# Patient Record
Sex: Female | Born: 1990
Health system: Southern US, Community
[De-identification: ages and names within clinical notes are randomized; demographics above are authoritative.]

## PROBLEM LIST (undated history)

## (undated) ENCOUNTER — Inpatient Hospital Stay (HOSPITAL_COMMUNITY): Payer: Self-pay

## (undated) ENCOUNTER — Ambulatory Visit (HOSPITAL_COMMUNITY): Admission: EM | Discharge status: Absent without leave | Payer: PRIVATE HEALTH INSURANCE

## (undated) DIAGNOSIS — F419 Anxiety disorder, unspecified: Secondary | ICD-10-CM

## (undated) DIAGNOSIS — R519 Headache, unspecified: Secondary | ICD-10-CM

## (undated) DIAGNOSIS — O1490 Unspecified pre-eclampsia, unspecified trimester: Secondary | ICD-10-CM

## (undated) DIAGNOSIS — K589 Irritable bowel syndrome without diarrhea: Secondary | ICD-10-CM

## (undated) DIAGNOSIS — F319 Bipolar disorder, unspecified: Secondary | ICD-10-CM

## (undated) DIAGNOSIS — F410 Panic disorder [episodic paroxysmal anxiety] without agoraphobia: Secondary | ICD-10-CM

## (undated) DIAGNOSIS — F32A Depression, unspecified: Secondary | ICD-10-CM

## (undated) DIAGNOSIS — R51 Headache: Secondary | ICD-10-CM

## (undated) DIAGNOSIS — F329 Major depressive disorder, single episode, unspecified: Secondary | ICD-10-CM

## (undated) DIAGNOSIS — F4310 Post-traumatic stress disorder, unspecified: Secondary | ICD-10-CM

## (undated) HISTORY — DX: Headache: R51

## (undated) HISTORY — DX: Bipolar disorder, unspecified: F31.9

## (undated) HISTORY — DX: Post-traumatic stress disorder, unspecified: F43.10

## (undated) HISTORY — DX: Headache, unspecified: R51.9

## (undated) HISTORY — PX: EYE SURGERY: SHX253

## (undated) HISTORY — PX: TENDON REPAIR: SHX5111

---
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
## 2011-02-27 ENCOUNTER — Other Ambulatory Visit (HOSPITAL_COMMUNITY): Payer: Self-pay | Admitting: Family Medicine

## 2011-02-27 ENCOUNTER — Ambulatory Visit (HOSPITAL_COMMUNITY)
Admission: RE | Admit: 2011-02-27 | Discharge: 2011-02-27 | Disposition: A | Discharge status: Home / Self Care | Payer: BC Managed Care – PPO | Source: Ambulatory Visit | Attending: Family Medicine | Admitting: Family Medicine

## 2011-02-27 DIAGNOSIS — X58XXXA Exposure to other specified factors, initial encounter: Secondary | ICD-10-CM | POA: Insufficient documentation

## 2011-02-27 DIAGNOSIS — S8000XA Contusion of unspecified knee, initial encounter: Secondary | ICD-10-CM

## 2011-02-27 DIAGNOSIS — M25569 Pain in unspecified knee: Secondary | ICD-10-CM | POA: Insufficient documentation

## 2011-02-27 DIAGNOSIS — IMO0002 Reserved for concepts with insufficient information to code with codable children: Secondary | ICD-10-CM

## 2011-06-07 LAB — URINALYSIS, ROUTINE W REFLEX MICROSCOPIC
Bilirubin Urine: NEGATIVE
Glucose, UA: NEGATIVE
Hgb urine dipstick: NEGATIVE
Ketones, ur: NEGATIVE
Nitrite: NEGATIVE
Protein, ur: NEGATIVE
Specific Gravity, Urine: 1.01
Urobilinogen, UA: 0.2
pH: 7

## 2011-06-07 LAB — BASIC METABOLIC PANEL
BUN: 8
CO2: 26
Calcium: 9.2
Chloride: 106
Creatinine, Ser: 0.73
Glucose, Bld: 93
Potassium: 3.7
Sodium: 135

## 2011-06-07 LAB — DIFFERENTIAL
Basophils Absolute: 0
Basophils Relative: 1
Eosinophils Absolute: 0.1
Eosinophils Relative: 1
Lymphocytes Relative: 40
Lymphs Abs: 2.7
Monocytes Absolute: 0.5
Monocytes Relative: 8
Neutro Abs: 3.3
Neutrophils Relative %: 50

## 2011-06-07 LAB — URINE MICROSCOPIC-ADD ON

## 2011-06-07 LAB — CBC
HCT: 39.7
Hemoglobin: 14.1
MCHC: 35.6
MCV: 86.6
Platelets: 193
RBC: 4.58
RDW: 12.5
WBC: 6.7

## 2011-06-07 LAB — PREGNANCY, URINE: Preg Test, Ur: NEGATIVE

## 2011-08-08 ENCOUNTER — Encounter (HOSPITAL_COMMUNITY): Payer: Self-pay | Admitting: *Deleted

## 2011-08-08 DIAGNOSIS — H33002 Unspecified retinal detachment with retinal break, left eye: Secondary | ICD-10-CM | POA: Insufficient documentation

## 2011-08-08 NOTE — H&P (Addendum)
PORTIA WISDOM is an 20 y.o. female.   Chief Complaint: VISUAL FIELD LOSS INFERONASAL OS, OVR THE LAST WEEK  HPI: HX OF HIGH MYOPIA, VISION LOSS OS, WITH LOCAL RETINAL DETACHMENT SUPEROTEMPORALLY OS  Past Medical History  Diagnosis Date  . Asthma   . IBS (irritable bowel syndrome)   . Anxiety     Past Surgical History  Procedure Date  . Tendon repair     History reviewed. No pertinent family history. Social History:  reports that she has never smoked. She does not have any smokeless tobacco history on file. She reports that she does not drink alcohol or use illicit drugs.  Allergies:  Allergies  Allergen Reactions  . Sulfa Antibiotics Swelling    No current facility-administered medications on file as of .   No current outpatient prescriptions on file as of .    No results found for this or any previous visit (from the past 48 hour(s)). No results found.  Review of Systems  Constitutional: Negative.   HENT: Negative.   Eyes: Negative.   Skin: Negative.   All other systems reviewed and are negative.    Last menstrual period 08/08/2011. Physical Exam  Eyes: Conjunctivae, EOM and lids are normal. Pupils are equal, round, and reactive to light.         20/20 OU   WITH SUPEROTEMPORAL LOCAL RD OS  Assessment/Plan REPAIR  OF RETINAL DETACHMENT OS, VIA SCLERAL BUCKLE AND CRYOPEXY UNDER GENERAL ANESTHESIA  Lawanna Cecere A 08/08/2011, 10:11 PM   Pt examined, and no changes

## 2011-08-09 ENCOUNTER — Ambulatory Visit (HOSPITAL_COMMUNITY): Payer: BC Managed Care – PPO | Admitting: Certified Registered"

## 2011-08-09 ENCOUNTER — Encounter (HOSPITAL_COMMUNITY): Payer: Self-pay | Admitting: Certified Registered"

## 2011-08-09 ENCOUNTER — Ambulatory Visit (HOSPITAL_COMMUNITY)
Admission: RE | Admit: 2011-08-09 | Discharge: 2011-08-09 | Disposition: A | Discharge status: Home / Self Care | Payer: BC Managed Care – PPO | Source: Ambulatory Visit | Attending: Ophthalmology | Admitting: Ophthalmology

## 2011-08-09 ENCOUNTER — Encounter (HOSPITAL_COMMUNITY): Payer: Self-pay | Admitting: *Deleted

## 2011-08-09 ENCOUNTER — Ambulatory Visit (HOSPITAL_COMMUNITY): Payer: BC Managed Care – PPO

## 2011-08-09 ENCOUNTER — Encounter (HOSPITAL_COMMUNITY)
Admission: RE | Disposition: A | Discharge status: Home / Self Care | Payer: Self-pay | Source: Ambulatory Visit | Attending: Ophthalmology

## 2011-08-09 DIAGNOSIS — H33009 Unspecified retinal detachment with retinal break, unspecified eye: Secondary | ICD-10-CM | POA: Insufficient documentation

## 2011-08-09 HISTORY — DX: Irritable bowel syndrome, unspecified: K58.9

## 2011-08-09 HISTORY — PX: SCLERAL BUCKLE: SHX5340

## 2011-08-09 HISTORY — DX: Anxiety disorder, unspecified: F41.9

## 2011-08-09 LAB — CBC
HCT: 39.4 % (ref 36.0–46.0)
Hemoglobin: 13.8 g/dL (ref 12.0–15.0)
MCH: 30.3 pg (ref 26.0–34.0)
MCHC: 35 g/dL (ref 30.0–36.0)
MCV: 86.6 fL (ref 78.0–100.0)
Platelets: 200 10*3/uL (ref 150–400)
RBC: 4.55 MIL/uL (ref 3.87–5.11)
RDW: 12.8 % (ref 11.5–15.5)
WBC: 6.6 10*3/uL (ref 4.0–10.5)

## 2011-08-09 LAB — BASIC METABOLIC PANEL
BUN: 7 mg/dL (ref 6–23)
CO2: 25 meq/L (ref 19–32)
Calcium: 9.7 mg/dL (ref 8.4–10.5)
Chloride: 103 meq/L (ref 96–112)
Creatinine, Ser: 0.65 mg/dL (ref 0.50–1.10)
GFR calc Af Amer: >90 mL/min (ref >90)
GFR calc non Af Amer: >90 mL/min (ref >90)
Glucose, Bld: 89 mg/dL (ref 70–99)
Potassium: 4.4 meq/L (ref 3.5–5.1)
Sodium: 139 mEq/L (ref 135–145)

## 2011-08-09 LAB — SURGICAL PCR SCREEN
MRSA, PCR: NEGATIVE
Staphylococcus aureus: NEGATIVE

## 2011-08-09 LAB — HCG, SERUM, QUALITATIVE: Preg, Serum: NEGATIVE

## 2011-08-09 SURGERY — SCLERAL BUCKLE
Anesthesia: General | Site: Eye | Laterality: Left | Wound class: Clean

## 2011-08-09 MED ORDER — GATIFLOXACIN 0.5 % OP SOLN: 1.0000 [drp] | Freq: Four times a day (QID) | OPHTHALMIC | Status: DC

## 2011-08-09 MED ORDER — GENTAMICIN SULFATE 40 MG/ML IJ SOLN
INTRAMUSCULAR | Status: DC | PRN
  Administered 2011-08-09: 80 mg

## 2011-08-09 MED ORDER — SODIUM CHLORIDE BACTERIOSTATIC 0.9 % IJ SOLN
INTRAMUSCULAR | Status: DC | PRN
  Administered 2011-08-09: 10 mL

## 2011-08-09 MED ORDER — ROCURONIUM BROMIDE 100 MG/10ML IV SOLN
INTRAVENOUS | Status: DC | PRN
  Administered 2011-08-09: 40 mg via INTRAVENOUS

## 2011-08-09 MED ORDER — GLYCOPYRROLATE 0.2 MG/ML IJ SOLN
INTRAMUSCULAR | Status: DC | PRN
  Administered 2011-08-09: .6 mg via INTRAVENOUS

## 2011-08-09 MED ORDER — FENTANYL CITRATE 0.05 MG/ML IJ SOLN
50.0000 ug | Freq: Once | INTRAMUSCULAR | Status: AC
  Administered 2011-08-09: 50 ug via INTRAVENOUS

## 2011-08-09 MED ORDER — CEFAZOLIN SODIUM 1-5 GM-% IV SOLN
INTRAVENOUS | Status: DC | PRN
  Administered 2011-08-09: 1 g via INTRAVENOUS

## 2011-08-09 MED ORDER — NEOSTIGMINE METHYLSULFATE 1 MG/ML IJ SOLN
INTRAMUSCULAR | Status: DC | PRN
  Administered 2011-08-09: 4 mg via INTRAVENOUS

## 2011-08-09 MED ORDER — CYCLOPENTOLATE HCL 1 % OP SOLN
OPHTHALMIC | Status: AC
  Filled 2011-08-09: qty 2

## 2011-08-09 MED ORDER — GATIFLOXACIN 0.5 % OP SOLN
OPHTHALMIC | Status: AC
  Administered 2011-08-09: 1 [drp] via OPHTHALMIC
  Filled 2011-08-09: qty 2.5

## 2011-08-09 MED ORDER — PHENYLEPHRINE HCL 2.5 % OP SOLN
1.0000 [drp] | OPHTHALMIC | Status: AC
  Administered 2011-08-09 (×3): 1 [drp] via OPHTHALMIC

## 2011-08-09 MED ORDER — ACETAMINOPHEN 325 MG PO TABS: 325.0000 mg | ORAL_TABLET | ORAL | Status: DC | PRN

## 2011-08-09 MED ORDER — POLYMYXIN B SULFATE 500000 UNITS IJ SOLR
INTRAMUSCULAR | Status: DC | PRN
  Administered 2011-08-09: 500000 [IU]

## 2011-08-09 MED ORDER — METOCLOPRAMIDE HCL 5 MG/ML IJ SOLN
10.0000 mg | Freq: Once | INTRAMUSCULAR | Status: AC
  Administered 2011-08-09: 10 mg via INTRAVENOUS
  Filled 2011-08-09: qty 2

## 2011-08-09 MED ORDER — HYDROMORPHONE HCL PF 1 MG/ML IJ SOLN
0.2500 mg | INTRAMUSCULAR | Status: DC | PRN
  Administered 2011-08-09 (×2): 0.5 mg via INTRAVENOUS

## 2011-08-09 MED ORDER — ONDANSETRON HCL 4 MG/2ML IJ SOLN
INTRAMUSCULAR | Status: AC
  Administered 2011-08-09: 4 mg via INTRAVENOUS
  Filled 2011-08-09: qty 2

## 2011-08-09 MED ORDER — DEXAMETHASONE SODIUM PHOSPHATE 10 MG/ML IJ SOLN
INTRAMUSCULAR | Status: DC | PRN
  Administered 2011-08-09: 10 mg

## 2011-08-09 MED ORDER — MUPIROCIN 2 % EX OINT
TOPICAL_OINTMENT | CUTANEOUS | Status: AC
  Administered 2011-08-09: 1 via NASAL
  Filled 2011-08-09: qty 22

## 2011-08-09 MED ORDER — SODIUM CHLORIDE 0.9 % IV SOLN
INTRAVENOUS | Status: DC | PRN
  Administered 2011-08-09 (×2): via INTRAVENOUS

## 2011-08-09 MED ORDER — MUPIROCIN 2 % EX OINT
TOPICAL_OINTMENT | CUTANEOUS | Status: AC
  Filled 2011-08-09: qty 22

## 2011-08-09 MED ORDER — PROPOFOL 10 MG/ML IV EMUL
INTRAVENOUS | Status: DC | PRN
  Administered 2011-08-09: 200 mg via INTRAVENOUS

## 2011-08-09 MED ORDER — GATIFLOXACIN 0.5 % OP SOLN
1.0000 [drp] | OPHTHALMIC | Status: AC
  Administered 2011-08-09 (×3): 1 [drp] via OPHTHALMIC

## 2011-08-09 MED ORDER — ONDANSETRON HCL 4 MG/2ML IJ SOLN
INTRAMUSCULAR | Status: DC | PRN
  Administered 2011-08-09: 4 mg via INTRAVENOUS

## 2011-08-09 MED ORDER — CYCLOPENTOLATE HCL 1 % OP SOLN
1.0000 [drp] | OPHTHALMIC | Status: AC
  Administered 2011-08-09 (×3): 1 [drp] via OPHTHALMIC

## 2011-08-09 MED ORDER — MIDAZOLAM HCL 5 MG/5ML IJ SOLN
INTRAMUSCULAR | Status: DC | PRN
  Administered 2011-08-09: 2 mg via INTRAVENOUS

## 2011-08-09 MED ORDER — SODIUM CHLORIDE 0.9 % IV SOLN
INTRAVENOUS | Status: DC
  Administered 2011-08-09: 14:00:00 via INTRAVENOUS

## 2011-08-09 MED ORDER — FENTANYL CITRATE 0.05 MG/ML IJ SOLN
INTRAMUSCULAR | Status: DC | PRN
  Administered 2011-08-09: 150 ug via INTRAVENOUS
  Administered 2011-08-09: 25 ug via INTRAVENOUS

## 2011-08-09 MED ORDER — PREDNISOLONE ACETATE 1 % OP SUSP: 1.0000 [drp] | Freq: Four times a day (QID) | OPHTHALMIC | Status: DC

## 2011-08-09 MED ORDER — HYDROMORPHONE HCL PF 1 MG/ML IJ SOLN
INTRAMUSCULAR | Status: AC
  Filled 2011-08-09: qty 1

## 2011-08-09 MED ORDER — BSS IO SOLN
INTRAOCULAR | Status: DC | PRN
  Administered 2011-08-09: 15 mL via INTRAOCULAR

## 2011-08-09 MED ORDER — HYPROMELLOSE (GONIOSCOPIC) 2.5 % OP SOLN
OPHTHALMIC | Status: DC | PRN
  Administered 2011-08-09: 1 [drp] via OPHTHALMIC

## 2011-08-09 MED ORDER — ONDANSETRON HCL 4 MG/2ML IJ SOLN
4.0000 mg | Freq: Once | INTRAMUSCULAR | Status: AC | PRN
  Administered 2011-08-09: 4 mg via INTRAVENOUS

## 2011-08-09 MED ORDER — CEFAZOLIN SODIUM 1-5 GM-% IV SOLN
INTRAVENOUS | Status: AC
  Filled 2011-08-09: qty 50

## 2011-08-09 MED ORDER — PHENYLEPHRINE HCL 2.5 % OP SOLN
OPHTHALMIC | Status: AC
  Administered 2011-08-09: 1 [drp] via OPHTHALMIC
  Filled 2011-08-09: qty 3

## 2011-08-09 MED ORDER — LACTATED RINGERS IV SOLN
INTRAVENOUS | Status: DC | PRN
  Administered 2011-08-09: 14:00:00 via INTRAVENOUS

## 2011-08-09 MED ORDER — MUPIROCIN 2 % EX OINT
TOPICAL_OINTMENT | Freq: Two times a day (BID) | CUTANEOUS | Status: DC
  Administered 2011-08-09: 1 via NASAL

## 2011-08-09 MED ORDER — KETOROLAC TROMETHAMINE 15 MG/ML IJ SOLN
15.0000 mg | Freq: Once | INTRAMUSCULAR | Status: AC
  Administered 2011-08-09: 15 mg via INTRAVENOUS

## 2011-08-09 MED ORDER — KETOROLAC TROMETHAMINE 30 MG/ML IJ SOLN
INTRAMUSCULAR | Status: AC
  Filled 2011-08-09: qty 1

## 2011-08-09 SURGICAL SUPPLY — 52 items
APPLICATOR COTTON TIP 6IN STRL (MISCELLANEOUS) ×2 IMPLANT
APPLICATOR DR MATTHEWS STRL (MISCELLANEOUS) ×2 IMPLANT
BAND SCLERAL BUCKLING TYPE 240 (Ophthalmic Related) ×2 IMPLANT
CANNULA ANT CHAM MAIN (OPHTHALMIC RELATED) IMPLANT
CORDS BIPOLAR (ELECTRODE) IMPLANT
COVER SURGICAL LIGHT HANDLE (MISCELLANEOUS) ×2 IMPLANT
DRAPE OPHTHALMIC 77X100 STRL (CUSTOM PROCEDURE TRAY) ×2 IMPLANT
FILTER BLUE MILLIPORE (MISCELLANEOUS) IMPLANT
FILTER STRAW FLUID ASPIR (MISCELLANEOUS) IMPLANT
GAS OPHTHALMIC (MISCELLANEOUS) IMPLANT
GLOVE SS BIOGEL STRL SZ 8 (GLOVE) ×1 IMPLANT
GLOVE SUPERSENSE BIOGEL SZ 8 (GLOVE) ×1
GOWN EXTRA PROTECTION XL (GOWNS) ×2 IMPLANT
GOWN STRL NON-REIN LRG LVL3 (GOWN DISPOSABLE) ×2 IMPLANT
KIT PERFLUORON PROCEDURE 5ML (MISCELLANEOUS) IMPLANT
KIT ROOM TURNOVER OR (KITS) ×2 IMPLANT
KNIFE CRESCENT 2.5 55 ANG (BLADE) IMPLANT
KNIFE GRIESHABER SHARP 2.5MM (MISCELLANEOUS) IMPLANT
MARKER SKIN DUAL TIP RULER LAB (MISCELLANEOUS) ×2 IMPLANT
MASK EYE SHIELD (GAUZE/BANDAGES/DRESSINGS) ×2 IMPLANT
NEEDLE 18GX1X1/2 (RX/OR ONLY) (NEEDLE) ×2 IMPLANT
NEEDLE 25GX 5/8IN NON SAFETY (NEEDLE) IMPLANT
NEEDLE HYPO 25GX1X1/2 BEV (NEEDLE) IMPLANT
NEEDLE HYPO 30X.5 LL (NEEDLE) IMPLANT
NS IRRIG 1000ML POUR BTL (IV SOLUTION) ×2 IMPLANT
PACK VITRECTOMY CUSTOM (CUSTOM PROCEDURE TRAY) IMPLANT
PACK VITRECTOMY PIC MCHSVP (PACKS) IMPLANT
PAD ARMBOARD 7.5X6 YLW CONV (MISCELLANEOUS) ×2 IMPLANT
PAD EYE OVAL STERILE LF (GAUZE/BANDAGES/DRESSINGS) ×2 IMPLANT
PROBE COHERENT CURVED (MISCELLANEOUS) IMPLANT
REPL STRA BRUSH NEEDLE (NEEDLE) IMPLANT
RESERVOIR BACK FLUSH (MISCELLANEOUS) IMPLANT
RETRACTOR IRIS FLEX 25G GRIESH (INSTRUMENTS) IMPLANT
ROLLS DENTAL (MISCELLANEOUS) IMPLANT
SET FLUID INJECTOR (SET/KITS/TRAYS/PACK) IMPLANT
SET VGFI TUBING 8065808002 (SET/KITS/TRAYS/PACK) IMPLANT
SLEEVE SCLERAL BUCK TYPE 70 (Ophthalmic Related) ×2 IMPLANT
SLEEVE SCLERAL TYPE 270 (Ophthalmic Related) IMPLANT
STOCKINETTE IMPERVIOUS 9X36 MD (GAUZE/BANDAGES/DRESSINGS) ×4 IMPLANT
STOPCOCK 4 WAY LG BORE MALE ST (IV SETS) IMPLANT
SUT MERSILENE 5 0 RD 1 DA (SUTURE) ×4 IMPLANT
SUT SILK 2 0 (SUTURE) ×2
SUT SILK 2-0 18XBRD TIE 12 (SUTURE) ×1 IMPLANT
SUT VICRYL 7 0 TG140 8 (SUTURE) IMPLANT
SYR 20ML ECCENTRIC (SYRINGE) ×2 IMPLANT
SYR 30ML SLIP (SYRINGE) IMPLANT
SYR 5ML LL (SYRINGE) IMPLANT
SYR TB 1ML LUER SLIP (SYRINGE) IMPLANT
TIRE 9 BICON SCLERAL TYPE 287 (Ophthalmic Related) ×2 IMPLANT
TOWEL OR 17X24 6PK STRL BLUE (TOWEL DISPOSABLE) ×4 IMPLANT
WATER STERILE IRR 1000ML POUR (IV SOLUTION) ×2 IMPLANT
WIPE INSTRUMENT VISIWIPE 73X73 (MISCELLANEOUS) IMPLANT

## 2011-08-09 NOTE — Interval H&P Note (Signed)
History and Physical Interval Note:  08/09/2011 3:52 PM  [name]  has presented today for surgery, with the diagnosis of RETINAL DETACHMENT LEFT EYE  The various methods of treatment have been discussed with the patient and family. After consideration of risks, benefits and other options for treatment, the patient has consented to  Procedure(s): SCLERAL BUCKLE as a surgical intervention .  The patients' history has been reviewed, patient examined, no change in status, stable for surgery.  I have reviewed the patients' chart and labs.  Questions were answered to the patient's satisfaction.     Fawn Kirk A

## 2011-08-09 NOTE — Brief Op Note (Signed)
08/09/2011  5:23 PM  PATIENT:  Kathy Zimmerman  20 y.o. female  PRE-OPERATIVE DIAGNOSIS:  RETINAL DETACHMENT LEFT EYE  POST-OPERATIVE DIAGNOSIS:  *SAME, MACULA ON, SUPERTEMPORAL, RHEGMATOGENOUS  PROCEDURE:  Procedure(s): SCLERAL BUCKLE WITH RETINAL CRYOPEXY ,  287, 240, 70 ELEMENTS SURGEON:  Surgeon(s): Angelisse Riso A Jatoria Kneeland  PHYSICIAN ASSISTANT:   ASSISTANTS: none   ANESTHESIA:   general  EBL:  0 BLOOD ADMINISTERED:none  DRAINS: none   LOCAL MEDICATIONS USED:  NONE  SPECIMEN:  No Specimen  DISPOSITION OF SPECIMEN:  N/A  COUNTS:  YES  TOURNIQUET:  * No tourniquets in log *  DICTATION: .Other Dictation: Dictation Number [id number]  PLAN OF CARE: Discharge to home after PACU  PATIENT DISPOSITION:  PACU - hemodynamically stable.   Delay start of Pharmacological VTE agent (>24hrs) due to surgical blood loss or risk of bleeding:  NOT APPLICABLE:[id number]

## 2011-08-09 NOTE — Progress Notes (Signed)
Spoke with Dr. Luciana Axe requarding  Orders for eye drops.Stated to use drops as written in [employer]

## 2011-08-09 NOTE — Anesthesia Postprocedure Evaluation (Signed)
  Anesthesia Post-op Note  Patient: Kathy Zimmerman  Procedure(s) Performed:  SCLERAL BUCKLE - with cryo  Patient Location: PACU  Anesthesia Type: General  Level of Consciousness: awake  Airway and Oxygen Therapy: Patient Spontanous Breathing  Post-op Pain: mild  Post-op Assessment: Post-op Vital signs reviewed, Patient's Cardiovascular Status Stable, Respiratory Function Stable, Patent Airway, NAUSEA AND VOMITING PRESENT, Adequate PO intake and Pain level controlled  Post-op Vital Signs: Reviewed and stable  Complications: No apparent anesthesia complications

## 2011-08-09 NOTE — Preoperative (Signed)
Beta Blockers   Reason not to administer Beta Blockers:Not Applicable 

## 2011-08-09 NOTE — Anesthesia Procedure Notes (Addendum)
Date/Time: 08/09/2011 4:10 PM Performed by: Julianne Rice K   Procedure Name: Intubation Date/Time: 08/09/2011 4:03 PM Performed by: Julianne Rice K Pre-anesthesia Checklist: Patient identified, Timeout performed, Emergency Drugs available, Suction available and Patient being monitored Patient Re-evaluated:Patient Re-evaluated prior to inductionOxygen Delivery Method: Circle System Utilized Intubation Type: IV induction Ventilation: Mask ventilation without difficulty Laryngoscope Size: Miller and 2 Grade View: Grade I Tube type: Oral Tube size: 7.5 mm Number of attempts: 1 Airway Equipment and Method: stylet Placement Confirmation: ETT inserted through vocal cords under direct vision,  breath sounds checked- equal and bilateral and positive ETCO2 Secured at: 21 cm Tube secured with: Tape Dental Injury: Teeth and Oropharynx as per pre-operative assessment

## 2011-08-09 NOTE — Discharge Instructions (Signed)
 Keep eye patch on eye until office visit

## 2011-08-09 NOTE — Transfer of Care (Signed)
Immediate Anesthesia Transfer of Care Note  Patient: Kathy Zimmerman  Procedure(s) Performed:  SCLERAL BUCKLE - with cryo  Patient Location: PACU  Anesthesia Type: General  Level of Consciousness: awake  Airway & Oxygen Therapy: Patient Spontanous Breathing and Patient connected to nasal cannula oxygen  Post-op Assessment: Report given to PACU RN and Post -op Vital signs reviewed and stable  Post vital signs: Reviewed and stable  Complications: No apparent anesthesia complications

## 2011-08-09 NOTE — Progress Notes (Signed)
Discharged home from PACU with mother. Nausea resolved. Marland Kitchen Discharge instructions given with full  Understanding.

## 2011-08-09 NOTE — Anesthesia Preprocedure Evaluation (Signed)
Anesthesia Evaluation  Patient identified by MRN, date of birth, ID band Patient awake    Reviewed: Allergy & Precautions, H&P , NPO status , Patient's Chart, lab work & pertinent test results  Airway Mallampati: I TM Distance: >3 FB Neck ROM: full    Dental  (+) Caps   Pulmonary asthma ,    Pulmonary exam normal       Cardiovascular Exercise Tolerance: Good regular Normal    Neuro/Psych Negative Neurological ROS  Negative Psych ROS   GI/Hepatic negative GI ROS, Neg liver ROS,   Endo/Other  Negative Endocrine ROS  Renal/GU negative Renal ROS  Genitourinary negative   Musculoskeletal   Abdominal   Peds  Hematology negative hematology ROS (+)   Anesthesia Other Findings   Reproductive/Obstetrics negative OB ROS                           Anesthesia Physical Anesthesia Plan  ASA: II  Anesthesia Plan: General   Post-op Pain Management:    Induction: Intravenous  Airway Management Planned: Oral ETT  Additional Equipment:   Intra-op Plan:   Post-operative Plan: Extubation in OR  Informed Consent: I have reviewed the patients History and Physical, chart, labs and discussed the procedure including the risks, benefits and alternatives for the proposed anesthesia with the patient or authorized representative who has indicated his/her understanding and acceptance.     Plan Discussed with: Anesthesiologist, CRNA and Surgeon  Anesthesia Plan Comments:         Anesthesia Quick Evaluation

## 2011-08-10 MED FILL — Mupirocin Oint 2%: CUTANEOUS | Qty: 22 | Status: AC

## 2011-08-10 NOTE — Op Note (Signed)
Kathy Zimmerman, Kathy Zimmerman                ACCOUNT NO.:  [id number]  MEDICAL RECORD NO.:  [id number]  LOCATION:  [facility]                         FACILITY:  [facility]  PHYSICIAN:  Jillyn Hidden A. Ridhaan Dreibelbis, M.D.   DATE OF BIRTH:  1990-11-06  DATE OF PROCEDURE:  08/09/2011 DATE OF DISCHARGE:  08/09/2011                              OPERATIVE REPORT   PREOPERATIVE DIAGNOSIS:  Rhegmatogenous retinal detachment, left eye - macula on.  POSTOPERATIVE DIAGNOSIS:  Rhegmatogenous retinal detachment, left eye - macula on.  PROCEDURES:  Repair of rhegmatogenous retinal detachment via scleral buckle and retinal cryopexy, left eye.  SURGEON:  Alford Highland. Carling Liberman, MD.  ANESTHESIA:  General endotracheal anesthesia.  INDICATIONS FOR PROCEDURE:  The patient is a 20 year old woman who has a history of atrophic retinal holes superotemporal, treated 7 years ago, and subsequently had some moderate blunt trauma type injuries to the head and neck of a non major extent, but with a jolting type and has developed in the last week peripheral vision loss inferonasal in the left eye.  She was found to have rhegmatogenous detachment __________ with multiple retinal breaks on the superotemporal quadrants 2 and 3 o'clock.  The patient and family understand that this is an attempt to reattach the retina.  She understands the risks of anesthesia including rare occurrence of death, loss of eye including, but not limited to hemorrhage, infection, scarring, need for another surgery, no change in vision, loss of vision, progressive disease.  After appropriate signed consent was obtained, the patient was taken to the operating room.  In the operating room, appropriate monitors followed by mild sedation.  Appropriate proper site and selection was confirmed with the operating room.  Thereafter, left periocular region was sterilely prepped and draped in the usual ophthalmic fashion.  Lid speculum was applied.  Conjunctival peritomy was then  fashioned 360 degrees.  Rectus muscles isolated on 2-0 silk ties.  Retinopexy was then carried out using indirect ophthalmoscopy with visualization and multiple atrophic holes were found between the 2 and 2:30 meridians on the left eye far periphery  and these were treated.  A low lying retinal detachment was noted.  Detachment was anterior to the equator and between 2 and 3 o'clock.  At this time, a 287 solid silicone explant was selected and placed underneath the lateral rectus muscles and secured with a 240 encircling band and 70 sleeve.  70 sleeve was secured in the inferonasal quadrant. 5-0 Mersilene mattress sutures were placed in superonasal quadrant, one each in the remaining quadrants.  The buckle was then tied initially temporarily and then again with depression of the sclera over the buckle and excellent __________ was obtained.  No complications occurred. Buckles were irrigated with bug juice.  The ends of the band were trimmed.  Conjunctiva was then brought forward.  Tenon was closed to the lateral, superior, and inferior rectus with 7-0 Vicryl sutures, and thereafter, the conjunctiva was then brought forward and closed with limbus with 7-0 Vicryl suture. Wounds were secured.  Irrigation carried out.  Subconjunctival Decadron applied.  Sterile patch and Fox shield were applied.  Intraocular pressure was assessed and found to be adequate.  The patient was taken  to the PACU in good, stable condition.     Alford Highland Shahidah Nesbitt, M.D.     GAR/MEDQ  D:  08/09/2011  T:  08/10/2011  Job:  [id number]

## 2011-08-11 ENCOUNTER — Encounter (HOSPITAL_COMMUNITY): Payer: Self-pay | Admitting: Ophthalmology

## 2012-04-01 ENCOUNTER — Other Ambulatory Visit (HOSPITAL_COMMUNITY): Payer: Self-pay | Admitting: Family Medicine

## 2012-04-01 ENCOUNTER — Ambulatory Visit (HOSPITAL_COMMUNITY)
Admission: RE | Admit: 2012-04-01 | Discharge: 2012-04-01 | Disposition: A | Discharge status: Home / Self Care | Payer: BC Managed Care – PPO | Source: Ambulatory Visit | Attending: Family Medicine | Admitting: Family Medicine

## 2012-04-01 DIAGNOSIS — S93409A Sprain of unspecified ligament of unspecified ankle, initial encounter: Secondary | ICD-10-CM

## 2012-04-01 DIAGNOSIS — X500XXA Overexertion from strenuous movement or load, initial encounter: Secondary | ICD-10-CM | POA: Insufficient documentation

## 2012-06-26 ENCOUNTER — Other Ambulatory Visit (HOSPITAL_COMMUNITY): Payer: Self-pay | Admitting: Family Medicine

## 2012-06-26 ENCOUNTER — Ambulatory Visit (HOSPITAL_COMMUNITY)
Admission: RE | Admit: 2012-06-26 | Discharge: 2012-06-26 | Disposition: A | Discharge status: Home / Self Care | Payer: BC Managed Care – PPO | Source: Ambulatory Visit | Attending: Family Medicine | Admitting: Family Medicine

## 2012-06-26 DIAGNOSIS — R51 Headache: Secondary | ICD-10-CM

## 2013-05-09 ENCOUNTER — Encounter: Payer: Self-pay | Admitting: Family Medicine

## 2013-05-09 ENCOUNTER — Ambulatory Visit (INDEPENDENT_AMBULATORY_CARE_PROVIDER_SITE_OTHER): Payer: BC Managed Care – PPO | Admitting: Family Medicine

## 2013-05-09 VITALS — BP 130/90 | Temp 98.5°F | Ht 66.0 in | Wt 151.6 lb

## 2013-05-09 DIAGNOSIS — N912 Amenorrhea, unspecified: Secondary | ICD-10-CM

## 2013-05-09 DIAGNOSIS — R197 Diarrhea, unspecified: Secondary | ICD-10-CM

## 2013-05-09 MED ORDER — METRONIDAZOLE 500 MG PO TABS: 500.0000 mg | ORAL_TABLET | Freq: Two times a day (BID) | ORAL | Status: DC

## 2013-05-09 MED ORDER — CIPROFLOXACIN HCL 500 MG PO TABS: 500.0000 mg | ORAL_TABLET | Freq: Two times a day (BID) | ORAL | Status: AC

## 2013-05-09 NOTE — Progress Notes (Signed)
  Subjective:    Patient ID: Kathy Zimmerman, female    DOB: Sep 26, 1990, 22 y.o.   MRN: [id number]  Abdominal Pain This is a new problem. The current episode started in the past 7 days. The onset quality is sudden. The problem occurs constantly. The problem has been gradually worsening. The pain is located in the epigastric region. The pain is mild. The quality of the pain is cramping. The abdominal pain does not radiate. Associated symptoms include diarrhea and nausea. The pain is aggravated by eating. The pain is relieved by bowel movements. She has tried nothing for the symptoms.   has never had this problem before. EGD- [place] Moderate nausea, no vomiting Tried- bentyl did not help No recent antibiotics,  Did travel to [place] at the time of all this began Family history noncontributory Social recently married  Review of Systems  Gastrointestinal: Positive for nausea, abdominal pain and diarrhea.       Objective:   Physical Exam Lungs clear no crackles heart is regular no murmurs pulses normal abdomen is soft no guarding rebound or tenderness. Extremities no edema       Assessment & Plan:  #1 diarrhea with recent travel to [place] for symptoms began after that I believe that this is a sign that this patient does in fact have an infection she picked up there she denies any questionable sources water but she could have been exposed through food or through ice. We will do stool specimens and culture plus also Flagyl 3 times a day for weeks Cipro twice a day for a week if persistent troubles next step is gastroenterology referral for sigmoidoscopy with biopsies

## 2013-05-09 NOTE — Patient Instructions (Signed)
Probiotic one daily

## 2013-05-14 ENCOUNTER — Other Ambulatory Visit: Payer: Self-pay | Admitting: Family Medicine

## 2013-05-15 LAB — OVA AND PARASITE EXAMINATION: OP: NONE SEEN

## 2013-05-15 LAB — CLOSTRIDIUM DIFFICILE EIA: CDIFTX: NEGATIVE

## 2013-05-18 LAB — STOOL CULTURE

## 2013-09-16 ENCOUNTER — Encounter: Payer: Self-pay | Admitting: Family Medicine

## 2013-09-16 ENCOUNTER — Ambulatory Visit (INDEPENDENT_AMBULATORY_CARE_PROVIDER_SITE_OTHER): Payer: 59 | Admitting: Family Medicine

## 2013-09-16 VITALS — BP 124/88 | Temp 98.7°F | Ht 66.0 in | Wt 154.0 lb

## 2013-09-16 DIAGNOSIS — J111 Influenza due to unidentified influenza virus with other respiratory manifestations: Secondary | ICD-10-CM

## 2013-09-16 MED ORDER — BENZONATATE 100 MG PO CAPS: 100.0000 mg | ORAL_CAPSULE | Freq: Four times a day (QID) | ORAL | Status: DC | PRN

## 2013-09-16 MED ORDER — OSELTAMIVIR PHOSPHATE 75 MG PO CAPS: 75.0000 mg | ORAL_CAPSULE | Freq: Two times a day (BID) | ORAL | Status: DC

## 2013-09-16 NOTE — Patient Instructions (Signed)

## 2013-09-16 NOTE — Progress Notes (Signed)
   Subjective:    Patient ID: Kathy Zimmerman, female    DOB: 22-May-1991, 22 y.o.   MRN: [id number]  Cough This is a new problem. The current episode started 1 to 4 weeks ago. Associated symptoms include a fever, headaches, myalgias and rhinorrhea. Pertinent negatives include no chest pain, ear pain, shortness of breath or wheezing. Associated symptoms comments: Runny nose, abdominal pain, vomiting. Treatments tried: tylenol. The treatment provided no relief.   patient with cold-like symptoms that occurred last month got a little bit better while she is in [place] then when she came back she noticed a little bit head congestion but today she's noticed body aches headaches fever chills myalgias and weakness flulike illness today. PMH benign.    Review of Systems  Constitutional: Positive for fever. Negative for activity change.  HENT: Positive for congestion and rhinorrhea. Negative for ear pain.   Eyes: Negative for discharge.  Respiratory: Positive for cough. Negative for shortness of breath and wheezing.   Cardiovascular: Negative for chest pain.  Musculoskeletal: Positive for myalgias.  Neurological: Positive for headaches.       Objective:   Physical Exam  Nursing note and vitals reviewed. Constitutional: She appears well-developed.  HENT:  Head: Normocephalic.  Nose: Nose normal.  Mouth/Throat: Oropharynx is clear and moist. No oropharyngeal exudate.  Neck: Neck supple.  Cardiovascular: Normal rate and normal heart sounds.   No murmur heard. Pulmonary/Chest: Effort normal and breath sounds normal. She has no wheezes.  Lymphadenopathy:    She has no cervical adenopathy.  Skin: Skin is warm and dry.          Assessment & Plan:  Even though she's had a recent viral syndrome she seemingly got better and then it got worse just recently and I believe that this is consistent with the flu Tamiflu and Tessalon prescribed warning signs discussed followup if ongoing trouble

## 2013-12-25 ENCOUNTER — Encounter: Payer: Self-pay | Admitting: Family Medicine

## 2013-12-25 ENCOUNTER — Ambulatory Visit (INDEPENDENT_AMBULATORY_CARE_PROVIDER_SITE_OTHER): Payer: BC Managed Care – PPO | Admitting: Nurse Practitioner

## 2013-12-25 ENCOUNTER — Encounter: Payer: Self-pay | Admitting: Nurse Practitioner

## 2013-12-25 VITALS — BP 118/80 | Temp 98.7°F | Ht 66.0 in | Wt 154.0 lb

## 2013-12-25 DIAGNOSIS — J029 Acute pharyngitis, unspecified: Secondary | ICD-10-CM

## 2013-12-25 MED ORDER — CEFPROZIL 500 MG PO TABS: 500.0000 mg | ORAL_TABLET | Freq: Two times a day (BID) | ORAL | Status: DC

## 2013-12-26 ENCOUNTER — Encounter: Payer: Self-pay | Admitting: Nurse Practitioner

## 2013-12-26 LAB — STREP A DNA PROBE: GASP: POSITIVE

## 2013-12-26 NOTE — Progress Notes (Signed)
Subjective:  Presents for complaints of fever, swollen lymph nodes, sore throat that began 2 days ago. Temp last night 102.4. Generalized headache. Myalgias. No vomiting, diarrhea or abdominal pain. Slightly better today. No rash. Drinking minimal fluids. Voiding nl. Swallowing saliva without difficulty.  Objective:   BP 118/80  Temp(Src) 98.7 F (37.1 C)  Ht 5\' 6"  (1.676 m)  Wt 154 lb (69.854 kg)  BMI 24.87 kg/m2 NAD. Alert, oriented. TMs minimal clear effusion. Pharynx mild erythema on posterior soft palate. No lesions or exudate. Neck supple with mild soft anterior adenopathy. Lungs clear. Heart RRR. abd soft, nontender without obvious organomegaly.   Assessment: Acute pharyngitis - Plan: Strep A DNA probe  Plan:  Meds ordered this encounter  Medications  . cefPROZIL (CEFZIL) 500 MG tablet    Sig: Take 1 tablet (500 mg total) by mouth 2 (two) times daily.    Dispense:  20 tablet    Refill:  0    Order Specific Question:  Supervising Provider    Answer:  Mikey Kirschner [[id number]]  Warning signs reviewed. Defers Rocephin injection. Increase clear fluid intake. Call back in 3-4 days if no improvement, sooner if worse.

## 2014-12-02 ENCOUNTER — Other Ambulatory Visit (HOSPITAL_COMMUNITY)
Admission: RE | Admit: 2014-12-02 | Discharge: 2014-12-02 | Disposition: A | Discharge status: Home / Self Care | Payer: BLUE CROSS/BLUE SHIELD | Source: Ambulatory Visit | Attending: Obstetrics and Gynecology | Admitting: Obstetrics and Gynecology

## 2014-12-02 ENCOUNTER — Other Ambulatory Visit: Payer: Self-pay | Admitting: Obstetrics and Gynecology

## 2014-12-02 DIAGNOSIS — Z01419 Encounter for gynecological examination (general) (routine) without abnormal findings: Secondary | ICD-10-CM | POA: Diagnosis present

## 2014-12-03 LAB — CYTOLOGY - PAP

## 2014-12-22 ENCOUNTER — Ambulatory Visit (HOSPITAL_COMMUNITY): Payer: BLUE CROSS/BLUE SHIELD

## 2015-04-07 ENCOUNTER — Inpatient Hospital Stay (HOSPITAL_COMMUNITY): Payer: BLUE CROSS/BLUE SHIELD

## 2015-04-07 ENCOUNTER — Encounter (HOSPITAL_COMMUNITY): Payer: Self-pay | Admitting: *Deleted

## 2015-04-07 ENCOUNTER — Telehealth: Payer: Self-pay | Admitting: Obstetrics and Gynecology

## 2015-04-07 ENCOUNTER — Other Ambulatory Visit: Payer: Self-pay | Admitting: Obstetrics and Gynecology

## 2015-04-07 ENCOUNTER — Inpatient Hospital Stay (HOSPITAL_COMMUNITY)
Admission: AD | Admit: 2015-04-07 | Discharge: 2015-04-13 | DRG: 766 | Disposition: A | Discharge status: Home / Self Care | Payer: BLUE CROSS/BLUE SHIELD | Source: Ambulatory Visit | Attending: Obstetrics & Gynecology | Admitting: Obstetrics & Gynecology

## 2015-04-07 DIAGNOSIS — R030 Elevated blood-pressure reading, without diagnosis of hypertension: Secondary | ICD-10-CM | POA: Diagnosis not present

## 2015-04-07 DIAGNOSIS — Z3A29 29 weeks gestation of pregnancy: Secondary | ICD-10-CM | POA: Diagnosis present

## 2015-04-07 DIAGNOSIS — O99344 Other mental disorders complicating childbirth: Secondary | ICD-10-CM | POA: Diagnosis present

## 2015-04-07 DIAGNOSIS — Z98891 History of uterine scar from previous surgery: Secondary | ICD-10-CM

## 2015-04-07 DIAGNOSIS — F419 Anxiety disorder, unspecified: Secondary | ICD-10-CM | POA: Diagnosis present

## 2015-04-07 DIAGNOSIS — K589 Irritable bowel syndrome without diarrhea: Secondary | ICD-10-CM | POA: Diagnosis present

## 2015-04-07 DIAGNOSIS — O321XX0 Maternal care for breech presentation, not applicable or unspecified: Secondary | ICD-10-CM | POA: Diagnosis present

## 2015-04-07 DIAGNOSIS — IMO0002 Reserved for concepts with insufficient information to code with codable children: Secondary | ICD-10-CM

## 2015-04-07 DIAGNOSIS — Z6791 Unspecified blood type, Rh negative: Secondary | ICD-10-CM

## 2015-04-07 DIAGNOSIS — O26893 Other specified pregnancy related conditions, third trimester: Secondary | ICD-10-CM | POA: Diagnosis present

## 2015-04-07 DIAGNOSIS — O1490 Unspecified pre-eclampsia, unspecified trimester: Secondary | ICD-10-CM | POA: Diagnosis present

## 2015-04-07 DIAGNOSIS — O9952 Diseases of the respiratory system complicating childbirth: Secondary | ICD-10-CM | POA: Diagnosis present

## 2015-04-07 DIAGNOSIS — O9962 Diseases of the digestive system complicating childbirth: Secondary | ICD-10-CM | POA: Diagnosis present

## 2015-04-07 DIAGNOSIS — O1413 Severe pre-eclampsia, third trimester: Principal | ICD-10-CM | POA: Diagnosis present

## 2015-04-07 DIAGNOSIS — O1400 Mild to moderate pre-eclampsia, unspecified trimester: Secondary | ICD-10-CM | POA: Diagnosis present

## 2015-04-07 DIAGNOSIS — J45909 Unspecified asthma, uncomplicated: Secondary | ICD-10-CM | POA: Diagnosis present

## 2015-04-07 DIAGNOSIS — O365930 Maternal care for other known or suspected poor fetal growth, third trimester, not applicable or unspecified: Secondary | ICD-10-CM

## 2015-04-07 LAB — CBC
HCT: 37.2 % (ref 36.0–46.0)
Hemoglobin: 13.1 g/dL (ref 12.0–15.0)
MCH: 31.6 pg (ref 26.0–34.0)
MCHC: 35.2 g/dL (ref 30.0–36.0)
MCV: 89.9 fL (ref 78.0–100.0)
Platelets: 164 10*3/uL (ref 150–400)
RBC: 4.14 MIL/uL (ref 3.87–5.11)
RDW: 12.8 % (ref 11.5–15.5)
WBC: 11.3 10*3/uL — ABNORMAL HIGH (ref 4.0–10.5)

## 2015-04-07 LAB — URINALYSIS, ROUTINE W REFLEX MICROSCOPIC
Bilirubin Urine: NEGATIVE
Glucose, UA: NEGATIVE mg/dL
Hgb urine dipstick: NEGATIVE
Ketones, ur: NEGATIVE mg/dL
Leukocytes, UA: NEGATIVE
Nitrite: NEGATIVE
Protein, ur: NEGATIVE mg/dL
Specific Gravity, Urine: <1.005 — ABNORMAL LOW (ref 1.005–1.030)
Urobilinogen, UA: 0.2 mg/dL (ref 0.0–1.0)
pH: 5.5 (ref 5.0–8.0)

## 2015-04-07 LAB — COMPREHENSIVE METABOLIC PANEL
ALT: 20 U/L (ref 14–54)
AST: 23 U/L (ref 15–41)
Albumin: 3.5 g/dL (ref 3.5–5.0)
Alkaline Phosphatase: 142 U/L — ABNORMAL HIGH (ref 38–126)
Anion gap: 6 (ref 5–15)
BUN: 10 mg/dL (ref 6–20)
CO2: 21 mmol/L — ABNORMAL LOW (ref 22–32)
Calcium: 9 mg/dL (ref 8.9–10.3)
Chloride: 106 mmol/L (ref 101–111)
Creatinine, Ser: 0.65 mg/dL (ref 0.44–1.00)
GFR calc Af Amer: >60 mL/min (ref >60)
GFR calc non Af Amer: >60 mL/min (ref >60)
Glucose, Bld: 78 mg/dL (ref 65–99)
Potassium: 3.9 mmol/L (ref 3.5–5.1)
Sodium: 133 mmol/L — ABNORMAL LOW (ref 135–145)
Total Bilirubin: 0.4 mg/dL (ref 0.3–1.2)
Total Protein: 7.5 g/dL (ref 6.5–8.1)

## 2015-04-07 LAB — PROTEIN / CREATININE RATIO, URINE
Creatinine, Urine: 20 mg/dL
Protein Creatinine Ratio: 0.95 mg/mg{Cre} — ABNORMAL HIGH (ref 0.00–0.15)
Total Protein, Urine: 19 mg/dL

## 2015-04-07 LAB — URIC ACID: Uric Acid, Serum: 7.6 mg/dL — ABNORMAL HIGH (ref 2.3–6.6)

## 2015-04-07 LAB — LACTATE DEHYDROGENASE: LDH: 157 U/L (ref 98–192)

## 2015-04-07 MED ORDER — SODIUM CHLORIDE 0.9 % IJ SOLN
3.0000 mL | Freq: Two times a day (BID) | INTRAMUSCULAR | Status: DC
  Administered 2015-04-08 – 2015-04-09 (×3): 3 mL via INTRAVENOUS

## 2015-04-07 MED ORDER — HYDRALAZINE HCL 20 MG/ML IJ SOLN: 10.0000 mg | Freq: Once | INTRAMUSCULAR | Status: AC | PRN

## 2015-04-07 MED ORDER — DOCUSATE SODIUM 100 MG PO CAPS
100.0000 mg | ORAL_CAPSULE | Freq: Every day | ORAL | Status: DC
  Administered 2015-04-08 – 2015-04-10 (×3): 100 mg via ORAL
  Filled 2015-04-07 (×3): qty 1

## 2015-04-07 MED ORDER — SODIUM CHLORIDE 0.9 % IV SOLN: 250.0000 mL | INTRAVENOUS | Status: DC | PRN

## 2015-04-07 MED ORDER — ZOLPIDEM TARTRATE 5 MG PO TABS
5.0000 mg | ORAL_TABLET | Freq: Every evening | ORAL | Status: DC | PRN
  Administered 2015-04-09: 5 mg via ORAL
  Filled 2015-04-07: qty 1

## 2015-04-07 MED ORDER — ACETAMINOPHEN 325 MG PO TABS
650.0000 mg | ORAL_TABLET | ORAL | Status: DC | PRN
  Administered 2015-04-08 – 2015-04-09 (×3): 650 mg via ORAL
  Filled 2015-04-07 (×3): qty 2

## 2015-04-07 MED ORDER — SODIUM CHLORIDE 0.9 % IJ SOLN: 3.0000 mL | INTRAMUSCULAR | Status: DC | PRN

## 2015-04-07 MED ORDER — LABETALOL HCL 5 MG/ML IV SOLN: 20.0000 mg | INTRAVENOUS | Status: DC | PRN

## 2015-04-07 MED ORDER — PRENATAL MULTIVITAMIN CH
1.0000 | ORAL_TABLET | Freq: Every day | ORAL | Status: DC
  Administered 2015-04-08 – 2015-04-09 (×2): 1 via ORAL
  Filled 2015-04-07 (×2): qty 1

## 2015-04-07 MED ORDER — CALCIUM CARBONATE ANTACID 500 MG PO CHEW: 2.0000 | CHEWABLE_TABLET | ORAL | Status: DC | PRN

## 2015-04-07 MED ORDER — BETAMETHASONE SOD PHOS & ACET 6 (3-3) MG/ML IJ SUSP
12.0000 mg | INTRAMUSCULAR | Status: AC
  Administered 2015-04-07 – 2015-04-08 (×2): 12 mg via INTRAMUSCULAR
  Filled 2015-04-07 (×2): qty 2

## 2015-04-07 NOTE — MAU Provider Note (Signed)
  History     CSN: [id number]  Arrival date and time: 04/07/15 1746   First Provider Initiated Contact with Patient 04/07/15 1920      Chief Complaint  Patient presents with  . Hypertension   HPI Kathy Zimmerman 24 y.o. G1P0 @[redacted]w[redacted]d  presents to MAU with elevated blood pressure presents after being seen in clinic.  She is here for labs and eval.  She had a HA earlier today but none now.  No vision changes noticed.  Denies epigastric pain.  She feels good fetal movement and has occasional cramping.  Denies LOF, VB, dysuria.   OB History    Gravida Para Term Preterm AB TAB SAB Ectopic Multiple Living   1               Past Medical History  Diagnosis Date  . Asthma   . IBS (irritable bowel syndrome)   . Anxiety     Past Surgical History  Procedure Laterality Date  . Tendon repair    . Scleral buckle  08/09/2011    Procedure: SCLERAL BUCKLE;  Surgeon: Clent Demark Rankin;  Location: [facility] OR;  Service: Ophthalmology;  Laterality: Left;  with cryo    History reviewed. No pertinent family history.  History  Substance Use Topics  . Smoking status: Never Smoker   . Smokeless tobacco: Not on file  . Alcohol Use: No    Allergies:  Allergies  Allergen Reactions  . Sulfa Antibiotics Swelling    Prescriptions prior to admission  Medication Sig Dispense Refill Last Dose  . cefPROZIL (CEFZIL) 500 MG tablet Take 1 tablet (500 mg total) by mouth 2 (two) times daily. 20 tablet 0     ROS Pertinent ROS in HPI.  All other systems are negative.   Physical Exam   Blood pressure 150/109, pulse 85, temperature 98.9 F (37.2 C), resp. rate 18, height 5\' 6"  (1.676 m), weight 164 lb 12.8 oz (74.753 kg).  Physical Exam  Constitutional: She is oriented to person, place, and time. She appears well-developed and well-nourished.  HENT:  Head: Atraumatic.  Eyes: EOM are normal.  Neck: Normal range of motion.  Cardiovascular: Normal rate.   Respiratory: Effort normal. No respiratory distress.   GI: Soft. She exhibits no distension.  Musculoskeletal: Normal range of motion.  Neurological: She is alert and oriented to person, place, and time.  Skin: Skin is warm and dry.  Psychiatric: She has a normal mood and affect.    MAU Course  Procedures  MDM Labs ordered per MD MD to bedside.   Report given and pt care turned over to Marcille Buffy, CNM.    Assessment and Plan   MD has seen the patient, and she is admitted to antenatal  Mathis Bud  Jaclyn Prime, Collene Leyden 04/07/2015, 7:23 PM

## 2015-04-07 NOTE — Telephone Encounter (Signed)
TC from patient of [facility]--28 weeks, occasional palpitations.  Has had in past.  Seen at office today with elevated BP.  Labs done, plan for f/u on Friday.  Denies chest pain or significant SOB.  No cough. Notes sx make her anxious, which increases the sx.  Discussed issues--recommended she f/u with [facility] in am for recheck of status and evaluation of sx. To call with any worsening of sx or any other questions.  Donnel Saxon, CNM 04/06/15 10p

## 2015-04-07 NOTE — H&P (Signed)
Kathy Zimmerman is a 24 y.o. female  At 29 wks and 0 days by LMP confirmed by 8 wk u/s with EDD 06/23/2015. Patient recieves prenatal care with Dr. Thurnell Lose at [facility]. She was seen in the office by Dr. Simona Huh yestterday and was noted to have mildly elevated bp's. [place] labs were ordered along with a PCR. [facility] labs were negative. Her PCR in the office was 536. She called today complaining of headache and was asked by Dr. Simona Huh to present to MAU for evaluation. On arrival to [place] her bp was 160's/100's. She denied headache upon arrival. No new visual disturbances. She reports that she sees floaters even when she is not pregnant due to h/o detached retina. She denies RUQ pain. + FM no contractions no vaginal bleeding.    History OB History    Gravida Para Term Preterm AB TAB SAB Ectopic Multiple Living   1              Past Medical History  Diagnosis Date  . Asthma   . IBS (irritable bowel syndrome)   . Anxiety    Past Surgical History  Procedure Laterality Date  . Tendon repair    . Scleral buckle  08/09/2011    Procedure: SCLERAL BUCKLE;  Surgeon: Clent Demark Rankin;  Location: [facility];  Service: Ophthalmology;  Laterality: Left;  with cryo   Family History: family history is not on file. Social History:  reports that she has never smoked. She does not have any smokeless tobacco history on file. She reports that she does not drink alcohol or use illicit drugs.    Review of Systems  Constitutional: Negative.   HENT: Negative.   Eyes: Negative.   Respiratory: Negative.   Cardiovascular: Negative.   Gastrointestinal: Negative.   Genitourinary: Negative.   Musculoskeletal: Negative.   Skin: Negative.   Neurological: Negative.  Negative for headaches.  Endo/Heme/Allergies: Negative.   Psychiatric/Behavioral: Negative.       Blood pressure 129/75, pulse 72, temperature 98.8 F (37.1 C), temperature source Oral, resp. rate 16, height 5\' 6"  (1.676 m), weight 74.753 kg (164 lb  12.8 oz). Maternal Exam:  Abdomen: Patient reports no abdominal tenderness.   Physical Exam  Vitals reviewed. Constitutional: She is oriented to person, place, and time. She appears well-developed and well-nourished.  HENT:  Head: Normocephalic and atraumatic.  Eyes: No scleral icterus.  Neck: Normal range of motion. Neck supple.  Cardiovascular: Normal rate and regular rhythm.   Respiratory: Effort normal and breath sounds normal.  GI: There is no tenderness. There is no rebound and no guarding.  Genitourinary:  deferred  Musculoskeletal: Normal range of motion. She exhibits no edema.  Neurological: She is alert and oriented to person, place, and time. She has normal reflexes.  Skin: Skin is warm and dry.  Psychiatric: She has a normal mood and affect.    Prenatal labs: ABO, Rh: --/--/A NEG (07/27 2130) Antibody: POS (07/27 2130) Rubella:   RPR:   Nonreactive  HBsAg:   Negative  HIV:   Nonreactive  GBS:    Results for orders placed or performed during the hospital encounter of 04/07/15 (from the past 24 hour(s))  Urinalysis, Routine w reflex microscopic (not at [facility])     Status: Abnormal   Collection Time: 04/07/15  6:30 PM  Result Value Ref Range   Color, Urine STRAW (A) YELLOW   APPearance CLEAR CLEAR   Specific Gravity, Urine <1.005 (L) 1.005 - 1.030  pH 5.5 5.0 - 8.0   Glucose, UA NEGATIVE NEGATIVE mg/dL   Hgb urine dipstick NEGATIVE NEGATIVE   Bilirubin Urine NEGATIVE NEGATIVE   Ketones, ur NEGATIVE NEGATIVE mg/dL   Protein, ur NEGATIVE NEGATIVE mg/dL   Urobilinogen, UA 0.2 0.0 - 1.0 mg/dL   Nitrite NEGATIVE NEGATIVE   Leukocytes, UA NEGATIVE NEGATIVE  Protein / creatinine ratio, urine     Status: Abnormal   Collection Time: 04/07/15  6:30 PM  Result Value Ref Range   Creatinine, Urine 20.00 mg/dL   Total Protein, Urine 19 mg/dL   Protein Creatinine Ratio 0.95 (H) 0.00 - 0.15 mg/mg[Cre]  CBC     Status: Abnormal   Collection Time: 04/07/15  7:40 PM  Result  Value Ref Range   WBC 11.3 (H) 4.0 - 10.5 K/uL   RBC 4.14 3.87 - 5.11 MIL/uL   Hemoglobin 13.1 12.0 - 15.0 g/dL   HCT 37.2 36.0 - 46.0 %   MCV 89.9 78.0 - 100.0 fL   MCH 31.6 26.0 - 34.0 pg   MCHC 35.2 30.0 - 36.0 g/dL   RDW 12.8 11.5 - 15.5 %   Platelets 164 150 - 400 K/uL  Comprehensive metabolic panel     Status: Abnormal   Collection Time: 04/07/15  7:40 PM  Result Value Ref Range   Sodium 133 (L) 135 - 145 mmol/L   Potassium 3.9 3.5 - 5.1 mmol/L   Chloride 106 101 - 111 mmol/L   CO2 21 (L) 22 - 32 mmol/L   Glucose, Bld 78 65 - 99 mg/dL   BUN 10 6 - 20 mg/dL   Creatinine, Ser 0.65 0.44 - 1.00 mg/dL   Calcium 9.0 8.9 - 10.3 mg/dL   Total Protein 7.5 6.5 - 8.1 g/dL   Albumin 3.5 3.5 - 5.0 g/dL   AST 23 15 - 41 U/L   ALT 20 14 - 54 U/L   Alkaline Phosphatase 142 (H) 38 - 126 U/L   Total Bilirubin 0.4 0.3 - 1.2 mg/dL   GFR calc non Af Amer >60 >60 mL/min   GFR calc Af Amer >60 >60 mL/min   Anion gap 6 5 - 15  Lactate dehydrogenase     Status: None   Collection Time: 04/07/15  7:40 PM  Result Value Ref Range   LDH 157 98 - 192 U/L  Uric acid     Status: Abnormal   Collection Time: 04/07/15  7:40 PM  Result Value Ref Range   Uric Acid, Serum 7.6 (H) 2.3 - 6.6 mg/dL  Type and screen     Status: None (Preliminary result)   Collection Time: 04/07/15  9:30 PM  Result Value Ref Range   ABO/RH(D) A NEG    Antibody Screen POS    Sample Expiration 04/10/2015    Antibody Identification PENDING    DAT, IgG PENDING    Assessment/Plan: 29 wks and 0 days with preeclampsia admitted to rule out severe preeclampsia. Bedrest with BRP's// Serial BP's/ Ultrasound for EFW and AFI.  Bethamethasone for [facility] MFM consult in AM  24 hour urine for protein PIH labs in AM  Dr . Nelda Marseille covering for Dr. Simona Huh starting at  [place] 04/08/2015   Yaileen Hofferber J. 04/07/2015, 10:52 PM

## 2015-04-07 NOTE — MAU Note (Addendum)
Pt reports she ws sent by doctor. Elevated b/p yesterday. protein in urine sent for PIH eval. Reports headache today and some dizziness.

## 2015-04-08 ENCOUNTER — Ambulatory Visit (HOSPITAL_COMMUNITY): Payer: BLUE CROSS/BLUE SHIELD

## 2015-04-08 DIAGNOSIS — Z3A29 29 weeks gestation of pregnancy: Secondary | ICD-10-CM

## 2015-04-08 DIAGNOSIS — O365930 Maternal care for other known or suspected poor fetal growth, third trimester, not applicable or unspecified: Secondary | ICD-10-CM

## 2015-04-08 DIAGNOSIS — O1400 Mild to moderate pre-eclampsia, unspecified trimester: Secondary | ICD-10-CM | POA: Diagnosis present

## 2015-04-08 LAB — PROTEIN, URINE, 24 HOUR
Collection Interval-UPROT: 24 hours
Protein, 24H Urine: 1066 mg/d — ABNORMAL HIGH (ref 50–100)
Protein, Urine: 52 mg/dL
Urine Total Volume-UPROT: 2050 mL

## 2015-04-08 LAB — COMPREHENSIVE METABOLIC PANEL
ALT: 21 U/L (ref 14–54)
AST: 23 U/L (ref 15–41)
Albumin: 3.3 g/dL — ABNORMAL LOW (ref 3.5–5.0)
Alkaline Phosphatase: 148 U/L — ABNORMAL HIGH (ref 38–126)
Anion gap: 8 (ref 5–15)
BUN: 9 mg/dL (ref 6–20)
CO2: 22 mmol/L (ref 22–32)
Calcium: 9 mg/dL (ref 8.9–10.3)
Chloride: 105 mmol/L (ref 101–111)
Creatinine, Ser: 0.68 mg/dL (ref 0.44–1.00)
GFR calc Af Amer: >60 mL/min (ref >60)
GFR calc non Af Amer: >60 mL/min (ref >60)
Glucose, Bld: 113 mg/dL — ABNORMAL HIGH (ref 65–99)
Potassium: 4.3 mmol/L (ref 3.5–5.1)
Sodium: 135 mmol/L (ref 135–145)
Total Bilirubin: 0.6 mg/dL (ref 0.3–1.2)
Total Protein: 7.9 g/dL (ref 6.5–8.1)

## 2015-04-08 MED ORDER — ONDANSETRON 8 MG PO TBDP
8.0000 mg | ORAL_TABLET | Freq: Three times a day (TID) | ORAL | Status: DC | PRN
  Administered 2015-04-08 – 2015-04-09 (×2): 8 mg via ORAL
  Filled 2015-04-08 (×2): qty 2
  Filled 2015-04-08: qty 1

## 2015-04-08 MED ORDER — DOXYLAMINE SUCCINATE (SLEEP) 25 MG PO TABS: 25.0000 mg | ORAL_TABLET | Freq: Every evening | ORAL | Status: DC | PRN

## 2015-04-08 MED ORDER — DOXYLAMINE-PYRIDOXINE 10-10 MG PO TBEC
1.0000 | DELAYED_RELEASE_TABLET | Freq: Three times a day (TID) | ORAL | Status: DC
  Administered 2015-04-08 (×2): 10 mg via ORAL
  Administered 2015-04-08 – 2015-04-09 (×4): 1 via ORAL
  Administered 2015-04-10: 10 mg via ORAL

## 2015-04-08 MED ORDER — ONDANSETRON HCL 4 MG PO TABS: 8.0000 mg | ORAL_TABLET | Freq: Three times a day (TID) | ORAL | Status: DC | PRN

## 2015-04-08 MED ORDER — FAMOTIDINE 20 MG PO TABS
20.0000 mg | ORAL_TABLET | Freq: Two times a day (BID) | ORAL | Status: DC
  Administered 2015-04-08 – 2015-04-10 (×5): 20 mg via ORAL
  Filled 2015-04-08 (×6): qty 1

## 2015-04-08 NOTE — Consult Note (Signed)
Maternal Fetal Medicine Consultation  Requesting Provider(s): Christophe Louis, MD  Reason for consultation: Preeclampsia  HPI: Kathy Zimmerman is a 24 yo G1P0, EDD 06/23/2015 who is currently at 29w 1d who is currently admitted due to preeclampsia.  The patient reports that she was noted to have some elevated blood pressures in the clinic earlier this week and was sent to MAU for further evaluation.  Initial blood pressures on arrival were 163/98, 157/104 and 149/102.  Since that time, her blood pressures have improved and have not required antihypertensive medications.  Her admission preeclampsia labs were remarkable for a urine P/C ratio of 0.95 and a uric acid of 7.6 mg/dl.  She is currently undergoing a 24-hr urine protein collection and has received the first of her betamethasone injections.  Ultrasound on admission showed an estimated fetal weight at the 18th %tile with an AC at the 3rd %tile. UA Doppler studies showed an elevated S/D ratio but no AEDF or REDF.  Over the last several weeks, she reports frequent headaches but is currently asymptomatic.  She denies RUQ pain or visual changes.  She denies a history of chronic hypertension.  OB History: OB History    Gravida Para Term Preterm AB TAB SAB Ectopic Multiple Living   1               PMH:  Past Medical History  Diagnosis Date  . Asthma   . IBS (irritable bowel syndrome)   . Anxiety     PSH:  Past Surgical History  Procedure Laterality Date  . Tendon repair    . Scleral buckle  08/09/2011    Procedure: SCLERAL BUCKLE;  Surgeon: Clent Demark Rankin;  Location: [place] [facility];  Service: Ophthalmology;  Laterality: Left;  with cryo   Meds:  No current facility-administered medications on file prior to encounter.   No current outpatient prescriptions on file prior to encounter.    Allergies:  Allergies  Allergen Reactions  . Sulfa Antibiotics Swelling   FH: History reviewed. No pertinent family history.   Soc:  History   Social  History  . Marital Status: Single    Spouse Name: N/A  . Number of Children: N/A  . Years of Education: N/A   Occupational History  . Not on file.   Social History Main Topics  . Smoking status: Never Smoker   . Smokeless tobacco: Not on file  . Alcohol Use: No  . Drug Use: No  . Sexual Activity: Yes    Birth Control/ Protection: None   Other Topics Concern  . Not on file   Social History Narrative    Review of Systems: no vaginal bleeding or cramping/contractions, no LOF, no nausea/vomiting. All other systems reviewed and are negative.  PE:   Filed Vitals:   04/08/15 1214  BP: 139/80  Pulse: 77  Temp: 98.2 F (36.8 C)  Resp: 16    GEN: well-appearing female ABD: gravid, NT  Please see separate document for fetal ultrasound report.  Labs: CBC    Component Value Date/Time   WBC 11.3* 04/07/2015 1940   RBC 4.14 04/07/2015 1940   HGB 13.1 04/07/2015 1940   HCT 37.2 04/07/2015 1940   PLT 164 04/07/2015 1940   MCV 89.9 04/07/2015 1940   MCH 31.6 04/07/2015 1940   MCHC 35.2 04/07/2015 1940   RDW 12.8 04/07/2015 1940   LYMPHSABS 2.7 01/25/2008 1555   MONOABS 0.5 01/25/2008 1555   EOSABS 0.1 01/25/2008 1555   BASOSABS 0.0 01/25/2008  1555   CMP     Component Value Date/Time   NA 135 04/08/2015 0510   K 4.3 04/08/2015 0510   CL 105 04/08/2015 0510   CO2 22 04/08/2015 0510   GLUCOSE 113* 04/08/2015 0510   BUN 9 04/08/2015 0510   CREATININE 0.68 04/08/2015 0510   CALCIUM 9.0 04/08/2015 0510   PROT 7.9 04/08/2015 0510   ALBUMIN 3.3* 04/08/2015 0510   AST 23 04/08/2015 0510   ALT 21 04/08/2015 0510   ALKPHOS 148* 04/08/2015 0510   BILITOT 0.6 04/08/2015 0510   GFRNONAA >60 04/08/2015 0510   GFRAA >60 04/08/2015 0510    A/P: 1) Single IUP at 29w 1d  2) Preeclampsia - concur with admission and course of betamethasone.  On admission, the patient had some isolated, severe range blood pressures that have since improved.  She had an abnormal urine protein/  creatinine ratio and meets criteria for preeclampsia.  While technically, she has not met preeclampsia with severe features (only one isolated severe range blood pressure at this time), I would be hesitant to discharge her home without some further observation.  3) Lagging fetal growth - overall EFW was at the 18th %tile, but the [facility] measures at the 3rd %tile. UA Doppler studies elevated but no AEDF or REDF appreciated.   Recommendations: 1) Recommend continued in-house observation at least over the next 5-7 days.  If the blood pressures remain normal and no other severe features develop, may consider discharge home with very close outpatient follow up.  Given the early gestational age that preeclampsia developed; however, I feel that this is unlikely to occur.  If blood pressures worsen or other features develop, would recommend continued inpatient management.  I let the patient know that she may very well be inpatient until she delivers. 2) Recommend preeclampsia labs 2-3x weekly 3) While blood pressures are currently stable without medications, would have low threshold to start medications if she develops severe range blood pressures.  My preference is either Labetalol 200 mg BID to TID or Procardia XL 30 mg daily.  Both may be titrated upward as needed to maintain blood pressures in the 140/90 ranges. 4) Recommend at least BID fetal tracings or more frequent as the clinical scenario dictates 5) Recommend weekly UA Doppler studies and BPPs.  Repeat ultrasound for growth in 3 weeks 6) Would move toward delivery for severe range blood pressures (SBP > 160 or DBP > 110) despite antihypertensive medications, thrombocytopenia (plts < 100K), LFTS > 2x normal, Creat > 1.2 mg/dl or severe headaches unresponsive to medications.  Thank you for the opportunity to be a part of the care of Kathy Zimmerman. Please contact our office if we can be of further assistance.   I spent approximately 30 minutes with this  patient with over 50% of time spent in face-to-face counseling.  Benjaman Lobe, MD Maternal Fetal Medicine

## 2015-04-08 NOTE — Progress Notes (Addendum)
ANTEPARTUM NOTE, HD #2  S:  Patient resting comfortable in bed.  Slight headache and blurry vision overnight, now resolved.  No headache currently.  No RUQ pain.  +FM.  No contractions, vaginal bleeding or LOF.  No acute complaints.  O: Temp:  [97.8 F (36.6 C)-98.9 F (37.2 C)] 97.8 F (36.6 C) (07/28 0407) Pulse Rate:  [55-85] 78 (07/28 0707) Resp:  [16-18] 16 (07/28 0407) BP: (129-163)/(71-113) 129/73 mmHg (07/28 0707) Weight:  [74.753 kg (164 lb 12.8 oz)] 74.753 kg (164 lb 12.8 oz) (07/27 1825)   Gen: A&Ox3, NAD CV: RRR, no MRG Resp: CTAB Abdomen: gravid, soft, non-tender Ext: No edema, no calf tenderness bilaterally  FHT: 135, moderate variability, + accels, no decels Toco: no contractions SVE: deferred  Labs:  Results for orders placed or performed during the hospital encounter of 04/07/15 (from the past 24 hour(s))  Urinalysis, Routine w reflex microscopic (not at [facility])     Status: Abnormal   Collection Time: 04/07/15  6:30 PM  Result Value Ref Range   Color, Urine STRAW (A) YELLOW   APPearance CLEAR CLEAR   Specific Gravity, Urine <1.005 (L) 1.005 - 1.030   pH 5.5 5.0 - 8.0   Glucose, UA NEGATIVE NEGATIVE mg/dL   Hgb urine dipstick NEGATIVE NEGATIVE   Bilirubin Urine NEGATIVE NEGATIVE   Ketones, ur NEGATIVE NEGATIVE mg/dL   Protein, ur NEGATIVE NEGATIVE mg/dL   Urobilinogen, UA 0.2 0.0 - 1.0 mg/dL   Nitrite NEGATIVE NEGATIVE   Leukocytes, UA NEGATIVE NEGATIVE  Protein / creatinine ratio, urine     Status: Abnormal   Collection Time: 04/07/15  6:30 PM  Result Value Ref Range   Creatinine, Urine 20.00 mg/dL   Total Protein, Urine 19 mg/dL   Protein Creatinine Ratio 0.95 (H) 0.00 - 0.15 mg/mg[Cre]  CBC     Status: Abnormal   Collection Time: 04/07/15  7:40 PM  Result Value Ref Range   WBC 11.3 (H) 4.0 - 10.5 K/uL   RBC 4.14 3.87 - 5.11 MIL/uL   Hemoglobin 13.1 12.0 - 15.0 g/dL   HCT 37.2 36.0 - 46.0 %   MCV 89.9 78.0 - 100.0 fL   MCH 31.6 26.0 - 34.0 pg    MCHC 35.2 30.0 - 36.0 g/dL   RDW 12.8 11.5 - 15.5 %   Platelets 164 150 - 400 K/uL  Comprehensive metabolic panel     Status: Abnormal   Collection Time: 04/07/15  7:40 PM  Result Value Ref Range   Sodium 133 (L) 135 - 145 mmol/L   Potassium 3.9 3.5 - 5.1 mmol/L   Chloride 106 101 - 111 mmol/L   CO2 21 (L) 22 - 32 mmol/L   Glucose, Bld 78 65 - 99 mg/dL   BUN 10 6 - 20 mg/dL   Creatinine, Ser 0.65 0.44 - 1.00 mg/dL   Calcium 9.0 8.9 - 10.3 mg/dL   Total Protein 7.5 6.5 - 8.1 g/dL   Albumin 3.5 3.5 - 5.0 g/dL   AST 23 15 - 41 U/L   ALT 20 14 - 54 U/L   Alkaline Phosphatase 142 (H) 38 - 126 U/L   Total Bilirubin 0.4 0.3 - 1.2 mg/dL   GFR calc non Af Amer >60 >60 mL/min   GFR calc Af Amer >60 >60 mL/min   Anion gap 6 5 - 15  Lactate dehydrogenase     Status: None   Collection Time: 04/07/15  7:40 PM  Result Value Ref Range   LDH  157 98 - 192 U/L  Uric acid     Status: Abnormal   Collection Time: 04/07/15  7:40 PM  Result Value Ref Range   Uric Acid, Serum 7.6 (H) 2.3 - 6.6 mg/dL  Type and screen     Status: None (Preliminary result)   Collection Time: 04/07/15  9:30 PM  Result Value Ref Range   ABO/RH(D) A NEG    Antibody Screen POS    Sample Expiration 04/10/2015    Antibody Identification PASSIVELY ACQUIRED ANTI-D    DAT, IgG NEG    Unit Number [id number]    Blood Component Type RED CELLS,LR    Unit division 00    Status of Unit ALLOCATED    Transfusion Status OK TO TRANSFUSE    Crossmatch Result COMPATIBLE    Unit Number [id number]    Blood Component Type RED CELLS,LR    Unit division 00    Status of Unit ALLOCATED    Transfusion Status OK TO TRANSFUSE    Crossmatch Result COMPATIBLE   Comprehensive metabolic panel     Status: Abnormal   Collection Time: 04/08/15  5:10 AM  Result Value Ref Range   Sodium 135 135 - 145 mmol/L   Potassium 4.3 3.5 - 5.1 mmol/L   Chloride 105 101 - 111 mmol/L   CO2 22 22 - 32 mmol/L   Glucose, Bld 113 (H) 65 - 99 mg/dL    BUN 9 6 - 20 mg/dL   Creatinine, Ser 0.68 0.44 - 1.00 mg/dL   Calcium 9.0 8.9 - 10.3 mg/dL   Total Protein 7.9 6.5 - 8.1 g/dL   Albumin 3.3 (L) 3.5 - 5.0 g/dL   AST 23 15 - 41 U/L   ALT 21 14 - 54 U/L   Alkaline Phosphatase 148 (H) 38 - 126 U/L   Total Bilirubin 0.6 0.3 - 1.2 mg/dL   GFR calc non Af Amer >60 >60 mL/min   GFR calc Af Amer >60 >60 mL/min   Anion gap 8 5 - 15    A/P: Pt is a 24 y.o. G1P0 @ [redacted]w[redacted]d- admitted for preeclampsia  1) Preeclampsia- no severe features -BP as above, no medications given or needed at this time -labs stable, 24 hr urine pending -MFM consult today  2) Antepartum care -Pt receiving BMZ course, 2nd dose tonight at 2130 -Ultrasound performed yesterday- [facility], full report pending -continue with PNV daily -Gen diet, bedrest with bathroom privileges -Rh negative, s/p RhoGAM @ 28wks  3) Nausea/vomiting in pregnancy -continue home regimen of zofran and diclegis   DISPO: If patient remains stable without severe features, pending MFM consult, suspect patient may be discharged tomorrow with close outpatient care.  For now, will continue in-hospital monitoring.    Janyth Pupa, DO [phone] (pager) [phone] (office)

## 2015-04-09 ENCOUNTER — Inpatient Hospital Stay (HOSPITAL_COMMUNITY): Payer: BLUE CROSS/BLUE SHIELD

## 2015-04-09 MED ORDER — BUTALBITAL-APAP-CAFFEINE 50-325-40 MG PO TABS: 1.0000 | ORAL_TABLET | ORAL | Status: DC | PRN

## 2015-04-09 MED ORDER — MECLIZINE HCL 12.5 MG PO TABS
12.5000 mg | ORAL_TABLET | Freq: Two times a day (BID) | ORAL | Status: DC | PRN
  Administered 2015-04-09: 12.5 mg via ORAL
  Filled 2015-04-09 (×3): qty 1

## 2015-04-09 MED ORDER — LACTATED RINGERS IV SOLN
INTRAVENOUS | Status: DC
  Administered 2015-04-09: 19:00:00 via INTRAVENOUS

## 2015-04-09 NOTE — Progress Notes (Signed)
RN reassessed pt's complaints of feeling hot and dizzy. Pt reports she is beginning to feel better - pt reports feeling much less anxious. Pt's repeat BP not severe range.

## 2015-04-09 NOTE — Progress Notes (Signed)
HD #3 IUP At 29 2/7 weeks  S:  Pt with c/o dizziness earlier.  Pt states she has a h/o vertigo and this is very similar to vertigo.  She has a mild headache with some nausea but unsure if it is due to nausea of pregnancy.  Denies visual changes, RUQ pain.  Active fetus noted.  No contractions or vaginal bleeding. Pt states she has a h/o anxiety (undiagnosed) and she felt anxious with last BP.  Repeat BP was better b/c she was able to calm herself down. Pt and mother have a lot of questions about hospital course, prognosis and fetal outcomes.  O: Temp:  [98.1 F (36.7 C)-98.3 F (36.8 C)] 98.3 F (36.8 C) (07/29 1137) Pulse Rate:  [64-86] 73 (07/29 1137) Resp:  [16-20] 18 (07/29 1137) BP: (142-160)/(88-99) 147/99 mmHg (07/29 1137) Weight:  [73.619 kg (162 lb 4.8 oz)] 73.619 kg (162 lb 4.8 oz) (07/29 0932)  160/93 once repeated 15 minute 147/99 UOP 200-400s  Gen: NAD, A&O x 3, Pt holding her head.  Speech normal. CV: RRR,  Resp: CTA bilaterally Abdomen: gravid, soft, non-tender Ext: No edema, no calf tenderness bilaterally Neuro:  2-3+DTR, no clonus   FHT: Great variability, accelerations present, spontaneous decel suspicious for late noted but reactive afterwards Toco: no contractions SVE: deferred  Labs:  No results found for this or any previous visit (from the past 24 hour(s)).  A/P: Pt is a 24 y.o. G1P0 @ 29 2/7 Preeclampsia, Mild- some borderline BP, close to severe range.  UOP good.  Fetal status reassuring Headaches- H/o headaches prior to pregnancy and early pregnancy;  Migraines vs. Preeclampsia H/o Vertigo- Likely cause of dizziness. Lagging Fetal growth (18%)- Cat 1 tracing currently.  NST TID.  Weekly Dopplers. Breech presentation. H/o Hyperemesis-Managed with Diclegis and Zofran. Anxiety  Plan: Added Meclizine for dizziness now.  Fioricet prn headache.  Pt instructed to notify RN if headaches do not resolve with medications.  Start Labetalol 100 mg BID if  severe range BPs persistantly. Continue current fetal monitoring. Dopplers once weekly.  EFW in 3 weeks. Preeclampsia labs tomorrow.  If normal, repeat Monday and Thursday. Daily weights, monitor UOP. NICU consult ordered.  Continue antinausea medications.  Discussed diagnosis, plan of care, prognosis with pt and family at bedside for 30+minutes.  All questions answered.  Appreciate Dr. Lisbeth Renshaw, MFM consult.  Dr. Nelda Marseille to cover from 5 pm-7 pm.  CCOB to cover from 7 pm and throughout the weekend.

## 2015-04-09 NOTE — Progress Notes (Signed)
RN notified MD of pt's FHR tracing, including a prolonged and variable deceleration. RN also notified MD of pt's increasing anxiety about her diagnosis. Per MD, MD to call pt and speak to her personally in attempt to calm her anxiety.

## 2015-04-09 NOTE — Progress Notes (Signed)
RN called into room - pt reports feeling "hot and dizzy". RN relayed to pt to stay in bed while feeling dizzy. RN took pt BP - borderline severe range, will reassess in 15 min.

## 2015-04-09 NOTE — Progress Notes (Signed)
RN notified MD of pt's FHR tracing - prolonged deceleration at beginning of strip. Per MD, BPP ordered.

## 2015-04-09 NOTE — Progress Notes (Signed)
RN notified MD pt was taken to U/S for BPP - MD unable to get in contact w/pt before she went. MD to try and contact her again once she returns.

## 2015-04-09 NOTE — Progress Notes (Signed)
RN notified MD of U/S tech's findings w/BPP. RN relayed that pt failed BPP w/score of 6/8, and that absent and reverse flow dopplers were observed. It was recommended that the pt not eat this time, and be put back on the FHR monitor for continuous monitoring. Per MD, RN to start IV fluids and keep pt on continuous monitoring. MD reported she will contact MFM and determine what the next step in pt's POC will be.

## 2015-04-10 ENCOUNTER — Encounter (HOSPITAL_COMMUNITY): Payer: Self-pay | Admitting: *Deleted

## 2015-04-10 ENCOUNTER — Inpatient Hospital Stay (HOSPITAL_COMMUNITY): Payer: BLUE CROSS/BLUE SHIELD | Admitting: Anesthesiology

## 2015-04-10 ENCOUNTER — Encounter (HOSPITAL_COMMUNITY)
Admission: AD | Disposition: A | Discharge status: Home / Self Care | Payer: Self-pay | Source: Ambulatory Visit | Attending: Obstetrics & Gynecology

## 2015-04-10 ENCOUNTER — Inpatient Hospital Stay (HOSPITAL_COMMUNITY): Payer: BLUE CROSS/BLUE SHIELD

## 2015-04-10 DIAGNOSIS — IMO0002 Reserved for concepts with insufficient information to code with codable children: Secondary | ICD-10-CM

## 2015-04-10 LAB — CBC
HCT: 36.8 % (ref 36.0–46.0)
Hemoglobin: 12.7 g/dL (ref 12.0–15.0)
MCH: 31.5 pg (ref 26.0–34.0)
MCHC: 34.5 g/dL (ref 30.0–36.0)
MCV: 91.3 fL (ref 78.0–100.0)
Platelets: 183 10*3/uL (ref 150–400)
RBC: 4.03 MIL/uL (ref 3.87–5.11)
RDW: 12.8 % (ref 11.5–15.5)
WBC: 15.1 10*3/uL — ABNORMAL HIGH (ref 4.0–10.5)

## 2015-04-10 LAB — MRSA PCR SCREENING: MRSA by PCR: NEGATIVE

## 2015-04-10 LAB — COMPREHENSIVE METABOLIC PANEL
ALT: 17 U/L (ref 14–54)
AST: 20 U/L (ref 15–41)
Albumin: 2.8 g/dL — ABNORMAL LOW (ref 3.5–5.0)
Alkaline Phosphatase: 123 U/L (ref 38–126)
Anion gap: 5 (ref 5–15)
BUN: 11 mg/dL (ref 6–20)
CO2: 24 mmol/L (ref 22–32)
Calcium: 8.4 mg/dL — ABNORMAL LOW (ref 8.9–10.3)
Chloride: 105 mmol/L (ref 101–111)
Creatinine, Ser: 0.6 mg/dL (ref 0.44–1.00)
GFR calc Af Amer: >60 mL/min (ref >60)
GFR calc non Af Amer: >60 mL/min (ref >60)
Glucose, Bld: 85 mg/dL (ref 65–99)
Potassium: 4 mmol/L (ref 3.5–5.1)
Sodium: 134 mmol/L — ABNORMAL LOW (ref 135–145)
Total Bilirubin: 0.3 mg/dL (ref 0.3–1.2)
Total Protein: 6.4 g/dL — ABNORMAL LOW (ref 6.5–8.1)

## 2015-04-10 LAB — LACTATE DEHYDROGENASE: LDH: 160 U/L (ref 98–192)

## 2015-04-10 LAB — GROUP B STREP BY PCR: Group B strep by PCR: NEGATIVE

## 2015-04-10 LAB — URIC ACID: Uric Acid, Serum: 6.9 mg/dL — ABNORMAL HIGH (ref 2.3–6.6)

## 2015-04-10 SURGERY — Surgical Case
Anesthesia: Spinal | Site: Abdomen

## 2015-04-10 MED ORDER — FENTANYL CITRATE (PF) 100 MCG/2ML IJ SOLN
INTRAMUSCULAR | Status: DC | PRN
  Administered 2015-04-10: 12.5 ug via INTRATHECAL

## 2015-04-10 MED ORDER — ONDANSETRON HCL 4 MG/2ML IJ SOLN
INTRAMUSCULAR | Status: AC
  Filled 2015-04-10: qty 2

## 2015-04-10 MED ORDER — CEFAZOLIN SODIUM-DEXTROSE 2-3 GM-% IV SOLR
INTRAVENOUS | Status: DC | PRN
  Administered 2015-04-10: 2 g via INTRAVENOUS

## 2015-04-10 MED ORDER — LACTATED RINGERS IV SOLN: INTRAVENOUS | Status: DC

## 2015-04-10 MED ORDER — SENNOSIDES-DOCUSATE SODIUM 8.6-50 MG PO TABS
2.0000 | ORAL_TABLET | ORAL | Status: DC
  Administered 2015-04-10 – 2015-04-12 (×3): 2 via ORAL
  Filled 2015-04-10 (×3): qty 2

## 2015-04-10 MED ORDER — FENTANYL CITRATE (PF) 100 MCG/2ML IJ SOLN: 25.0000 ug | INTRAMUSCULAR | Status: DC | PRN

## 2015-04-10 MED ORDER — IBUPROFEN 600 MG PO TABS
600.0000 mg | ORAL_TABLET | Freq: Four times a day (QID) | ORAL | Status: DC
  Administered 2015-04-10 – 2015-04-13 (×11): 600 mg via ORAL
  Filled 2015-04-10 (×11): qty 1

## 2015-04-10 MED ORDER — CITRIC ACID-SODIUM CITRATE 334-500 MG/5ML PO SOLN
ORAL | Status: AC
  Administered 2015-04-10: 30 mL
  Filled 2015-04-10: qty 15

## 2015-04-10 MED ORDER — MEPERIDINE HCL 25 MG/ML IJ SOLN
6.2500 mg | INTRAMUSCULAR | Status: AC | PRN
Start: 2015-04-10 — End: 2015-04-10
  Administered 2015-04-10 (×2): 6.25 mg via INTRAVENOUS

## 2015-04-10 MED ORDER — PHENYLEPHRINE 8 MG IN D5W 100 ML (0.08MG/ML) PREMIX OPTIME
INJECTION | INTRAVENOUS | Status: AC
  Filled 2015-04-10: qty 100

## 2015-04-10 MED ORDER — ONDANSETRON HCL 4 MG/2ML IJ SOLN
INTRAMUSCULAR | Status: DC | PRN
  Administered 2015-04-10: 4 mg via INTRAVENOUS

## 2015-04-10 MED ORDER — KETOROLAC TROMETHAMINE 30 MG/ML IJ SOLN: 30.0000 mg | Freq: Four times a day (QID) | INTRAMUSCULAR | Status: DC | PRN

## 2015-04-10 MED ORDER — PRENATAL MULTIVITAMIN CH
1.0000 | ORAL_TABLET | Freq: Every day | ORAL | Status: DC
  Administered 2015-04-11 – 2015-04-13 (×3): 1 via ORAL
  Filled 2015-04-10 (×3): qty 1

## 2015-04-10 MED ORDER — OXYTOCIN 10 UNIT/ML IJ SOLN
40.0000 [IU] | INTRAVENOUS | Status: DC | PRN
  Administered 2015-04-10: 40 [IU] via INTRAVENOUS

## 2015-04-10 MED ORDER — OXYCODONE-ACETAMINOPHEN 5-325 MG PO TABS
2.0000 | ORAL_TABLET | ORAL | Status: DC | PRN
  Administered 2015-04-11 – 2015-04-12 (×4): 2 via ORAL
  Filled 2015-04-10 (×4): qty 2

## 2015-04-10 MED ORDER — LACTATED RINGERS IV SOLN
INTRAVENOUS | Status: DC | PRN
  Administered 2015-04-10: 14:00:00 via INTRAVENOUS

## 2015-04-10 MED ORDER — BUPIVACAINE IN DEXTROSE 0.75-8.25 % IT SOLN
INTRATHECAL | Status: DC | PRN
  Administered 2015-04-10: 1.4 mL via INTRATHECAL

## 2015-04-10 MED ORDER — LACTATED RINGERS IV SOLN
2.0000 g/h | INTRAVENOUS | Status: AC
  Administered 2015-04-10 – 2015-04-11 (×2): 2 g/h via INTRAVENOUS
  Filled 2015-04-10 (×2): qty 80

## 2015-04-10 MED ORDER — MEPERIDINE HCL 25 MG/ML IJ SOLN
INTRAMUSCULAR | Status: AC
  Administered 2015-04-10: 6.25 mg via INTRAVENOUS
  Filled 2015-04-10: qty 1

## 2015-04-10 MED ORDER — OXYTOCIN 10 UNIT/ML IJ SOLN
INTRAMUSCULAR | Status: AC
  Filled 2015-04-10: qty 4

## 2015-04-10 MED ORDER — WITCH HAZEL-GLYCERIN EX PADS: 1.0000 "application " | MEDICATED_PAD | CUTANEOUS | Status: DC | PRN

## 2015-04-10 MED ORDER — SIMETHICONE 80 MG PO CHEW
80.0000 mg | CHEWABLE_TABLET | ORAL | Status: DC | PRN
  Filled 2015-04-10: qty 1

## 2015-04-10 MED ORDER — MENTHOL 3 MG MT LOZG
1.0000 | LOZENGE | OROMUCOSAL | Status: DC | PRN
  Filled 2015-04-10: qty 9

## 2015-04-10 MED ORDER — LACTATED RINGERS IV SOLN
INTRAVENOUS | Status: DC | PRN
  Administered 2015-04-10: 13:00:00 via INTRAVENOUS

## 2015-04-10 MED ORDER — NALBUPHINE HCL 10 MG/ML IJ SOLN: 5.0000 mg | INTRAMUSCULAR | Status: DC | PRN

## 2015-04-10 MED ORDER — TETANUS-DIPHTH-ACELL PERTUSSIS 5-2.5-18.5 LF-MCG/0.5 IM SUSP
0.5000 mL | Freq: Once | INTRAMUSCULAR | Status: AC
  Administered 2015-04-11: 0.5 mL via INTRAMUSCULAR
  Filled 2015-04-10: qty 0.5

## 2015-04-10 MED ORDER — MORPHINE SULFATE (PF) 0.5 MG/ML IJ SOLN
INTRAMUSCULAR | Status: DC | PRN
  Administered 2015-04-10: .1 mg via INTRATHECAL

## 2015-04-10 MED ORDER — MORPHINE SULFATE 0.5 MG/ML IJ SOLN
INTRAMUSCULAR | Status: AC
  Filled 2015-04-10: qty 10

## 2015-04-10 MED ORDER — BISACODYL 10 MG RE SUPP: 10.0000 mg | Freq: Every day | RECTAL | Status: DC | PRN

## 2015-04-10 MED ORDER — OXYTOCIN 40 UNITS IN LACTATED RINGERS INFUSION - SIMPLE MED: 62.5000 mL/h | INTRAVENOUS | Status: AC

## 2015-04-10 MED ORDER — NALBUPHINE HCL 10 MG/ML IJ SOLN: 5.0000 mg | Freq: Once | INTRAMUSCULAR | Status: AC | PRN

## 2015-04-10 MED ORDER — DIBUCAINE 1 % RE OINT: 1.0000 "application " | TOPICAL_OINTMENT | RECTAL | Status: DC | PRN

## 2015-04-10 MED ORDER — LACTATED RINGERS IV SOLN
INTRAVENOUS | Status: DC
  Administered 2015-04-10: 03:00:00 via INTRAVENOUS

## 2015-04-10 MED ORDER — FERROUS SULFATE 325 (65 FE) MG PO TABS
325.0000 mg | ORAL_TABLET | Freq: Two times a day (BID) | ORAL | Status: DC
  Administered 2015-04-10 – 2015-04-13 (×6): 325 mg via ORAL
  Filled 2015-04-10 (×6): qty 1

## 2015-04-10 MED ORDER — SODIUM CHLORIDE 0.9 % IJ SOLN: 3.0000 mL | INTRAMUSCULAR | Status: DC | PRN

## 2015-04-10 MED ORDER — CEFAZOLIN SODIUM-DEXTROSE 2-3 GM-% IV SOLR
2.0000 g | INTRAVENOUS | Status: DC
  Filled 2015-04-10: qty 50

## 2015-04-10 MED ORDER — DIPHENHYDRAMINE HCL 25 MG PO CAPS: 25.0000 mg | ORAL_CAPSULE | Freq: Four times a day (QID) | ORAL | Status: DC | PRN

## 2015-04-10 MED ORDER — KETOROLAC TROMETHAMINE 30 MG/ML IJ SOLN
30.0000 mg | Freq: Four times a day (QID) | INTRAMUSCULAR | Status: DC | PRN
  Administered 2015-04-10: 30 mg via INTRAMUSCULAR

## 2015-04-10 MED ORDER — NALOXONE HCL 0.4 MG/ML IJ SOLN: 0.4000 mg | INTRAMUSCULAR | Status: DC | PRN

## 2015-04-10 MED ORDER — DIPHENHYDRAMINE HCL 25 MG PO CAPS: 25.0000 mg | ORAL_CAPSULE | ORAL | Status: DC | PRN

## 2015-04-10 MED ORDER — ZOLPIDEM TARTRATE 5 MG PO TABS
5.0000 mg | ORAL_TABLET | Freq: Every evening | ORAL | Status: DC | PRN
  Administered 2015-04-11: 5 mg via ORAL
  Filled 2015-04-10: qty 1

## 2015-04-10 MED ORDER — SIMETHICONE 80 MG PO CHEW
80.0000 mg | CHEWABLE_TABLET | Freq: Three times a day (TID) | ORAL | Status: DC
  Administered 2015-04-10 – 2015-04-13 (×7): 80 mg via ORAL
  Filled 2015-04-10 (×6): qty 1

## 2015-04-10 MED ORDER — LANOLIN HYDROUS EX OINT: 1.0000 "application " | TOPICAL_OINTMENT | CUTANEOUS | Status: DC | PRN

## 2015-04-10 MED ORDER — SIMETHICONE 80 MG PO CHEW
80.0000 mg | CHEWABLE_TABLET | ORAL | Status: DC
  Administered 2015-04-10 – 2015-04-12 (×3): 80 mg via ORAL
  Filled 2015-04-10 (×3): qty 1

## 2015-04-10 MED ORDER — LACTATED RINGERS IV BOLUS (SEPSIS)
500.0000 mL | Freq: Once | INTRAVENOUS | Status: AC
Start: 2015-04-10 — End: 2015-04-10
  Administered 2015-04-10: 500 mL via INTRAVENOUS

## 2015-04-10 MED ORDER — LACTATED RINGERS IV SOLN
INTRAVENOUS | Status: DC
  Administered 2015-04-11 (×3): via INTRAVENOUS

## 2015-04-10 MED ORDER — MAGNESIUM SULFATE BOLUS VIA INFUSION
4.0000 g | Freq: Once | INTRAVENOUS | Status: AC
  Administered 2015-04-10: 4 g via INTRAVENOUS
  Filled 2015-04-10: qty 500

## 2015-04-10 MED ORDER — OXYCODONE-ACETAMINOPHEN 5-325 MG PO TABS
1.0000 | ORAL_TABLET | ORAL | Status: DC | PRN
Start: 2015-04-10 — End: 2015-04-13
  Administered 2015-04-11: 1 via ORAL
  Filled 2015-04-10 (×2): qty 1

## 2015-04-10 MED ORDER — FLEET ENEMA 7-19 GM/118ML RE ENEM: 1.0000 | ENEMA | Freq: Every day | RECTAL | Status: DC | PRN

## 2015-04-10 MED ORDER — ACETAMINOPHEN 325 MG PO TABS: 650.0000 mg | ORAL_TABLET | ORAL | Status: DC | PRN

## 2015-04-10 MED ORDER — PHENYLEPHRINE 8 MG IN D5W 100 ML (0.08MG/ML) PREMIX OPTIME
INJECTION | INTRAVENOUS | Status: DC | PRN
  Administered 2015-04-10: 26.6 ug/min via INTRAVENOUS

## 2015-04-10 MED ORDER — FENTANYL CITRATE (PF) 100 MCG/2ML IJ SOLN
INTRAMUSCULAR | Status: AC
  Filled 2015-04-10: qty 2

## 2015-04-10 MED ORDER — KETOROLAC TROMETHAMINE 30 MG/ML IJ SOLN
INTRAMUSCULAR | Status: AC
  Filled 2015-04-10: qty 1

## 2015-04-10 MED ORDER — SCOPOLAMINE 1 MG/3DAYS TD PT72
MEDICATED_PATCH | TRANSDERMAL | Status: AC
  Administered 2015-04-10: 1.5 mg via TRANSDERMAL
  Filled 2015-04-10: qty 1

## 2015-04-10 MED ORDER — SCOPOLAMINE 1 MG/3DAYS TD PT72
1.0000 | MEDICATED_PATCH | Freq: Once | TRANSDERMAL | Status: DC
  Administered 2015-04-10: 1.5 mg via TRANSDERMAL

## 2015-04-10 MED ORDER — SIMETHICONE 80 MG PO CHEW
80.0000 mg | CHEWABLE_TABLET | Freq: Four times a day (QID) | ORAL | Status: DC | PRN
  Administered 2015-04-10: 80 mg via ORAL
  Filled 2015-04-10: qty 1

## 2015-04-10 MED ORDER — ONDANSETRON HCL 4 MG/2ML IJ SOLN
4.0000 mg | Freq: Three times a day (TID) | INTRAMUSCULAR | Status: DC | PRN
  Administered 2015-04-11: 4 mg via INTRAVENOUS
  Filled 2015-04-10: qty 2

## 2015-04-10 MED ORDER — NALOXONE HCL 1 MG/ML IJ SOLN
1.0000 ug/kg/h | INTRAVENOUS | Status: DC | PRN
  Filled 2015-04-10: qty 2

## 2015-04-10 MED ORDER — MEASLES, MUMPS & RUBELLA VAC ~~LOC~~ INJ
0.5000 mL | INJECTION | Freq: Once | SUBCUTANEOUS | Status: DC
  Filled 2015-04-10: qty 0.5

## 2015-04-10 MED ORDER — DIPHENHYDRAMINE HCL 50 MG/ML IJ SOLN: 12.5000 mg | INTRAMUSCULAR | Status: DC | PRN

## 2015-04-10 SURGICAL SUPPLY — 33 items
BENZOIN TINCTURE PRP APPL 2/3 (GAUZE/BANDAGES/DRESSINGS) ×2 IMPLANT
CLAMP CORD UMBIL (MISCELLANEOUS) IMPLANT
CLOTH BEACON ORANGE TIMEOUT ST (SAFETY) ×2 IMPLANT
CONTAINER PREFILL 10% NBF 15ML (MISCELLANEOUS) IMPLANT
DRAIN JACKSON PRT FLT 10 (DRAIN) IMPLANT
DRAPE SHEET LG 3/4 BI-LAMINATE (DRAPES) IMPLANT
DRSG OPSITE POSTOP 4X10 (GAUZE/BANDAGES/DRESSINGS) ×2 IMPLANT
DURAPREP 26ML APPLICATOR (WOUND CARE) ×2 IMPLANT
ELECT REM PT RETURN 9FT ADLT (ELECTROSURGICAL) ×2 IMPLANT
ELECTRODE REM PT RTRN 9FT ADLT (ELECTROSURGICAL) ×1 IMPLANT
EVACUATOR SILICONE 100CC (DRAIN) IMPLANT
EXTRACTOR VACUUM M CUP 4 TUBE (SUCTIONS) IMPLANT
GLOVE BIO SURGEON STRL SZ 6.5 (GLOVE) ×2 IMPLANT
GLOVE BIOGEL PI IND STRL 7.0 (GLOVE) ×1 IMPLANT
GLOVE BIOGEL PI INDICATOR 7.0 (GLOVE) ×1
GOWN STRL REUS W/TWL LRG LVL3 (GOWN DISPOSABLE) ×4 IMPLANT
KIT ABG SYR 3ML LUER SLIP (SYRINGE) IMPLANT
NEEDLE HYPO 25X5/8 SAFETYGLIDE (NEEDLE) IMPLANT
NS IRRIG 1000ML POUR BTL (IV SOLUTION) ×2 IMPLANT
PACK C SECTION WH (CUSTOM PROCEDURE TRAY) ×2 IMPLANT
PAD OB MATERNITY 4.3X12.25 (PERSONAL CARE ITEMS) ×2 IMPLANT
RTRCTR C-SECT PINK 25CM LRG (MISCELLANEOUS) IMPLANT
STRIP CLOSURE SKIN 1/2X4 (GAUZE/BANDAGES/DRESSINGS) ×2 IMPLANT
SUT CHROMIC 0 CT 1 (SUTURE) ×2 IMPLANT
SUT MNCRL AB 3-0 PS2 27 (SUTURE) ×2 IMPLANT
SUT PLAIN 2 0 (SUTURE) ×2
SUT PLAIN 2 0 XLH (SUTURE) ×2 IMPLANT
SUT PLAIN ABS 2-0 CT1 27XMFL (SUTURE) ×2 IMPLANT
SUT SILK 2 0 SH (SUTURE) IMPLANT
SUT VIC AB 0 CTX 36 (SUTURE) ×4
SUT VIC AB 0 CTX36XBRD ANBCTRL (SUTURE) ×4 IMPLANT
TOWEL OR 17X24 6PK STRL BLUE (TOWEL DISPOSABLE) ×2 IMPLANT
TRAY FOLEY CATH SILVER 14FR (SET/KITS/TRAYS/PACK) ×2 IMPLANT

## 2015-04-10 NOTE — Transfer of Care (Signed)
Immediate Anesthesia Transfer of Care Note  Patient: Kathy Zimmerman  Procedure(s) Performed: Procedure(s): CESAREAN SECTION (N/A)  Patient Location: PACU  Anesthesia Type:Spinal  Level of Consciousness: awake, alert  and oriented  Airway & Oxygen Therapy: Patient Spontanous Breathing  Post-op Assessment: Report given to RN and Post -op Vital signs reviewed and stable  Post vital signs: Reviewed and stable  Last Vitals:  Filed Vitals:   04/10/15 1235  BP: 155/97  Pulse: 78  Temp: 36.7 C  Resp: 16    Complications: No apparent anesthesia complications

## 2015-04-10 NOTE — Anesthesia Postprocedure Evaluation (Signed)
  Anesthesia Post-op Note  Patient: Kathy Zimmerman  Procedure(s) Performed: Procedure(s) (LRB): CESAREAN SECTION (N/A)  Patient Location: PACU  Anesthesia Type: Spinal  Level of Consciousness: awake and alert   Airway and Oxygen Therapy: Patient Spontanous Breathing  Post-op Pain: mild  Post-op Assessment: Post-op Vital signs reviewed, Patient's Cardiovascular Status Stable, Respiratory Function Stable, Patent Airway and No signs of Nausea or vomiting  Last Vitals:  Filed Vitals:   04/10/15 1515  BP:   Pulse: 67  Temp:   Resp: 20    Post-op Vital Signs: stable   Complications: No apparent anesthesia complications

## 2015-04-10 NOTE — Progress Notes (Signed)
Chey Rachels Palinkas [phone]  Subjective: Nurse call reports prolonged deceleration x 2. States patient was turned during 1st deceleration.  Strip and Chart Reviewed.  Objective:  Filed Vitals:   04/09/15 2100 04/09/15 2200 04/09/15 2300 04/09/15 2355  BP:    134/92  Pulse:    67  Temp:    98.2 F (36.8 C)  TempSrc:    Oral  Resp: 18 18 18 18   Height:      Weight:        FHR: 155 bpm, Mod Var, -Decels, -Accels UC: None Graphed  Assessment: IUP at [redacted]w[redacted]d Cat I FT PreEclampsia  Plan: -Give LR bolus f/b continuous infusion -Continue to monitor -Continue other mgmt as ordered  Milinda Cave, CNM 04/10/2015 2:35 AM

## 2015-04-10 NOTE — Progress Notes (Addendum)
Hospital day # 3 pregnancy at [redacted]w[redacted]d--Pre-eclampsia with severe features, chronic HAs, chronic vertigo, fetal growth restriction, breech presentation, abnormal doppler flow, anxiety, intermittent prolonged decels, AFI 12%ile  S:  Resting quietly--partner at bedside.      Aware of frequent FM.      Perception of contractions: Very occasional, mild      Vaginal bleeding: None       Vaginal discharge:  None      Denies HA or visual sx.  Does have reflux and "gas" fullness in      epigastric are--has been ongoing during pregnancy.       Requested Rx for simethicone.  O: BP 156/78 mmHg  Pulse 47  Temp(Src) 98.3 F (36.8 C) (Oral)  Resp 16  Ht 5\' 6"  (1.676 m)  Wt 73.619 kg (162 lb 4.8 oz)  BMI 26.21 kg/m2   Filed Vitals:   04/09/15 2200 04/09/15 2300 04/09/15 2355 04/10/15 0738  BP:   134/92 156/78  Pulse:   67 47  Temp:   98.2 F (36.8 C) 98.3 F (36.8 C)  TempSrc:   Oral Oral  Resp: 18 18 18 16   Height:      Weight:       BP range during night:  134-162/78-92       Fetal tracings:  1-2 prolonged decels per hour, 1-2 min in duration, some associated with mild UCs (tracing UCs inverted), some spontaneous. Occasional quick variables.  Moderate variability at times.  Baseline 160-170 at present.      Contractions:   0-2 per hour, very mild      Uterus non-tender      Abdomen--soft, NT, no epigastric tenderness to palpation      Extremities: no significant edema and no signs of DVT.  DTR 2-3 +, no clonus.          Labs:   CBC Latest Ref Rng 04/10/2015 04/07/2015 08/09/2011  WBC 4.0 - 10.5 K/uL 15.1(H) 11.3(H) 6.6  Hemoglobin 12.0 - 15.0 g/dL 12.7 13.1 13.8  Hematocrit 36.0 - 46.0 % 36.8 37.2 39.4  Platelets 150 - 400 K/uL 183 164 200   CMP Latest Ref Rng 04/10/2015 04/08/2015 04/07/2015  Glucose 65 - 99 mg/dL 85 113(H) 78  BUN 6 - 20 mg/dL 11 9 10   Creatinine 0.44 - 1.00 mg/dL 0.60 0.68 0.65  Sodium 135 - 145 mmol/L 134(L) 135 133(L)  Potassium 3.5 - 5.1 mmol/L 4.0 4.3 3.9   Chloride 101 - 111 mmol/L 105 105 106  CO2 22 - 32 mmol/L 24 22 21(L)  Calcium 8.9 - 10.3 mg/dL 8.4(L) 9.0 9.0  Total Protein 6.5 - 8.1 g/dL 6.4(L) 7.9 7.5  Total Bilirubin 0.3 - 1.2 mg/dL 0.3 0.6 0.4  Alkaline Phos 38 - 126 U/L 123 148(H) 142(H)  AST 15 - 41 U/L 20 23 23   ALT 14 - 54 U/L 17 21 20           Meds:  . docusate sodium  100 mg Oral Daily  . Doxylamine-Pyridoxine  1 tablet Oral TID  . famotidine  20 mg Oral BID  . prenatal multivitamin  1 tablet Oral Q1200  . sodium chloride  3 mL Intravenous Q12H  Received Betamethasone course 7/27-7/28  Has not required anti-hypertensives.  [place] 04/09/15:  Breech, AFI 9.94, 12%ile, anterior placenta, BPP 6/8, intermittent reversal of flow on doppler studies.  A: IUP at 29 3/7 weeks     Pre-eclampsia with severe features--BP stable.     Fetal growth  restriction     Breech presentation     Abnormal doppler flow     Intermittent prolonged decels     Chronic HAs--stable     Chronic vertigo--stable on Meclizine     Anxiety          Stable, with need for ongoing evaluation of FHR  P: Continue current plan of care      MFM recommendations:  Start anti-hypertensives if persistent severe range BPs  Follow lab work at least 2x/week or prn (ORDERED FOR 8/1)  Recommend inpatient management until delivery--delivery timing  based on clinical status  Continuous EFM, delivery for non-reassuring FHR or maternal  Indications.  Repeat Doppler studies on 8/1 (ORDERED)  Delivery at 32 weeks or sooner as clinically indicated (persistent  reversal of flow, NRFHR, worsening maternal status)             Repeat fetal growth in 2 weeks.       MDs will follow      Close observation of FHR and maternal status.      Still needs NICU consult--nursing staff will contact NICU.      Support to patient and husband for pregnancy concerns.      Reviewed plan of care as stated above.      Advised them Dr. Charlesetta Garibaldi will see them today, and [facility] providers       will follow this weekend.      GBS done this am.  Donnel Saxon CNM, [place] 04/10/2015 8:28 AM  Addendum: Per consult with Dr. Sandy Salaam today.  Donnel Saxon, CNM 04/10/15 10:15a

## 2015-04-10 NOTE — Op Note (Signed)
Cesarean Section Procedure Note   Kathy Zimmerman  04/07/2015 - 04/10/2015  Indications: Breech Presentation and NRFHT WITH DECELS AND BPP4/8.   Severe preeclampsia  Pre-operative Diagnosis: nonreassuring fetal heart rate.   Post-operative Diagnosis: Same   Surgeon: Surgeon(s) and Role:    * Crawford Givens, MD - Primary   Assistants: Windy Kalata CMN   Anesthesia: spinal   Procedure Details:  The patient was seen in the Holding Room. The risks, benefits, complications, treatment options, and expected outcomes were discussed with the patient. The patient concurred with the proposed plan, giving informed consent. identified as Kathy Zimmerman and the procedure verified as C-Section Delivery. A Time Out was held and the above information confirmed.  After induction of anesthesia, the patient was draped and prepped in the usual sterile manner. A transverse incision was made and carried down through the subcutaneous tissue to the fascia. Fascial incision was made in the midline and extended transversely. The fascia was separated from the underlying rectus muscle superiorly and inferiorly. The peritoneum was identified and entered. Peritoneal incision was extended longitudinally with good visualization of bowel and bladder. The utero-vesical peritoneal reflection was incised transversely and the bladder flap was bluntly freed from the lower uterine segment.  An alexsis retractor was placed in the abdomen.   A low transverse uterine incision was made. Delivered from breech footling presentation was a  infant, with Apgar scores of not yet documented at one minute and not yet documented at five minutes. Cord ph was sent the umbilical cord was clamped and cut cord blood was obtained for evaluation. The placenta was removed Intact and appeared normal. The uterine outline, tubes and ovaries appeared normal}. The uterine incision was closed with running locked sutures of 0Vicryl. A second layer 0 vicrlyl was used to  imbricate the uterine incision    Hemostasis was observed. Lavage was carried out until clear. The alexsis was removed.  The peritoneum was closed with 0 chromic.  The muscles were examined and any bleeders were made hemostatic using bovie cautery device.   The fascia was then reapproximated with running sutures of 0 vicryl.  The subcutaneous tissue was reapproximated  With interrupted stitches using 2-0 plain gut. The subcuticular closure was performed using 3-27monocryl     Instrument, sponge, and needle counts were correct prior the abdominal closure and were correct at the conclusion of the case.    Findings: infant was delivered from footling breech presentation. The fluid was clear.   Nuchal cord times two.  The uterus tubes and ovaries appeared normal.     Estimated Blood Loss: 674ml   Total IV Fluids: 2277ml   Urine Output: 100CC OF clear urine  Specimens: placenta to pathology  Complications: no complications  Disposition: PACU - hemodynamically stable.   Maternal Condition: stable   Baby condition / location:  NICU  Attending Attestation: I performed the procedure.   Signed: Surgeon(s): Crawford Givens, MD

## 2015-04-10 NOTE — Progress Notes (Signed)
Patient c/o gas pain in upper abdominal lower chest area.  She was given her morning medication early.

## 2015-04-10 NOTE — Anesthesia Procedure Notes (Signed)
Spinal Patient location during procedure: OR Staffing Anesthesiologist: Montez Hageman Performed by: anesthesiologist  Preanesthetic Checklist Completed: patient identified, site marked, surgical consent, pre-op evaluation, timeout performed, IV checked, risks and benefits discussed and monitors and equipment checked Spinal Block Patient position: sitting Prep: Betadine Patient monitoring: heart rate, continuous pulse ox and blood pressure Approach: midline Location: L3-4 Injection technique: single-shot Needle Needle type: Spinocan and Sprotte  Needle gauge: 24 G Needle length: 9 cm Additional Notes Expiration date of kit checked and confirmed. Patient tolerated procedure well, without complications.

## 2015-04-10 NOTE — Progress Notes (Signed)
BPP completed = 4/8 (-2 breathing and tone), AFI 10.98, 21%ile, breech.  Per conversation with Dr. Leonides Sake, MFM--delivery recommended due to recurrent prolonged variables and decreased status of BPP.  Dr. Charlesetta Garibaldi notified. Will proceed with C/S.  R&B of C/S reviewed with patient and husband, including bleeding, infection, and damage to other organs.  Patient and husband seem to understand these risks and are in agreement with proceeding. Dr. Charlesetta Garibaldi in to speak with patient and review the same   Patient anticipates being on magnesium for 24 hours post-delivery. Ancef 2 gm IV on call to OR.  Donnel Saxon, CNM 04/10/15 1p

## 2015-04-10 NOTE — Anesthesia Preprocedure Evaluation (Signed)
Anesthesia Evaluation  Patient identified by MRN, date of birth, ID band Patient awake    Reviewed: Allergy & Precautions, NPO status , Patient's Chart, lab work & pertinent test results  History of Anesthesia Complications (+) PONV  Airway Mallampati: II  TM Distance: >3 FB Neck ROM: Full    Dental no notable dental hx. (+) Caps   Pulmonary neg pulmonary ROS, asthma ,  breath sounds clear to auscultation  Pulmonary exam normal       Cardiovascular hypertension, negative cardio ROS Normal cardiovascular examRhythm:Regular Rate:Normal     Neuro/Psych negative neurological ROS  negative psych ROS   GI/Hepatic negative GI ROS, Neg liver ROS,   Endo/Other  negative endocrine ROS  Renal/GU negative Renal ROS  negative genitourinary   Musculoskeletal negative musculoskeletal ROS (+)   Abdominal   Peds negative pediatric ROS (+)  Hematology negative hematology ROS (+)   Anesthesia Other Findings   Reproductive/Obstetrics (+) Pregnancy                             Anesthesia Physical Anesthesia Plan  ASA: II and emergent  Anesthesia Plan: Spinal   Post-op Pain Management:    Induction:   Airway Management Planned: Natural Airway  Additional Equipment:   Intra-op Plan:   Post-operative Plan:   Informed Consent: I have reviewed the patients History and Physical, chart, labs and discussed the procedure including the risks, benefits and alternatives for the proposed anesthesia with the patient or authorized representative who has indicated his/her understanding and acceptance.   Dental advisory given  Plan Discussed with: CRNA  Anesthesia Plan Comments:         Anesthesia Quick Evaluation

## 2015-04-11 ENCOUNTER — Encounter (HOSPITAL_COMMUNITY): Payer: Self-pay | Admitting: *Deleted

## 2015-04-11 DIAGNOSIS — F419 Anxiety disorder, unspecified: Secondary | ICD-10-CM

## 2015-04-11 LAB — COMPREHENSIVE METABOLIC PANEL
ALT: 20 U/L (ref 14–54)
AST: 22 U/L (ref 15–41)
Albumin: 2.7 g/dL — ABNORMAL LOW (ref 3.5–5.0)
Alkaline Phosphatase: 106 U/L (ref 38–126)
Anion gap: 3 — ABNORMAL LOW (ref 5–15)
BUN: 9 mg/dL (ref 6–20)
CO2: 28 mmol/L (ref 22–32)
Calcium: 6.9 mg/dL — ABNORMAL LOW (ref 8.9–10.3)
Chloride: 102 mmol/L (ref 101–111)
Creatinine, Ser: 0.66 mg/dL (ref 0.44–1.00)
GFR calc Af Amer: >60 mL/min (ref >60)
GFR calc non Af Amer: >60 mL/min (ref >60)
Glucose, Bld: 100 mg/dL — ABNORMAL HIGH (ref 65–99)
Potassium: 3.9 mmol/L (ref 3.5–5.1)
Sodium: 133 mmol/L — ABNORMAL LOW (ref 135–145)
Total Bilirubin: 0.3 mg/dL (ref 0.3–1.2)
Total Protein: 6.3 g/dL — ABNORMAL LOW (ref 6.5–8.1)

## 2015-04-11 LAB — TYPE AND SCREEN
ABO/RH(D): A NEG
Antibody Screen: POSITIVE
DAT, IgG: NEGATIVE
Unit division: 0
Unit division: 0

## 2015-04-11 LAB — RH IG WORKUP (INCLUDES ABO/RH)
ABO/RH(D): A NEG
Antibody Screen: POSITIVE
DAT, IgG: NEGATIVE
Fetal Screen: NEGATIVE
Gestational Age(Wks): 29
Unit division: 0

## 2015-04-11 LAB — CBC
HCT: 35.4 % — ABNORMAL LOW (ref 36.0–46.0)
Hemoglobin: 12.3 g/dL (ref 12.0–15.0)
MCH: 31.6 pg (ref 26.0–34.0)
MCHC: 34.7 g/dL (ref 30.0–36.0)
MCV: 91 fL (ref 78.0–100.0)
Platelets: 153 10*3/uL (ref 150–400)
RBC: 3.89 MIL/uL (ref 3.87–5.11)
RDW: 12.9 % (ref 11.5–15.5)
WBC: 17.6 10*3/uL — ABNORMAL HIGH (ref 4.0–10.5)

## 2015-04-11 LAB — URIC ACID: Uric Acid, Serum: 6.6 mg/dL (ref 2.3–6.6)

## 2015-04-11 LAB — LACTATE DEHYDROGENASE: LDH: 204 U/L — ABNORMAL HIGH (ref 98–192)

## 2015-04-11 LAB — MAGNESIUM: Magnesium: 5.5 mg/dL — ABNORMAL HIGH (ref 1.7–2.4)

## 2015-04-11 MED ORDER — HYDROXYZINE HCL 50 MG PO TABS
50.0000 mg | ORAL_TABLET | Freq: Three times a day (TID) | ORAL | Status: DC | PRN
  Administered 2015-04-11 – 2015-04-12 (×2): 50 mg via ORAL
  Filled 2015-04-11 (×4): qty 1

## 2015-04-11 MED ORDER — LABETALOL HCL 5 MG/ML IV SOLN
10.0000 mg | INTRAVENOUS | Status: DC | PRN
  Administered 2015-04-11: 10 mg via INTRAVENOUS
  Filled 2015-04-11: qty 4

## 2015-04-11 MED ORDER — LABETALOL HCL 200 MG PO TABS
200.0000 mg | ORAL_TABLET | Freq: Two times a day (BID) | ORAL | Status: DC
  Administered 2015-04-11 – 2015-04-13 (×4): 200 mg via ORAL
  Filled 2015-04-11 (×4): qty 1

## 2015-04-11 NOTE — Progress Notes (Addendum)
Kathy Zimmerman [id number] Postpartum Day 1 S/P Primary Cesarean Section due to NRFHT with Decelerations, Failed Antenatal Testing, Severe PreEclampsia, and Breech presentation  Subjective: Patient reports experiencing dizziness and nausea when assisted to side of bed yesterday, but none currently. Patient reports headache, but denies epigastric pain, numbness/tingling related to edema, and SOB. Reports consuming regular diet without issues and denies N/V. Patient reports no bowel movement, but is passing flatus.  Foley catheter remains in place and patient reports good output. States bleeding is "good."  Patient is pumping and denies issues.  Desires for postpartum contraception not addressed.  Pain is being appropriately managed with use of percocet.   Objective: Temp:  [97.7 F (36.5 C)-98.8 F (37.1 C)] 97.7 F (36.5 C) (07/31 0600) Pulse Rate:  [62-111] 83 (07/31 1000) Resp:  [16-23] 18 (07/31 0600) BP: (122-155)/(79-111) 140/87 mmHg (07/31 1000) SpO2:  [97 %-100 %] 100 % (07/31 1000) Weight:  [71.033 kg (156 lb 9.6 oz)-75.342 kg (166 lb 1.6 oz)] 75.342 kg (166 lb 1.6 oz) (07/31 0600)   Recent Labs  04/10/15 0513 04/11/15 0532  HGB 12.7 12.3  HCT 36.8 35.4*  WBC 15.1* 17.6*    Physical Exam:  General: alert, cooperative and no distress Mood/Affect: Appropriate/Bright Lungs: clear to auscultation, no wheezes, rales or rhonchi, symmetric air entry.  Heart: normal rate and regular rhythm. Breast: breasts appear normal, no suspicious masses, no skin or nipple changes or axillary nodes, not examined. Abdomen:  + bowel sounds, Soft, Appropriately Tender, Mild Distention Incision: Honeycomb dressing appears dry and intact.  Drainage noted and outlined appropriately Uterine Fundus: firm at umbilicus Lochia: appropriate Skin: Warm, Dry. DVT Evaluation: No evidence of DVT seen on physical exam. No significant calf/ankle edema. SCD on and functioning +3 Patellar Reflexes JP drain:    None  Assessment Post Operative Day 1 S/P Primary C/S Normal Involution MgSO4 Infusion BreastFeeding Headache  Plan: -Discussed ambulation and removal of foley catheter this afternoon -Patient expresses excitement about ambulation and seeing infant son in NICU -Report if headache symptoms worsen or onset of other symptoms -D/C MgSO4 at 1515 -[facility] will continue to manage until tomorrow morning at 7am--pt informed -Continue other mgmt as ordered -Dr. Sophronia Simas to be updated on patient status  Candela Krul, [place] MSN, CNM 04/11/2015, 10:50 AM   S: Nurse call reports patient seeing spots and with ongoing headache feels she may be on the verge of having a seizure.  Nurse states exam WNL and feels anxiety is causing concerns  O:  Filed Vitals:   04/11/15 1100  BP: 144/106  Pulse: 94  Temp:   Resp:    A: MgSO4 Infusion Headache Visual disturbance Anxiety  P: Vistaril 50mg  given as it is safe in breastfeeding mothers Nurse instructed to update provider as appropriate Will continue to monitor  KALENA, MANDER MSN, CNM 04/11/2015 11:23 AM

## 2015-04-11 NOTE — Progress Notes (Addendum)
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 Denies epigastric pain or difficulty breathing.  O:  Today's Vitals   04/11/15 1900 04/11/15 1930 04/11/15 2000 04/11/15 2023  BP: 139/90  151/87   Pulse: 72  104   Temp:   98.2 F (36.8 C)   TempSrc:   Oral   Resp:   18   Height:      Weight:      SpO2: 99%  99%   PainSc:  4   4    BP range 135-160/90-100.  Results for orders placed or performed during the hospital encounter of 04/07/15 (from the past 24 hour(s))  CBC     Status: Abnormal   Collection Time: 04/11/15  5:32 AM  Result Value Ref Range   WBC 17.6 (H) 4.0 - 10.5 K/uL   RBC 3.89 3.87 - 5.11 MIL/uL   Hemoglobin 12.3 12.0 - 15.0 g/dL   HCT 35.4 (L) 36.0 - 46.0 %   MCV 91.0 78.0 - 100.0 fL   MCH 31.6 26.0 - 34.0 pg   MCHC 34.7 30.0 - 36.0 g/dL   RDW 12.9 11.5 - 15.5 %   Platelets 153 150 - 400 K/uL  Lactate dehydrogenase     Status: Abnormal   Collection Time: 04/11/15  5:32 AM  Result Value Ref Range   LDH 204 (H) 98 - 192 U/L  Comprehensive metabolic panel     Status: Abnormal   Collection Time: 04/11/15  5:32 AM  Result Value Ref Range   Sodium 133 (L) 135 - 145 mmol/L   Potassium 3.9 3.5 - 5.1 mmol/L   Chloride 102 101 - 111 mmol/L   CO2 28 22 - 32 mmol/L   Glucose, Bld 100 (H) 65 - 99 mg/dL   BUN 9 6 - 20 mg/dL   Creatinine, Ser 0.66 0.44 - 1.00 mg/dL   Calcium 6.9 (L) 8.9 - 10.3 mg/dL   Total Protein 6.3 (L) 6.5 - 8.1 g/dL   Albumin 2.7 (L) 3.5 - 5.0 g/dL   AST 22 15 - 41 U/L   ALT 20 14 - 54 U/L   Alkaline Phosphatase 106 38 - 126 U/L   Total Bilirubin 0.3 0.3 - 1.2 mg/dL   GFR calc non Af Amer >60 >60 mL/min   GFR calc Af Amer >60 >60 mL/min   Anion gap 3 (L) 5 - 15  Uric acid     Status: None   Collection Time: 04/11/15  5:32 AM  Result Value Ref Range   Uric Acid, Serum 6.6 2.3 - 6.6 mg/dL  Magnesium     Status: Abnormal   Collection Time: 04/11/15  5:32 AM  Result Value Ref Range   Magnesium  5.5 (H) 1.7 - 2.4 mg/dL  Rh IG workup (includes ABO/Rh)     Status: None   Collection Time: 04/11/15 10:10 AM  Result Value Ref Range   Gestational Age(Wks) 29    ABO/RH(D) A NEG    Fetal Screen NEG    DAT, IgG NEG    Antibody Identification PASSIVELY ACQUIRED ANTI-D    Antibody Screen POS    Unit Number [id number]    Blood Component Type RHIG    Unit division 00    Status of Unit REL FROM [facility]    Transfusion Status OK TO TRANSFUSE     A: 24 yo G1 now P1 s/p primary c-section (day 1) for 1) Breech Presentation, 2) NRFHT WITH  DECELS, 3) BPP 4/8 and 4) Preeclampsia with severe features. Elevated BPs s/p Magnesium Sulfate (d/c'd today at 3:15 PM). Suspect "floaters" due to h/o retinal detachment of left eye w/ retinal break. Increased anxiety, started on Vistaril today.  P: Consulted Dr. Charlesetta Garibaldi. Will start Labetalol 200 mg bid w/ parameters to hold if SBP </= to 120 or DBP </= to 80.   Farrel Gordon, CNM 04/11/15, 8:56 PM

## 2015-04-11 NOTE — Anesthesia Postprocedure Evaluation (Signed)
  Anesthesia Post-op Note  Patient: Kathy Zimmerman  Procedure(s) Performed: Procedure(s): CESAREAN SECTION (N/A)  Patient Location: ICU  Anesthesia Type:Spinal  Level of Consciousness: awake, alert  and oriented  Airway and Oxygen Therapy: Patient Spontanous Breathing  Post-op Pain: none  Post-op Assessment: Post-op Vital signs reviewed, Patient's Cardiovascular Status Stable, Respiratory Function Stable, No signs of Nausea or vomiting, Adequate PO intake, Pain level controlled, No headache, No backache, Spinal receding and Patient able to bend at knees      Post-op Vital Signs: Reviewed and stable  Last Vitals:  Filed Vitals:   04/11/15 0910  BP: 141/89  Pulse: 70  Temp:   Resp:     Complications: No apparent anesthesia complications

## 2015-04-11 NOTE — Addendum Note (Signed)
Addendum  created 04/11/15 0931 by Jonna Munro, CRNA   Modules edited: Notes Section   Notes Section:  File: [id number]

## 2015-04-11 NOTE — Plan of Care (Signed)
Problem: Phase I Progression Outcomes Goal: Other Phase I Outcomes/Goals Outcome: Completed/Met Date Met:  04/11/15 Mag off

## 2015-04-11 NOTE — Progress Notes (Signed)
[place] husband, mom, and father were bedside. She was awake and said she was doing great; a little tired. They were about to go to [place] visit was brief. Please page if any support is needed. Chaplain Marlise Eves Holder   04/11/15 1700  Clinical Encounter Type  Visited With Patient and family together

## 2015-04-11 NOTE — Lactation Note (Signed)
This note was copied from the chart of [place]. Lactation Consultation Note  Patient Name: Kathy Zimmerman Date: 04/11/2015 Reason for consult: Initial assessment;NICU baby  Initial visit with Mom and FOB.  Baby at 75 hrs old, born at [redacted]w[redacted]d weighing 1 lb 15.8 oz.  Baby delivered by C-Sect due to worsening pre-eclampsia.  Mom in AICU on MgSO4.  Mom has history of anxiety, and she is very anxious at present.  Spent time trying to reassure her about baby, and about her care she is receiving.  Demonstrated breast massage, and manual breast expression.  Small glistening of colostrum noted.  Mom concerned about not getting any milk for her baby when she pumped.  Reassurance given about giving her body time.  Assisted her with pumping on premie setting.  Information on routine, and cleaning of equipment given.  During the end of the pumping, Mom became more anxious, and asked for her nurse.  She is very fearful of having a seizure (her mother had one in her presence when she was 24 yrs old).  Laid her down, dimmed the lights.  FOB very supportive.   To follow up prn, and in am.   Consult Status Consult Status: Follow-up Date: 04/12/15 Follow-up type: In-patient    Kathy Zimmerman 04/11/2015, 11:26 AM

## 2015-04-11 NOTE — Progress Notes (Signed)
C/O headache 4/10, nausea and floaters. Refused pain medication, Zofran 4 mg IV administered. We will continue to monitor.

## 2015-04-12 ENCOUNTER — Encounter (HOSPITAL_COMMUNITY): Payer: Self-pay | Admitting: Obstetrics and Gynecology

## 2015-04-12 LAB — LACTATE DEHYDROGENASE: LDH: 189 U/L (ref 98–192)

## 2015-04-12 LAB — COMPREHENSIVE METABOLIC PANEL WITH GFR
ALT: 14 U/L (ref 14–54)
AST: 21 U/L (ref 15–41)
Albumin: 2.8 g/dL — ABNORMAL LOW (ref 3.5–5.0)
Alkaline Phosphatase: 94 U/L (ref 38–126)
Anion gap: 6 (ref 5–15)
BUN: 9 mg/dL (ref 6–20)
CO2: 25 mmol/L (ref 22–32)
Calcium: 7.3 mg/dL — ABNORMAL LOW (ref 8.9–10.3)
Chloride: 104 mmol/L (ref 101–111)
Creatinine, Ser: 0.81 mg/dL (ref 0.44–1.00)
GFR calc Af Amer: >60 mL/min
GFR calc non Af Amer: >60 mL/min
Glucose, Bld: 99 mg/dL (ref 65–99)
Potassium: 3.6 mmol/L (ref 3.5–5.1)
Sodium: 135 mmol/L (ref 135–145)
Total Bilirubin: 0.6 mg/dL (ref 0.3–1.2)
Total Protein: 6.5 g/dL (ref 6.5–8.1)

## 2015-04-12 LAB — URIC ACID: Uric Acid, Serum: 7.1 mg/dL — ABNORMAL HIGH (ref 2.3–6.6)

## 2015-04-12 LAB — RUBELLA SCREEN: Rubella: 2.07 {index}

## 2015-04-12 LAB — SYPHILIS: RPR W/REFLEX TO RPR TITER AND TREPONEMAL ANTIBODIES, TRADITIONAL SCREENING AND DIAGNOSIS ALGORITHM: RPR Ser Ql: NONREACTIVE

## 2015-04-12 NOTE — Progress Notes (Signed)
H/A relieved, now 0/10. Denies further nausea.  BPs post initial Labetalol dose = 110-135/48-88.   Farrel Gordon, CNM 04/12/15, 6:42 AM

## 2015-04-12 NOTE — Progress Notes (Signed)
Subjective: Postop Day 2: Cesarean Delivery No complaints.  Pain controlled.  Lochia normal.  Breast feeding: Pumping yes.  Pt states headache has resolved.  Felt anxious b/c she could not get her BP down but she felt like she was doing something wrong.  After discussing Preeclampsia, pt feels better about taking BP medications.  States she has only urinated twice since foley came out 16 hours ago.  Pt states she was diagnosed with anxiety but was never offered medications just therapy.  She has had panic attacks before but none recently.  Agreeable to social work consult.  Objective: Temp:  [97.9 F (36.6 C)-98.9 F (37.2 C)] 98.5 F (36.9 C) (08/01 0506) Pulse Rate:  [70-105] 91 (08/01 0506) Resp:  [15-22] 15 (08/01 0138) BP: (110-160)/(48-109) 121/81 mmHg (08/01 0506) SpO2:  [99 %-100 %] 100 % (08/01 0506) Weight:  [69.967 kg (154 lb 4 oz)] 69.967 kg (154 lb 4 oz) (08/01 0506)  Physical Exam: Gen: NAD Lochia: Not visualized Uterine Fundus: firm, appropriately tender Incision: Dressing clean and dry. DVT Evaluation:  Edema present, no calf tenderness bilaterally Neuro:2-3+DTR    Recent Labs  04/10/15 0513 04/11/15 0532  HGB 12.7 12.3  HCT 36.8 35.4*   Cr 0.66 to 0.81   Assessment/Plan: Severe Preeclampsia at 29 weeks, S/p Magnesium x 24 hours. Status post C-section-doing well postoperatively. Severe range BP.    -Started on Labetalol 200 mg BID.  BP improved.  Continue that dosage for now. Consider TID prn.  With rising creatinine and lack of diuresis, suspect  preeclampsia has not resolved.  No signs of headache currently.  Await diuresis.  Precautions given.  Repeat Cr in am. Headaches-none currently.  If BP controlled hopefully will not have recurrent headaches. Anxiety-Continue Vistaril for now.  Consult SW.  Pt will likely be amenable to therapy to learn coping skills.  Hold off on  medication for now. Baby in NICU, intubated stable.  Thurnell Lose 04/12/2015,  8:28 AM

## 2015-04-12 NOTE — Progress Notes (Signed)
CSW attempted to meet with MOB to complete assessment due to NICU admission and hx of Anxiety, but she was not in her room at this time.  CSW will attempt again at a later time.

## 2015-04-13 LAB — COMPREHENSIVE METABOLIC PANEL
ALT: 17 U/L (ref 14–54)
AST: 22 U/L (ref 15–41)
Albumin: 2.9 g/dL — ABNORMAL LOW (ref 3.5–5.0)
Alkaline Phosphatase: 90 U/L (ref 38–126)
Anion gap: 3 — ABNORMAL LOW (ref 5–15)
BUN: 8 mg/dL (ref 6–20)
CO2: 22 mmol/L (ref 22–32)
Calcium: 8.2 mg/dL — ABNORMAL LOW (ref 8.9–10.3)
Chloride: 105 mmol/L (ref 101–111)
Creatinine, Ser: 0.6 mg/dL (ref 0.44–1.00)
GFR calc Af Amer: >60 mL/min (ref >60)
GFR calc non Af Amer: >60 mL/min (ref >60)
Glucose, Bld: 90 mg/dL (ref 65–99)
Potassium: 4.3 mmol/L (ref 3.5–5.1)
Sodium: 130 mmol/L — ABNORMAL LOW (ref 135–145)
Total Bilirubin: 0.4 mg/dL (ref 0.3–1.2)
Total Protein: 6.7 g/dL (ref 6.5–8.1)

## 2015-04-13 LAB — URIC ACID: Uric Acid, Serum: 6.7 mg/dL — ABNORMAL HIGH (ref 2.3–6.6)

## 2015-04-13 LAB — LACTATE DEHYDROGENASE: LDH: 174 U/L (ref 98–192)

## 2015-04-13 MED ORDER — OXYCODONE-ACETAMINOPHEN 5-325 MG PO TABS: 1.0000 | ORAL_TABLET | ORAL | Status: DC | PRN

## 2015-04-13 MED ORDER — IBUPROFEN 600 MG PO TABS: 600.0000 mg | ORAL_TABLET | Freq: Four times a day (QID) | ORAL | Status: DC | PRN

## 2015-04-13 MED ORDER — LABETALOL HCL 200 MG PO TABS: 200.0000 mg | ORAL_TABLET | Freq: Two times a day (BID) | ORAL | Status: DC

## 2015-04-13 NOTE — Clinical Social Work Maternal (Signed)
CLINICAL SOCIAL WORK MATERNAL/CHILD NOTE  Patient Details  Name: Kathy Zimmerman MRN: [id number] Date of Birth: 04/16/1991  Date:  04/13/2015  Clinical Social Worker Initiating Note:  Tyke Outman E. Brigitte Pulse, Ashley Date/ Time Initiated:  04/13/15/1030     Child's Name:  Kathy Zimmerman   Legal Guardian:   (Parents: Gerald Stabs and Janett Billow Adkins)   Need for Interpreter:  None   Date of Referral:  04/12/15     Reason for Referral:  Other (Comment) (Anxiety)   Referral Source:  Physician   Address:  [address] [place], [place], [place] [address]  Phone number:  [phone]   Household Members:  Spouse   Natural Supports (not living in the home):  [place], Extended Family, Friends, Immediate Family   Professional Supports:  (MOB agrees to seek care with a psychiatrist to follow her Anxiety/medication management.)   Employment:     Type of Work:     Education:      Pensions consultant:  Multimedia programmer   Other Resources:      Cultural/Religious Considerations Which May Impact Care: MOB reports that she and her husband have a strong faith in [place] and that their faith is very important to them.  Strengths:  Ability to meet basic needs , Compliance with medical plan , Understanding of illness   Risk Factors/Current Problems:  Mental Health Concerns    Cognitive State:  Alert , Linear Thinking , Goal Oriented , Insightful    Mood/Affect:  Calm , Relaxed , Interested    CSW Assessment: CSW met with MOB in her third floor room/319 to introduce myself, offer support and complete assessment due to NICU admission at 29.3 weeks as well as maternal Anxiety.  MOB was very pleasant and receptive to CSW intervention.  FOB soon entered the room and MOB gave permission to discuss anything in his presence.  FOB appears highly involved and supportive.   MOB shared her birth story and CSW helped her process the events of the last few days.  MOB feels she has not really had an opportunity to do so, as  baby's premature birth came as such a shock.  She feels she is coping well at this time and both parents are pleased with the care baby is receiving.  They state no questions or concerns about the NICU.  CSW encouraged parents to embrace this experience as baby's unique birth story and to try to take this one day at a time.  MOB was very open about her struggle with Anxiety.  She thinks her symptoms started around the age of 46 when she found her mother bloody and unconscious after having a seizure.  She reports that her sister's mental health concerns also make her nervous and anxious.  She reports that her sister has been diagnosed with "a million different things from multiple personalities to Bipolar Disorder," resulting in numerous mental health hospitalizations.  MOB also attributes her anxiety to the murder of her best friend's parents.  CSW provided supportive counseling as MOB discussed events in her past leading to increased symptoms of anxiety.  CSW had an in depth conversation about the benefits and risks of antidepressants and antianxiety medications in treating Anxiety.  MOB states she has never taken an antidepressant, but is interested at this time.  CSW feels MOB could benefit from starting an SSRI at this time given her history and facing a long NICU hospitalization for her baby.  CSW informed MOB that the medication takes 4-6 weeks  to reach a therapeutic, effective level in the body.  Therefore, CSW feels starting immediately is ideal.  CSW discussed signs and symptoms of PPD to watch for.  (MOB asked that CSW contact her OB to discuss.  CSW spoke with Michelle at Dr. Cole's office and Dr. Cole is in agreement with starting Zoloft.  A prescription will be called in to MOB's pharmacy.)  CSW informed MOB that while her OB can prescribe an antidepressant, CSW recommends that she seek care with a psychiatrist, the expert in the area of mental health, who can increase, change or add medication as  needed.  MOB and FOB agreed.  CSW suggests that they look for a provider on their insurance website or by calling their insurance company.  They agreed.  Parents were extremely appreciative of the care, support and information given by CSW. CSW also informed parents of baby's eligibility for SSI benefits due to his gestation and birth weight.  They are interested in applying and state understanding of the process explained by CSW.  CSW obtained MOB's signature on an authorization to disclose protected health information and provided parents with a copy of baby's admission summary to NICU.   CSW explained ongoing support services offered by NICU CSW and asked that MOB call any time. CSW provided contact information.   CSW Plan/Description:  Information/Referral to Community Resources , Patient/Family Education , Psychosocial Support and Ongoing Assessment of Needs    Tyberius Ryner Elizabeth, LCSW 04/13/2015, 12:46 PM 

## 2015-04-13 NOTE — Discharge Summary (Signed)
Obstetric Discharge Summary Reason for Admission: Preeclampsia at 29 wks  Prenatal Procedures: NST, Preeclampsia and ultrasound Intrapartum Procedures: cesarean: low cervical, transverse Postpartum Procedures: none Complications-Operative and Postpartum: none HEMOGLOBIN  Date Value Ref Range Status  04/11/2015 12.3 12.0 - 15.0 g/dL Final   HCT  Date Value Ref Range Status  04/11/2015 35.4* 36.0 - 46.0 % Final    Physical Exam:  General: alert and cooperative Lochia: appropriate Uterine Fundus: firm Incision: healing well DVT Evaluation: No evidence of DVT seen on physical exam.  Discharge Diagnoses: Preeclampsia with severe features/ s/p low transverse cesarean section   Discharge Information: Date: 04/13/2015 Activity: pelvic rest Diet: routine Medications: PNV, Ibuprofen, Percocet and labetalol Condition: stable and improved Instructions: refer to practice specific booklet Discharge to: home Follow-up Information    Follow up with Thurnell Lose, MD. Schedule an appointment as soon as possible for a visit in 1 week.   Specialty:  Obstetrics and Gynecology   Why:  blood pressure check    Contact information:   [address] [place] [address] [phone]       Newborn Data: Live born female  Birth Weight: 1 lb 14.7 oz (870 g) APGAR: 4, 6  Baby in [place] J. 04/13/2015, 8:19 AM

## 2015-04-13 NOTE — Progress Notes (Signed)
Pt is discharged in the care of Husband. Denies pain or discomfort. In fant to remain in [place]. Pt has honeycomb dressing which is clean and dry. Pt understands all discharge instructions well. Questions were asked and answered. Stable. Breast needed for home use.

## 2015-04-13 NOTE — Progress Notes (Signed)
Subjective: Postpartum Day 3: Cesarean Delivery Patient reports tolerating PO, + flatus and no problems voiding.   Has floaters occasionally however had these prior to pregnancy .Marland Kitchen No headache.   Objective: Vital signs in last 24 hours: Temp:  [98 F (36.7 C)-99.8 F (37.7 C)] 98.2 F (36.8 C) (08/02 0527) Pulse Rate:  [71-92] 76 (08/02 0527) Resp:  [16-18] 16 (08/02 0527) BP: (131-151)/(84-100) 136/85 mmHg (08/02 0527) SpO2:  [100 %] 100 % (08/02 0527) Weight:  [71.723 kg (158 lb 1.9 oz)] 71.723 kg (158 lb 1.9 oz) (08/02 0527)  Physical Exam:  General: alert and cooperative Lochia: appropriate Uterine Fundus: firm Incision: healing well DVT Evaluation: No evidence of DVT seen on physical exam.   Recent Labs  04/11/15 0532  HGB 12.3  HCT 35.4*    Assessment/Plan: Status post Cesarean section. Doing well postoperatively.   preeclampsia with severe features...  Pt is s/p 24 hours of magnesium post  Delivery.Marland Kitchen bp well controlled with labetalol 200 mg bid  Discharge home follow up  In the office in 1 week for bp check .  Harold Moncus J. 04/13/2015, 8:12 AM

## 2015-04-13 NOTE — Lactation Note (Signed)
This note was copied from the chart of [facility]. Lactation Consultation Note  Follow up visit made.  Mom states baby is going well and she obtained drops this AM.  Reviewed pumping and hand expression.  2 week rental completed.  Questions answered.  Encouraged to call for concerns prn.  Reassured we will be available to assist in anyway.  Patient Name: Kathy Zimmerman [id number] Date: 04/13/2015     Maternal Data    Feeding Feeding Type: Donor Breast Milk Length of feed: 10 min  LATCH Score/Interventions                      Lactation Tools Discussed/Used     Consult Status      Ave Filter 04/13/2015, 12:13 PM

## 2015-04-14 ENCOUNTER — Inpatient Hospital Stay (HOSPITAL_COMMUNITY)
Admission: AD | Admit: 2015-04-14 | Discharge: 2015-04-14 | Disposition: A | Discharge status: Home / Self Care | Payer: BLUE CROSS/BLUE SHIELD | Source: Ambulatory Visit | Attending: Obstetrics & Gynecology | Admitting: Obstetrics & Gynecology

## 2015-04-14 ENCOUNTER — Encounter (HOSPITAL_COMMUNITY): Payer: Self-pay | Admitting: *Deleted

## 2015-04-14 ENCOUNTER — Inpatient Hospital Stay (HOSPITAL_COMMUNITY): Payer: BLUE CROSS/BLUE SHIELD

## 2015-04-14 DIAGNOSIS — R0602 Shortness of breath: Secondary | ICD-10-CM | POA: Insufficient documentation

## 2015-04-14 DIAGNOSIS — O9089 Other complications of the puerperium, not elsewhere classified: Secondary | ICD-10-CM | POA: Insufficient documentation

## 2015-04-14 DIAGNOSIS — R079 Chest pain, unspecified: Secondary | ICD-10-CM | POA: Insufficient documentation

## 2015-04-14 DIAGNOSIS — O99345 Other mental disorders complicating the puerperium: Secondary | ICD-10-CM | POA: Diagnosis not present

## 2015-04-14 DIAGNOSIS — F419 Anxiety disorder, unspecified: Secondary | ICD-10-CM | POA: Diagnosis not present

## 2015-04-14 LAB — CBC
HCT: 37.4 % (ref 36.0–46.0)
Hemoglobin: 12.7 g/dL (ref 12.0–15.0)
MCH: 31.4 pg (ref 26.0–34.0)
MCHC: 34 g/dL (ref 30.0–36.0)
MCV: 92.6 fL (ref 78.0–100.0)
Platelets: 233 10*3/uL (ref 150–400)
RBC: 4.04 MIL/uL (ref 3.87–5.11)
RDW: 12.9 % (ref 11.5–15.5)
WBC: 11.7 10*3/uL — ABNORMAL HIGH (ref 4.0–10.5)

## 2015-04-14 LAB — COMPREHENSIVE METABOLIC PANEL
ALT: 31 U/L (ref 14–54)
AST: 28 U/L (ref 15–41)
Albumin: 3.1 g/dL — ABNORMAL LOW (ref 3.5–5.0)
Alkaline Phosphatase: 86 U/L (ref 38–126)
Anion gap: 11 (ref 5–15)
BUN: 11 mg/dL (ref 6–20)
CO2: 26 mmol/L (ref 22–32)
Calcium: 9.8 mg/dL (ref 8.9–10.3)
Chloride: 99 mmol/L — ABNORMAL LOW (ref 101–111)
Creatinine, Ser: 0.65 mg/dL (ref 0.44–1.00)
GFR calc Af Amer: >60 mL/min (ref >60)
GFR calc non Af Amer: >60 mL/min (ref >60)
Glucose, Bld: 83 mg/dL (ref 65–99)
Potassium: 4.5 mmol/L (ref 3.5–5.1)
Sodium: 136 mmol/L (ref 135–145)
Total Bilirubin: 0.3 mg/dL (ref 0.3–1.2)
Total Protein: 7.4 g/dL (ref 6.5–8.1)

## 2015-04-14 LAB — URIC ACID: Uric Acid, Serum: 7.5 mg/dL — ABNORMAL HIGH (ref 2.3–6.6)

## 2015-04-14 LAB — LACTATE DEHYDROGENASE: LDH: 159 U/L (ref 98–192)

## 2015-04-14 LAB — PROTEIN / CREATININE RATIO, URINE
Creatinine, Urine: 36 mg/dL
Protein Creatinine Ratio: 0.28 mg/mg{creat} — ABNORMAL HIGH (ref 0.00–0.15)
Total Protein, Urine: 10 mg/dL

## 2015-04-14 MED ORDER — LABETALOL HCL 5 MG/ML IV SOLN
20.0000 mg | INTRAVENOUS | Status: DC | PRN
  Administered 2015-04-14: 40 mg via INTRAVENOUS
  Administered 2015-04-14: 20 mg via INTRAVENOUS
  Filled 2015-04-14: qty 4
  Filled 2015-04-14: qty 8

## 2015-04-14 MED ORDER — HYDRALAZINE HCL 20 MG/ML IJ SOLN: 10.0000 mg | Freq: Once | INTRAMUSCULAR | Status: DC | PRN

## 2015-04-14 MED ORDER — LABETALOL HCL 200 MG PO TABS: 400.0000 mg | ORAL_TABLET | Freq: Two times a day (BID) | ORAL | Status: DC

## 2015-04-14 MED ORDER — ALPRAZOLAM 0.5 MG PO TABS: 0.5000 mg | ORAL_TABLET | Freq: Two times a day (BID) | ORAL | Status: DC | PRN

## 2015-04-14 MED ORDER — IOHEXOL 350 MG/ML SOLN
100.0000 mL | Freq: Once | INTRAVENOUS | Status: AC | PRN
  Administered 2015-04-14: 100 mL via INTRAVENOUS

## 2015-04-14 MED ORDER — ALPRAZOLAM 0.25 MG PO TABS
1.0000 mg | ORAL_TABLET | Freq: Once | ORAL | Status: AC
  Administered 2015-04-14: 1 mg via ORAL
  Filled 2015-04-14: qty 4

## 2015-04-14 MED ORDER — LACTATED RINGERS IV SOLN
INTRAVENOUS | Status: DC
  Administered 2015-04-14: 15:00:00 via INTRAVENOUS

## 2015-04-14 MED ORDER — GI COCKTAIL ~~LOC~~
30.0000 mL | Freq: Once | ORAL | Status: AC
  Administered 2015-04-14: 30 mL via ORAL
  Filled 2015-04-14: qty 30

## 2015-04-14 NOTE — MAU Note (Addendum)
Pt post partum c/s delivered 04/10/15 at 29 weeks for preeclampsia . [place] home yesterday. Stared taking her new B/p meds and after she took the setraline stated she stared getting a warm/burning feeling in her chest and down her arm.  Felt dizzy and light headed and SOB.

## 2015-04-14 NOTE — MAU Provider Note (Signed)
History     CSN: [id number]  Arrival date and time: 04/14/15 1446   First Provider Initiated Contact with Patient 04/14/15 1508      Chief Complaint  Patient presents with  . Hypertension  . Chest Pain   HPI   Ms. Brittnei Jagiello Napoli is a 24 y.o. female G1P0101 presenting to MAU with chest pain and shortness of breath. She is status post cesarean section on 7/30 by Dr. Charlesetta Garibaldi d/t severe preeclampsia; she did not have hypertension with pregnancy. She was 29 weeks at the time of delivery; her baby is in NICU.   When she got up this morning she could not take a deep breath " feels like there is leaking in my air". "my breathing feels weird". She started feeling dizzy after getting up so she laid back down. The shortness of breath worsened throughout the day and she was not active when the shortness of breath came on. She started having severe chest burning; worse than heart burn. She also started feeling a weird taste in her mouth. She has a history of anxiety and has taken xanax in the past. She started Zoloft this morning.     OB History    Gravida Para Term Preterm AB TAB SAB Ectopic Multiple Living   '1 1  1     '$ 0 1      Past Medical History  Diagnosis Date  . Asthma   . IBS (irritable bowel syndrome)   . Anxiety     Past Surgical History  Procedure Laterality Date  . Tendon repair    . Scleral buckle  08/09/2011    Procedure: SCLERAL BUCKLE;  Surgeon: Clent Demark Rankin;  Location: [facility];  Service: Ophthalmology;  Laterality: Left;  with cryo  . Cesarean section N/A 04/10/2015    Procedure: CESAREAN SECTION;  Surgeon: Crawford Givens, MD;  Location: [facility];  Service: Obstetrics;  Laterality: N/A;    History reviewed. No pertinent family history.  History  Substance Use Topics  . Smoking status: Never Smoker   . Smokeless tobacco: Not on file  . Alcohol Use: No    Allergies:  Allergies  Allergen Reactions  . Sulfa Antibiotics Swelling    Prescriptions prior to admission   Medication Sig Dispense Refill Last Dose  . ibuprofen (ADVIL,MOTRIN) 600 MG tablet Take 1 tablet (600 mg total) by mouth every 6 (six) hours as needed. 30 tablet 1 04/13/2015 at Unknown time  . labetalol (NORMODYNE) 200 MG tablet Take 1 tablet (200 mg total) by mouth 2 (two) times daily. 60 tablet 1 04/14/2015 at 6:00am  . oxyCODONE-acetaminophen (PERCOCET/ROXICET) 5-325 MG per tablet Take 1-2 tablets by mouth every 4 (four) hours as needed for severe pain (for pain scale greater than 7). 30 tablet 0 04/13/2015 at Unknown time  . Prenatal Vit-Fe Fumarate-FA (PRENATAL MULTIVITAMIN) TABS tablet Take 1 tablet by mouth daily at 12 noon.   04/14/2015 at Unknown time  . sertraline (ZOLOFT) 50 MG tablet Take 50 mg by mouth daily.   04/14/2015 at Unknown time   Results for orders placed or performed during the hospital encounter of 04/14/15 (from the past 48 hour(s))  Protein / creatinine ratio, urine     Status: Abnormal   Collection Time: 04/14/15  3:10 PM  Result Value Ref Range   Creatinine, Urine 36.00 mg/dL   Total Protein, Urine 10 mg/dL    Comment: NO NORMAL RANGE ESTABLISHED FOR THIS TEST   Protein Creatinine Ratio 0.28 (H) 0.00 -  0.15 mg/mg[Cre]  CBC     Status: Abnormal   Collection Time: 04/14/15  3:19 PM  Result Value Ref Range   WBC 11.7 (H) 4.0 - 10.5 K/uL   RBC 4.04 3.87 - 5.11 MIL/uL   Hemoglobin 12.7 12.0 - 15.0 g/dL   HCT 37.4 36.0 - 46.0 %   MCV 92.6 78.0 - 100.0 fL   MCH 31.4 26.0 - 34.0 pg   MCHC 34.0 30.0 - 36.0 g/dL   RDW 12.9 11.5 - 15.5 %   Platelets 233 150 - 400 K/uL  Comprehensive metabolic panel     Status: Abnormal   Collection Time: 04/14/15  3:19 PM  Result Value Ref Range   Sodium 136 135 - 145 mmol/L   Potassium 4.5 3.5 - 5.1 mmol/L   Chloride 99 (L) 101 - 111 mmol/L   CO2 26 22 - 32 mmol/L   Glucose, Bld 83 65 - 99 mg/dL   BUN 11 6 - 20 mg/dL   Creatinine, Ser 0.65 0.44 - 1.00 mg/dL   Calcium 9.8 8.9 - 10.3 mg/dL   Total Protein 7.4 6.5 - 8.1 g/dL   Albumin  3.1 (L) 3.5 - 5.0 g/dL   AST 28 15 - 41 U/L   ALT 31 14 - 54 U/L   Alkaline Phosphatase 86 38 - 126 U/L   Total Bilirubin 0.3 0.3 - 1.2 mg/dL   GFR calc non Af Amer >60 >60 mL/min   GFR calc Af Amer >60 >60 mL/min    Comment: (NOTE) The eGFR has been calculated using the CKD EPI equation. This calculation has not been validated in all clinical situations. eGFR's persistently <60 mL/min signify possible Chronic Kidney Disease.    Anion gap 11 5 - 15    Comment: Performed at [facility]  Uric acid     Status: Abnormal   Collection Time: 04/14/15  3:19 PM  Result Value Ref Range   Uric Acid, Serum 7.5 (H) 2.3 - 6.6 mg/dL    Comment: Performed at [facility]  Lactate dehydrogenase     Status: None   Collection Time: 04/14/15  3:19 PM  Result Value Ref Range   LDH 159 98 - 192 U/L    Comment: Performed at [facility]     Review of Systems  Constitutional: Negative for fever.  Eyes: Positive for blurred vision.  Respiratory: Positive for shortness of breath.   Cardiovascular: Positive for chest pain. Negative for palpitations, orthopnea and leg swelling.  Gastrointestinal: Negative for heartburn, nausea, vomiting and constipation.  Neurological: Positive for dizziness and headaches.  Psychiatric/Behavioral: The patient is nervous/anxious.    Physical Exam   Blood pressure 142/98, pulse 89, temperature 98.5 F (36.9 C), resp. rate 18, SpO2 96 %, unknown if currently breastfeeding.  Physical Exam  Constitutional: She is oriented to person, place, and time. She appears well-developed and well-nourished.  Non-toxic appearance. She does not have a sickly appearance. She does not appear ill. She appears distressed.  HENT:  Head: Normocephalic.  Eyes: Pupils are equal, round, and reactive to light.  Neck: Normal range of motion. Neck supple. No tracheal deviation present. No thyromegaly present.  Cardiovascular: Normal rate and normal heart sounds.    Respiratory: Effort normal and breath sounds normal. No respiratory distress. She has no wheezes. She has no rales. She exhibits no tenderness.  Musculoskeletal: Normal range of motion. She exhibits no edema or tenderness.  Neurological: She is alert and oriented to person, place,  and time. She has normal reflexes. She displays normal reflexes. She exhibits normal muscle tone. Coordination normal.  Skin: Skin is warm. She is not diaphoretic.  Psychiatric: Her mood appears anxious. She does not exhibit a depressed mood.   Results for orders placed or performed during the hospital encounter of 04/14/15 (from the past 24 hour(s))  Protein / creatinine ratio, urine     Status: Abnormal   Collection Time: 04/14/15  3:10 PM  Result Value Ref Range   Creatinine, Urine 36.00 mg/dL   Total Protein, Urine 10 mg/dL   Protein Creatinine Ratio 0.28 (H) 0.00 - 0.15 mg/mg[Cre]  CBC     Status: Abnormal   Collection Time: 04/14/15  3:19 PM  Result Value Ref Range   WBC 11.7 (H) 4.0 - 10.5 K/uL   RBC 4.04 3.87 - 5.11 MIL/uL   Hemoglobin 12.7 12.0 - 15.0 g/dL   HCT 37.4 36.0 - 46.0 %   MCV 92.6 78.0 - 100.0 fL   MCH 31.4 26.0 - 34.0 pg   MCHC 34.0 30.0 - 36.0 g/dL   RDW 12.9 11.5 - 15.5 %   Platelets 233 150 - 400 K/uL  Comprehensive metabolic panel     Status: Abnormal   Collection Time: 04/14/15  3:19 PM  Result Value Ref Range   Sodium 136 135 - 145 mmol/L   Potassium 4.5 3.5 - 5.1 mmol/L   Chloride 99 (L) 101 - 111 mmol/L   CO2 26 22 - 32 mmol/L   Glucose, Bld 83 65 - 99 mg/dL   BUN 11 6 - 20 mg/dL   Creatinine, Ser 0.65 0.44 - 1.00 mg/dL   Calcium 9.8 8.9 - 10.3 mg/dL   Total Protein 7.4 6.5 - 8.1 g/dL   Albumin 3.1 (L) 3.5 - 5.0 g/dL   AST 28 15 - 41 U/L   ALT 31 14 - 54 U/L   Alkaline Phosphatase 86 38 - 126 U/L   Total Bilirubin 0.3 0.3 - 1.2 mg/dL   GFR calc non Af Amer >60 >60 mL/min   GFR calc Af Amer >60 >60 mL/min   Anion gap 11 5 - 15  Uric acid     Status: Abnormal    Collection Time: 04/14/15  3:19 PM  Result Value Ref Range   Uric Acid, Serum 7.5 (H) 2.3 - 6.6 mg/dL  Lactate dehydrogenase     Status: None   Collection Time: 04/14/15  3:19 PM  Result Value Ref Range   LDH 159 98 - 192 U/L   MAU Course  Procedures  None  MDM  Discussed HPI with Dr. Nelda Marseille CT scan Labetalol protocol  IV 20 mg then 40 mg given  PIH labs Xanax 1 mg PO   Discussed labs and CT scan with Dr. Nelda Marseille awaiting EKG.  Discharge order given if EKG Normal  ECG-NSR  Report given to [facility] at 17:15  Patient feeling more relaxed after Xanax given.  Assessment and Plan   A: Chest pain post partum       Anxiety      Shortness of Breath  P:Follow up in the office on Friday; call to make an appointment    Increase Labetalol to 400 mg BID    RX: Xanax for home    Continue Zoloft     Return to MAU if symptoms worsen    Lezlie Lye, NP 04/14/2015 3:35 PM

## 2015-04-19 ENCOUNTER — Encounter (HOSPITAL_COMMUNITY): Payer: Self-pay | Admitting: *Deleted

## 2015-04-19 ENCOUNTER — Inpatient Hospital Stay (HOSPITAL_COMMUNITY)
Admission: AD | Admit: 2015-04-19 | Discharge: 2015-04-19 | Disposition: A | Discharge status: Home / Self Care | Payer: BLUE CROSS/BLUE SHIELD | Source: Ambulatory Visit | Attending: Obstetrics and Gynecology | Admitting: Obstetrics and Gynecology

## 2015-04-19 DIAGNOSIS — F419 Anxiety disorder, unspecified: Secondary | ICD-10-CM | POA: Diagnosis not present

## 2015-04-19 DIAGNOSIS — I159 Secondary hypertension, unspecified: Secondary | ICD-10-CM | POA: Diagnosis not present

## 2015-04-19 DIAGNOSIS — I158 Other secondary hypertension: Secondary | ICD-10-CM

## 2015-04-19 DIAGNOSIS — R0602 Shortness of breath: Secondary | ICD-10-CM | POA: Insufficient documentation

## 2015-04-19 HISTORY — DX: Panic disorder (episodic paroxysmal anxiety): F41.0

## 2015-04-19 MED ORDER — LABETALOL HCL 100 MG PO TABS
400.0000 mg | ORAL_TABLET | Freq: Once | ORAL | Status: AC
  Administered 2015-04-19: 400 mg via ORAL
  Filled 2015-04-19: qty 4

## 2015-04-19 MED ORDER — LORAZEPAM 1 MG PO TABS: 2.0000 mg | ORAL_TABLET | Freq: Once | ORAL | Status: DC

## 2015-04-19 MED ORDER — LORAZEPAM 1 MG PO TABS
2.0000 mg | ORAL_TABLET | Freq: Once | ORAL | Status: AC
  Administered 2015-04-19: 2 mg via ORAL
  Filled 2015-04-19: qty 2

## 2015-04-19 MED ORDER — LORAZEPAM 1 MG PO TABS: 2.0000 mg | ORAL_TABLET | Freq: Three times a day (TID) | ORAL | Status: DC | PRN

## 2015-04-19 MED ORDER — ACETAMINOPHEN 500 MG PO TABS: 1000.0000 mg | ORAL_TABLET | Freq: Once | ORAL | Status: DC

## 2015-04-19 NOTE — MAU Note (Signed)
Pt reports she is having some shortness of breath and state she has a history of terrible anxiety and was seen here one week ago for the same shortness of breath.

## 2015-04-19 NOTE — MAU Provider Note (Signed)
History     CSN: [id number]  Arrival date and time: 04/19/15 5188   First Provider Initiated Contact with Patient 04/19/15 2119      No chief complaint on file.  HPI Comments: Kathy Zimmerman is a 24 y.o. G1P0101 who presents today with shortness of breath. She states that she was seen and evaluated for a PE on 04/14/15. She states that she has continued to feel unable to get a deep breath. She reports a history of anxiety. She is on xanax for anxiety at this time.   Shortness of Breath This is a new problem. The current episode started in the past 7 days. The problem occurs intermittently. The problem has been gradually worsening. Associated symptoms include headaches. Pertinent negatives include no abdominal pain or chest pain. Nothing aggravates the symptoms. Risk factors: recent pregnancy and delivery. C-section  She has tried nothing for the symptoms.    Past Medical History  Diagnosis Date  . Asthma   . IBS (irritable bowel syndrome)   . Anxiety   . Panic attack     Past Surgical History  Procedure Laterality Date  . Tendon repair    . Scleral buckle  08/09/2011    Procedure: SCLERAL BUCKLE;  Surgeon: Clent Demark Rankin;  Location: [facility];  Service: Ophthalmology;  Laterality: Left;  with cryo  . Cesarean section N/A 04/10/2015    Procedure: CESAREAN SECTION;  Surgeon: Crawford Givens, MD;  Location: [facility];  Service: Obstetrics;  Laterality: N/A;  . Eye surgery      laser    History reviewed. No pertinent family history.  History  Substance Use Topics  . Smoking status: Never Smoker   . Smokeless tobacco: Not on file  . Alcohol Use: No    Allergies:  Allergies  Allergen Reactions  . Sulfa Antibiotics Swelling    Prescriptions prior to admission  Medication Sig Dispense Refill Last Dose  . acetaminophen (TYLENOL) 500 MG tablet Take 1,000 mg by mouth every 6 (six) hours as needed for moderate pain.   04/19/2015 at 1400  . albuterol (PROVENTIL HFA;VENTOLIN HFA) 108 (90  BASE) MCG/ACT inhaler Inhale 1-2 puffs into the lungs every 6 (six) hours as needed for wheezing or shortness of breath.   rescue  . ALPRAZolam (XANAX) 0.5 MG tablet Take 1 tablet (0.5 mg total) by mouth 2 (two) times daily as needed for anxiety. 20 tablet 0 04/19/2015 at 1950  . ibuprofen (ADVIL,MOTRIN) 600 MG tablet Take 1 tablet (600 mg total) by mouth every 6 (six) hours as needed. (Patient taking differently: Take 600 mg by mouth every 6 (six) hours as needed for moderate pain. ) 30 tablet 1 04/19/2015 at 1740  . labetalol (NORMODYNE) 200 MG tablet Take 2 tablets (400 mg total) by mouth 2 (two) times daily. 60 tablet 1 04/19/2015 at 1740  . oxyCODONE-acetaminophen (PERCOCET/ROXICET) 5-325 MG per tablet Take 1-2 tablets by mouth every 4 (four) hours as needed for severe pain (for pain scale greater than 7). (Patient taking differently: Take 1 tablet by mouth every 4 (four) hours as needed for severe pain (for pain scale greater than 7). ) 30 tablet 0 04/19/2015 at [place]  . Prenatal Vit-Fe Fumarate-FA (PRENATAL MULTIVITAMIN) TABS tablet Take 1 tablet by mouth daily at 12 noon.   04/19/2015 at 1000  . sertraline (ZOLOFT) 50 MG tablet Take 50 mg by mouth daily.   04/19/2015 at 0600    Review of Systems  Eyes: Negative for blurred vision.  Respiratory:  Positive for shortness of breath.   Cardiovascular: Negative for chest pain and palpitations.  Gastrointestinal: Negative for abdominal pain.  Neurological: Positive for headaches.   Physical Exam   Blood pressure 133/92, pulse 76, temperature 98 F (36.7 C), temperature source Oral, resp. rate 21, height 5\' 6"  (1.676 m), weight 70.308 kg (155 lb), SpO2 99 %, unknown if currently breastfeeding.  Physical Exam  Nursing note and vitals reviewed. Constitutional: She is oriented to person, place, and time. She appears well-developed and well-nourished. No distress.  HENT:  Head: Normocephalic.  Cardiovascular: Normal rate and regular rhythm.   Respiratory:  Effort normal and breath sounds normal. No respiratory distress. She has no wheezes.  GI: Soft. There is no tenderness. There is no rebound.  Musculoskeletal: She exhibits no edema.  Neurological: She is alert and oriented to person, place, and time. She has normal reflexes.  Skin: Skin is warm and dry.  Psychiatric: She has a normal mood and affect.    MAU Course  Procedures  MDM 2154: DW Dr. Mancel Bale, will give 2 mg ativan now, and 400 mg labetalol  2255: Patient reports feeling better and is able to take full deep breaths now 2258: D/W Dr. Mancel Bale, ok for dc home. Will increase labetalol to TID and give ativan #20   Assessment and Plan   1. Anxiety   2. Other secondary hypertension    DC home Comfort measures reviewed  Pre-eclampsia warning signs  RX:  Labetalol TID Ativan 2mg  #10  Return to MAU as needed FU with OB as planned  Follow-up Information    Follow up with Thurnell Lose, MD.   Specialty:  Obstetrics and Gynecology   Why:  As scheduled   Contact information:   [place] [address] [place] [address] [phone]       Mathis Bud 04/19/2015, 9:20 PM

## 2015-04-19 NOTE — Discharge Instructions (Signed)

## 2015-04-30 ENCOUNTER — Emergency Department (HOSPITAL_COMMUNITY): Payer: BLUE CROSS/BLUE SHIELD

## 2015-04-30 ENCOUNTER — Emergency Department (HOSPITAL_COMMUNITY)
Admission: EM | Admit: 2015-04-30 | Discharge: 2015-04-30 | Disposition: A | Discharge status: Home / Self Care | Payer: BLUE CROSS/BLUE SHIELD | Attending: Emergency Medicine | Admitting: Emergency Medicine

## 2015-04-30 ENCOUNTER — Encounter (HOSPITAL_COMMUNITY): Payer: Self-pay

## 2015-04-30 DIAGNOSIS — O152 Eclampsia in the puerperium: Secondary | ICD-10-CM | POA: Diagnosis not present

## 2015-04-30 DIAGNOSIS — O99345 Other mental disorders complicating the puerperium: Secondary | ICD-10-CM | POA: Insufficient documentation

## 2015-04-30 DIAGNOSIS — O9953 Diseases of the respiratory system complicating the puerperium: Secondary | ICD-10-CM | POA: Diagnosis not present

## 2015-04-30 DIAGNOSIS — H539 Unspecified visual disturbance: Secondary | ICD-10-CM | POA: Insufficient documentation

## 2015-04-30 DIAGNOSIS — Z79899 Other long term (current) drug therapy: Secondary | ICD-10-CM | POA: Diagnosis not present

## 2015-04-30 DIAGNOSIS — Z8719 Personal history of other diseases of the digestive system: Secondary | ICD-10-CM | POA: Insufficient documentation

## 2015-04-30 DIAGNOSIS — O9989 Other specified diseases and conditions complicating pregnancy, childbirth and the puerperium: Secondary | ICD-10-CM | POA: Insufficient documentation

## 2015-04-30 DIAGNOSIS — R0789 Other chest pain: Secondary | ICD-10-CM | POA: Diagnosis not present

## 2015-04-30 DIAGNOSIS — F410 Panic disorder [episodic paroxysmal anxiety] without agoraphobia: Secondary | ICD-10-CM | POA: Insufficient documentation

## 2015-04-30 DIAGNOSIS — O99355 Diseases of the nervous system complicating the puerperium: Secondary | ICD-10-CM | POA: Insufficient documentation

## 2015-04-30 DIAGNOSIS — O1490 Unspecified pre-eclampsia, unspecified trimester: Secondary | ICD-10-CM

## 2015-04-30 DIAGNOSIS — O1093 Unspecified pre-existing hypertension complicating the puerperium: Secondary | ICD-10-CM | POA: Diagnosis present

## 2015-04-30 DIAGNOSIS — J45901 Unspecified asthma with (acute) exacerbation: Secondary | ICD-10-CM | POA: Insufficient documentation

## 2015-04-30 HISTORY — DX: Unspecified pre-eclampsia, unspecified trimester: O14.90

## 2015-04-30 LAB — URIC ACID: Uric Acid, Serum: 6 mg/dL (ref 2.3–6.6)

## 2015-04-30 LAB — URINALYSIS, ROUTINE W REFLEX MICROSCOPIC
Bilirubin Urine: NEGATIVE
Glucose, UA: NEGATIVE mg/dL
Ketones, ur: NEGATIVE mg/dL
Leukocytes, UA: NEGATIVE
Nitrite: NEGATIVE
Protein, ur: NEGATIVE mg/dL
Specific Gravity, Urine: <1.005 — ABNORMAL LOW (ref 1.005–1.030)
Urobilinogen, UA: 0.2 mg/dL (ref 0.0–1.0)
pH: 7 (ref 5.0–8.0)

## 2015-04-30 LAB — CBC WITH DIFFERENTIAL/PLATELET
Basophils Absolute: 0.1 10*3/uL (ref 0.0–0.1)
Basophils Relative: 1 % (ref 0–1)
Eosinophils Absolute: 0.3 10*3/uL (ref 0.0–0.7)
Eosinophils Relative: 3 % (ref 0–5)
HCT: 38.3 % (ref 36.0–46.0)
Hemoglobin: 12.8 g/dL (ref 12.0–15.0)
Lymphocytes Relative: 34 % (ref 12–46)
Lymphs Abs: 2.8 10*3/uL (ref 0.7–4.0)
MCH: 30.5 pg (ref 26.0–34.0)
MCHC: 33.4 g/dL (ref 30.0–36.0)
MCV: 91.4 fL (ref 78.0–100.0)
Monocytes Absolute: 0.5 10*3/uL (ref 0.1–1.0)
Monocytes Relative: 6 % (ref 3–12)
Neutro Abs: 4.5 10*3/uL (ref 1.7–7.7)
Neutrophils Relative %: 56 % (ref 43–77)
Platelets: 275 10*3/uL (ref 150–400)
RBC: 4.19 MIL/uL (ref 3.87–5.11)
RDW: 12.3 % (ref 11.5–15.5)
WBC: 8.1 10*3/uL (ref 4.0–10.5)

## 2015-04-30 LAB — PROTEIN / CREATININE RATIO, URINE
Creatinine, Urine: 14.06 mg/dL
Total Protein, Urine: <6 mg/dL

## 2015-04-30 LAB — PROTIME-INR
INR: 0.96 (ref 0.00–1.49)
Prothrombin Time: 13 s (ref 11.6–15.2)

## 2015-04-30 LAB — COMPREHENSIVE METABOLIC PANEL
ALT: 14 U/L (ref 14–54)
AST: 16 U/L (ref 15–41)
Albumin: 4.1 g/dL (ref 3.5–5.0)
Alkaline Phosphatase: 69 U/L (ref 38–126)
Anion gap: 11 (ref 5–15)
BUN: 14 mg/dL (ref 6–20)
CO2: 24 mmol/L (ref 22–32)
Calcium: 9.5 mg/dL (ref 8.9–10.3)
Chloride: 104 mmol/L (ref 101–111)
Creatinine, Ser: 0.67 mg/dL (ref 0.44–1.00)
GFR calc Af Amer: >60 mL/min (ref >60)
GFR calc non Af Amer: >60 mL/min (ref >60)
Glucose, Bld: 92 mg/dL (ref 65–99)
Potassium: 3.6 mmol/L (ref 3.5–5.1)
Sodium: 139 mmol/L (ref 135–145)
Total Bilirubin: 0.5 mg/dL (ref 0.3–1.2)
Total Protein: 7.9 g/dL (ref 6.5–8.1)

## 2015-04-30 LAB — URINE MICROSCOPIC-ADD ON

## 2015-04-30 LAB — TROPONIN I: Troponin I: <0.03 ng/mL (ref <0.031)

## 2015-04-30 LAB — LACTATE DEHYDROGENASE: LDH: 128 U/L (ref 98–192)

## 2015-04-30 MED ORDER — HYDRALAZINE HCL 20 MG/ML IJ SOLN
5.0000 mg | Freq: Once | INTRAMUSCULAR | Status: AC
  Administered 2015-04-30: 5 mg via INTRAVENOUS
  Filled 2015-04-30: qty 1

## 2015-04-30 MED ORDER — MAGNESIUM SULFATE 2 GM/50ML IV SOLN
2.0000 g | Freq: Once | INTRAVENOUS | Status: AC
  Administered 2015-04-30: 2 g via INTRAVENOUS
  Filled 2015-04-30: qty 50

## 2015-04-30 MED ORDER — LORAZEPAM 2 MG/ML IJ SOLN
1.0000 mg | Freq: Once | INTRAMUSCULAR | Status: AC
  Administered 2015-04-30: 1 mg via INTRAVENOUS
  Filled 2015-04-30: qty 1

## 2015-04-30 NOTE — ED Notes (Signed)
Patient verbalizes understanding of discharge instructions, home care and follow up care. Patient escorted out of department via wheelchair to go home with mother.

## 2015-04-30 NOTE — ED Notes (Signed)
While sitting in triage, pt states she suddenly feels dizzy and has blurred vision.  Pt encouraged to slow and deep breathe.  Pt skin cool and clammy, states she has had some chest discomfort intermittently, but none at this time.

## 2015-04-30 NOTE — ED Provider Notes (Addendum)
 CSN: [id number]     Arrival date & time 04/30/15  0059 History   First MD Initiated Contact with Patient 04/30/15 0122     Chief Complaint  Patient presents with  . Hypertension     (Consider location/radiation/quality/duration/timing/severity/associated sxs/prior Treatment) HPI  Kathy Zimmerman is a 24 y.o. female G1P0101 presenting with dizziness and blurred vision. Patient also reports chest pain and shortness of breath. . She is status post cesarean section on 7/30 by Dr. Charlesetta Garibaldi d/t severe preeclampsia; she did not have hypertension with pregnancy. She was 29 weeks at the time of delivery; her baby is in NICU. She is on labetalol 400 mg twice daily for hypertension. She states that she has been taking her blood pressure medications as directed. Patient reports that her blood pressures this last week have been "good." In triage her blood pressure was 176/112.  Patient denies any increased lower extremity swelling. She is very anxious and afraid of seizing. She reports chest pressure that is nonradiating. States that she has been ruled out for blood clots.  Patient also reports dizziness and "floaters." She states that floaters are not necessarily and usual for her and she recently had eye surgery.  Past Medical History  Diagnosis Date  . Asthma   . IBS (irritable bowel syndrome)   . Anxiety   . Panic attack   . Preeclampsia    Past Surgical History  Procedure Laterality Date  . Tendon repair    . Scleral buckle  08/09/2011    Procedure: SCLERAL BUCKLE;  Surgeon: Clent Demark Rankin;  Location: [facility];  Service: Ophthalmology;  Laterality: Left;  with cryo  . Cesarean section N/A 04/10/2015    Procedure: CESAREAN SECTION;  Surgeon: Crawford Givens, MD;  Location: [facility];  Service: Obstetrics;  Laterality: N/A;  . Eye surgery      laser   No family history on file. Social History  Substance Use Topics  . Smoking status: Never Smoker   . Smokeless tobacco: None  . Alcohol Use: No    OB History    Gravida Para Term Preterm AB TAB SAB Ectopic Multiple Living   1 1  1      0 1     Review of Systems  Constitutional: Negative for fever.  Eyes: Positive for visual disturbance.  Respiratory: Positive for chest tightness and shortness of breath. Negative for cough.   Cardiovascular: Negative for chest pain.  Gastrointestinal: Negative for nausea, vomiting and abdominal pain.  Genitourinary: Negative for dysuria.  Musculoskeletal: Negative for back pain.  Skin: Negative for wound.  Neurological: Positive for dizziness. Negative for headaches.  Psychiatric/Behavioral: Negative for confusion.  All other systems reviewed and are negative.     Allergies  Sulfa antibiotics  Home Medications   Prior to Admission medications   Medication Sig Start Date End Date Taking? Authorizing Provider  acetaminophen (TYLENOL) 500 MG tablet Take 1,000 mg by mouth every 6 (six) hours as needed for moderate pain.    Historical Provider, MD  albuterol (PROVENTIL HFA;VENTOLIN HFA) 108 (90 BASE) MCG/ACT inhaler Inhale 1-2 puffs into the lungs every 6 (six) hours as needed for wheezing or shortness of breath.    Historical Provider, MD  ALPRAZolam Duanne Moron) 0.5 MG tablet Take 1 tablet (0.5 mg total) by mouth 2 (two) times daily as needed for anxiety. 04/14/15   Lezlie Lye, NP  ibuprofen (ADVIL,MOTRIN) 600 MG tablet Take 1 tablet (600 mg total) by mouth every 6 (six) hours as  needed. Patient taking differently: Take 600 mg by mouth every 6 (six) hours as needed for moderate pain.  04/13/15   Christophe Louis, MD  labetalol (NORMODYNE) 200 MG tablet Take 2 tablets (400 mg total) by mouth 2 (two) times daily. 04/14/15   Artist Pais Rasch, NP  LORazepam (ATIVAN) 1 MG tablet Take 2 tablets (2 mg total) by mouth every 8 (eight) hours as needed for anxiety. 04/19/15   Tresea Mall, CNM  oxyCODONE-acetaminophen (PERCOCET/ROXICET) 5-325 MG per tablet Take 1-2 tablets by mouth every 4 (four) hours as needed  for severe pain (for pain scale greater than 7). Patient taking differently: Take 1 tablet by mouth every 4 (four) hours as needed for severe pain (for pain scale greater than 7).  04/13/15   Christophe Louis, MD  Prenatal Vit-Fe Fumarate-FA (PRENATAL MULTIVITAMIN) TABS tablet Take 1 tablet by mouth daily at 12 noon.    Historical Provider, MD  sertraline (ZOLOFT) 50 MG tablet Take 50 mg by mouth daily.    Historical Provider, MD   BP 125/83 mmHg  Pulse 93  Temp(Src) 98.4 F (36.9 C) (Oral)  Resp 14  Ht 5\' 6"  (1.676 m)  Wt 147 lb (66.679 kg)  BMI 23.74 kg/m2  SpO2 99% Physical Exam  Constitutional: She is oriented to person, place, and time. She appears well-developed and well-nourished.  Anxious appearing  HENT:  Head: Normocephalic and atraumatic.  Eyes: Conjunctivae and EOM are normal. Pupils are equal, round, and reactive to light.  Cardiovascular: Normal rate, regular rhythm and normal heart sounds.   No murmur heard. Pulmonary/Chest: Effort normal and breath sounds normal. No respiratory distress. She has no wheezes. She exhibits no tenderness.  Abdominal: Soft. Bowel sounds are normal. There is no tenderness. There is no rebound.  Well-healing lower abdominal incision, clean dry and intact  Musculoskeletal:  Trace bilateral lower extremity edema  Neurological: She is alert and oriented to person, place, and time.  Skin: Skin is warm and dry.  Psychiatric: She has a normal mood and affect.  Nursing note and vitals reviewed.   ED Course  Procedures (including critical care time) Labs Review Labs Reviewed  URINALYSIS, ROUTINE W REFLEX MICROSCOPIC (NOT AT [facility]) - Abnormal; Notable for the following:    Color, Urine STRAW (*)    Specific Gravity, Urine <1.005 (*)    Hgb urine dipstick LARGE (*)    All other components within normal limits  URINE MICROSCOPIC-ADD ON - Abnormal; Notable for the following:    Squamous Epithelial / LPF FEW (*)    Bacteria, UA FEW (*)    All other  components within normal limits  CBC WITH DIFFERENTIAL/PLATELET  COMPREHENSIVE METABOLIC PANEL  PROTIME-INR  LACTATE DEHYDROGENASE  URIC ACID  PROTEIN / CREATININE RATIO, URINE  TROPONIN I    Imaging Review Dg Chest Portable 1 View  04/30/2015   CLINICAL DATA:  Severe preeclampsia prior to C-section on July 30th. Dizziness and mild headache, acute onset. Initial encounter.  EXAM: PORTABLE CHEST - 1 VIEW  COMPARISON:  Chest radiograph performed 08/09/2011, and CTA of the chest performed 04/14/2015  FINDINGS: The lungs are well-aerated and clear. There is no evidence of focal opacification, pleural effusion or pneumothorax.  The cardiomediastinal silhouette is within normal limits. No acute osseous abnormalities are seen.  IMPRESSION: No acute cardiopulmonary process seen.   Electronically Signed   By: Garald Balding M.D.   On: 04/30/2015 03:07   I have personally reviewed and evaluated these images and lab  results as part of my medical decision-making.   EKG Interpretation   Date/Time:  Friday April 30 2015 01:27:25 EDT Ventricular Rate:  66 PR Interval:  164 QRS Duration: 81 QT Interval:  393 QTC Calculation: 412 R Axis:   65 Text Interpretation:  Sinus rhythm Confirmed by   MD,   ([id number]) on 04/30/2015 3:02:46 AM      MDM   Final diagnoses:  Preeclampsia in postpartum period, unspecified trimester    Patient presents with increasing blood pressures at home, dizziness, and headache. Very anxious on exam. Initial blood pressure 176/112. Patient took her nightly dose of labetalol. Patient given IV hydralazine, Ativan and 4 g magnesium bolus. Lab work obtained. Discussed with certified nurse midwife, Rive[place]o, on-call for patient's primary obstetrician. Lab work including LDH, uric acid, and protein creatinine ratio is all within normal limits. Patient's repeat blood pressures at the 120s over 80s. Her symptoms have resolved. Standard updated. No change in  patient's outpatient labetalol. Patient follow up closely on Monday in clinic. She was given strict return precautions.  After history, exam, and medical workup I feel the patient has been appropriately medically screened and is safe for discharge home. Pertinent diagnoses were discussed with the patient. Patient was given return precautions.     Merryl Hacker, MD 04/30/15 [place], MD 04/30/15 [phone]

## 2015-04-30 NOTE — Discharge Instructions (Signed)
You were seen today for high blood pressure. You have a recent history of preeclampsia. Your lab work is all reassuring. In consultation with your OB/GYN, there will be no changes with your blood pressure medications. You should follow-up closely on Monday. If you develop headache, vision changes, increasing blood pressure she should be reevaluated.  Preeclampsia and Eclampsia Preeclampsia is a serious condition that develops only during pregnancy. It is also called toxemia of pregnancy. This condition causes high blood pressure along with other symptoms, such as swelling and headaches. These may develop as the condition gets worse. Preeclampsia may occur 20 weeks or later into your pregnancy.  Diagnosing and treating preeclampsia early is very important. If not treated early, it can cause serious problems for you and your baby. One problem it can lead to is eclampsia, which is a condition that causes muscle jerking or shaking (convulsions) in the mother. Delivering your baby is the best treatment for preeclampsia or eclampsia.  RISK FACTORS The cause of preeclampsia is not known. You may be more likely to develop preeclampsia if you have certain risk factors. These include:   Being pregnant for the first time.  Having preeclampsia in a past pregnancy.  Having a family history of preeclampsia.  Having high blood pressure.  Being pregnant with twins or triplets.  Being 67 or older.  Being African American.  Having kidney disease or diabetes.  Having medical conditions such as lupus or blood diseases.  Being very overweight (obese). SIGNS AND SYMPTOMS  The earliest signs of preeclampsia are:  High blood pressure.  Increased protein in your urine. Your health care provider will check for this at every prenatal visit. Other symptoms that can develop include:   Severe headaches.  Sudden weight gain.  Swelling of your hands, face, legs, and feet.  Feeling sick to your stomach  (nauseous) and throwing up (vomiting).  Vision problems (blurred or double vision).  Numbness in your face, arms, legs, and feet.  Dizziness.  Slurred speech.  Sensitivity to bright lights.  Abdominal pain. DIAGNOSIS  There are no screening tests for preeclampsia. Your health care provider will ask you about symptoms and check for signs of preeclampsia during your prenatal visits. You may also have tests, including:  Urine testing.  Blood testing.  Checking your baby's heart rate.  Checking the health of your baby and your placenta using images created with sound waves (ultrasound). TREATMENT  You can work out the best treatment approach together with your health care provider. It is very important to keep all prenatal appointments. If you have an increased risk of preeclampsia, you may need more frequent prenatal exams.  Your health care provider may prescribe bed rest.  You may have to eat as little salt as possible.  You may need to take medicine to lower your blood pressure if the condition does not respond to more conservative measures.  You may need to stay in the hospital if your condition is severe. There, treatment will focus on controlling your blood pressure and fluid retention. You may also need to take medicine to prevent seizures.  If the condition gets worse, your baby may need to be delivered early to protect you and the baby. You may have your labor started with medicine (be induced), or you may have a cesarean delivery.  Preeclampsia usually goes away after the baby is born. HOME CARE INSTRUCTIONS   Only take over-the-counter or prescription medicines as directed by your health care provider.  Lie on  your left side while resting. This keeps pressure off your baby.  Elevate your feet while resting.  Get regular exercise. Ask your health care provider what type of exercise is safe for you.  Avoid caffeine and alcohol.  Do not smoke.  Drink 6-8  glasses of water every day.  Eat a balanced diet that is low in salt. Do not add salt to your food.  Avoid stressful situations as much as possible.  Get plenty of rest and sleep.  Keep all prenatal appointments and tests as scheduled. SEEK MEDICAL CARE IF:  You are gaining more weight than expected.  You have any headaches, abdominal pain, or nausea.  You are bruising more than usual.  You feel dizzy or light-headed. SEEK IMMEDIATE MEDICAL CARE IF:   You develop sudden or severe swelling anywhere in your body. This usually happens in the legs.  You gain 5 lb (2.3 kg) or more in a week.  You have a severe headache, dizziness, problems with your vision, or confusion.  You have severe abdominal pain.  You have lasting nausea or vomiting.  You have a seizure.  You have trouble moving any part of your body.  You develop numbness in your body.  You have trouble speaking.  You have any abnormal bleeding.  You develop a stiff neck.  You pass out. MAKE SURE YOU:   Understand these instructions.  Will watch your condition.  Will get help right away if you are not doing well or get worse. Document Released: 08/25/2000 Document Revised: 09/02/2013 Document Reviewed: 06/20/2013 [facility] Patient Information 2015 [place], [place]. This information is not intended to replace advice given to you by your health care provider. Make sure you discuss any questions you have with your health care provider.

## 2015-04-30 NOTE — ED Notes (Signed)
Pt states she had severe preeclampsia prior to having a c-section on July 30th, states tonight she started feeling dizzy and having a mild headache at home.  Pt admits to anxiety as well and took her own xanax approx 30 mins ago.

## 2015-05-01 ENCOUNTER — Telehealth: Payer: Self-pay | Admitting: Obstetrics and Gynecology

## 2015-05-01 NOTE — Telephone Encounter (Signed)
TC to patient in response to her call to [facility] for question regarding BP med.  Now pp, s/p C/S 04/10/15 for breech, NRFHR, BPP 4/8, severe pre-eclampsia.  D/c'd home on Labetalol 400 mg po BID, increased to 600 mg po BID on 8/19 at [facility] ER for elevated BP, ? Anxiety.  Taking BP at home--BP this am 120/80, "80s/60s" last night. Advised patient to continue Labetalol 400 mg po BID at present.  Will continue to check BP today as instructed, and will call me in the am with update on BP values.  Denies HA, visual sx, epigastric pain, or any other issues.  Baby stable in NICU.  Donnel Saxon, CNM 05/01/15 8:10a

## 2015-05-02 ENCOUNTER — Telehealth: Payer: Self-pay | Admitting: Obstetrics and Gynecology

## 2015-05-02 NOTE — Telephone Encounter (Signed)
TC from patient to f/u on BP--on Labetalol 600 mg po BID per evaluation in ER on Friday.  Patient taking BP frequently, hasn't taken Labetalol this am.    BP 120/90 this am.  Advised patient to take Labetalol 600 mg BID as directed, and to defer further BP checks unless she has HA, blurry vision, etc.  Recommended she call [facility] in am to check in with them, and keep scheduled appt with them on Tuesday.  Donnel Saxon, CNM 05/02/15 7:30a

## 2015-05-19 ENCOUNTER — Ambulatory Visit: Payer: Self-pay

## 2015-05-19 NOTE — Lactation Note (Signed)
This note was copied from the chart of [place]. Lactation Consultation Note  Called to NICU to assess mom's right breast.  She states it was painful and firm when she woke up this AM.  She just applied heat and massage and pumped 90 mls and reports feeling better.  Breast is soft and pink in color.  No signs or symptoms of mastitis.  Instructed to apply heat and massage prior to and during pumping sessions.  Recommended pumping every 2 hours today and watch for signs/symptoms of infection and call doctor if this occurs.  Patient Name: Kathy Zimmerman [id number] Date: 05/19/2015     Maternal Data    Feeding Feeding Type: Breast Milk Length of feed: 45 min  LATCH Score/Interventions                      Lactation Tools Discussed/Used     Consult Status      Ave Filter 05/19/2015, 3:41 PM

## 2015-05-21 ENCOUNTER — Encounter (HOSPITAL_COMMUNITY): Payer: Self-pay | Admitting: *Deleted

## 2015-05-21 DIAGNOSIS — M79661 Pain in right lower leg: Secondary | ICD-10-CM | POA: Insufficient documentation

## 2015-05-21 DIAGNOSIS — R079 Chest pain, unspecified: Secondary | ICD-10-CM | POA: Insufficient documentation

## 2015-05-21 DIAGNOSIS — Z79899 Other long term (current) drug therapy: Secondary | ICD-10-CM | POA: Insufficient documentation

## 2015-05-21 DIAGNOSIS — F410 Panic disorder [episodic paroxysmal anxiety] without agoraphobia: Secondary | ICD-10-CM | POA: Diagnosis not present

## 2015-05-21 DIAGNOSIS — J45909 Unspecified asthma, uncomplicated: Secondary | ICD-10-CM | POA: Insufficient documentation

## 2015-05-21 DIAGNOSIS — Z8719 Personal history of other diseases of the digestive system: Secondary | ICD-10-CM | POA: Diagnosis not present

## 2015-05-21 NOTE — ED Notes (Signed)
Pt states she has been having chest pain intermittently for 2 weeks (described as heaviness) as well as right thigh, leg and foot pain. Pt states she has been diagnosed with "severe" post partem anxiety. Pt had a c-section about 6 weeks ago.

## 2015-05-22 ENCOUNTER — Emergency Department (HOSPITAL_COMMUNITY): Payer: BLUE CROSS/BLUE SHIELD

## 2015-05-22 ENCOUNTER — Ambulatory Visit (HOSPITAL_COMMUNITY)
Admit: 2015-05-22 | Discharge: 2015-05-22 | Disposition: A | Discharge status: Home / Self Care | Payer: BLUE CROSS/BLUE SHIELD | Attending: Emergency Medicine | Admitting: Emergency Medicine

## 2015-05-22 ENCOUNTER — Emergency Department (HOSPITAL_COMMUNITY)
Admission: EM | Admit: 2015-05-22 | Discharge: 2015-05-22 | Disposition: A | Discharge status: Home / Self Care | Payer: BLUE CROSS/BLUE SHIELD | Attending: Emergency Medicine | Admitting: Emergency Medicine

## 2015-05-22 DIAGNOSIS — R079 Chest pain, unspecified: Secondary | ICD-10-CM

## 2015-05-22 DIAGNOSIS — M79661 Pain in right lower leg: Secondary | ICD-10-CM

## 2015-05-22 LAB — CBC WITH DIFFERENTIAL/PLATELET
Basophils Absolute: 0 10*3/uL (ref 0.0–0.1)
Basophils Relative: 0 % (ref 0–1)
Eosinophils Absolute: 0.3 10*3/uL (ref 0.0–0.7)
Eosinophils Relative: 3 % (ref 0–5)
HCT: 37.4 % (ref 36.0–46.0)
Hemoglobin: 12.8 g/dL (ref 12.0–15.0)
Lymphocytes Relative: 33 % (ref 12–46)
Lymphs Abs: 2.8 10*3/uL (ref 0.7–4.0)
MCH: 30.9 pg (ref 26.0–34.0)
MCHC: 34.2 g/dL (ref 30.0–36.0)
MCV: 90.3 fL (ref 78.0–100.0)
Monocytes Absolute: 0.9 10*3/uL (ref 0.1–1.0)
Monocytes Relative: 11 % (ref 3–12)
Neutro Abs: 4.4 10*3/uL (ref 1.7–7.7)
Neutrophils Relative %: 53 % (ref 43–77)
Platelets: 216 10*3/uL (ref 150–400)
RBC: 4.14 MIL/uL (ref 3.87–5.11)
RDW: 12.1 % (ref 11.5–15.5)
WBC: 8.4 10*3/uL (ref 4.0–10.5)

## 2015-05-22 LAB — BASIC METABOLIC PANEL
Anion gap: 9 (ref 5–15)
BUN: 13 mg/dL (ref 6–20)
CO2: 27 mmol/L (ref 22–32)
Calcium: 9.8 mg/dL (ref 8.9–10.3)
Chloride: 103 mmol/L (ref 101–111)
Creatinine, Ser: 0.81 mg/dL (ref 0.44–1.00)
GFR calc Af Amer: >60 mL/min (ref >60)
GFR calc non Af Amer: >60 mL/min (ref >60)
Glucose, Bld: 80 mg/dL (ref 65–99)
Potassium: 3.6 mmol/L (ref 3.5–5.1)
Sodium: 139 mmol/L (ref 135–145)

## 2015-05-22 MED ORDER — IOHEXOL 350 MG/ML SOLN
100.0000 mL | Freq: Once | INTRAVENOUS | Status: AC | PRN
  Administered 2015-05-22: 100 mL via INTRAVENOUS

## 2015-05-22 MED ORDER — ENOXAPARIN SODIUM 80 MG/0.8ML ~~LOC~~ SOLN
1.0000 mg/kg | Freq: Once | SUBCUTANEOUS | Status: AC
  Administered 2015-05-22: 70 mg via SUBCUTANEOUS
  Filled 2015-05-22: qty 0.8

## 2015-05-22 NOTE — ED Provider Notes (Signed)
CSN: [id number]     Arrival date & time 05/21/15  2321 History   First MD Initiated Contact with Patient 05/22/15 0136     Chief Complaint  Patient presents with  . Chest Pain     (Consider location/radiation/quality/duration/timing/severity/associated sxs/prior Treatment) Patient is a 24 y.o. female presenting with chest pain. The history is provided by the patient.  Chest Pain She has been complaining of pain in her right calf for the last 3 weeks. There has been some intermittent chest pain associated with this. Pain is in the upper sternal area. When pain is present, it is worse with deep breath. Calf pain has been getting gradually worse and she's not noticed any swelling. She rates pain at 3/10 currently but 7/10 at its worst. Of note, she is 6 weeks postpartum as with C-section done at [redacted] weeks gestation complicated by preeclampsia. She was evaluated shortly postpartum for possible pulmonary embolism because of dyspnea, but tests are negative. She is not taken anything to treat her pain. Of note, she is breast-feeding (baby is still in neonatal intensive care, and patient is pumping her breasts to supply milk).  Past Medical History  Diagnosis Date  . Asthma   . IBS (irritable bowel syndrome)   . Anxiety   . Panic attack   . Preeclampsia    Past Surgical History  Procedure Laterality Date  . Tendon repair    . Scleral buckle  08/09/2011    Procedure: SCLERAL BUCKLE;  Surgeon: Clent Demark Rankin;  Location: [facility];  Service: Ophthalmology;  Laterality: Left;  with cryo  . Cesarean section N/A 04/10/2015    Procedure: CESAREAN SECTION;  Surgeon: Crawford Givens, MD;  Location: [facility];  Service: Obstetrics;  Laterality: N/A;  . Eye surgery      laser   No family history on file. Social History  Substance Use Topics  . Smoking status: Never Smoker   . Smokeless tobacco: None  . Alcohol Use: No   OB History    Gravida Para Term Preterm AB TAB SAB Ectopic Multiple Living   1 1  1       0 1     Review of Systems  Cardiovascular: Positive for chest pain.  All other systems reviewed and are negative.     Allergies  Sulfa antibiotics  Home Medications   Prior to Admission medications   Medication Sig Start Date End Date Taking? Authorizing Provider  acetaminophen (TYLENOL) 500 MG tablet Take 1,000 mg by mouth every 6 (six) hours as needed for moderate pain.    Historical Provider, MD  albuterol (PROVENTIL HFA;VENTOLIN HFA) 108 (90 BASE) MCG/ACT inhaler Inhale 1-2 puffs into the lungs every 6 (six) hours as needed for wheezing or shortness of breath.    Historical Provider, MD  ALPRAZolam Duanne Moron) 0.5 MG tablet Take 1 tablet (0.5 mg total) by mouth 2 (two) times daily as needed for anxiety. 04/14/15   Lezlie Lye, NP  ibuprofen (ADVIL,MOTRIN) 600 MG tablet Take 1 tablet (600 mg total) by mouth every 6 (six) hours as needed. Patient taking differently: Take 600 mg by mouth every 6 (six) hours as needed for moderate pain.  04/13/15   Christophe Louis, MD  labetalol (NORMODYNE) 200 MG tablet Take 2 tablets (400 mg total) by mouth 2 (two) times daily. 04/14/15   Artist Pais Rasch, NP  LORazepam (ATIVAN) 1 MG tablet Take 2 tablets (2 mg total) by mouth every 8 (eight) hours as needed for anxiety. 04/19/15  Tresea Mall, CNM  oxyCODONE-acetaminophen (PERCOCET/ROXICET) 5-325 MG per tablet Take 1-2 tablets by mouth every 4 (four) hours as needed for severe pain (for pain scale greater than 7). Patient taking differently: Take 1 tablet by mouth every 4 (four) hours as needed for severe pain (for pain scale greater than 7).  04/13/15   Christophe Louis, MD  Prenatal Vit-Fe Fumarate-FA (PRENATAL MULTIVITAMIN) TABS tablet Take 1 tablet by mouth daily at 12 noon.    Historical Provider, MD  sertraline (ZOLOFT) 50 MG tablet Take 50 mg by mouth daily.    Historical Provider, MD   BP 128/91 mmHg  Pulse 71  Temp(Src) 97.8 F (36.6 C) (Oral)  Resp 20  Ht 5\' 6"  (1.676 m)  Wt 150 lb (68.04 kg)  BMI  24.22 kg/m2  SpO2 100% Physical Exam  Nursing note and vitals reviewed.  24 year old female, resting comfortably and in no acute distress. Vital signs are  significant for borderline hypertension. Oxygen saturation is 100%, which is normal. Head is normocephalic and atraumatic. PERRLA, EOMI. Oropharynx is clear. Neck is nontender and supple without adenopathy or JVD. Back is nontender and there is no CVA tenderness. Lungs are clear without rales, wheezes, or rhonchi. Chest is nontender. Heart has regular rate and rhythm without murmur. Abdomen is soft, flat, nontender without masses or hepatosplenomegaly and peristalsis is normoactive. Extremities have no cyanosis or edema, full range of motion is present. there is mild tenderness of the right calf and there is a positive Homans sign on the right. There is no cord palpable and no erythema or warmth. Right calf circumferences approximately 1 cm greater than left calf circumference. Skin is warm and dry without rash. Neurologic: Mental status is normal, cranial nerves are intact, there are no motor or sensory deficits.  ED Course  Procedures (including critical care time) Labs Review Results for orders placed or performed during the hospital encounter of 76/28/31  Basic metabolic panel  Result Value Ref Range   Sodium 139 135 - 145 mmol/L   Potassium 3.6 3.5 - 5.1 mmol/L   Chloride 103 101 - 111 mmol/L   CO2 27 22 - 32 mmol/L   Glucose, Bld 80 65 - 99 mg/dL   BUN 13 6 - 20 mg/dL   Creatinine, Ser 0.81 0.44 - 1.00 mg/dL   Calcium 9.8 8.9 - 10.3 mg/dL   GFR calc non Af Amer >60 >60 mL/min   GFR calc Af Amer >60 >60 mL/min   Anion gap 9 5 - 15  CBC with Differential  Result Value Ref Range   WBC 8.4 4.0 - 10.5 K/uL   RBC 4.14 3.87 - 5.11 MIL/uL   Hemoglobin 12.8 12.0 - 15.0 g/dL   HCT 37.4 36.0 - 46.0 %   MCV 90.3 78.0 - 100.0 fL   MCH 30.9 26.0 - 34.0 pg   MCHC 34.2 30.0 - 36.0 g/dL   RDW 12.1 11.5 - 15.5 %   Platelets 216 150  - 400 K/uL   Neutrophils Relative % 53 43 - 77 %   Neutro Abs 4.4 1.7 - 7.7 K/uL   Lymphocytes Relative 33 12 - 46 %   Lymphs Abs 2.8 0.7 - 4.0 K/uL   Monocytes Relative 11 3 - 12 %   Monocytes Absolute 0.9 0.1 - 1.0 K/uL   Eosinophils Relative 3 0 - 5 %   Eosinophils Absolute 0.3 0.0 - 0.7 K/uL   Basophils Relative 0 0 - 1 %  Basophils Absolute 0.0 0.0 - 0.1 K/uL   Imaging Review Ct Angio Chest Pe W/cm &/or Wo Cm  05/22/2015   CLINICAL DATA:  24 year old female with chest pain  EXAM: CT ANGIOGRAPHY CHEST WITH CONTRAST  TECHNIQUE: Multidetector CT imaging of the chest was performed using the standard protocol during bolus administration of intravenous contrast. Multiplanar CT image reconstructions and MIPs were obtained to evaluate the vascular anatomy.  CONTRAST:  181mL OMNIPAQUE IOHEXOL 350 MG/ML SOLN  COMPARISON:  Chest radiograph 04/30/2015  FINDINGS: The lungs are clear. There is no pleural effusion or pneumothorax. The central airways are patent.  The thoracic aorta is unremarkable. No CT evidence of pulmonary embolism. There is no hilar or mediastinal adenopathy. No cardiomegaly or pericardial effusion. Residual thymic tissue noted in the anterior mediastinum. The thyroid gland is unremarkable. The esophagus is collapsed.  No axillary adenopathy. The soft tissue chest wall and osseous structures are unremarkable. The visualized upper abdomen is unremarkable.  Review of the MIP images confirms the above findings.  IMPRESSION: No CT evidence of pulmonary embolism.   Electronically Signed   By: Anner Crete M.D.   On: 05/22/2015 02:41   I have personally reviewed and evaluated these images and lab results as part of my medical decision-making.   EKG Interpretation   Date/Time:  Friday May 21 2015 23:42:59 EDT Ventricular Rate:  67 PR Interval:  150 QRS Duration: 82 QT Interval:  380 QTC Calculation: 401 R Axis:   64 Text Interpretation:  Normal sinus rhythm Normal ECG When  compared with  ECG of 04/30/2015, No significant change was found Reconfirmed by [facility]   MD, Emberly Tomasso ([id number]) on 05/22/2015 1:36:38 AM      MDM   Final diagnoses:  Pain of right lower leg  Chest pain, unspecified chest pain type   Calf pain and intermittent chest pain in patient who is in the postpartum period she is at high risk for pulmonary embolism, so she will be sent for CT angiogram. If negative, she will need venous ultrasound to rule out DVT. Old records are reviewed confirming a C-section for preeclampsia 6 weeks ago and CT angiogram done 4 days postpartum.  CT angiogram is negative for pulmonary emboli. She's given an injection of enoxaparin and will return for venous ultrasound in the morning.  Delora Fuel, MD 84/13/24 [id number]

## 2015-05-22 NOTE — Discharge Instructions (Signed)
Return for venous ultrasound to see if there are any blood clots in your leg.   Chest Pain (Nonspecific) It is often hard to give a specific diagnosis for the cause of chest pain. There is always a chance that your pain could be related to something serious, such as a heart attack or a blood clot in the lungs. You need to follow up with your health care provider for further evaluation. CAUSES   Heartburn.  Pneumonia or bronchitis.  Anxiety or stress.  Inflammation around your heart (pericarditis) or lung (pleuritis or pleurisy).  A blood clot in the lung.  A collapsed lung (pneumothorax). It can develop suddenly on its own (spontaneous pneumothorax) or from trauma to the chest.  Shingles infection (herpes zoster virus). The chest wall is composed of bones, muscles, and cartilage. Any of these can be the source of the pain.  The bones can be bruised by injury.  The muscles or cartilage can be strained by coughing or overwork.  The cartilage can be affected by inflammation and become sore (costochondritis). DIAGNOSIS  Lab tests or other studies may be needed to find the cause of your pain. Your health care provider may have you take a test called an ambulatory electrocardiogram (ECG). An ECG records your heartbeat patterns over a 24-hour period. You may also have other tests, such as:  Transthoracic echocardiogram (TTE). During echocardiography, sound waves are used to evaluate how blood flows through your heart.  Transesophageal echocardiogram (TEE).  Cardiac monitoring. This allows your health care provider to monitor your heart rate and rhythm in real time.  Holter monitor. This is a portable device that records your heartbeat and can help diagnose heart arrhythmias. It allows your health care provider to track your heart activity for several days, if needed.  Stress tests by exercise or by giving medicine that makes the heart beat faster. TREATMENT   Treatment depends on  what may be causing your chest pain. Treatment may include:  Acid blockers for heartburn.  Anti-inflammatory medicine.  Pain medicine for inflammatory conditions.  Antibiotics if an infection is present.  You may be advised to change lifestyle habits. This includes stopping smoking and avoiding alcohol, caffeine, and chocolate.  You may be advised to keep your head raised (elevated) when sleeping. This reduces the chance of acid going backward from your stomach into your esophagus. Most of the time, nonspecific chest pain will improve within 2-3 days with rest and mild pain medicine.  HOME CARE INSTRUCTIONS   If antibiotics were prescribed, take them as directed. Finish them even if you start to feel better.  For the next few days, avoid physical activities that bring on chest pain. Continue physical activities as directed.  Do not use any tobacco products, including cigarettes, chewing tobacco, or electronic cigarettes.  Avoid drinking alcohol.  Only take medicine as directed by your health care provider.  Follow your health care provider's suggestions for further testing if your chest pain does not go away.  Keep any follow-up appointments you made. If you do not go to an appointment, you could develop lasting (chronic) problems with pain. If there is any problem keeping an appointment, call to reschedule. SEEK MEDICAL CARE IF:   Your chest pain does not go away, even after treatment.  You have a rash with blisters on your chest.  You have a fever. SEEK IMMEDIATE MEDICAL CARE IF:   You have increased chest pain or pain that spreads to your arm, neck, jaw, back,  or abdomen.  You have shortness of breath.  You have an increasing cough, or you cough up blood.  You have severe back or abdominal pain.  You feel nauseous or vomit.  You have severe weakness.  You faint.  You have chills. This is an emergency. Do not wait to see if the pain will go away. Get medical  help at once. Call your local emergency services (911 in [place] Do not drive yourself to the hospital. MAKE SURE YOU:   Understand these instructions.  Will watch your condition.  Will get help right away if you are not doing well or get worse. Document Released: 06/07/2005 Document Revised: 09/02/2013 Document Reviewed: 04/02/2008 [facility] Patient Information 2015 [place], [place]. This information is not intended to replace advice given to you by your health care provider. Make sure you discuss any questions you have with your health care provider.

## 2015-05-22 NOTE — ED Notes (Signed)
Patient c/o right leg, "tingling and numbness" patient states she wants to be checked for a blood clot. A&O at this time. Denies SOB.

## 2015-05-22 NOTE — ED Notes (Signed)
Patient verbalizes understanding of discharge instructions, home care, and follow up appointment. Patient ambulatory out of department at this time with spouse.

## 2015-05-26 ENCOUNTER — Encounter (HOSPITAL_COMMUNITY): Payer: Self-pay | Admitting: Emergency Medicine

## 2015-05-26 ENCOUNTER — Emergency Department (HOSPITAL_COMMUNITY): Payer: BLUE CROSS/BLUE SHIELD

## 2015-05-26 ENCOUNTER — Emergency Department (HOSPITAL_COMMUNITY)
Admission: EM | Admit: 2015-05-26 | Discharge: 2015-05-27 | Disposition: A | Discharge status: Home / Self Care | Payer: BLUE CROSS/BLUE SHIELD | Attending: Emergency Medicine | Admitting: Emergency Medicine

## 2015-05-26 DIAGNOSIS — F410 Panic disorder [episodic paroxysmal anxiety] without agoraphobia: Secondary | ICD-10-CM | POA: Insufficient documentation

## 2015-05-26 DIAGNOSIS — R0789 Other chest pain: Secondary | ICD-10-CM | POA: Insufficient documentation

## 2015-05-26 DIAGNOSIS — J45909 Unspecified asthma, uncomplicated: Secondary | ICD-10-CM | POA: Diagnosis not present

## 2015-05-26 DIAGNOSIS — Z8719 Personal history of other diseases of the digestive system: Secondary | ICD-10-CM | POA: Insufficient documentation

## 2015-05-26 DIAGNOSIS — R079 Chest pain, unspecified: Secondary | ICD-10-CM | POA: Diagnosis present

## 2015-05-26 DIAGNOSIS — R42 Dizziness and giddiness: Secondary | ICD-10-CM

## 2015-05-26 DIAGNOSIS — Z79899 Other long term (current) drug therapy: Secondary | ICD-10-CM | POA: Insufficient documentation

## 2015-05-26 NOTE — ED Notes (Signed)
Pt states she is having intermittent chest pain to the middle of her chest that started about an hour and a half ago   Pt states it is sharp in nature and then she has episodes of pain to the sides of her chest that area achy  Pt states she feels light headed and dizzy  Pt states she is also having pain in her legs that she has been having for quite some time and had a doppler of her right leg that was negative for a DVT

## 2015-05-27 ENCOUNTER — Emergency Department (HOSPITAL_COMMUNITY): Payer: BLUE CROSS/BLUE SHIELD

## 2015-05-27 LAB — BASIC METABOLIC PANEL
Anion gap: 8 (ref 5–15)
BUN: 14 mg/dL (ref 6–20)
CO2: 26 mmol/L (ref 22–32)
Calcium: 10.5 mg/dL — ABNORMAL HIGH (ref 8.9–10.3)
Chloride: 105 mmol/L (ref 101–111)
Creatinine, Ser: 0.85 mg/dL (ref 0.44–1.00)
GFR calc Af Amer: >60 mL/min (ref >60)
GFR calc non Af Amer: >60 mL/min (ref >60)
Glucose, Bld: 90 mg/dL (ref 65–99)
Potassium: 3.9 mmol/L (ref 3.5–5.1)
Sodium: 139 mmol/L (ref 135–145)

## 2015-05-27 LAB — CBC
HCT: 38.6 % (ref 36.0–46.0)
Hemoglobin: 13.1 g/dL (ref 12.0–15.0)
MCH: 30.3 pg (ref 26.0–34.0)
MCHC: 33.9 g/dL (ref 30.0–36.0)
MCV: 89.4 fL (ref 78.0–100.0)
Platelets: 247 10*3/uL (ref 150–400)
RBC: 4.32 MIL/uL (ref 3.87–5.11)
RDW: 12.1 % (ref 11.5–15.5)
WBC: 8.5 10*3/uL (ref 4.0–10.5)

## 2015-05-27 LAB — I-STAT TROPONIN, ED: Troponin i, poc: 0 ng/mL (ref 0.00–0.08)

## 2015-05-27 NOTE — Discharge Instructions (Signed)
°Chest Pain (Nonspecific) °It is often hard to give a specific diagnosis for the cause of chest pain. There is always a chance that your pain could be related to something serious, such as a heart attack or a blood clot in the lungs. You need to follow up with your health care provider for further evaluation. °CAUSES  °· Heartburn. °· Pneumonia or bronchitis. °· Anxiety or stress. °· Inflammation around your heart (pericarditis) or lung (pleuritis or pleurisy). °· A blood clot in the lung. °· A collapsed lung (pneumothorax). It can develop suddenly on its own (spontaneous pneumothorax) or from trauma to the chest. °· Shingles infection (herpes zoster virus). °The chest wall is composed of bones, muscles, and cartilage. Any of these can be the source of the pain. °· The bones can be bruised by injury. °· The muscles or cartilage can be strained by coughing or overwork. °· The cartilage can be affected by inflammation and become sore (costochondritis). °DIAGNOSIS  °Lab tests or other studies may be needed to find the cause of your pain. Your health care provider may have you take a test called an ambulatory electrocardiogram (ECG). An ECG records your heartbeat patterns over a 24-hour period. You may also have other tests, such as: °· Transthoracic echocardiogram (TTE). During echocardiography, sound waves are used to evaluate how blood flows through your heart. °· Transesophageal echocardiogram (TEE). °· Cardiac monitoring. This allows your health care provider to monitor your heart rate and rhythm in real time. °· Holter monitor. This is a portable device that records your heartbeat and can help diagnose heart arrhythmias. It allows your health care provider to track your heart activity for several days, if needed. °· Stress tests by exercise or by giving medicine that makes the heart beat faster. °TREATMENT  °· Treatment depends on what may be causing your chest pain. Treatment may include: °· Acid blockers for  heartburn. °· Anti-inflammatory medicine. °· Pain medicine for inflammatory conditions. °· Antibiotics if an infection is present. °· You may be advised to change lifestyle habits. This includes stopping smoking and avoiding alcohol, caffeine, and chocolate. °· You may be advised to keep your head raised (elevated) when sleeping. This reduces the chance of acid going backward from your stomach into your esophagus. °Most of the time, nonspecific chest pain will improve within 2-3 days with rest and mild pain medicine.  °HOME CARE INSTRUCTIONS  °· If antibiotics were prescribed, take them as directed. Finish them even if you start to feel better. °· For the next few days, avoid physical activities that bring on chest pain. Continue physical activities as directed. °· Do not use any tobacco products, including cigarettes, chewing tobacco, or electronic cigarettes. °· Avoid drinking alcohol. °· Only take medicine as directed by your health care provider. °· Follow your health care provider's suggestions for further testing if your chest pain does not go away. °· Keep any follow-up appointments you made. If you do not go to an appointment, you could develop lasting (chronic) problems with pain. If there is any problem keeping an appointment, call to reschedule. °SEEK MEDICAL CARE IF:  °· Your chest pain does not go away, even after treatment. °· You have a rash with blisters on your chest. °· You have a fever. °SEEK IMMEDIATE MEDICAL CARE IF:  °· You have increased chest pain or pain that spreads to your arm, neck, jaw, back, or abdomen. °· You have shortness of breath. °· You have an increasing cough, or you cough   up blood. °· You have severe back or abdominal pain. °· You feel nauseous or vomit. °· You have severe weakness. °· You faint. °· You have chills. °This is an emergency. Do not wait to see if the pain will go away. Get medical help at once. Call your local emergency services (911 in [place] Do not drive  yourself to the hospital. °MAKE SURE YOU:  °· Understand these instructions. °· Will watch your condition. °· Will get help right away if you are not doing well or get worse. °Document Released: 06/07/2005 Document Revised: 09/02/2013 Document Reviewed: 04/02/2008 °[employer]® Patient Information ©2015 [employer]. This information is not intended to replace advice given to you by your health care provider. Make sure you discuss any questions you have with your health care provider. ° ° °Dizziness °Dizziness is a common problem. It is a feeling of unsteadiness or light-headedness. You may feel like you are about to faint. Dizziness can lead to injury if you stumble or fall. A person of any age group can suffer from dizziness, but dizziness is more common in older adults. °CAUSES  °Dizziness can be caused by many different things, including: °· Middle ear problems. °· Standing for too long. °· Infections. °· An allergic reaction. °· Aging. °· An emotional response to something, such as the sight of blood. °· Side effects of medicines. °· Tiredness. °· Problems with circulation or blood pressure. °· Excessive use of alcohol or medicines, or illegal drug use. °· Breathing too fast (hyperventilation). °· An irregular heart rhythm (arrhythmia). °· A low red blood cell count (anemia). °· Pregnancy. °· Vomiting, diarrhea, fever, or other illnesses that cause body fluid loss (dehydration). °· Diseases or conditions such as Parkinson's disease, high blood pressure (hypertension), diabetes, and thyroid problems. °· Exposure to extreme heat. °DIAGNOSIS  °Your health care provider will ask about your symptoms, perform a physical exam, and perform an electrocardiogram (ECG) to record the electrical activity of your heart. Your health care provider may also perform other heart or blood tests to determine the cause of your dizziness. These may include: °· Transthoracic echocardiogram (TTE). During echocardiography, sound waves are  used to evaluate how blood flows through your heart. °· Transesophageal echocardiogram (TEE). °· Cardiac monitoring. This allows your health care provider to monitor your heart rate and rhythm in real time. °· Holter monitor. This is a portable device that records your heartbeat and can help diagnose heart arrhythmias. It allows your health care provider to track your heart activity for several days if needed. °· Stress tests by exercise or by giving medicine that makes the heart beat faster. °TREATMENT  °Treatment of dizziness depends on the cause of your symptoms and can vary greatly. °HOME CARE INSTRUCTIONS  °· Drink enough fluids to keep your urine clear or pale yellow. This is especially important in very hot weather. In older adults, it is also important in cold weather. °· Take your medicine exactly as directed if your dizziness is caused by medicines. When taking blood pressure medicines, it is especially important to get up slowly. °¨ Rise slowly from chairs and steady yourself until you feel okay. °¨ In the morning, first sit up on the side of the bed. When you feel okay, stand slowly while holding onto something until you know your balance is fine. °· Move your legs often if you need to stand in one place for a long time. Tighten and relax your muscles in your legs while standing. °· Have someone stay   with you for 1-2 days if dizziness continues to be a problem. Do this until you feel you are well enough to stay alone. Have the person call your health care provider if he or she notices changes in you that are concerning. °· Do not drive or use heavy machinery if you feel dizzy. °· Do not drink alcohol. °SEEK IMMEDIATE MEDICAL CARE IF:  °· Your dizziness or light-headedness gets worse. °· You feel nauseous or vomit. °· You have problems talking, walking, or using your arms, hands, or legs. °· You feel weak. °· You are not thinking clearly or you have trouble forming sentences. It may take a friend or  family member to notice this. °· You have chest pain, abdominal pain, shortness of breath, or sweating. °· Your vision changes. °· You notice any bleeding. °· You have side effects from medicine that seems to be getting worse rather than better. °MAKE SURE YOU:  °· Understand these instructions. °· Will watch your condition. °· Will get help right away if you are not doing well or get worse. °Document Released: 02/21/2001 Document Revised: 09/02/2013 Document Reviewed: 03/17/2011 °[employer]® Patient Information ©2015 [employer]. This information is not intended to replace advice given to you by your health care provider. Make sure you discuss any questions you have with your health care provider. ° ° °

## 2015-05-27 NOTE — ED Provider Notes (Signed)
CSN: [id number]     Arrival date & time 05/26/15  2324 History  This chart was scribed for Julianne Rice, MD by Randa Evens, ED Scribe. This patient was seen in room WA09/WA09 and the patient's care was started at 1:25 AM.    Chief Complaint  Patient presents with  . Chest Pain   The history is provided by the patient. No language interpreter was used.   HPI Comments: Kathy Zimmerman is a 23 y.o. female who presents to the Emergency Department complaining of intermittent sharp CP onset 2-3 hours PTA. Pt does report Hx of anxiety. Pt states she started having CP after feeling light headed. She states that she may have been anxious about feeling light headed which may have caused the CP. Pt states that she initially felt light headed after "pumping" for her new born. Pt states she has normal fluid intake.  She states that the CP is worse intermittently with deep breathing. Pt does question whether her medications (labetalol and klonopin) are the cause of her symptoms. Denies abdominal pain, leg swelling or other related symptoms. Recent evaluation emergency department for PE/DVT was negative.  Past Medical History  Diagnosis Date  . Asthma   . IBS (irritable bowel syndrome)   . Anxiety   . Panic attack   . Preeclampsia    Past Surgical History  Procedure Laterality Date  . Tendon repair    . Scleral buckle  08/09/2011    Procedure: SCLERAL BUCKLE;  Surgeon: Clent Demark Rankin;  Location: [facility];  Service: Ophthalmology;  Laterality: Left;  with cryo  . Cesarean section N/A 04/10/2015    Procedure: CESAREAN SECTION;  Surgeon: Crawford Givens, MD;  Location: [facility];  Service: Obstetrics;  Laterality: N/A;  . Eye surgery      laser   Family History  Problem Relation Age of Onset  . Thyroid disease Other   . CAD Other    Social History  Substance Use Topics  . Smoking status: Never Smoker   . Smokeless tobacco: None  . Alcohol Use: No   OB History    Gravida Para Term Preterm AB TAB  SAB Ectopic Multiple Living   1 1  1      0 1     Review of Systems  Constitutional: Negative for fever and chills.  Respiratory: Negative for chest tightness and shortness of breath.   Cardiovascular: Positive for chest pain. Negative for leg swelling.  Gastrointestinal: Negative for nausea, vomiting and abdominal pain.  Musculoskeletal: Negative for back pain, neck pain and neck stiffness.  Skin: Negative for rash and wound.  Neurological: Positive for dizziness and light-headedness. Negative for syncope, weakness, numbness and headaches.  Psychiatric/Behavioral: The patient is nervous/anxious.   All other systems reviewed and are negative.     Allergies  Sulfa antibiotics  Home Medications   Prior to Admission medications   Medication Sig Start Date End Date Taking? Authorizing Provider  acetaminophen (TYLENOL) 500 MG tablet Take 1,000 mg by mouth every 6 (six) hours as needed for moderate pain.   Yes Historical Provider, MD  ALPRAZolam Duanne Moron) 0.5 MG tablet Take 1 tablet (0.5 mg total) by mouth 2 (two) times daily as needed for anxiety. 04/14/15  Yes Artist Pais Rasch, NP  CLONAZEPAM PO Take 1 tablet by mouth 3 (three) times daily.   Yes Historical Provider, MD  ibuprofen (ADVIL,MOTRIN) 600 MG tablet Take 1 tablet (600 mg total) by mouth every 6 (six) hours as needed. Patient taking  differently: Take 600 mg by mouth every 6 (six) hours as needed for moderate pain.  04/13/15  Yes Christophe Louis, MD  labetalol (NORMODYNE) 200 MG tablet Take 2 tablets (400 mg total) by mouth 2 (two) times daily. Patient taking differently: Take 100 mg by mouth 2 (two) times daily.  04/14/15  Yes Lezlie Lye, NP  oxyCODONE-acetaminophen (PERCOCET/ROXICET) 5-325 MG per tablet Take 1-2 tablets by mouth every 4 (four) hours as needed for severe pain (for pain scale greater than 7). Patient taking differently: Take 1 tablet by mouth every 4 (four) hours as needed for severe pain (for pain scale greater than 7).   04/13/15  Yes Christophe Louis, MD  Prenatal Vit-Fe Fumarate-FA (PRENATAL MULTIVITAMIN) TABS tablet Take 1 tablet by mouth daily at 12 noon.   Yes Historical Provider, MD  sertraline (ZOLOFT) 50 MG tablet Take 50 mg by mouth daily.   Yes Historical Provider, MD  albuterol (PROVENTIL HFA;VENTOLIN HFA) 108 (90 BASE) MCG/ACT inhaler Inhale 1-2 puffs into the lungs every 6 (six) hours as needed for wheezing or shortness of breath.    Historical Provider, MD  LORazepam (ATIVAN) 1 MG tablet Take 2 tablets (2 mg total) by mouth every 8 (eight) hours as needed for anxiety. Patient not taking: Reported on 05/27/2015 04/19/15   Tresea Mall, CNM   BP 111/76 mmHg  Pulse 78  Temp(Src) 97.9 F (36.6 C) (Oral)  Resp 16  Ht 5\' 6"  (1.676 m)  Wt 147 lb (66.679 kg)  BMI 23.74 kg/m2  SpO2 100%  Breastfeeding? Yes   Physical Exam  Constitutional: She is oriented to person, place, and time. She appears well-developed and well-nourished. No distress.  HENT:  Head: Normocephalic and atraumatic.  Mouth/Throat: Oropharynx is clear and moist.  Eyes: EOM are normal. Pupils are equal, round, and reactive to light.  Neck: Normal range of motion. Neck supple.  Cardiovascular: Normal rate and regular rhythm.   Pulmonary/Chest: Effort normal and breath sounds normal. No respiratory distress. She has no wheezes. She has no rales. She exhibits no tenderness.  Abdominal: Soft. Bowel sounds are normal. She exhibits no distension and no mass. There is no tenderness. There is no rebound and no guarding.  Musculoskeletal: Normal range of motion. She exhibits no edema or tenderness.  NO calf swelling or tenderness.  Neurological: She is alert and oriented to person, place, and time.  5/5 motor in all extremities. Sensation fully intact.  Skin: Skin is warm and dry. No rash noted. No erythema.  Psychiatric: She has a normal mood and affect. Her behavior is normal.  Nursing note and vitals reviewed.   ED Course  Procedures  (including critical care time) DIAGNOSTIC STUDIES: Oxygen Saturation is 100% on RA, normal by my interpretation.    COORDINATION OF CARE: 2:10 AM-Discussed treatment plan with pt at bedside and pt agreed to plan.     Labs Review Labs Reviewed  BASIC METABOLIC PANEL - Abnormal; Notable for the following:    Calcium 10.5 (*)    All other components within normal limits  CBC  I-STAT TROPOININ, ED    Imaging Review Dg Chest 2 View  05/27/2015   CLINICAL DATA:  Intermittent chest pain for 90 minutes. Dizziness and lightheadedness.  EXAM: CHEST  2 VIEW  COMPARISON:  Chest CT 5 days prior 05/22/2015  FINDINGS: The cardiomediastinal contours are normal. The lungs are clear. Pulmonary vasculature is normal. No consolidation, pleural effusion, or pneumothorax. No acute osseous abnormalities are seen.  IMPRESSION:  No acute pulmonary process.   Electronically Signed   By: Jeb Levering M.D.   On: 05/27/2015 00:46      EKG Interpretation   Date/Time:  Wednesday May 26 2015 23:40:41 EDT Ventricular Rate:  71 PR Interval:  143 QRS Duration: 77 QT Interval:  370 QTC Calculation: 402 R Axis:   64 Text Interpretation:  Sinus rhythm Low voltage, precordial leads Baseline  wander in lead(s) II III aVR aVL aVF V1 V5 V6 Confirmed by Vaeda Westall  MD,  Jacquise Rarick ([id number]) on 05/27/2015 1:28:18 AM      MDM   Final diagnoses:  Atypical chest pain  Episodic lightheadedness      I personally performed the services described in this documentation, which was scribed in my presence. The recorded information has been reviewed and is accurate.  She has been evaluated several times this month the emergency department for chest pain. PE has been ruled out with CT angiogram. EKG without any evidence of ischemia. Initial troponin is normal. Patient states the pain is pleuritic in nature and worse with deep breathing. It is episodic. I have very low suspicion for coronary artery disease. I recommended  patient follow-up with cardiology. She states she has occasional palpitations and may benefit from Holter monitor. Return precautions have been given. Patient and husband agree with the plan.     Julianne Rice, MD 05/27/15 [phone]

## 2015-05-31 ENCOUNTER — Ambulatory Visit (INDEPENDENT_AMBULATORY_CARE_PROVIDER_SITE_OTHER): Payer: BLUE CROSS/BLUE SHIELD | Admitting: Cardiology

## 2015-05-31 ENCOUNTER — Encounter: Payer: Self-pay | Admitting: Cardiology

## 2015-05-31 VITALS — BP 118/78 | HR 93 | Ht 66.0 in | Wt 150.0 lb

## 2015-05-31 DIAGNOSIS — O1493 Unspecified pre-eclampsia, third trimester: Secondary | ICD-10-CM | POA: Diagnosis not present

## 2015-05-31 DIAGNOSIS — R002 Palpitations: Secondary | ICD-10-CM | POA: Diagnosis not present

## 2015-05-31 DIAGNOSIS — F419 Anxiety disorder, unspecified: Secondary | ICD-10-CM

## 2015-05-31 DIAGNOSIS — R0789 Other chest pain: Secondary | ICD-10-CM

## 2015-05-31 NOTE — Progress Notes (Signed)
Cardiology Office Note   Date:  05/31/2015   ID:  Kathy Zimmerman, DOB 1991/01/08, MRN [id number]  PCP:  Delphina Cahill, MD  Cardiologist:   Candee Furbish, MD       History of Present Illness: Kathy Zimmerman is a 24 y.o. female who presents for evaluation of chest pain. She was in the emergency department on 05/22/15 with right calf pain, chest pain over the past 3 weeks. It had been gradually getting worse. No swelling. 6 weeks postpartum for C-section done at 29 weeks complicated by preeclampsia. Test were negative for pulmonary embolism and no evidence of DVT  Personally reviewed CT scan-normal-appearing coronary arteries, no coronary artery calcium location.  Been to ER several times.  Sometimes heart flutters twice in a row.  If twists her torso. Feels pulling. When she begins to feel chest discomfort, this worries her and her anxiety becomes worse.  She has been taking low-dose labetalol 100 mg in the morning, 100 mg at night.    Past Medical History  Diagnosis Date  . Asthma   . IBS (irritable bowel syndrome)   . Anxiety   . Panic attack   . Preeclampsia     Past Surgical History  Procedure Laterality Date  . Tendon repair    . Scleral buckle  08/09/2011    Procedure: SCLERAL BUCKLE;  Surgeon: Clent Demark Rankin;  Location: [facility];  Service: Ophthalmology;  Laterality: Left;  with cryo  . Cesarean section N/A 04/10/2015    Procedure: CESAREAN SECTION;  Surgeon: Crawford Givens, MD;  Location: [facility];  Service: Obstetrics;  Laterality: N/A;  . Eye surgery      laser     Current Outpatient Prescriptions  Medication Sig Dispense Refill  . albuterol (PROVENTIL HFA;VENTOLIN HFA) 108 (90 BASE) MCG/ACT inhaler Inhale 1-2 puffs into the lungs every 6 (six) hours as needed for wheezing or shortness of breath.    . ALPRAZolam (XANAX) 0.5 MG tablet Take 1 tablet (0.5 mg total) by mouth 2 (two) times daily as needed for anxiety. 20 tablet 0  . CLONAZEPAM PO Take 1 tablet by mouth 3  (three) times daily.    . norethindrone-ethinyl estradiol (FEMHRT 1/5) 1-5 MG-MCG TABS Take 1 tablet by mouth daily.    . Prenatal Vit-Fe Fumarate-FA (PRENATAL MULTIVITAMIN) TABS tablet Take 1 tablet by mouth daily at 12 noon.    . sertraline (ZOLOFT) 50 MG tablet Take 50 mg by mouth daily.     No current facility-administered medications for this visit.    Allergies:   Sulfa antibiotics    Social History:  The patient  reports that she has never smoked. She does not have any smokeless tobacco history on file. She reports that she does not drink alcohol or use illicit drugs.   Family History:  The patient'sfamily history includes CAD in her other; Thyroid disease in her other. Grandparents with heart dz.   ROS:  Please see the history of present illness.   Otherwise, review of systems are positive for anxiety.   All other systems are reviewed and negative.    PHYSICAL EXAM: VS:  BP 118/78 mmHg  Pulse 93  Ht 5\' 6"  (1.676 m)  Wt 150 lb (68.04 kg)  BMI 24.22 kg/m2  SpO2 98% , BMI Body mass index is 24.22 kg/(m^2). GEN: Well nourished, well developed, in no acute distress HEENT: normal Neck: no JVD, carotid bruits, or masses Cardiac: RRR; no murmurs, rubs, or gallops,no edema  Respiratory:  clear to auscultation bilaterally, normal work of breathing GI: soft, nontender, nondistended, + BS MS: no deformity or atrophy Skin: warm and dry, no rash Neuro:  Strength and sensation are intact Psych: euthymic mood, full affect   EKG:  EKG is not ordered today. Multiple prior EKGs personally viewed and are normal   Recent Labs: 04/11/2015: Magnesium 5.5* 04/30/2015: ALT 14 05/27/2015: BUN 14; Creatinine, Ser 0.85; Hemoglobin 13.1; Platelets 247; Potassium 3.9; Sodium 139    Lipid Panel No results found for: CHOL, TRIG, HDL, CHOLHDL, VLDL, LDLCALC, LDLDIRECT    Wt Readings from Last 3 Encounters:  05/31/15 150 lb (68.04 kg)  05/26/15 147 lb (66.679 kg)  05/21/15 150 lb (68.04 kg)       Other studies Reviewed: Additional studies/ records that were reviewed today include: ER visit, lab work, CT scan. Review of the above records demonstrates: as above   ASSESSMENT AND PLAN:  1.  Atypical chest pain-likely musculoskeletal. EKG is normal. CT scan of chest was normal. I personally viewed this and there does not appear to be any coronary calcification or prominent with coronary artery disease. Anxiety is likely provoking symptoms as well. No further cardiac testing is needed. Reassurance. Try to decrease caffeine use overall.  2. Preeclampsia/hypertension-her blood pressure currently is quite low. At times she is feeling dizziness secondary to low blood pressure. She is on low-dose labetalol currently residual from prior preeclampsia. I think that it would be reasonable for her to taper off of her labetalol at this point. She may take 100 mg in the evening over the next 3 days and then stop her medication. She will periodically check her blood pressure at home. If her blood pressure increases, she may need to restart.  3. Anxiety-Xanax, sertraline.  4. Palpitations - 2 beats at night occasionally, usually in the evening hours. Likely PVCs or PACs. Benign. No high-risk symptoms such as syncope.   Current medicines are reviewed at length with the patient today.  The patient does not have concerns regarding medicines.  The following changes have been made:  Stopping Labetalol  Labs/ tests ordered today include: none  No orders of the defined types were placed in this encounter.    Disposition:   [place] as needed  Bobby Rumpf, MD  05/31/2015 11:59 AM    [facility] [address], [place], [place]  [address] Phone: [phone]; Fax: [phone]

## 2015-05-31 NOTE — Patient Instructions (Signed)
Medication Instructions:  The current medical regimen is effective;  continue present plan and medications.  Follow-Up: Follow up as needed.  Thank you for choosing [facility]!!     

## 2015-06-08 ENCOUNTER — Ambulatory Visit: Payer: Self-pay

## 2015-06-08 NOTE — Lactation Note (Signed)
This note was copied from the chart of [facility]. Lactation Consultation Note  Follow up with mom for BF session. Mom has long shafted nipples that are easy for Banks to latch onto.  Infant was alert and active. He latched easily in the cross cradle and the football hold on the left breast. Infant with vigorous sucking and intermittent swallows heard. Mom is able to latch infant independently. She massaged/ compressed breasts with feeding. He did take frequent breaks and relatched easily. Discussed with mom that his feeding was normal for a preterm infant. Enc her to allow him to practice BF as NICU allows and to keep up the good work. Mom reports she does pump briefly ac as she produces a lot of milk, discussed this was a good idea for him at this stage. Banks was sleepy post feed and was gavage fed remainder of feeding. Mom asked many great questions and wanted to know how we know how much he is getting, discussed pre/post weights as a way to determine that. Told mom that he may not take the same amount at each feeding and that due to size and gestational age, he may or may not transfer milk well and to not be worried about the amounts of transfer as long as he is receiving supplement.  Enc. Them that practice is still good for him. Parents voiced understanding. Baby did a lot of non nutritive sucking at the end of the feeding, explained the difference to mom. Enc. Given and told parents to call for questions/concerns.   Patient Name: Kathy Zimmerman ZMOQH'U Date: 06/08/2015 Reason for consult: Follow-up assessment;NICU baby;Infant < 6lbs   Maternal Data    Feeding Feeding Type: Breast Milk Length of feed: 30 min  LATCH Score/Interventions Latch: Repeated attempts needed to sustain latch, nipple held in mouth throughout feeding, stimulation needed to elicit sucking reflex. Intervention(s): Adjust position;Assist with latch  Audible Swallowing: Spontaneous and intermittent  Type of  Nipple: Everted at rest and after stimulation  Comfort (Breast/Nipple): Soft / non-tender     Hold (Positioning): No assistance needed to correctly position infant at breast.  LATCH Score: 9  Lactation Tools Discussed/Used     Consult Status Consult Status: PRN Follow-up type: In-patient    Debby Freiberg Jaylissa Felty 06/08/2015, 3:26 PM

## 2015-06-16 ENCOUNTER — Ambulatory Visit: Payer: Self-pay

## 2015-06-16 NOTE — Lactation Note (Signed)
This note was copied from the chart of [facility]. Lactation Consultation Note  Patient Name: Kathy Zimmerman BMWUX'L Date: 06/16/2015 Reason for consult: Follow-up assessment;NICU baby  NICU baby 57 months old. Called to NICU to examine mom's breasts. Baby is being treated for oral yeast. Mom's nipples shiny, red, and itching. Enc mom that she needs to call HCP for treatment. Discussed with mom that even if she did not have symptoms, it is recommended that she be treated while baby being treated in order not to keep reinfecting one another. Discussed hygiene and cleaning practices to prevent reinfection.  Maternal Data    Feeding    LATCH Score/Interventions                      Lactation Tools Discussed/Used     Consult Status Consult Status: PRN    Inocente Salles 06/16/2015, 2:59 PM

## 2015-06-16 NOTE — Lactation Note (Signed)
This note was copied from the chart of [facility]. Lactation Consultation Note Received call from Dr. Patrici Ranks regarding treatment for yeast for mom's breasts due to baby's treatment.  Dr. Patrici Ranks will order topical for mom's breast and per Dr. Pati Gallo online information she will add oral diflucan if needed.  Patient Name: Kathy Zimmerman [id number] Date: 06/16/2015 Reason for consult: Follow-up assessment;NICU baby   Maternal Data    Feeding Feeding Type: Breast Milk Nipple Type: Slow - flow Length of feed: 25 min  LATCH Score/Interventions                      Lactation Tools Discussed/Used     Consult Status Consult Status: PRN    Justice Britain 06/16/2015, 5:58 PM

## 2015-06-17 ENCOUNTER — Ambulatory Visit: Payer: Self-pay

## 2015-06-17 NOTE — Lactation Note (Signed)
This note was copied from the chart of [name]. Lactation Consultation Note  Patient Name: Kathy Zimmerman [id number] Date: 06/17/2015   RN called [name] on phone to speak with mom.  Mom stated 64 week old NICU infant has thrush and spoke with LC about current fungal infection signs and symptoms she is exhibiting.  According to RN infant has latched a few times last week, but mom is exclusively pumping at this time.  Mom c/o "pins and needles" pain with pumping and after pumping; using #27 flanges which is an appropriate size based on nipple size.   States her nipples are more pink than usual.  Mom stated OB called a nipple cream (Monistat) to pharmacy for her but nothing orally.  LC encouraged mom to contact OB again about an oral antifungal.  Also told mom that if nipples and pain do not improved over the next few days, then mom would need to seek further consult from [facility] about possible secondary bacterial infection.  Mom inquired about giving milk to infant; LC told mom to give most current milk to infant and not to store current milk for more than 1-2 days at a time since a fungal infection can continue to live in stored milk.  Explained rationale that both she and baby must be treated and heal together or the infection can continue to be passed back-and-forth between mom and baby via breastmilk &/or latching.  LC could not come visit mom at time of phone call.  RN to contact [facility] when mom returns to hospital this evening so LC can assess breast tissue.    Consulted with mom at 54 - LC assessed mom's nipples which are very pink and irritated in color extending beyond base of nipple with a pink ring around nipple on areola with a slightly shiny appearance.  Mom stated this is not her normal color which is usually brownish grey nipples.  [place] suspects a fungal infection as mom does exhibit S&S with a suspect appearance.  Mom stated she contacted OB earlier today about an oral antifungal after discussing  with LC on phone; stated OB will call her back tomorrow about prescription.  Discussed with mom option to use all natural agents such as olive oil or coconut oil on nipples and areola since both of these will kill fungal infections.  Also discussed cleaning pumping equipment and clothing to prevent re-infection.  RN stated infant is currently being treated with oral nystatin.  RN states infant has been very gassy with a change in stooling pattern.  NICU began using fresh milk as an attempt to decrease gassiness.    LC Recommends: 1.  All Purpose Nipple Ointment (topical) from [facility] at [facility] as a nipple ointment which has both an antifungal & antibacterial component to the cream.   2.  Oral Antifungal (such as Diflucan).    LC discussed with RN consult and strategies to continue using fresh milk as long as both mom and baby are being treated.    Consult Status  PRN    Kathy Zimmerman 06/17/2015, 3:17 PM

## 2015-06-18 ENCOUNTER — Ambulatory Visit: Payer: Self-pay

## 2015-06-18 NOTE — Lactation Note (Signed)
This note was copied from the chart of [facility]. Lactation Consultation Note  Patient Name: Kathy Zimmerman GSUPJ'S Date: 06/18/2015   Baby 2 months old, being treated for oral thrush. Mom's HCP, Dr. Florene Route, called to clarify if this LC believes maternal yeast "ductal." Discussed with HCP that mom's complaints of sharp breast pains, along with red, shiny, itching nipples and baby with confirmed oral yeast are consistent with maternal ductal yeast.   Maternal Data    Feeding Feeding Type: Breast Milk Nipple Type: Slow - flow Length of feed: 30 min  LATCH Score/Interventions                      Lactation Tools Discussed/Used     Consult Status      Lesli Albee, Christel Bai 06/18/2015, 11:58 AM

## 2015-06-21 ENCOUNTER — Ambulatory Visit: Payer: Self-pay

## 2015-06-21 NOTE — Lactation Note (Signed)
This note was copied from the chart of [place]. Lactation Consultation Note  Patient Name: Kathy Zimmerman AXENM'M Date: 06/21/2015 Reason for consult: Follow-up assessment;NICU baby  NICU baby 37 months old. [redacted]w[redacted]d CGA. Called to discuss mom nursing baby while her nipples are sore nipples and how to relieve tight areas in breast. Enc mom to rest her nipples--not breastfeed--until not as irritated and sore. Enc mom to use thin layer of coconut oil while pumping to reduce friction while pumping, and discussed antifungal properties of coconut oil as well. Mom states that she is using APNC topically too. Discussed reducing sugar in mom's diet. Enc mom to use moist heat, massage, and hand pump/hand expression to remove EBM that remains after pumping with DEBP.   Maternal Data    Feeding Feeding Type: Breast Milk Nipple Type: Slow - flow Length of feed: 30 min (15 min nipple and 15 min gavage)  LATCH Score/Interventions                      Lactation Tools Discussed/Used     Consult Status Consult Status: PRN    Inocente Salles 06/21/2015, 2:13 PM

## 2015-06-23 ENCOUNTER — Ambulatory Visit: Payer: Self-pay

## 2015-06-23 NOTE — Lactation Note (Signed)
This note was copied from the chart of [place]. Lactation Consultation Note  Patient Name: Kathy Zimmerman KQASU'O Date: 06/23/2015 Reason for consult: Follow-up assessment;NICU baby  NICU baby 2 months old, [redacted]w[redacted]d. Mom states that she has stopped using oral Diflucan/antibiotic after 5 days of use due to discomfort/abdominal pain, but mom is still using [place]. Mom's nipples still pink, but less than before. Assessed mom while pumping. Mom was using a #30 flange which was too large. Fitted mom with a #24, but enc using #27 if any discomfort. Mom states that she has been pumping for 30-60 minutes X6/24 hours. Enc mom to pump 15-20 minutes, massaging while pumping, and increase to 8 times a day. Mom states that warm, moist heat did alleviate tight/hard area she had earlier. Mom given a printout of Barnabas Lister Newman's Candida protocol.  Maternal Data    Feeding    LATCH Score/Interventions                      Lactation Tools Discussed/Used     Consult Status Consult Status: PRN    Inocente Salles 06/23/2015, 4:42 PM

## 2016-03-02 ENCOUNTER — Other Ambulatory Visit: Payer: Self-pay | Admitting: Obstetrics & Gynecology

## 2016-03-02 DIAGNOSIS — O3680X0 Pregnancy with inconclusive fetal viability, not applicable or unspecified: Secondary | ICD-10-CM

## 2016-03-03 ENCOUNTER — Ambulatory Visit (INDEPENDENT_AMBULATORY_CARE_PROVIDER_SITE_OTHER): Payer: BLUE CROSS/BLUE SHIELD

## 2016-03-03 DIAGNOSIS — Z3A08 8 weeks gestation of pregnancy: Secondary | ICD-10-CM

## 2016-03-03 DIAGNOSIS — O3680X0 Pregnancy with inconclusive fetal viability, not applicable or unspecified: Secondary | ICD-10-CM | POA: Diagnosis not present

## 2016-03-03 NOTE — Progress Notes (Signed)
U 7+6 wks,single IUP w/ys,pos fht 143 bpm,normal ov's bilat,crl 14.5 mm

## 2016-03-04 ENCOUNTER — Inpatient Hospital Stay (HOSPITAL_COMMUNITY)
Admission: AD | Admit: 2016-03-04 | Discharge: 2016-03-04 | Disposition: A | Discharge status: Home / Self Care | Payer: BLUE CROSS/BLUE SHIELD | Source: Ambulatory Visit | Attending: Obstetrics and Gynecology | Admitting: Obstetrics and Gynecology

## 2016-03-04 DIAGNOSIS — K589 Irritable bowel syndrome without diarrhea: Secondary | ICD-10-CM | POA: Diagnosis not present

## 2016-03-04 DIAGNOSIS — O210 Mild hyperemesis gravidarum: Secondary | ICD-10-CM | POA: Insufficient documentation

## 2016-03-04 DIAGNOSIS — J45909 Unspecified asthma, uncomplicated: Secondary | ICD-10-CM | POA: Diagnosis not present

## 2016-03-04 DIAGNOSIS — O99511 Diseases of the respiratory system complicating pregnancy, first trimester: Secondary | ICD-10-CM | POA: Diagnosis not present

## 2016-03-04 DIAGNOSIS — Z8249 Family history of ischemic heart disease and other diseases of the circulatory system: Secondary | ICD-10-CM | POA: Insufficient documentation

## 2016-03-04 DIAGNOSIS — Z3A08 8 weeks gestation of pregnancy: Secondary | ICD-10-CM | POA: Diagnosis not present

## 2016-03-04 DIAGNOSIS — O219 Vomiting of pregnancy, unspecified: Secondary | ICD-10-CM | POA: Diagnosis not present

## 2016-03-04 DIAGNOSIS — O99611 Diseases of the digestive system complicating pregnancy, first trimester: Secondary | ICD-10-CM | POA: Insufficient documentation

## 2016-03-04 LAB — URINALYSIS, ROUTINE W REFLEX MICROSCOPIC
Bilirubin Urine: NEGATIVE
Glucose, UA: NEGATIVE mg/dL
Hgb urine dipstick: NEGATIVE
Ketones, ur: NEGATIVE mg/dL
Nitrite: NEGATIVE
Protein, ur: NEGATIVE mg/dL
Specific Gravity, Urine: >1.03 — ABNORMAL HIGH (ref 1.005–1.030)
pH: 5.5 (ref 5.0–8.0)

## 2016-03-04 LAB — URINE MICROSCOPIC-ADD ON: RBC / HPF: NONE SEEN RBC/hpf (ref 0–5)

## 2016-03-04 MED ORDER — PROMETHAZINE HCL 25 MG PO TABS: 25.0000 mg | ORAL_TABLET | Freq: Four times a day (QID) | ORAL | Status: DC | PRN

## 2016-03-04 MED ORDER — PROMETHAZINE HCL 25 MG PO TABS
25.0000 mg | ORAL_TABLET | Freq: Once | ORAL | Status: AC
  Administered 2016-03-04: 25 mg via ORAL
  Filled 2016-03-04: qty 1

## 2016-03-04 NOTE — MAU Note (Signed)
Having a lot of n/v for 2 wks. Taking diclegis but not helping. Denies vag bleeding or d/c

## 2016-03-04 NOTE — MAU Note (Signed)
Dr Si Raider in to see pt. WIll give dose phenergan here and then give pt script. For phenergan

## 2016-03-04 NOTE — Discharge Instructions (Signed)

## 2016-03-04 NOTE — MAU Provider Note (Signed)
MAU HISTORY AND PHYSICAL  Chief Complaint:  Morning Sickness   Kathy Zimmerman is a 25 y.o.  G2P0101 with IUP at [redacted]w[redacted]d presenting for Morning Sickness  Trying diclegis, not working. Able to tolerate food and fluids. Called clinic and was told needed to be seen to be prescribed something stronger but appt not until next month. Had hyperemesis last pregnancy and doesn't want that to start. No abdominal pain, no diarrhea, no fevers.   Past Medical History  Diagnosis Date  . Asthma   . IBS (irritable bowel syndrome)   . Anxiety   . Panic attack   . Preeclampsia     Past Surgical History  Procedure Laterality Date  . Tendon repair    . Scleral buckle  08/09/2011    Procedure: SCLERAL BUCKLE;  Surgeon: Clent Demark Rankin;  Location: [facility];  Service: Ophthalmology;  Laterality: Left;  with cryo  . Cesarean section N/A 04/10/2015    Procedure: CESAREAN SECTION;  Surgeon: Crawford Givens, MD;  Location: [facility];  Service: Obstetrics;  Laterality: N/A;  . Eye surgery      laser    Family History  Problem Relation Age of Onset  . Thyroid disease Other   . CAD Other     Social History  Substance Use Topics  . Smoking status: Never Smoker   . Smokeless tobacco: Not on file  . Alcohol Use: No    Allergies  Allergen Reactions  . Sulfa Antibiotics Swelling    Prescriptions prior to admission  Medication Sig Dispense Refill Last Dose  . albuterol (PROVENTIL HFA;VENTOLIN HFA) 108 (90 BASE) MCG/ACT inhaler Inhale 1-2 puffs into the lungs every 6 (six) hours as needed for wheezing or shortness of breath.   Taking  . ALPRAZolam (XANAX) 0.5 MG tablet Take 1 tablet (0.5 mg total) by mouth 2 (two) times daily as needed for anxiety. 20 tablet 0 Taking  . CLONAZEPAM PO Take 1 tablet by mouth 3 (three) times daily.   Taking  . norethindrone-ethinyl estradiol (FEMHRT 1/5) 1-5 MG-MCG TABS Take 1 tablet by mouth daily.     . Prenatal Vit-Fe Fumarate-FA (PRENATAL MULTIVITAMIN) TABS tablet Take 1 tablet by  mouth daily at 12 noon.   Taking  . sertraline (ZOLOFT) 50 MG tablet Take 50 mg by mouth daily.   Taking    Review of Systems - Negative except for what is mentioned in HPI.  Physical Exam  Blood pressure 130/78, pulse 80, temperature 98.3 F (36.8 C), resp. rate 18, height 5\' 6"  (1.676 m), weight 159 lb (72.122 kg), not currently breastfeeding. GENERAL: Well-developed, well-nourished female in no acute distress.  LUNGS: Clear to auscultation bilaterally.  HEART: Regular rate and rhythm. ABDOMEN: Soft, nontender, nondistended,.  EXTREMITIES: Nontender, no edema, 2+ distal pulses.   Labs: Results for orders placed or performed during the hospital encounter of 03/04/16 (from the past 24 hour(s))  Urinalysis, Routine w reflex microscopic (not at [facility])   Collection Time: 03/04/16  8:40 PM  Result Value Ref Range   Color, Urine YELLOW YELLOW   APPearance HAZY (A) CLEAR   Specific Gravity, Urine >1.030 (H) 1.005 - 1.030   pH 5.5 5.0 - 8.0   Glucose, UA NEGATIVE NEGATIVE mg/dL   Hgb urine dipstick NEGATIVE NEGATIVE   Bilirubin Urine NEGATIVE NEGATIVE   Ketones, ur NEGATIVE NEGATIVE mg/dL   Protein, ur NEGATIVE NEGATIVE mg/dL   Nitrite NEGATIVE NEGATIVE   Leukocytes, UA TRACE (A) NEGATIVE  Urine microscopic-add on   Collection  Time: 03/04/16  8:40 PM  Result Value Ref Range   Squamous Epithelial / LPF 6-30 (A) NONE SEEN   WBC, UA 6-30 0 - 5 WBC/hpf   RBC / HPF NONE SEEN 0 - 5 RBC/hpf   Bacteria, UA FEW (A) NONE SEEN   Urine-Other MUCOUS PRESENT     Imaging Studies:  US Ob Comp Less 14 Wks  03/03/2016  DATING AND VIABILITY SONOGRAM Kathy Zimmerman is a 25 y.o. year old G83P0101 with unknown LMP.  She has irregular menstrual cycles.   She is here today for a confirmatory initial sonogram. GESTATION: SINGLETON   FETAL ACTIVITY:          Heart rate         143 CERVIX: Appears closed ADNEXA: The ovaries are normal. GESTATIONAL AGE AND  BIOMETRICS: Gestational criteria: Estimated Date of  Delivery: unknown Previous Scans:0    CROWN RUMP LENGTH           14.5 mm         7+6 weeks                                                                      AVERAGE EGA(BY THIS SCAN):  7+6 weeks WORKING EDD( early ultrasound ):  10/14/2016  TECHNICIAN COMMENTS: U 7+6 wks,single IUP w/ys,pos fht 143 bpm,normal ov's bilat,crl 14.5 mm A copy of this report including all images has been saved and backed up to a second source for retrieval if needed. All measures and details of the anatomical scan, placentation, fluid volume and pelvic anatomy are contained in that report. Silver Huguenin 03/03/2016 10:34 AM    Assessment: Kathy Zimmerman is  25 y.o. G2P0101 at [redacted]w[redacted]d presents with nausea/vomiting of pregnancy. History and exam not consistent w/ dehyration. sg is up in ua but no ketones..  Plan: - phenergan po - n/v return precautions - ob f/u next month, attempt to be seen earlier prn  Desma Maxim 6/24/201710:01 PM

## 2016-03-04 NOTE — MAU Note (Signed)
Pt had hyperemesis first preg and states just wants to stay ahead of n/v this time. Is able to keep down some flds and food. Will obtain u/a and pt will wait in lobby for results. Discussed with Daiva Nakayama CNM and will see pt in Triage once u/a results are back. Pt agrees with POC

## 2016-03-20 ENCOUNTER — Encounter: Payer: Self-pay | Admitting: Women's Health

## 2016-03-20 ENCOUNTER — Ambulatory Visit (INDEPENDENT_AMBULATORY_CARE_PROVIDER_SITE_OTHER): Payer: BLUE CROSS/BLUE SHIELD | Admitting: Women's Health

## 2016-03-20 VITALS — BP 110/68 | HR 84 | Wt 161.0 lb

## 2016-03-20 DIAGNOSIS — Z3A11 11 weeks gestation of pregnancy: Secondary | ICD-10-CM

## 2016-03-20 DIAGNOSIS — O09291 Supervision of pregnancy with other poor reproductive or obstetric history, first trimester: Secondary | ICD-10-CM

## 2016-03-20 DIAGNOSIS — O09299 Supervision of pregnancy with other poor reproductive or obstetric history, unspecified trimester: Secondary | ICD-10-CM | POA: Insufficient documentation

## 2016-03-20 DIAGNOSIS — Z331 Pregnant state, incidental: Secondary | ICD-10-CM

## 2016-03-20 DIAGNOSIS — Z3481 Encounter for supervision of other normal pregnancy, first trimester: Secondary | ICD-10-CM

## 2016-03-20 DIAGNOSIS — F419 Anxiety disorder, unspecified: Secondary | ICD-10-CM

## 2016-03-20 DIAGNOSIS — Z369 Encounter for antenatal screening, unspecified: Secondary | ICD-10-CM

## 2016-03-20 DIAGNOSIS — O26899 Other specified pregnancy related conditions, unspecified trimester: Secondary | ICD-10-CM | POA: Insufficient documentation

## 2016-03-20 DIAGNOSIS — Z3491 Encounter for supervision of normal pregnancy, unspecified, first trimester: Secondary | ICD-10-CM

## 2016-03-20 DIAGNOSIS — Z3490 Encounter for supervision of normal pregnancy, unspecified, unspecified trimester: Secondary | ICD-10-CM | POA: Insufficient documentation

## 2016-03-20 DIAGNOSIS — Z98891 History of uterine scar from previous surgery: Secondary | ICD-10-CM | POA: Insufficient documentation

## 2016-03-20 DIAGNOSIS — Z6791 Unspecified blood type, Rh negative: Secondary | ICD-10-CM | POA: Insufficient documentation

## 2016-03-20 DIAGNOSIS — Z1389 Encounter for screening for other disorder: Secondary | ICD-10-CM

## 2016-03-20 DIAGNOSIS — O360110 Maternal care for anti-D [Rh] antibodies, first trimester, not applicable or unspecified: Secondary | ICD-10-CM

## 2016-03-20 DIAGNOSIS — O09899 Supervision of other high risk pregnancies, unspecified trimester: Secondary | ICD-10-CM | POA: Insufficient documentation

## 2016-03-20 DIAGNOSIS — O34219 Maternal care for unspecified type scar from previous cesarean delivery: Secondary | ICD-10-CM

## 2016-03-20 DIAGNOSIS — Z0283 Encounter for blood-alcohol and blood-drug test: Secondary | ICD-10-CM

## 2016-03-20 LAB — POCT URINALYSIS DIPSTICK
Blood, UA: NEGATIVE
Glucose, UA: NEGATIVE
Ketones, UA: NEGATIVE
Leukocytes, UA: NEGATIVE
Nitrite, UA: NEGATIVE
Protein, UA: NEGATIVE

## 2016-03-20 NOTE — Progress Notes (Signed)
Subjective:  Kathy Zimmerman is a 25 y.o. G68P0101 Caucasian female at [redacted]w[redacted]d by 7wk u/s, being seen today for her first obstetrical visit.  Her obstetrical history is significant for prev LTCS @ 29.3wks d/t severe pre-, NRFHR, breech, BPP 4/8- states sx began at 27wks, baby doing well, no deficits- he is 31 months old. Got pregnant while taking Camilla/POPs, no longer breastfeeding, didn't know she had to take at exact same time daily- never missed a pill, but did take 1-2 hours late at times. Pregnancy history fully reviewed.  Anxiety- takes zoloft 50mg  daily, no longer sees counselor b/c they felt she was doing well. Does have h/o panic attacks, after last delivery was taking xanax qid and ativan.   Patient reports n/v- taking diclegis 4 total/day as directed and phenergan prn- helps some, had HG last pregnancy- definitely doesn't feel like it's that bad . Denies vb, cramping, uti s/s, abnormal/malodorous vag d/c, or vulvovaginal itching/irritation.  BP 110/68 mmHg  Pulse 84  Wt 161 lb (73.029 kg)  LMP  (LMP Unknown)  HISTORY: OB History  Gravida Para Term Preterm AB SAB TAB Ectopic Multiple Living  2 1  1      0 1    # Outcome Date GA Lbr Len/2nd Weight Sex Delivery Anes PTL Lv  2 Current           1 Preterm 04/10/15 [redacted]w[redacted]d  1 lb 14.7 oz (0.87 kg) M CS-LTranv Spinal N Y     Past Medical History  Diagnosis Date  . Asthma   . IBS (irritable bowel syndrome)   . Anxiety   . Panic attack   . Preeclampsia    Past Surgical History  Procedure Laterality Date  . Tendon repair    . Scleral buckle  08/09/2011    Procedure: SCLERAL BUCKLE;  Surgeon: Clent Demark Rankin;  Location: [facility];  Service: Ophthalmology;  Laterality: Left;  with cryo  . Cesarean section N/A 04/10/2015    Procedure: CESAREAN SECTION;  Surgeon: Crawford Givens, MD;  Location: [facility];  Service: Obstetrics;  Laterality: N/A;  . Eye surgery      laser   Family History  Problem Relation Age of Onset  . Thyroid disease Other    . CAD Other   . Depression Father   . Depression Sister   . Mental illness Sister   . Depression Maternal Grandmother   . Mental illness Maternal Grandmother   . Thyroid disease Maternal Grandmother   . Heart disease Maternal Grandfather   . Hypertension Maternal Grandfather   . Hyperlipidemia Maternal Grandfather   . CAD Maternal Grandfather     Exam   System:     General: Well developed & nourished, no acute distress   Skin: Warm & dry, normal coloration and turgor, no rashes   Neurologic: Alert & oriented, normal mood   Cardiovascular: Regular rate & rhythm   Respiratory: Effort & rate normal, LCTAB, acyanotic   Abdomen: Soft, non tender   Extremities: normal strength, tone  Thin prep pap smear neg 12/02/14   FHR: 165 via doppler   Assessment:   Pregnancy: G2P0101 Patient Active Problem List   Diagnosis Date Noted  . Supervision of normal pregnancy 03/20/2016    Priority: High  . Previous cesarean delivery, antepartum 03/20/2016    Priority: High  . Hx of severe preeclampsia, prior pregnancy, currently pregnant 03/20/2016    Priority: High  . Rh negative state in antepartum period 03/20/2016  . Short interval between  pregnancies affecting pregnancy, antepartum 03/20/2016  . Atypical chest pain 05/31/2015  . Anxiety 04/11/2015  . Retinal detachment of left eye with retinal break 08/08/2011    Class: Acute    [redacted]w[redacted]d G2P0101 New OB visit H/O severe pre-e @ 29wks H/O LTCS @ 29wks d/t NRFHR/breech/BPP 4/8 Anxiety Short interval pregnancy  Plan:  Initial labs drawn, including CMP and 24hr urine baseline Begin baby ASA @ 12wks d/t h/o severe pre-e @ 29wks Continue prenatal vitamins Problem list reviewed and updated Reviewed n/v relief measures and warning s/s to report Reviewed recommended weight gain based on pre-gravid BMI Encouraged well-balanced diet Genetic Screening discussed Integrated Screen and AFP: declined Cystic fibrosis screening discussed  declined Ultrasound discussed; fetal survey: requested Follow up in 4 weeks for visit [place] completed OK to continue zoloft 50mg  daily  Tawnya Crook CNM, [facility] 03/20/2016 3:39 PM

## 2016-03-20 NOTE — Patient Instructions (Addendum)
Begin taking a 81mg  baby aspirin daily at 12 weeks of pregnancy (July 22) to decrease risk of preeclampsia during pregnancy   Nausea & Vomiting  Have saltine crackers or pretzels by your bed and eat a few bites before you raise your head out of bed in the morning  Eat small frequent meals throughout the day instead of large meals  Drink plenty of fluids throughout the day to stay hydrated, just don't drink a lot of fluids with your meals.  This can make your stomach fill up faster making you feel sick  Do not brush your teeth right after you eat  Products with real ginger are good for nausea, like ginger ale and ginger hard candy Make sure it says made with real ginger!  Sucking on sour candy like lemon heads is also good for nausea  If your prenatal vitamins make you nauseated, take them at night so you will sleep through the nausea  Sea Bands  If you feel like you need medicine for the nausea & vomiting please let us know  If you are unable to keep any fluids or food down please let us know   Constipation  Drink plenty of fluid, preferably water, throughout the day  Eat foods high in fiber such as fruits, vegetables, and grains  Exercise, such as walking, is a good way to keep your bowels regular  Drink warm fluids, especially warm prune juice, or decaf coffee  Eat a 1/2 cup of real oatmeal (not instant), 1/2 cup applesauce, and 1/2-1 cup warm prune juice every day  If needed, you may take Colace (docusate sodium) stool softener once or twice a day to help keep the stool soft. If you are pregnant, wait until you are out of your first trimester (12-14 weeks of pregnancy)  If you still are having problems with constipation, you may take Miralax once daily as needed to help keep your bowels regular.  If you are pregnant, wait until you are out of your first trimester (12-14 weeks of pregnancy)   First Trimester of Pregnancy The first trimester of pregnancy is from week 1 until  the end of week 12 (months 1 through 3). A week after a sperm fertilizes an egg, the egg will implant on the wall of the uterus. This embryo will begin to develop into a baby. Genes from you and your partner are forming the baby. The female genes determine whether the baby is a boy or a girl. At 6-8 weeks, the eyes and face are formed, and the heartbeat can be seen on ultrasound. At the end of 12 weeks, all the baby's organs are formed.  Now that you are pregnant, you will want to do everything you can to have a healthy baby. Two of the most important things are to get good prenatal care and to follow your health care provider's instructions. Prenatal care is all the medical care you receive before the baby's birth. This care will help prevent, find, and treat any problems during the pregnancy and childbirth. BODY CHANGES Your body goes through many changes during pregnancy. The changes vary from woman to woman.   You may gain or lose a couple of pounds at first.  You may feel sick to your stomach (nauseous) and throw up (vomit). If the vomiting is uncontrollable, call your health care provider.  You may tire easily.  You may develop headaches that can be relieved by medicines approved by your health care provider.  You may  urinate more often. Painful urination may mean you have a bladder infection.  You may develop heartburn as a result of your pregnancy.  You may develop constipation because certain hormones are causing the muscles that push waste through your intestines to slow down.  You may develop hemorrhoids or swollen, bulging veins (varicose veins).  Your breasts may begin to grow larger and become tender. Your nipples may stick out more, and the tissue that surrounds them (areola) may become darker.  Your gums may bleed and may be sensitive to brushing and flossing.  Dark spots or blotches (chloasma, mask of pregnancy) may develop on your face. This will likely fade after the baby is  born.  Your menstrual periods will stop.  You may have a loss of appetite.  You may develop cravings for certain kinds of food.  You may have changes in your emotions from day to day, such as being excited to be pregnant or being concerned that something may go wrong with the pregnancy and baby.  You may have more vivid and strange dreams.  You may have changes in your hair. These can include thickening of your hair, rapid growth, and changes in texture. Some women also have hair loss during or after pregnancy, or hair that feels dry or thin. Your hair will most likely return to normal after your baby is born. WHAT TO EXPECT AT YOUR PRENATAL VISITS During a routine prenatal visit:  You will be weighed to make sure you and the baby are growing normally.  Your blood pressure will be taken.  Your abdomen will be measured to track your baby's growth.  The fetal heartbeat will be listened to starting around week 10 or 12 of your pregnancy.  Test results from any previous visits will be discussed. Your health care provider may ask you:  How you are feeling.  If you are feeling the baby move.  If you have had any abnormal symptoms, such as leaking fluid, bleeding, severe headaches, or abdominal cramping.  If you have any questions. Other tests that may be performed during your first trimester include:  Blood tests to find your blood type and to check for the presence of any previous infections. They will also be used to check for low iron levels (anemia) and Rh antibodies. Later in the pregnancy, blood tests for diabetes will be done along with other tests if problems develop.  Urine tests to check for infections, diabetes, or protein in the urine.  An ultrasound to confirm the proper growth and development of the baby.  An amniocentesis to check for possible genetic problems.  Fetal screens for spina bifida and Down syndrome.  You may need other tests to make sure you and the  baby are doing well. HOME CARE INSTRUCTIONS  Medicines  Follow your health care provider's instructions regarding medicine use. Specific medicines may be either safe or unsafe to take during pregnancy.  Take your prenatal vitamins as directed.  If you develop constipation, try taking a stool softener if your health care provider approves. Diet  Eat regular, well-balanced meals. Choose a variety of foods, such as meat or vegetable-based protein, fish, milk and low-fat dairy products, vegetables, fruits, and whole grain breads and cereals. Your health care provider will help you determine the amount of weight gain that is right for you.  Avoid raw meat and uncooked cheese. These carry germs that can cause birth defects in the baby.  Eating four or five small meals rather than  three large meals a day may help relieve nausea and vomiting. If you start to feel nauseous, eating a few soda crackers can be helpful. Drinking liquids between meals instead of during meals also seems to help nausea and vomiting.  If you develop constipation, eat more high-fiber foods, such as fresh vegetables or fruit and whole grains. Drink enough fluids to keep your urine clear or pale yellow. Activity and Exercise  Exercise only as directed by your health care provider. Exercising will help you:  Control your weight.  Stay in shape.  Be prepared for labor and delivery.  Experiencing pain or cramping in the lower abdomen or low back is a good sign that you should stop exercising. Check with your health care provider before continuing normal exercises.  Try to avoid standing for long periods of time. Move your legs often if you must stand in one place for a long time.  Avoid heavy lifting.  Wear low-heeled shoes, and practice good posture.  You may continue to have sex unless your health care provider directs you otherwise. Relief of Pain or Discomfort  Wear a good support bra for breast tenderness.    Take warm sitz baths to soothe any pain or discomfort caused by hemorrhoids. Use hemorrhoid cream if your health care provider approves.   Rest with your legs elevated if you have leg cramps or low back pain.  If you develop varicose veins in your legs, wear support hose. Elevate your feet for 15 minutes, 3-4 times a day. Limit salt in your diet. Prenatal Care  Schedule your prenatal visits by the twelfth week of pregnancy. They are usually scheduled monthly at first, then more often in the last 2 months before delivery.  Write down your questions. Take them to your prenatal visits.  Keep all your prenatal visits as directed by your health care provider. Safety  Wear your seat belt at all times when driving.  Make a list of emergency phone numbers, including numbers for family, friends, the hospital, and police and fire departments. General Tips  Ask your health care provider for a referral to a local prenatal education class. Begin classes no later than at the beginning of month 6 of your pregnancy.  Ask for help if you have counseling or nutritional needs during pregnancy. Your health care provider can offer advice or refer you to specialists for help with various needs.  Do not use hot tubs, steam rooms, or saunas.  Do not douche or use tampons or scented sanitary pads.  Do not cross your legs for long periods of time.  Avoid cat litter boxes and soil used by cats. These carry germs that can cause birth defects in the baby and possibly loss of the fetus by miscarriage or stillbirth.  Avoid all smoking, herbs, alcohol, and medicines not prescribed by your health care provider. Chemicals in these affect the formation and growth of the baby.  Schedule a dentist appointment. At home, brush your teeth with a soft toothbrush and be gentle when you floss. SEEK MEDICAL CARE IF:   You have dizziness.  You have mild pelvic cramps, pelvic pressure, or nagging pain in the abdominal  area.  You have persistent nausea, vomiting, or diarrhea.  You have a bad smelling vaginal discharge.  You have pain with urination.  You notice increased swelling in your face, hands, legs, or ankles. SEEK IMMEDIATE MEDICAL CARE IF:   You have a fever.  You are leaking fluid from your vagina.  You have spotting or bleeding from your vagina.  You have severe abdominal cramping or pain.  You have rapid weight gain or loss.  You vomit blood or material that looks like coffee grounds.  You are exposed to [place] measles and have never had them.  You are exposed to fifth disease or chickenpox.  You develop a severe headache.  You have shortness of breath.  You have any kind of trauma, such as from a fall or a car accident. Document Released: 08/22/2001 Document Revised: 01/12/2014 Document Reviewed: 07/08/2013 [facility] Patient Information 2015 [place], [place]. This information is not intended to replace advice given to you by your health care provider. Make sure you discuss any questions you have with your health care provider.

## 2016-03-21 LAB — PMP SCREEN PROFILE (10S), URINE
Amphetamine Screen, Ur: NEGATIVE ng/mL
Barbiturate Screen, Ur: NEGATIVE ng/mL
Benzodiazepine Screen, Urine: NEGATIVE ng/mL
Cannabinoids Ur Ql Scn: NEGATIVE ng/mL
Cocaine(Metab.)Screen, Urine: NEGATIVE ng/mL
Creatinine(Crt), U: 112.6 mg/dL (ref 20.0–300.0)
Methadone Scn, Ur: NEGATIVE ng/mL
Opiate Scrn, Ur: NEGATIVE ng/mL
Oxycodone+Oxymorphone Ur Ql Scn: NEGATIVE ng/mL
PCP Scrn, Ur: NEGATIVE ng/mL
Ph of Urine: 6.9 (ref 4.5–8.9)
Propoxyphene, Screen: NEGATIVE ng/mL

## 2016-03-21 LAB — URINALYSIS, ROUTINE W REFLEX MICROSCOPIC
Bilirubin, UA: NEGATIVE
Glucose, UA: NEGATIVE
Ketones, UA: NEGATIVE
Leukocytes, UA: NEGATIVE
Nitrite, UA: NEGATIVE
Protein, UA: NEGATIVE
RBC, UA: NEGATIVE
Specific Gravity, UA: 1.02 (ref 1.005–1.030)
Urobilinogen, Ur: 0.2 mg/dL (ref 0.2–1.0)
pH, UA: 7 (ref 5.0–7.5)

## 2016-03-21 LAB — COMPREHENSIVE METABOLIC PANEL
ALT: 14 IU/L (ref 0–32)
AST: 19 IU/L (ref 0–40)
Albumin/Globulin Ratio: 1.3 (ref 1.2–2.2)
Albumin: 4.3 g/dL (ref 3.5–5.5)
Alkaline Phosphatase: 64 IU/L (ref 39–117)
BUN/Creatinine Ratio: 14 (ref 9–23)
BUN: 7 mg/dL (ref 6–20)
Bilirubin Total: 0.2 mg/dL (ref 0.0–1.2)
CO2: 20 mmol/L (ref 18–29)
Calcium: 9.6 mg/dL (ref 8.7–10.2)
Chloride: 97 mmol/L (ref 96–106)
Creatinine, Ser: 0.5 mg/dL — ABNORMAL LOW (ref 0.57–1.00)
GFR calc Af Amer: 157 mL/min/{1.73_m2} (ref >59)
GFR calc non Af Amer: 136 mL/min/{1.73_m2} (ref >59)
Globulin, Total: 3.4 g/dL (ref 1.5–4.5)
Glucose: 81 mg/dL (ref 65–99)
Potassium: 4.5 mmol/L (ref 3.5–5.2)
Sodium: 138 mmol/L (ref 134–144)
Total Protein: 7.7 g/dL (ref 6.0–8.5)

## 2016-03-21 LAB — CBC
Hematocrit: 38.6 % (ref 34.0–46.6)
Hemoglobin: 13 g/dL (ref 11.1–15.9)
MCH: 29 pg (ref 26.6–33.0)
MCHC: 33.7 g/dL (ref 31.5–35.7)
MCV: 86 fL (ref 79–97)
Platelets: 273 10*3/uL (ref 150–379)
RBC: 4.49 x10E6/uL (ref 3.77–5.28)
RDW: 14.2 % (ref 12.3–15.4)
WBC: 10.5 10*3/uL (ref 3.4–10.8)

## 2016-03-21 LAB — RPR: RPR Ser Ql: NONREACTIVE

## 2016-03-21 LAB — HEPATITIS B SURFACE ANTIGEN: Hepatitis B Surface Ag: NEGATIVE

## 2016-03-21 LAB — RUBELLA SCREEN: Rubella Antibodies, IGG: 3.05 index (ref >0.99)

## 2016-03-21 LAB — ANTIBODY SCREEN: Antibody Screen: NEGATIVE

## 2016-03-21 LAB — HIV ANTIBODY (ROUTINE TESTING W REFLEX): HIV Screen 4th Generation wRfx: NONREACTIVE

## 2016-03-21 LAB — VARICELLA ZOSTER ANTIBODY, IGG: Varicella zoster IgG: 3814 {index} (ref >165)

## 2016-03-22 LAB — URINE CULTURE

## 2016-03-22 LAB — GC/CHLAMYDIA PROBE AMP
Chlamydia trachomatis, NAA: NEGATIVE
Neisseria gonorrhoeae by PCR: NEGATIVE

## 2016-03-23 LAB — PROTEIN, URINE, 24 HOUR
Protein, 24H Urine: 62 mg/24 hr (ref 30–150)
Protein, Ur: 12.4 mg/dL

## 2016-03-24 ENCOUNTER — Telehealth: Payer: Self-pay | Admitting: Advanced Practice Midwife

## 2016-03-24 MED ORDER — PROMETHAZINE HCL 25 MG RE SUPP: 25.0000 mg | Freq: Four times a day (QID) | RECTAL | Status: DC | PRN

## 2016-03-24 NOTE — Telephone Encounter (Signed)
Pt states that she has taken Diclegis and Phenergan but neither one seems to help. Can we give Zofran? Pt advised that I would ask a provider about giving her an Rx for Zofran. Pt verbalized understanding.

## 2016-03-24 NOTE — Telephone Encounter (Signed)
Pt aware that medication has been sent in to her pharmacy. Pt also aware to call our office if this does not help, also aware of the after hours nurse line.

## 2016-03-27 ENCOUNTER — Encounter (HOSPITAL_COMMUNITY): Payer: Self-pay | Admitting: *Deleted

## 2016-03-27 ENCOUNTER — Emergency Department (HOSPITAL_COMMUNITY)
Admission: EM | Admit: 2016-03-27 | Discharge: 2016-03-28 | Disposition: A | Discharge status: Home / Self Care | Payer: BLUE CROSS/BLUE SHIELD | Attending: Emergency Medicine | Admitting: Emergency Medicine

## 2016-03-27 DIAGNOSIS — Z79899 Other long term (current) drug therapy: Secondary | ICD-10-CM | POA: Insufficient documentation

## 2016-03-27 DIAGNOSIS — E860 Dehydration: Secondary | ICD-10-CM | POA: Diagnosis not present

## 2016-03-27 DIAGNOSIS — O219 Vomiting of pregnancy, unspecified: Secondary | ICD-10-CM | POA: Insufficient documentation

## 2016-03-27 DIAGNOSIS — O99281 Endocrine, nutritional and metabolic diseases complicating pregnancy, first trimester: Secondary | ICD-10-CM | POA: Insufficient documentation

## 2016-03-27 DIAGNOSIS — Z3A11 11 weeks gestation of pregnancy: Secondary | ICD-10-CM | POA: Insufficient documentation

## 2016-03-27 DIAGNOSIS — J45909 Unspecified asthma, uncomplicated: Secondary | ICD-10-CM | POA: Insufficient documentation

## 2016-03-27 DIAGNOSIS — R112 Nausea with vomiting, unspecified: Secondary | ICD-10-CM

## 2016-03-27 LAB — URINALYSIS, ROUTINE W REFLEX MICROSCOPIC
Glucose, UA: NEGATIVE mg/dL
Hgb urine dipstick: NEGATIVE
Ketones, ur: 15 mg/dL — AB
Leukocytes, UA: NEGATIVE
Nitrite: NEGATIVE
Protein, ur: 30 mg/dL — AB
Specific Gravity, Urine: >1.03 — ABNORMAL HIGH (ref 1.005–1.030)
pH: 6 (ref 5.0–8.0)

## 2016-03-27 LAB — COMPREHENSIVE METABOLIC PANEL
ALT: 16 U/L (ref 14–54)
AST: 19 U/L (ref 15–41)
Albumin: 4.1 g/dL (ref 3.5–5.0)
Alkaline Phosphatase: 57 U/L (ref 38–126)
Anion gap: 9 (ref 5–15)
BUN: 7 mg/dL (ref 6–20)
CO2: 23 mmol/L (ref 22–32)
Calcium: 9.2 mg/dL (ref 8.9–10.3)
Chloride: 98 mmol/L — ABNORMAL LOW (ref 101–111)
Creatinine, Ser: 0.56 mg/dL (ref 0.44–1.00)
GFR calc Af Amer: >60 mL/min (ref >60)
GFR calc non Af Amer: >60 mL/min (ref >60)
Glucose, Bld: 92 mg/dL (ref 65–99)
Potassium: 3.7 mmol/L (ref 3.5–5.1)
Sodium: 130 mmol/L — ABNORMAL LOW (ref 135–145)
Total Bilirubin: 0.4 mg/dL (ref 0.3–1.2)
Total Protein: 8.6 g/dL — ABNORMAL HIGH (ref 6.5–8.1)

## 2016-03-27 LAB — CBC
HCT: 38.6 % (ref 36.0–46.0)
Hemoglobin: 13.4 g/dL (ref 12.0–15.0)
MCH: 29.8 pg (ref 26.0–34.0)
MCHC: 34.7 g/dL (ref 30.0–36.0)
MCV: 85.8 fL (ref 78.0–100.0)
Platelets: 233 10*3/uL (ref 150–400)
RBC: 4.5 MIL/uL (ref 3.87–5.11)
RDW: 13.5 % (ref 11.5–15.5)
WBC: 14.8 10*3/uL — ABNORMAL HIGH (ref 4.0–10.5)

## 2016-03-27 LAB — HCG, QUANTITATIVE, PREGNANCY: hCG, Beta Chain, Quant, S: 86341 m[IU]/mL — ABNORMAL HIGH (ref <5)

## 2016-03-27 LAB — URINE MICROSCOPIC-ADD ON: RBC / HPF: NONE SEEN RBC/hpf (ref 0–5)

## 2016-03-27 LAB — LIPASE, BLOOD: Lipase: 15 U/L (ref 11–51)

## 2016-03-27 MED ORDER — SODIUM CHLORIDE 0.9 % IV SOLN: 1000.0000 mL | INTRAVENOUS | Status: DC

## 2016-03-27 MED ORDER — ONDANSETRON HCL 4 MG/2ML IJ SOLN
4.0000 mg | Freq: Once | INTRAMUSCULAR | Status: AC
  Administered 2016-03-27: 4 mg via INTRAVENOUS
  Filled 2016-03-27: qty 2

## 2016-03-27 MED ORDER — SODIUM CHLORIDE 0.9 % IV SOLN
1000.0000 mL | Freq: Once | INTRAVENOUS | Status: AC
  Administered 2016-03-27: 1000 mL via INTRAVENOUS

## 2016-03-27 MED ORDER — ACETAMINOPHEN 500 MG PO TABS
1000.0000 mg | ORAL_TABLET | Freq: Once | ORAL | Status: AC
  Administered 2016-03-28: 1000 mg via ORAL
  Filled 2016-03-27: qty 2

## 2016-03-27 NOTE — ED Notes (Signed)
Pt states she is pregnant and unable to keep anything down; the symptoms has gotten progressively worse over the last 3 days

## 2016-03-27 NOTE — ED Provider Notes (Signed)
CSN: [id number]     Arrival date & time 03/27/16  1935 History  By signing my name below, I, Kathy Zimmerman, attest that this documentation has been prepared under the direction and in the presence of Jola Schmidt, MD. Electronically Signed: Georgette Zimmerman, ED Scribe. 03/27/2016. 11:46 PM.   Chief Complaint  Patient presents with  . Emesis During Pregnancy   The history is provided by the patient. No language interpreter was used.   HPI Comments: Kathy Zimmerman is a 25 y.o. female who is [redacted] weeks pregnant presents to the Emergency Department complaining of gradually worsening, episodic vomiting onset three days ago. Pt also has a sore throat. She reports that she is unable to keep anything down and has not eaten in a day and a half. Pt notes she has abdominal pain secondary to vomiting. Pt has used Phenergan with no relief. Pt has a gynecologist she follows up with at [facility] and has an appointment beginning of August. Pt denies fever.  Past Medical History  Diagnosis Date  . Asthma   . IBS (irritable bowel syndrome)   . Anxiety   . Panic attack   . Preeclampsia    Past Surgical History  Procedure Laterality Date  . Tendon repair    . Scleral buckle  08/09/2011    Procedure: SCLERAL BUCKLE;  Surgeon: Clent Demark Rankin;  Location: [facility];  Service: Ophthalmology;  Laterality: Left;  with cryo  . Cesarean section N/A 04/10/2015    Procedure: CESAREAN SECTION;  Surgeon: Crawford Givens, MD;  Location: [facility];  Service: Obstetrics;  Laterality: N/A;  . Eye surgery      laser   Family History  Problem Relation Age of Onset  . Thyroid disease Other   . CAD Other   . Depression Father   . Depression Sister   . Mental illness Sister   . Depression Maternal Grandmother   . Mental illness Maternal Grandmother   . Thyroid disease Maternal Grandmother   . Heart disease Maternal Grandfather   . Hypertension Maternal Grandfather   . Hyperlipidemia Maternal Grandfather   . CAD Maternal  Grandfather    Social History  Substance Use Topics  . Smoking status: Never Smoker   . Smokeless tobacco: None  . Alcohol Use: No   OB History    Gravida Para Term Preterm AB TAB SAB Ectopic Multiple Living   2 1  1      0 1     Review of Systems A complete 10 system review of systems was obtained and all systems are negative except as noted in the HPI and PMH.   Allergies  Sulfa antibiotics  Home Medications   Prior to Admission medications   Medication Sig Start Date End Date Taking? Authorizing Provider  albuterol (PROVENTIL HFA;VENTOLIN HFA) 108 (90 BASE) MCG/ACT inhaler Inhale 1-2 puffs into the lungs every 6 (six) hours as needed for wheezing or shortness of breath. Reported on 03/20/2016   Yes Historical Provider, MD  Doxylamine-Pyridoxine (DICLEGIS) 10-10 MG TBEC Take 1 tablet by mouth 4 (four) times daily.    Yes Historical Provider, MD  Prenatal Vit-Fe Fumarate-FA (PRENATAL MULTIVITAMIN) TABS tablet Take 1 tablet by mouth daily at 12 noon.   Yes Historical Provider, MD  promethazine (PHENERGAN) 25 MG suppository Place 1 suppository (25 mg total) rectally every 6 (six) hours as needed for nausea or vomiting. 03/24/16  Yes Jonnie Kind, MD  promethazine (PHENERGAN) 25 MG tablet Take 1 tablet (25  mg total) by mouth every 6 (six) hours as needed for nausea or vomiting. 03/04/16  Yes Gwynne Edinger, MD  sertraline (ZOLOFT) 50 MG tablet Take 50 mg by mouth daily.    Yes Historical Provider, MD   BP 114/72 mmHg  Pulse 86  Temp(Src) 99.1 F (37.3 C) (Oral)  Resp 20  Ht 5\' 6"  (1.676 m)  Wt 155 lb (70.308 kg)  BMI 25.03 kg/m2  SpO2 100%  LMP  (LMP Unknown) Physical Exam  Constitutional: She is oriented to person, place, and time. She appears well-developed and well-nourished. No distress.  HENT:  Head: Normocephalic and atraumatic.  Tonsillar swelling, mild tonsillar exudate. Cervical lymphadenopathy on the left.   Eyes: EOM are normal.  Neck: Normal range of motion.   Cardiovascular: Normal rate, regular rhythm and normal heart sounds.   Pulmonary/Chest: Effort normal and breath sounds normal.  Abdominal: Soft. She exhibits no distension. There is no tenderness.  Musculoskeletal: Normal range of motion.  Neurological: She is alert and oriented to person, place, and time.  Skin: Skin is warm and dry.  Psychiatric: She has a normal mood and affect. Judgment normal.  Nursing note and vitals reviewed.   ED Course  Procedures  DIAGNOSTIC STUDIES: Oxygen Saturation is 100% on RA, normal by my interpretation.    COORDINATION OF CARE: 11:45 PM Discussed treatment plan with pt at bedside which includes IV fluids and Zofran and pt agreed to plan.  Labs Review Labs Reviewed  COMPREHENSIVE METABOLIC PANEL - Abnormal; Notable for the following:    Sodium 130 (*)    Chloride 98 (*)    Total Protein 8.6 (*)    All other components within normal limits  CBC - Abnormal; Notable for the following:    WBC 14.8 (*)    All other components within normal limits  URINALYSIS, ROUTINE W REFLEX MICROSCOPIC (NOT AT [facility]) - Abnormal; Notable for the following:    Specific Gravity, Urine >1.030 (*)    Bilirubin Urine SMALL (*)    Ketones, ur 15 (*)    Protein, ur 30 (*)    All other components within normal limits  HCG, QUANTITATIVE, PREGNANCY - Abnormal; Notable for the following:    hCG, Beta Chain, Quant, S 86341 (*)    All other components within normal limits  URINE MICROSCOPIC-ADD ON - Abnormal; Notable for the following:    Squamous Epithelial / LPF TOO NUMEROUS TO COUNT (*)    Bacteria, UA MANY (*)    All other components within normal limits  RAPID STREP SCREEN (NOT AT [facility])  CULTURE, GROUP A STREP ([place])  LIPASE, BLOOD    Imaging Review No results found. I have personally reviewed and evaluated these images and lab results as part of my medical decision-making.   EKG Interpretation None      MDM   Final diagnoses:  Nausea and vomiting,  vomiting of unspecified type  Dehydration    Patient is feeling much better this time.  She was hydrated.  She'll be discharged home with a very short course of Zofran.  I will ask her to follow-up with her OB/GYN to figure out what the long-term approach to her ongoing nausea and vomiting will be.  She had hyperemesis during her first pregnancy total week 21.  I personally performed the services described in this documentation, which was scribed in my presence. The recorded information has been reviewed and is accurate.      Jola Schmidt, MD 03/28/16 [phone]

## 2016-03-28 DIAGNOSIS — O219 Vomiting of pregnancy, unspecified: Secondary | ICD-10-CM | POA: Diagnosis not present

## 2016-03-28 LAB — RAPID STREP SCREEN (MED CTR MEBANE ONLY): Streptococcus, Group A Screen (Direct): NEGATIVE

## 2016-03-28 MED ORDER — ONDANSETRON 8 MG PO TBDP: 8.0000 mg | ORAL_TABLET | Freq: Three times a day (TID) | ORAL | Status: DC | PRN

## 2016-03-28 NOTE — Discharge Instructions (Signed)

## 2016-03-29 ENCOUNTER — Telehealth: Payer: Self-pay | Admitting: *Deleted

## 2016-03-29 NOTE — Telephone Encounter (Signed)
Pt had to go to ED for dehydration. Pt can't keep anything down. Unable to keep zofran or phenergan down. Pt advised that we would need to see her. Phone call switched to front office and an appointment was given.

## 2016-03-30 ENCOUNTER — Other Ambulatory Visit: Payer: Self-pay | Admitting: Obstetrics and Gynecology

## 2016-03-30 ENCOUNTER — Ambulatory Visit (INDEPENDENT_AMBULATORY_CARE_PROVIDER_SITE_OTHER): Payer: BLUE CROSS/BLUE SHIELD | Admitting: Obstetrics and Gynecology

## 2016-03-30 ENCOUNTER — Observation Stay (HOSPITAL_COMMUNITY)
Admission: AD | Admit: 2016-03-30 | Discharge: 2016-03-31 | Disposition: A | Discharge status: Home / Self Care | Payer: BLUE CROSS/BLUE SHIELD | Source: Ambulatory Visit | Attending: Obstetrics and Gynecology | Admitting: Obstetrics and Gynecology

## 2016-03-30 ENCOUNTER — Encounter (HOSPITAL_COMMUNITY): Payer: Self-pay

## 2016-03-30 VITALS — BP 98/72 | HR 84 | Wt 152.5 lb

## 2016-03-30 DIAGNOSIS — O09291 Supervision of pregnancy with other poor reproductive or obstetric history, first trimester: Secondary | ICD-10-CM

## 2016-03-30 DIAGNOSIS — J45909 Unspecified asthma, uncomplicated: Secondary | ICD-10-CM | POA: Diagnosis not present

## 2016-03-30 DIAGNOSIS — Z331 Pregnant state, incidental: Secondary | ICD-10-CM

## 2016-03-30 DIAGNOSIS — Z3491 Encounter for supervision of normal pregnancy, unspecified, first trimester: Secondary | ICD-10-CM

## 2016-03-30 DIAGNOSIS — O211 Hyperemesis gravidarum with metabolic disturbance: Secondary | ICD-10-CM | POA: Diagnosis not present

## 2016-03-30 DIAGNOSIS — Z3A11 11 weeks gestation of pregnancy: Secondary | ICD-10-CM | POA: Insufficient documentation

## 2016-03-30 DIAGNOSIS — Z1389 Encounter for screening for other disorder: Secondary | ICD-10-CM

## 2016-03-30 DIAGNOSIS — O34219 Maternal care for unspecified type scar from previous cesarean delivery: Secondary | ICD-10-CM

## 2016-03-30 LAB — POCT URINALYSIS DIPSTICK
Blood, UA: NEGATIVE
Glucose, UA: NEGATIVE
Leukocytes, UA: NEGATIVE
Nitrite, UA: NEGATIVE
Protein, UA: 1

## 2016-03-30 LAB — CULTURE, GROUP A STREP (THRC)

## 2016-03-30 LAB — COMPREHENSIVE METABOLIC PANEL
ALT: 13 U/L — ABNORMAL LOW (ref 14–54)
AST: 17 U/L (ref 15–41)
Albumin: 3.7 g/dL (ref 3.5–5.0)
Alkaline Phosphatase: 51 U/L (ref 38–126)
Anion gap: 9 (ref 5–15)
BUN: 8 mg/dL (ref 6–20)
CO2: 23 mmol/L (ref 22–32)
Calcium: 9.1 mg/dL (ref 8.9–10.3)
Chloride: 101 mmol/L (ref 101–111)
Creatinine, Ser: 0.58 mg/dL (ref 0.44–1.00)
GFR calc Af Amer: >60 mL/min (ref >60)
GFR calc non Af Amer: >60 mL/min (ref >60)
Glucose, Bld: 76 mg/dL (ref 65–99)
Potassium: 3.8 mmol/L (ref 3.5–5.1)
Sodium: 133 mmol/L — ABNORMAL LOW (ref 135–145)
Total Bilirubin: 0.6 mg/dL (ref 0.3–1.2)
Total Protein: 8 g/dL (ref 6.5–8.1)

## 2016-03-30 LAB — CBC
HCT: 36.6 % (ref 36.0–46.0)
Hemoglobin: 12.7 g/dL (ref 12.0–15.0)
MCH: 29.3 pg (ref 26.0–34.0)
MCHC: 34.7 g/dL (ref 30.0–36.0)
MCV: 84.5 fL (ref 78.0–100.0)
Platelets: 227 10*3/uL (ref 150–400)
RBC: 4.33 MIL/uL (ref 3.87–5.11)
RDW: 13 % (ref 11.5–15.5)
WBC: 9.2 10*3/uL (ref 4.0–10.5)

## 2016-03-30 MED ORDER — PROMETHAZINE HCL 12.5 MG PO TABS: 12.5000 mg | ORAL_TABLET | Freq: Four times a day (QID) | ORAL | Status: DC | PRN

## 2016-03-30 MED ORDER — METOCLOPRAMIDE HCL 5 MG/ML IJ SOLN
10.0000 mg | Freq: Four times a day (QID) | INTRAMUSCULAR | Status: DC
  Administered 2016-03-30 – 2016-03-31 (×2): 10 mg via INTRAVENOUS
  Filled 2016-03-30 (×2): qty 2

## 2016-03-30 MED ORDER — KCL IN DEXTROSE-NACL 20-5-0.45 MEQ/L-%-% IV SOLN
INTRAVENOUS | Status: DC
  Administered 2016-03-30 – 2016-03-31 (×2): via INTRAVENOUS

## 2016-03-30 MED ORDER — PROMETHAZINE HCL 25 MG RE SUPP: 25.0000 mg | Freq: Four times a day (QID) | RECTAL | Status: DC | PRN

## 2016-03-30 MED ORDER — SODIUM CHLORIDE 0.9 % IV SOLN
INTRAVENOUS | Status: AC
  Administered 2016-03-30: 16:00:00 via INTRAVENOUS

## 2016-03-30 MED ORDER — PRENATAL MULTIVITAMIN CH
1.0000 | ORAL_TABLET | Freq: Every day | ORAL | Status: DC
  Filled 2016-03-30 (×4): qty 1

## 2016-03-30 MED ORDER — PROMETHAZINE HCL 25 MG/ML IJ SOLN
12.5000 mg | Freq: Four times a day (QID) | INTRAMUSCULAR | Status: DC | PRN
  Administered 2016-03-30 – 2016-03-31 (×3): 12.5 mg via INTRAVENOUS
  Filled 2016-03-30 (×3): qty 1

## 2016-03-30 MED ORDER — GLYCOPYRROLATE 1 MG PO TABS
2.0000 mg | ORAL_TABLET | Freq: Three times a day (TID) | ORAL | Status: DC | PRN
  Filled 2016-03-30: qty 2

## 2016-03-30 NOTE — H&P (Signed)
Kathy Zimmerman is a 25 y.o.G21P0101  female presenting for admission overnight for rehydration due to hyper rhesus gravidarum with ketosis and 8-1/2 pound weight loss in 10 days this is a second pregnancy that she has experienced hyperemesis. She is more willing to take by mouth foods even though she may eventually May continue to vomit this pregnancy.. History OB History    Gravida Para Term Preterm AB TAB SAB Ectopic Multiple Living   2 1  1      0 1     Past Medical History  Diagnosis Date  . Asthma   . IBS (irritable bowel syndrome)   . Anxiety   . Panic attack   . Preeclampsia    Past Surgical History  Procedure Laterality Date  . Tendon repair    . Scleral buckle  08/09/2011    Procedure: SCLERAL BUCKLE;  Surgeon: Clent Demark Rankin;  Location: [facility];  Service: Ophthalmology;  Laterality: Left;  with cryo  . Cesarean section N/A 04/10/2015    Procedure: CESAREAN SECTION;  Surgeon: Crawford Givens, MD;  Location: [facility];  Service: Obstetrics;  Laterality: N/A;  . Eye surgery      laser   Family History: family history includes CAD in her maternal grandfather and other; Depression in her father, maternal grandmother, and sister; Heart disease in her maternal grandfather; Hyperlipidemia in her maternal grandfather; Hypertension in her maternal grandfather; Mental illness in her maternal grandmother and sister; Thyroid disease in her maternal grandmother and other. Social History:  reports that she has never smoked. She does not have any smokeless tobacco history on file. She reports that she does not drink alcohol or use illicit drugs.   Prenatal Transfer Tool  Maternal Diabetes:  ROS    not currently breastfeeding. Exam LMP  (LMP Unknown)  Physical Exam  Constitutional: She is oriented to person, place, and time. She appears well-developed and well-nourished.  HENT:  Head: Normocephalic.  Eyes: Pupils are equal, round, and reactive to light.  Neck: Neck supple.  Cardiovascular:  Normal rate.   Respiratory: Effort normal.  GI: Soft.  Neurological: She is alert and oriented to person, place, and time.  Psychiatric: She has a normal mood and affect. Her behavior is normal. Thought content normal.    Prenatal labs: ABO, Rh: --/--/A NEG (07/31 1010) Antibody: Negative (07/10 1557) Rubella: 3.05 (07/10 1557) RPR: Non Reactive (07/10 1557)  HBsAg: Negative (07/10 1557)  HIV: Non Reactive (07/10 1557)  GBS:     Assessment/Plan: Hyperemesis Gravidarum pregnancy 11 weeks 5 days weeks gestation mild dehydration with ketonuria Admit for rehydration at 500 cc per hour 2 L followed by D5 half-normal saline with 20 of K, we will give Reglan and Robinul, and Phenergan. Consideration for Medrol Dosepak will be given Demaree Liberto V 03/30/2016, 2:23 PM

## 2016-03-30 NOTE — Progress Notes (Signed)
Patient admitted to [facility] room 326.  Oriented to unit, room, staff, and call bell.  Belongings (cell phone) at bedside, patient brought no medications with to hospital, husband at bedside.  Patient is alert and oriented x 4.  Completed admission screening with patient and vitals obtained.  Assessed patient.  IV obtained and fluids initiated.  No acute distress, patient complains of mild nausea, no pain.  Will continue to monitor.

## 2016-03-30 NOTE — Progress Notes (Signed)
Unable to hear fetal heart tones.  Was able to see the number on the doppler but unable to hear.  Dr. Glo Herring notified.  Dr. Glo Herring stated that he will check for heart tones in the morning - no reason to be alarmed about the baby.  Doppler left in room for Dr. Glo Herring.  Patient's nurse notified.

## 2016-03-30 NOTE — Progress Notes (Signed)
.   High Risk Pregnancy HROB Diagnosis(es): Hyperemesis gravidarum    G2P0101 [redacted]w[redacted]d Estimated Date of Delivery: 10/14/16    HPI: The patient is being seen today for ongoing management of dehydration with 8 and 1/2 pound weight loss in last 10 days. Today she reports she is only voiding 1-2 times daily. She is willing to be admitted for rehydration with this and [facility]    BP weight and urine results reviewed and noted. Blood pressure 98/72, pulse 84, weight 152 lb 8 oz (69.174 kg), not currently breastfeeding.  F Urinalysis:NEGATIVE for glucose                 POSITIVE for protein and ketones  Fetal Surveillance Testing today:  None  Lab and sonogram results have been reviewed. Comments:   Assessment:  1.  Pregnancy at [redacted]w[redacted]d,  G2P0101   :  Hyperemesis gravidarum mild dehydration                        2.  We'll admit [facility] for rehydration Reglan and Robinul                        3. May need steroid taper for hyperemesis  Medication(s) Plans:  IV hydration antiemetics  Treatment Plan:  Admit  Follow up in 1 weeks for appointment for high risk OB care, hyperemesis recheck

## 2016-03-31 DIAGNOSIS — O211 Hyperemesis gravidarum with metabolic disturbance: Secondary | ICD-10-CM | POA: Diagnosis not present

## 2016-03-31 LAB — URINALYSIS, ROUTINE W REFLEX MICROSCOPIC
Bilirubin Urine: NEGATIVE
Glucose, UA: NEGATIVE mg/dL
Hgb urine dipstick: NEGATIVE
Ketones, ur: 40 mg/dL — AB
Leukocytes, UA: NEGATIVE
Nitrite: NEGATIVE
Protein, ur: NEGATIVE mg/dL
Specific Gravity, Urine: 1.025 (ref 1.005–1.030)
pH: 6 (ref 5.0–8.0)

## 2016-03-31 MED ORDER — METOCLOPRAMIDE HCL 10 MG PO TABS: 10.0000 mg | ORAL_TABLET | Freq: Three times a day (TID) | ORAL | Status: DC

## 2016-03-31 MED ORDER — GLYCOPYRROLATE 1 MG PO TABS: 1.0000 mg | ORAL_TABLET | Freq: Three times a day (TID) | ORAL | Status: DC | PRN

## 2016-03-31 NOTE — Progress Notes (Signed)
Pr l&o, vss. Continued intermittent n/v. Dc instructions and meds gone over with pt and sig other. Verbalizes understanding. Dc home

## 2016-03-31 NOTE — Discharge Instructions (Signed)
Eating Plan for Hyperemesis Gravidarum Severe cases of hyperemesis gravidarum can lead to dehydration and malnutrition. The hyperemesis eating plan is one way to lessen the symptoms of nausea and vomiting. It is often used with prescribed medicines to control your symptoms.  WHAT CAN I DO TO RELIEVE MY SYMPTOMS? Listen to your body. Everyone is different and has different preferences. Find what works best for you. Some of the following things may help:  Eat and drink slowly.  Eat 5-6 small meals daily instead of 3 large meals.   Eat crackers before you get out of bed in the morning.   Starchy foods are usually well tolerated (such as cereal, toast, bread, potatoes, pasta, rice, and pretzels).   Ginger may help with nausea. Add  tsp ground ginger to hot tea or choose ginger tea.   Try drinking 100% fruit juice or an electrolyte drink.  Continue to take your prenatal vitamins as directed by your health care provider. If you are having trouble taking your prenatal vitamins, talk with your health care provider about different options.  Include at least 1 serving of protein with your meals and snacks (such as meats or poultry, beans, nuts, eggs, or yogurt). Try eating a protein-rich snack before bed (such as cheese and crackers or a half [place] or peanut butter sandwich). WHAT THINGS SHOULD I AVOID TO REDUCE MY SYMPTOMS? The following things may help reduce your symptoms:  Avoid foods with strong smells. Try eating meals in well-ventilated areas that are free of odors.  Avoid drinking water or other beverages with meals. Try not to drink anything less than 30 minutes before and after meals.  Avoid drinking more than 1 cup of fluid at a time.  Avoid fried or high-fat foods, such as butter and cream sauces.  Avoid spicy foods.  Avoid skipping meals the best you can. Nausea can be more intense on an empty stomach. If you cannot tolerate food at that time, do not force it. Try sucking on  ice chips or other frozen items and make up the calories later.  Avoid lying down within 2 hours after eating.   This information is not intended to replace advice given to you by your health care provider. Make sure you discuss any questions you have with your health care provider.   Document Released: 06/25/2007 Document Revised: 09/02/2013 Document Reviewed: 07/02/2013 Elsevier Interactive Patient Education [employer].

## 2016-03-31 NOTE — Discharge Summary (Signed)
Physician Discharge Summary  Patient ID: Kathy Zimmerman MRN: [id number] DOB/AGE: Jan 13, 1991 25 y.o.  Admit date: 03/30/2016 Discharge date: 03/31/2016  Admission Diagnoses:Hyperemesis gravidarum before [redacted] weeks gestation, dehydration  Discharge Diagnoses: , Improved Active Problems:   Hyperemesis gravidarum before end of [redacted] week gestation, dehydration   Discharged Condition: good  Hospital Course: This 25 year old female G2P0101 , at 11 weeks 5 days was admitted for dehydration and 8 1/2 pound weight loss in 10 days due to hyperkinesis. The patient had ketones, and was only voiding once or twice daily. Her electrolytes were normal and the BUN and creatinine were not elevated. She was admitted received IV hydration and was begun on Robinul and Reglan. She almost immediately began to feel hungry and tolerated crackers toast and bland soft diet, began to void and after for voidings overnight and was considered stable for resumption of outpatient care within 24 hours of admission. She'll add Robinul and Reglan to her outpatient management, and is willing at this point she is willing to use Phenergan rectal suppositories even though she was previously reluctant to do so due to burning and discomfort.  Consults: None  Significant Diagnostic Studies: labs:  CBC    Component Value Date/Time   WBC 9.2 03/30/2016 1540   WBC 10.5 03/20/2016 1557   RBC 4.33 03/30/2016 1540   RBC 4.49 03/20/2016 1557   HGB 12.7 03/30/2016 1540   HCT 36.6 03/30/2016 1540   HCT 38.6 03/20/2016 1557   PLT 227 03/30/2016 1540   PLT 273 03/20/2016 1557   MCV 84.5 03/30/2016 1540   MCV 86 03/20/2016 1557   MCH 29.3 03/30/2016 1540   MCH 29.0 03/20/2016 1557   MCHC 34.7 03/30/2016 1540   MCHC 33.7 03/20/2016 1557   RDW 13.0 03/30/2016 1540   RDW 14.2 03/20/2016 1557   LYMPHSABS 2.8 05/22/2015 0200   MONOABS 0.9 05/22/2015 0200   EOSABS 0.3 05/22/2015 0200   BASOSABS 0.0 05/22/2015 0200      Treatments: IV  hydration, antiemetics Robinul Reglan  Discharge Exam: Blood pressure 110/67, pulse 74, temperature 98 F (36.7 C), temperature source Oral, resp. rate 16, height 5\' 6"  (1.676 m), weight 157 lb 8 oz (71.442 kg), SpO2 100 %, not currently breastfeeding. General appearance: alert, cooperative and no distress Head: Normocephalic, without obvious abnormality, atraumatic GI: soft, non-tender; bowel sounds normal; no masses,  no organomegaly   Disposition: 01-Home or Self Care     Medication List    ASK your doctor about these medications        albuterol 108 (90 Base) MCG/ACT inhaler  Commonly known as:  PROVENTIL HFA;VENTOLIN HFA  Inhale 1-2 puffs into the lungs every 6 (six) hours as needed for wheezing or shortness of breath. Reported on 03/20/2016     DICLEGIS 10-10 MG Tbec  Generic drug:  Doxylamine-Pyridoxine  Take 1 tablet by mouth 4 (four) times daily. Reported on 03/30/2016     ondansetron 8 MG disintegrating tablet  Commonly known as:  ZOFRAN ODT  Take 1 tablet (8 mg total) by mouth every 8 (eight) hours as needed for nausea or vomiting.     prenatal multivitamin Tabs tablet  Take 1 tablet by mouth daily at 12 noon. Reported on 03/30/2016     promethazine 25 MG tablet  Commonly known as:  PHENERGAN  Take 1 tablet (25 mg total) by mouth every 6 (six) hours as needed for nausea or vomiting.     promethazine 25 MG suppository  Commonly known  as:  PHENERGAN  Place 1 suppository (25 mg total) rectally every 6 (six) hours as needed for nausea or vomiting.     sertraline 50 MG tablet  Commonly known as:  ZOLOFT  Take 50 mg by mouth daily. Reported on 03/30/2016           Follow-up Information    Follow up with Jonnie Kind, MD In 1 week.   Specialties:  Obstetrics and Gynecology, Radiology   Why:  Continued OB care   Contact information:   [place] [address] [phone]       Signed: Jonnie Kind 03/31/2016, 8:44 AM

## 2016-04-04 ENCOUNTER — Encounter: Payer: Self-pay | Admitting: Obstetrics and Gynecology

## 2016-04-04 ENCOUNTER — Encounter: Payer: BLUE CROSS/BLUE SHIELD | Admitting: Obstetrics & Gynecology

## 2016-04-04 ENCOUNTER — Ambulatory Visit (INDEPENDENT_AMBULATORY_CARE_PROVIDER_SITE_OTHER): Payer: BLUE CROSS/BLUE SHIELD | Admitting: Obstetrics and Gynecology

## 2016-04-04 VITALS — BP 120/70 | HR 100 | Wt 153.5 lb

## 2016-04-04 DIAGNOSIS — Z1389 Encounter for screening for other disorder: Secondary | ICD-10-CM

## 2016-04-04 DIAGNOSIS — Z331 Pregnant state, incidental: Secondary | ICD-10-CM

## 2016-04-04 DIAGNOSIS — O34219 Maternal care for unspecified type scar from previous cesarean delivery: Secondary | ICD-10-CM

## 2016-04-04 DIAGNOSIS — Z3A13 13 weeks gestation of pregnancy: Secondary | ICD-10-CM

## 2016-04-04 DIAGNOSIS — O211 Hyperemesis gravidarum with metabolic disturbance: Secondary | ICD-10-CM

## 2016-04-04 DIAGNOSIS — Z3481 Encounter for supervision of other normal pregnancy, first trimester: Secondary | ICD-10-CM

## 2016-04-04 LAB — POCT URINALYSIS DIPSTICK
Blood, UA: NEGATIVE
Glucose, UA: NEGATIVE
Ketones, UA: NEGATIVE
Leukocytes, UA: NEGATIVE
Nitrite, UA: NEGATIVE
Protein, UA: NEGATIVE

## 2016-04-04 MED ORDER — ONDANSETRON 8 MG PO TBDP: 8.0000 mg | ORAL_TABLET | Freq: Three times a day (TID) | ORAL | 4 refills | Status: DC | PRN

## 2016-04-04 NOTE — Progress Notes (Signed)
Pt denies any problems or concerns at this time.  

## 2016-04-04 NOTE — Progress Notes (Signed)
[id number] [redacted]w[redacted]d Estimated Date of Delivery: 10/14/16 LROB  Patient reports  Keeping food down !!, using zofran, reglan, robinul Patient complaints:none. Needs refils.  Blood pressure 120/70, pulse 100, weight 69.6 kg (153 lb 8 oz), not currently breastfeeding.  refer to the ob flow sheet for FH and FHR, also BP, Wt, Urine results:notable for neg protein and ketones                          Physical Examination: General appearance - alert, well appearing, and in no distress and well hydrated                                      Abdomen                                                        -FHR 157                                                                                               Pelvic -                                             Questions were answered. Assessment: LROB G2P0101 @ [redacted]w[redacted]d  Resolved dehydration  Plan:  Continued routine obstetrical care,  Declines NT testing  F/u in 4  weeks for LROB

## 2016-04-17 ENCOUNTER — Encounter: Payer: BLUE CROSS/BLUE SHIELD | Admitting: Obstetrics & Gynecology

## 2016-04-17 ENCOUNTER — Telehealth: Payer: Self-pay | Admitting: Obstetrics & Gynecology

## 2016-04-19 NOTE — Telephone Encounter (Signed)
Unable to reach pt x3 attempts 

## 2016-04-24 ENCOUNTER — Other Ambulatory Visit: Payer: Self-pay | Admitting: Obstetrics and Gynecology

## 2016-04-25 ENCOUNTER — Telehealth: Payer: Self-pay | Admitting: Obstetrics & Gynecology

## 2016-04-25 NOTE — Telephone Encounter (Signed)
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  Manus Gunning

## 2016-04-25 NOTE — Telephone Encounter (Signed)
Letter completed and left at front desk for pt to pick up.

## 2016-05-02 ENCOUNTER — Ambulatory Visit (INDEPENDENT_AMBULATORY_CARE_PROVIDER_SITE_OTHER): Payer: BLUE CROSS/BLUE SHIELD | Admitting: Women's Health

## 2016-05-02 ENCOUNTER — Encounter: Payer: Self-pay | Admitting: Women's Health

## 2016-05-02 VITALS — BP 132/90 | HR 96 | Wt 159.0 lb

## 2016-05-02 DIAGNOSIS — Z3492 Encounter for supervision of normal pregnancy, unspecified, second trimester: Secondary | ICD-10-CM

## 2016-05-02 DIAGNOSIS — Z3A17 17 weeks gestation of pregnancy: Secondary | ICD-10-CM

## 2016-05-02 DIAGNOSIS — O99342 Other mental disorders complicating pregnancy, second trimester: Secondary | ICD-10-CM

## 2016-05-02 DIAGNOSIS — O09292 Supervision of pregnancy with other poor reproductive or obstetric history, second trimester: Secondary | ICD-10-CM

## 2016-05-02 DIAGNOSIS — Z331 Pregnant state, incidental: Secondary | ICD-10-CM

## 2016-05-02 DIAGNOSIS — Z3482 Encounter for supervision of other normal pregnancy, second trimester: Secondary | ICD-10-CM

## 2016-05-02 DIAGNOSIS — Z363 Encounter for antenatal screening for malformations: Secondary | ICD-10-CM

## 2016-05-02 DIAGNOSIS — Z1389 Encounter for screening for other disorder: Secondary | ICD-10-CM

## 2016-05-02 DIAGNOSIS — O34219 Maternal care for unspecified type scar from previous cesarean delivery: Secondary | ICD-10-CM

## 2016-05-02 LAB — POCT URINALYSIS DIPSTICK
Blood, UA: NEGATIVE
Glucose, UA: NEGATIVE
Leukocytes, UA: NEGATIVE
Nitrite, UA: NEGATIVE
Protein, UA: NEGATIVE

## 2016-05-02 MED ORDER — ONDANSETRON 4 MG PO TBDP: 4.0000 mg | ORAL_TABLET | Freq: Three times a day (TID) | ORAL | 4 refills | Status: DC | PRN

## 2016-05-02 MED ORDER — PANTOPRAZOLE SODIUM 20 MG PO TBEC: 20.0000 mg | DELAYED_RELEASE_TABLET | Freq: Every day | ORAL | 6 refills | Status: DC

## 2016-05-02 NOTE — Patient Instructions (Addendum)
Check your blood pressure four times a day, once in the morning and once at night, let us know if you   >160 on top or >110 on bottom  For Headaches:   Stay well hydrated, drink enough water so that your urine is clear, sometimes if you are dehydrated you can get headaches  Eat small frequent meals and snacks, sometimes if you are hungry you can get headaches  Sometimes you get headaches during pregnancy from the pregnancy hormones  You can try tylenol (1-2 regular strength 325mg  or 1-2 extra strength 500mg ) as directed on the box. The least amount of medication that works is best.   Cool compresses (cool wet washcloth or ice pack) to area of head that is hurting  You can also try drinking a caffeinated drink to see if this will help  If not helping, try below:  For Prevention of Headaches/Migraines:  CoQ10 100mg  three times daily  Vitamin B2 400mg  daily  Magnesium Oxide 400-600mg  daily  If You Get a Bad Headache/Migraine:  Benadryl 25mg    Magnesium Oxide  1 large Gatorade  2 extra strength Tylenol (1,000mg  total)  1 cup coffee or Coke  If this doesn't help please call us @ [phone]   Second Trimester of Pregnancy The second trimester is from week 13 through week 28, months 4 through 6. The second trimester is often a time when you feel your best. Your body has also adjusted to being pregnant, and you begin to feel better physically. Usually, morning sickness has lessened or quit completely, you may have more energy, and you may have an increase in appetite. The second trimester is also a time when the fetus is growing rapidly. At the end of the sixth month, the fetus is about 9 inches long and weighs about 1 pounds. You will likely begin to feel the baby move (quickening) between 18 and 20 weeks of the pregnancy. BODY CHANGES Your body goes through many changes during pregnancy. The changes vary from woman to woman.   Your weight will continue to increase. You  will notice your lower abdomen bulging out.  You may begin to get stretch marks on your hips, abdomen, and breasts.  You may develop headaches that can be relieved by medicines approved by your health care provider.  You may urinate more often because the fetus is pressing on your bladder.  You may develop or continue to have heartburn as a result of your pregnancy.  You may develop constipation because certain hormones are causing the muscles that push waste through your intestines to slow down.  You may develop hemorrhoids or swollen, bulging veins (varicose veins).  You may have back pain because of the weight gain and pregnancy hormones relaxing your joints between the bones in your pelvis and as a result of a shift in weight and the muscles that support your balance.  Your breasts will continue to grow and be tender.  Your gums may bleed and may be sensitive to brushing and flossing.  Dark spots or blotches (chloasma, mask of pregnancy) may develop on your face. This will likely fade after the baby is born.  A dark line from your belly button to the pubic area (linea nigra) may appear. This will likely fade after the baby is born.  You may have changes in your hair. These can include thickening of your hair, rapid growth, and changes in texture. Some women also have hair loss during or after pregnancy, or hair that feels  dry or thin. Your hair will most likely return to normal after your baby is born. WHAT TO EXPECT AT YOUR PRENATAL VISITS During a routine prenatal visit:  You will be weighed to make sure you and the fetus are growing normally.  Your blood pressure will be taken.  Your abdomen will be measured to track your baby's growth.  The fetal heartbeat will be listened to.  Any test results from the previous visit will be discussed. Your health care provider may ask you:  How you are feeling.  If you are feeling the baby move.  If you have had any abnormal  symptoms, such as leaking fluid, bleeding, severe headaches, or abdominal cramping.  If you are using any tobacco products, including cigarettes, chewing tobacco, and electronic cigarettes.  If you have any questions. Other tests that may be performed during your second trimester include:  Blood tests that check for:  Low iron levels (anemia).  Gestational diabetes (between 24 and 28 weeks).  Rh antibodies.  Urine tests to check for infections, diabetes, or protein in the urine.  An ultrasound to confirm the proper growth and development of the baby.  An amniocentesis to check for possible genetic problems.  Fetal screens for spina bifida and Down syndrome.  HIV (human immunodeficiency virus) testing. Routine prenatal testing includes screening for HIV, unless you choose not to have this test. HOME CARE INSTRUCTIONS   Avoid all smoking, herbs, alcohol, and unprescribed drugs. These chemicals affect the formation and growth of the baby.  Do not use any tobacco products, including cigarettes, chewing tobacco, and electronic cigarettes. If you need help quitting, ask your health care provider. You may receive counseling support and other resources to help you quit.  Follow your health care provider's instructions regarding medicine use. There are medicines that are either safe or unsafe to take during pregnancy.  Exercise only as directed by your health care provider. Experiencing uterine cramps is a good sign to stop exercising.  Continue to eat regular, healthy meals.  Wear a good support bra for breast tenderness.  Do not use hot tubs, steam rooms, or saunas.  Wear your seat belt at all times when driving.  Avoid raw meat, uncooked cheese, cat litter boxes, and soil used by cats. These carry germs that can cause birth defects in the baby.  Take your prenatal vitamins.  Take 1500-2000 mg of calcium daily starting at the 20th week of pregnancy until you deliver your  baby.  Try taking a stool softener (if your health care provider approves) if you develop constipation. Eat more high-fiber foods, such as fresh vegetables or fruit and whole grains. Drink plenty of fluids to keep your urine clear or pale yellow.  Take warm sitz baths to soothe any pain or discomfort caused by hemorrhoids. Use hemorrhoid cream if your health care provider approves.  If you develop varicose veins, wear support hose. Elevate your feet for 15 minutes, 3-4 times a day. Limit salt in your diet.  Avoid heavy lifting, wear low heel shoes, and practice good posture.  Rest with your legs elevated if you have leg cramps or low back pain.  Visit your dentist if you have not gone yet during your pregnancy. Use a soft toothbrush to brush your teeth and be gentle when you floss.  A sexual relationship may be continued unless your health care provider directs you otherwise.  Continue to go to all your prenatal visits as directed by your health care provider. SEEK  MEDICAL CARE IF:   You have dizziness.  You have mild pelvic cramps, pelvic pressure, or nagging pain in the abdominal area.  You have persistent nausea, vomiting, or diarrhea.  You have a bad smelling vaginal discharge.  You have pain with urination. SEEK IMMEDIATE MEDICAL CARE IF:   You have a fever.  You are leaking fluid from your vagina.  You have spotting or bleeding from your vagina.  You have severe abdominal cramping or pain.  You have rapid weight gain or loss.  You have shortness of breath with chest pain.  You notice sudden or extreme swelling of your face, hands, ankles, feet, or legs.  You have not felt your baby move in over an hour.  You have severe headaches that do not go away with medicine.  You have vision changes.   This information is not intended to replace advice given to you by your health care provider. Make sure you discuss any questions you have with your health care provider.    Document Released: 08/22/2001 Document Revised: 09/18/2014 Document Reviewed: 10/29/2012 Elsevier Interactive Patient Education [employer].

## 2016-05-02 NOTE — Progress Notes (Addendum)
Low-risk OB appointment G3P0101 [redacted]w[redacted]d Estimated Date of Delivery: 10/14/16 BP 132/90   Pulse 96   Wt 159 lb (72.1 kg)   LMP  (LMP Unknown)   BMI 25.66 kg/m   BP, weight, and urine reviewed.  Refer to obstetrical flow sheet for FH & FHR.  Pregnancy episode recently resolved/deactivated accidentally- possibly on d/c from hospital w/ hyperemesis.  Reports good fm.  Denies regular uc's, lof, vb, or uti s/s. Had heachace yesterday- checked bp yesterday at home and was 130s/90s. H/O severe pre-e w/ c/s @ 29wks. H/O anxiety- was on zoloft 100mg  daily- stopped taking early in pregnancy b/c she felt like it was too much. Has been more anxious lately. Hasn't been to counseling in awhile- to call and get appt scheduled. Restart zoloft- knows can take 3-4wks to notice improvement. +floaters yesterday, denies ruq/epigastric pain. Discussed too early for pre-e, but will monitor bp's. Continue to take baby asa daily as directed. Still vomiting a lot in am, has rx for zofran and phenergan which helps, needs refill on zofran- refill sent. Will add protonix- rx sent.  Wants RLTCS instead of TOLAC Reviewed warning s/s to report. Discussed bp/hx w/ [facility]- to take bp's qid at home and bring log to next appt in 3wks. If vomiting becomes excessive, unable to keep meds/food/fluids down- to go [facility]/iv meds.  Plan:  Continue routine obstetrical care  F/U in 3wks for OB appointment and anatomy u/s Declines genetic screening

## 2016-05-05 ENCOUNTER — Encounter (HOSPITAL_COMMUNITY): Payer: Self-pay | Admitting: *Deleted

## 2016-05-05 ENCOUNTER — Inpatient Hospital Stay (HOSPITAL_COMMUNITY)
Admission: AD | Admit: 2016-05-05 | Discharge: 2016-05-05 | Disposition: A | Discharge status: Home / Self Care | Payer: BLUE CROSS/BLUE SHIELD | Source: Ambulatory Visit | Attending: Obstetrics and Gynecology | Admitting: Obstetrics and Gynecology

## 2016-05-05 DIAGNOSIS — K589 Irritable bowel syndrome without diarrhea: Secondary | ICD-10-CM | POA: Diagnosis not present

## 2016-05-05 DIAGNOSIS — J45909 Unspecified asthma, uncomplicated: Secondary | ICD-10-CM | POA: Diagnosis not present

## 2016-05-05 DIAGNOSIS — Z7982 Long term (current) use of aspirin: Secondary | ICD-10-CM | POA: Insufficient documentation

## 2016-05-05 DIAGNOSIS — Z9889 Other specified postprocedural states: Secondary | ICD-10-CM | POA: Insufficient documentation

## 2016-05-05 DIAGNOSIS — O219 Vomiting of pregnancy, unspecified: Secondary | ICD-10-CM | POA: Diagnosis not present

## 2016-05-05 DIAGNOSIS — F419 Anxiety disorder, unspecified: Secondary | ICD-10-CM | POA: Insufficient documentation

## 2016-05-05 DIAGNOSIS — R51 Headache: Secondary | ICD-10-CM | POA: Insufficient documentation

## 2016-05-05 DIAGNOSIS — G44219 Episodic tension-type headache, not intractable: Secondary | ICD-10-CM

## 2016-05-05 DIAGNOSIS — Z79899 Other long term (current) drug therapy: Secondary | ICD-10-CM | POA: Diagnosis not present

## 2016-05-05 DIAGNOSIS — R1110 Vomiting, unspecified: Secondary | ICD-10-CM | POA: Insufficient documentation

## 2016-05-05 DIAGNOSIS — Z888 Allergy status to other drugs, medicaments and biological substances status: Secondary | ICD-10-CM | POA: Insufficient documentation

## 2016-05-05 LAB — URINALYSIS, ROUTINE W REFLEX MICROSCOPIC
Glucose, UA: NEGATIVE mg/dL
Hgb urine dipstick: NEGATIVE
Ketones, ur: >80 mg/dL — AB
Leukocytes, UA: NEGATIVE
Nitrite: NEGATIVE
Protein, ur: NEGATIVE mg/dL
Specific Gravity, Urine: >1.03 — ABNORMAL HIGH (ref 1.005–1.030)
pH: 6 (ref 5.0–8.0)

## 2016-05-05 MED ORDER — DIPHENHYDRAMINE HCL 50 MG/ML IJ SOLN
25.0000 mg | Freq: Once | INTRAMUSCULAR | Status: AC
  Administered 2016-05-05: 25 mg via INTRAVENOUS
  Filled 2016-05-05: qty 1

## 2016-05-05 MED ORDER — DEXTROSE IN LACTATED RINGERS 5 % IV SOLN
25.0000 mg | Freq: Once | INTRAVENOUS | Status: AC
  Administered 2016-05-05: 25 mg via INTRAVENOUS
  Filled 2016-05-05: qty 1

## 2016-05-05 MED ORDER — DEXAMETHASONE SODIUM PHOSPHATE 10 MG/ML IJ SOLN
10.0000 mg | Freq: Once | INTRAMUSCULAR | Status: AC
  Administered 2016-05-05: 10 mg via INTRAVENOUS
  Filled 2016-05-05: qty 1

## 2016-05-05 MED ORDER — SODIUM CHLORIDE 0.9 % IV SOLN
Freq: Once | INTRAVENOUS | Status: AC
  Administered 2016-05-05: 09:00:00 via INTRAVENOUS

## 2016-05-05 MED ORDER — DEXTROSE 5 % IN LACTATED RINGERS IV BOLUS
500.0000 mL | Freq: Once | INTRAVENOUS | Status: AC
  Administered 2016-05-05: 1000 mL via INTRAVENOUS

## 2016-05-05 NOTE — Discharge Instructions (Signed)
General Headache Without Cause A headache is pain or discomfort felt around the head or neck area. There are many causes and types of headaches. In some cases, the cause may not be found.  HOME CARE  Managing Pain  Take over-the-counter and prescription medicines only as told by your doctor.  Lie down in a dark, quiet room when you have a headache.  If directed, apply ice to the head and neck area:  Put ice in a plastic bag.  Place a towel between your skin and the bag.  Leave the ice on for 20 minutes, 2-3 times per day.  Use a heating pad or hot shower to apply heat to the head and neck area as told by your doctor.  Keep lights dim if bright lights bother you or make your headaches worse. Eating and Drinking  Eat meals on a regular schedule.  Lessen how much alcohol you drink.  Lessen how much caffeine you drink, or stop drinking caffeine. General Instructions  Keep all follow-up visits as told by your doctor. This is important.  Keep a journal to find out if certain things bring on headaches. For example, write down:  What you eat and drink.  How much sleep you get.  Any change to your diet or medicines.  Relax by getting a massage or doing other relaxing activities.  Lessen stress.  Sit up straight. Do not tighten (tense) your muscles.  Do not use tobacco products. This includes cigarettes, chewing tobacco, or e-cigarettes. If you need help quitting, ask your doctor.  Exercise regularly as told by your doctor.  Get enough sleep. This often means 7-9 hours of sleep. GET HELP IF:  Your symptoms are not helped by medicine.  You have a headache that feels different than the other headaches.  You feel sick to your stomach (nauseous) or you throw up (vomit).  You have a fever. GET HELP RIGHT AWAY IF:   Your headache becomes really bad.  You keep throwing up.  You have a stiff neck.  You have trouble seeing.  You have trouble speaking.  You have  pain in the eye or ear.  Your muscles are weak or you lose muscle control.  You lose your balance or have trouble walking.  You feel like you will pass out (faint) or you pass out.  You have confusion.   This information is not intended to replace advice given to you by your health care provider. Make sure you discuss any questions you have with your health care provider.   Document Released: 06/06/2008 Document Revised: 05/19/2015 Document Reviewed: 12/21/2014 Elsevier Interactive Patient Education 2016 Else[employer] Inc[employer]  Hyperemesis Gravidarum Hyperemesis gravidarum is a severe form of nausea and vomiting that happens during pregnancy. Hyperemesis is worse than morning sickness. It may cause you to have nausea or vomiting all day for many days. It may keep you from eating and drinking enough food and liquids. Hyperemesis usually occurs during the first half (the first 20 weeks) of pregnancy. It often goes away once a woman is in her second half of pregnancy. However, sometimes hyperemesis continues through an entire pregnancy.  CAUSES  The cause of this condition is not completely known but is thought to be related to changes in the body's hormones when pregnant. It could be from the high level of the pregnancy hormone or an increase in estrogen in the body.  SIGNS AND SYMPTOMS   Severe nausea and vomiting.  Nausea that does not go away.  Vomiting that does not allow you to keep any food down.  Weight loss and body fluid loss (dehydration).  Having no desire to eat or not liking food you have previously enjoyed. DIAGNOSIS  Your health care provider will do a physical exam and ask you about your symptoms. He or she may also order blood tests and urine tests to make sure something else is not causing the problem.  TREATMENT  You may only need medicine to control the problem. If medicines do not control the nausea and vomiting, you will be treated in the hospital to prevent dehydration,  increased acid in the blood (acidosis), weight loss, and changes in the electrolytes in your body that may harm the unborn baby (fetus). You may need IV fluids.  HOME CARE INSTRUCTIONS   Only take over-the-counter or prescription medicines as directed by your health care provider.  Try eating a couple of dry crackers or toast in the morning before getting out of bed.  Avoid foods and smells that upset your stomach.  Avoid fatty and spicy foods.  Eat 5-6 small meals a day.  Do not drink when eating meals. Drink between meals.  For snacks, eat high-protein foods, such as cheese.  Eat or suck on things that have ginger in them. Ginger helps nausea.  Avoid food preparation. The smell of food can spoil your appetite.  Avoid iron pills and iron in your multivitamins until after 3-4 months of being pregnant. However, consult with your health care provider before stopping any prescribed iron pills. SEEK MEDICAL CARE IF:   Your abdominal pain increases.  You have a severe headache.  You have vision problems.  You are losing weight. SEEK IMMEDIATE MEDICAL CARE IF:   You are unable to keep fluids down.  You vomit blood.  You have constant nausea and vomiting.  You have excessive weakness.  You have extreme thirst.  You have dizziness or fainting.  You have a fever or persistent symptoms for more than 2-3 days.  You have a fever and your symptoms suddenly get worse. MAKE SURE YOU:   Understand these instructions.  Will watch your condition.  Will get help right away if you are not doing well or get worse.   This information is not intended to replace advice given to you by your health care provider. Make sure you discuss any questions you have with your health care provider.   Document Released: 08/28/2005 Document Revised: 06/18/2013 Document Reviewed: 04/09/2013 Elsevier Interactive Patient Education [employer].

## 2016-05-05 NOTE — MAU Note (Signed)
Patient still unable to void notified Hansel Feinstein CNM, order received for additional bag of IVF.

## 2016-05-05 NOTE — MAU Note (Signed)
Pt reports headache x 4 days, body aches x 3 days.

## 2016-05-05 NOTE — MAU Provider Note (Signed)
History     CSN: [id number]  Arrival date and time: 05/05/16 [id number]   First Provider Initiated Contact with Patient 05/05/16 0730      Chief Complaint  Patient presents with  . Emesis   Emesis   This is a new problem. The current episode started 1 to 4 weeks ago. The problem occurs more than 10 times per day. The problem has been unchanged. The emesis has an appearance of stomach contents. There has been no fever. Associated symptoms include diarrhea, headaches and myalgias. Pertinent negatives include no abdominal pain, chills or fever. Risk factors: pregnancy  Treatments tried: phenergan and zofran  The treatment provided no relief.  Headache   This is a new problem. The current episode started in the past 7 days. The problem occurs constantly. The problem has been unchanged. The pain is located in the frontal region. The pain does not radiate. The pain quality is similar to prior headaches. The quality of the pain is described as squeezing. The pain is at a severity of 5/10. Associated symptoms include nausea and vomiting. Pertinent negatives include no abdominal pain or fever. Nothing aggravates the symptoms. She has tried acetaminophen for the symptoms. The treatment provided no relief.    Past Medical History:  Diagnosis Date  . Anxiety   . Asthma   . IBS (irritable bowel syndrome)   . Panic attack   . Preeclampsia     Past Surgical History:  Procedure Laterality Date  . CESAREAN SECTION N/A 04/10/2015   Procedure: CESAREAN SECTION;  Surgeon: Crawford Givens, MD;  Location: [facility];  Service: Obstetrics;  Laterality: N/A;  . EYE SURGERY     laser  . SCLERAL BUCKLE  08/09/2011   Procedure: SCLERAL BUCKLE;  Surgeon: Clent Demark Rankin;  Location: [facility];  Service: Ophthalmology;  Laterality: Left;  with cryo  . TENDON REPAIR      Family History  Problem Relation Age of Onset  . Depression Father   . Depression Sister   . Mental illness Sister   . Depression Maternal Grandmother    . Mental illness Maternal Grandmother   . Thyroid disease Maternal Grandmother   . Heart disease Maternal Grandfather   . Hypertension Maternal Grandfather   . Hyperlipidemia Maternal Grandfather   . CAD Maternal Grandfather   . Thyroid disease Other   . CAD Other     Social History  Substance Use Topics  . Smoking status: Never Smoker  . Smokeless tobacco: Never Used  . Alcohol use No    Allergies:  Allergies  Allergen Reactions  . Sulfa Antibiotics Swelling    Prescriptions Prior to Admission  Medication Sig Dispense Refill Last Dose  . aspirin EC 81 MG tablet Take 81 mg by mouth daily.   05/04/2016 at Unknown time  . ondansetron (ZOFRAN-ODT) 4 MG disintegrating tablet Take 1 tablet (4 mg total) by mouth every 8 (eight) hours as needed for nausea or vomiting. 30 tablet 4 05/04/2016 at Unknown time  . promethazine (PHENERGAN) 25 MG suppository Place 1 suppository (25 mg total) rectally every 6 (six) hours as needed for nausea or vomiting. 12 each 3 05/04/2016 at Unknown time  . albuterol (PROVENTIL HFA;VENTOLIN HFA) 108 (90 BASE) MCG/ACT inhaler Inhale 1-2 puffs into the lungs every 6 (six) hours as needed for wheezing or shortness of breath. Reported on 03/20/2016   Taking  . Doxylamine-Pyridoxine (DICLEGIS) 10-10 MG TBEC Take 1 tablet by mouth 4 (four) times daily. Reported on 03/30/2016  Unknown at Unknown time  . glycopyrrolate (ROBINUL) 1 MG tablet Take 1 tablet (1 mg total) by mouth 3 (three) times daily as needed (spitting). (Patient not taking: Reported on 05/02/2016) 60 tablet 2 Not Taking  . metoCLOPramide (REGLAN) 10 MG tablet Take 1 tablet (10 mg total) by mouth 3 (three) times daily before meals. (Patient not taking: Reported on 05/02/2016) 60 tablet 2 Not Taking  . pantoprazole (PROTONIX) 20 MG tablet Take 1 tablet (20 mg total) by mouth daily. 30 tablet 6   . Prenatal Vit-Fe Fumarate-FA (PRENATAL MULTIVITAMIN) TABS tablet Take 1 tablet by mouth daily at 12 noon. Reported  on 03/30/2016   Unknown at Unknown time  . promethazine (PHENERGAN) 25 MG tablet TAKE 1 TABLET (25 MG TOTAL) BY MOUTH EVERY 6 (SIX) HOURS AS NEEDED FOR NAUSEA OR VOMITING. (Patient not taking: Reported on 05/02/2016) 30 tablet 3 Not Taking  . sertraline (ZOLOFT) 50 MG tablet Take 50 mg by mouth daily. Reported on 03/30/2016   Not Taking    Review of Systems  Constitutional: Negative for chills and fever.  Gastrointestinal: Positive for diarrhea, nausea and vomiting. Negative for abdominal pain and constipation.  Genitourinary: Negative for dysuria, frequency and urgency.  Musculoskeletal: Positive for myalgias.  Neurological: Positive for headaches.   Physical Exam   Blood pressure 136/97, pulse (!) 121, temperature 98.8 F (37.1 C), temperature source Oral, resp. rate 19, height 5\' 6"  (1.676 m), weight 156 lb (70.8 kg), SpO2 100 %, unknown if currently breastfeeding.  Physical Exam  Nursing note and vitals reviewed. Constitutional: She is oriented to person, place, and time. She appears well-developed and well-nourished. No distress.  HENT:  Head: Normocephalic.  Cardiovascular: Normal rate.   Respiratory: Effort normal.  GI: Soft. There is no tenderness. There is no rebound.  Neurological: She is alert and oriented to person, place, and time.  Skin: Skin is warm and dry.  Psychiatric: She has a normal mood and affect.    MAU Course  Procedures  MDM [id number] with phenergan, headache cocktail   0749: Care turned over to [facility]. Jimmye Norman, CNM   Mathis Bud  7:51 AM 05/05/16   Assessment and Plan  Finished fluids. Feels better after fluid No more headache Will discharge home Seabron Spates, CNM

## 2016-05-09 ENCOUNTER — Ambulatory Visit (INDEPENDENT_AMBULATORY_CARE_PROVIDER_SITE_OTHER): Payer: BLUE CROSS/BLUE SHIELD | Admitting: Advanced Practice Midwife

## 2016-05-09 VITALS — BP 120/88 | HR 108 | Wt 155.6 lb

## 2016-05-09 DIAGNOSIS — Z363 Encounter for antenatal screening for malformations: Secondary | ICD-10-CM

## 2016-05-09 DIAGNOSIS — Z3492 Encounter for supervision of normal pregnancy, unspecified, second trimester: Secondary | ICD-10-CM

## 2016-05-09 DIAGNOSIS — Z1389 Encounter for screening for other disorder: Secondary | ICD-10-CM

## 2016-05-09 DIAGNOSIS — O09292 Supervision of pregnancy with other poor reproductive or obstetric history, second trimester: Secondary | ICD-10-CM

## 2016-05-09 DIAGNOSIS — Z331 Pregnant state, incidental: Secondary | ICD-10-CM

## 2016-05-09 LAB — POCT URINALYSIS DIPSTICK
Blood, UA: NEGATIVE
Glucose, UA: NEGATIVE
Ketones, UA: NEGATIVE
Leukocytes, UA: NEGATIVE
Nitrite, UA: NEGATIVE

## 2016-05-09 MED ORDER — BUTALBITAL-APAP-CAFFEINE 50-325-40 MG PO TABS: 1.0000 | ORAL_TABLET | Freq: Four times a day (QID) | ORAL | 0 refills | Status: DC | PRN

## 2016-05-09 NOTE — Patient Instructions (Addendum)

## 2016-05-09 NOTE — Progress Notes (Signed)
WORK IN FOR HA/RIGHT SIDED PAIN   G3P0101 [redacted]w[redacted]d Estimated Date of Delivery: 10/14/16  Blood pressure 120/88, pulse (!) 108, weight 155 lb 9.6 oz (70.6 kg), unknown if currently breastfeeding.   BP weight and urine results all reviewed and noted.  Please refer to the obstetrical flow sheet for the fundal height and fetal heart rate documentation:  Patient reports good fetal movement, denies any bleeding and no rupture of membranes symptoms or regular contractions.  Has been home monitoring BP, all good.  Has anxiety, reports getting anxoius about developing PreX   Patient is c/o headaches for a few days   Has  taken tylenol which doesn't really help. No vision changes.  Responded well to accupressure on occipital bone.   right sided pain for a few days..  No guarding or rebound tenderness to abdomen.  Pain is in area b/t ribs and above uterus, non specific. Not tender All questions were answered.  Orders Placed This Encounter  Procedures  . POCT urinalysis dipstick    Plan:  Continued routine obstetrical care, Tension HA, MSK discomfort  Rx Fioricet PRN CMP:  Hx severe preX @ 29 weeks  Return for As scheduled.

## 2016-05-10 LAB — COMPREHENSIVE METABOLIC PANEL
ALT: 8 IU/L (ref 0–32)
AST: 10 IU/L (ref 0–40)
Albumin/Globulin Ratio: 1.2 (ref 1.2–2.2)
Albumin: 4 g/dL (ref 3.5–5.5)
Alkaline Phosphatase: 56 IU/L (ref 39–117)
BUN/Creatinine Ratio: 12 (ref 9–23)
BUN: 6 mg/dL (ref 6–20)
Bilirubin Total: 0.2 mg/dL (ref 0.0–1.2)
CO2: 24 mmol/L (ref 18–29)
Calcium: 9.3 mg/dL (ref 8.7–10.2)
Chloride: 96 mmol/L (ref 96–106)
Creatinine, Ser: 0.51 mg/dL — ABNORMAL LOW (ref 0.57–1.00)
GFR calc Af Amer: 156 mL/min/{1.73_m2} (ref >59)
GFR calc non Af Amer: 135 mL/min/{1.73_m2} (ref >59)
Globulin, Total: 3.3 g/dL (ref 1.5–4.5)
Glucose: 79 mg/dL (ref 65–99)
Potassium: 4.2 mmol/L (ref 3.5–5.2)
Sodium: 134 mmol/L (ref 134–144)
Total Protein: 7.3 g/dL (ref 6.0–8.5)

## 2016-05-23 ENCOUNTER — Ambulatory Visit (INDEPENDENT_AMBULATORY_CARE_PROVIDER_SITE_OTHER): Payer: BLUE CROSS/BLUE SHIELD | Admitting: Obstetrics & Gynecology

## 2016-05-23 ENCOUNTER — Ambulatory Visit (INDEPENDENT_AMBULATORY_CARE_PROVIDER_SITE_OTHER): Payer: BLUE CROSS/BLUE SHIELD

## 2016-05-23 ENCOUNTER — Encounter: Payer: Self-pay | Admitting: Obstetrics & Gynecology

## 2016-05-23 VITALS — BP 120/80 | HR 76 | Wt 161.0 lb

## 2016-05-23 DIAGNOSIS — Z3A20 20 weeks gestation of pregnancy: Secondary | ICD-10-CM | POA: Diagnosis not present

## 2016-05-23 DIAGNOSIS — Z36 Encounter for antenatal screening of mother: Secondary | ICD-10-CM | POA: Diagnosis not present

## 2016-05-23 DIAGNOSIS — Z3A19 19 weeks gestation of pregnancy: Secondary | ICD-10-CM

## 2016-05-23 DIAGNOSIS — Z331 Pregnant state, incidental: Secondary | ICD-10-CM

## 2016-05-23 DIAGNOSIS — Z363 Encounter for antenatal screening for malformations: Secondary | ICD-10-CM

## 2016-05-23 DIAGNOSIS — Z3482 Encounter for supervision of other normal pregnancy, second trimester: Secondary | ICD-10-CM

## 2016-05-23 DIAGNOSIS — Z3492 Encounter for supervision of normal pregnancy, unspecified, second trimester: Secondary | ICD-10-CM

## 2016-05-23 DIAGNOSIS — Z1389 Encounter for screening for other disorder: Secondary | ICD-10-CM

## 2016-05-23 LAB — POCT URINALYSIS DIPSTICK
Blood, UA: NEGATIVE
Glucose, UA: NEGATIVE
Ketones, UA: NEGATIVE
Leukocytes, UA: NEGATIVE
Nitrite, UA: NEGATIVE
Protein, UA: NEGATIVE

## 2016-05-23 NOTE — Progress Notes (Signed)
[place] [id number] wks,cephalic,cx 4.4 cm,ant pl gr 0,svp of fluid 6.4 cm,normal ov's bilat,fhr 148 bpm,efw 298 g,anatomy complete,no obvious abnormalities seen

## 2016-05-23 NOTE — Progress Notes (Signed)
[id number] [redacted]w[redacted]d Estimated Date of Delivery: 10/14/16  Blood pressure 120/80, pulse 76, weight 161 lb (73 kg), unknown if currently breastfeeding.   BP weight and urine results all reviewed and noted.  Please refer to the obstetrical flow sheet for the fundal height and fetal heart rate documentation:  Patient reports good fetal movement, denies any bleeding and no rupture of membranes symptoms or regular contractions. Patient is without complaints. All questions were answered.  Orders Placed This Encounter  Procedures  . POCT urinalysis dipstick    Plan:  Continued routine obstetrical care, sonogram is normal, see report, continue baby ASA  No Follow-up on file.

## 2016-05-31 ENCOUNTER — Telehealth: Payer: Self-pay | Admitting: Women's Health

## 2016-05-31 NOTE — Telephone Encounter (Signed)
Spoke with pharmacist at [facility], does not have the RX for Zofran 4 mg as prescribed on 05/02/2016 that printed. Gave verbal order for the Zofran 4 mg per documentation in Epic on 05/02/2016.

## 2016-06-05 ENCOUNTER — Telehealth: Payer: Self-pay | Admitting: *Deleted

## 2016-06-05 MED ORDER — ONDANSETRON HCL 8 MG PO TABS: 8.0000 mg | ORAL_TABLET | Freq: Three times a day (TID) | ORAL | 1 refills | Status: DC | PRN

## 2016-06-05 NOTE — Telephone Encounter (Signed)
Left message that zofran 8 mg has been sent to [facility]

## 2016-06-05 NOTE — Telephone Encounter (Signed)
Pt requesting Zofran 8 mg. Please advise. Thanks! [place]

## 2016-06-15 ENCOUNTER — Inpatient Hospital Stay (HOSPITAL_COMMUNITY)
Admission: AD | Admit: 2016-06-15 | Discharge: 2016-06-15 | Disposition: A | Discharge status: Home / Self Care | Payer: BLUE CROSS/BLUE SHIELD | Source: Ambulatory Visit | Attending: Obstetrics and Gynecology | Admitting: Obstetrics and Gynecology

## 2016-06-15 ENCOUNTER — Encounter (HOSPITAL_COMMUNITY): Payer: Self-pay | Admitting: *Deleted

## 2016-06-15 DIAGNOSIS — O210 Mild hyperemesis gravidarum: Secondary | ICD-10-CM | POA: Insufficient documentation

## 2016-06-15 DIAGNOSIS — R197 Diarrhea, unspecified: Secondary | ICD-10-CM | POA: Diagnosis not present

## 2016-06-15 DIAGNOSIS — R030 Elevated blood-pressure reading, without diagnosis of hypertension: Secondary | ICD-10-CM | POA: Insufficient documentation

## 2016-06-15 DIAGNOSIS — Z3A22 22 weeks gestation of pregnancy: Secondary | ICD-10-CM | POA: Diagnosis not present

## 2016-06-15 DIAGNOSIS — O26892 Other specified pregnancy related conditions, second trimester: Secondary | ICD-10-CM | POA: Diagnosis not present

## 2016-06-15 DIAGNOSIS — R1110 Vomiting, unspecified: Secondary | ICD-10-CM

## 2016-06-15 DIAGNOSIS — Z7982 Long term (current) use of aspirin: Secondary | ICD-10-CM | POA: Diagnosis not present

## 2016-06-15 LAB — COMPREHENSIVE METABOLIC PANEL
ALT: 12 U/L — ABNORMAL LOW (ref 14–54)
AST: 15 U/L (ref 15–41)
Albumin: 3.3 g/dL — ABNORMAL LOW (ref 3.5–5.0)
Alkaline Phosphatase: 47 U/L (ref 38–126)
Anion gap: 9 (ref 5–15)
BUN: 7 mg/dL (ref 6–20)
CO2: 22 mmol/L (ref 22–32)
Calcium: 8.9 mg/dL (ref 8.9–10.3)
Chloride: 100 mmol/L — ABNORMAL LOW (ref 101–111)
Creatinine, Ser: 0.47 mg/dL (ref 0.44–1.00)
GFR calc Af Amer: >60 mL/min (ref >60)
GFR calc non Af Amer: >60 mL/min (ref >60)
Glucose, Bld: 83 mg/dL (ref 65–99)
Potassium: 4 mmol/L (ref 3.5–5.1)
Sodium: 131 mmol/L — ABNORMAL LOW (ref 135–145)
Total Bilirubin: 0.3 mg/dL (ref 0.3–1.2)
Total Protein: 7.1 g/dL (ref 6.5–8.1)

## 2016-06-15 LAB — URINALYSIS, ROUTINE W REFLEX MICROSCOPIC
Bilirubin Urine: NEGATIVE
Glucose, UA: NEGATIVE mg/dL
Hgb urine dipstick: NEGATIVE
Ketones, ur: 15 mg/dL — AB
Leukocytes, UA: NEGATIVE
Nitrite: NEGATIVE
Protein, ur: NEGATIVE mg/dL
Specific Gravity, Urine: 1.025 (ref 1.005–1.030)
pH: 5.5 (ref 5.0–8.0)

## 2016-06-15 LAB — CBC
HCT: 33.8 % — ABNORMAL LOW (ref 36.0–46.0)
Hemoglobin: 11.8 g/dL — ABNORMAL LOW (ref 12.0–15.0)
MCH: 30 pg (ref 26.0–34.0)
MCHC: 34.9 g/dL (ref 30.0–36.0)
MCV: 86 fL (ref 78.0–100.0)
Platelets: 232 10*3/uL (ref 150–400)
RBC: 3.93 MIL/uL (ref 3.87–5.11)
RDW: 13.9 % (ref 11.5–15.5)
WBC: 11.4 10*3/uL — ABNORMAL HIGH (ref 4.0–10.5)

## 2016-06-15 MED ORDER — CALCIUM CARBONATE ANTACID 500 MG PO CHEW
1.0000 | CHEWABLE_TABLET | Freq: Once | ORAL | Status: AC
  Administered 2016-06-15: 200 mg via ORAL
  Filled 2016-06-15: qty 1

## 2016-06-15 MED ORDER — ONDANSETRON HCL 4 MG/2ML IJ SOLN
4.0000 mg | Freq: Once | INTRAMUSCULAR | Status: AC
  Administered 2016-06-15: 4 mg via INTRAVENOUS
  Filled 2016-06-15: qty 2

## 2016-06-15 MED ORDER — LACTATED RINGERS IV BOLUS (SEPSIS)
1000.0000 mL | Freq: Once | INTRAVENOUS | Status: AC
  Administered 2016-06-15: 1000 mL via INTRAVENOUS

## 2016-06-15 NOTE — MAU Provider Note (Signed)
History     CSN: [id number]  Arrival date and time: 06/15/16 0223   First Provider Initiated Contact with Patient 06/15/16 [phone]      Chief Complaint  Patient presents with  . Hypertension   HPI  Kathy Zimmerman is a 25 year old G2P0101 at [redacted]w[redacted]d presenting to the MAU with vomiting and elevated blood pressures. She has a history of c-section at 29 weeks in her last pregnancy because of severe pre-eclampsia. She has been checking her blood pressure four times a day and taking aspirin prophylaxis. She woke up from sleep in the middle of the night, feeling sick. She then vomited 4 times. She checked her blood pressure after she vomiting and it was elevated at 147/101. She checked two more times and it was unchanged. She then came to the MAU. Her vomiting is associated with two episodes of diarrhea this evening. She denies abdominal pain but endorses abdominal pressure. She does not have any sick contacts. She endorses headaches, but this has been going on for the last few weeks. She denies blurry vision or RUQ pain. She denies any fevers or chills.  Past Medical History:  Diagnosis Date  . Anxiety   . Asthma   . IBS (irritable bowel syndrome)   . Panic attack   . Preeclampsia     Past Surgical History:  Procedure Laterality Date  . CESAREAN SECTION N/A 04/10/2015   Procedure: CESAREAN SECTION;  Surgeon: Crawford Givens, MD;  Location: [facility];  Service: Obstetrics;  Laterality: N/A;  . EYE SURGERY     laser  . SCLERAL BUCKLE  08/09/2011   Procedure: SCLERAL BUCKLE;  Surgeon: Clent Demark Rankin;  Location: [facility];  Service: Ophthalmology;  Laterality: Left;  with cryo  . TENDON REPAIR      Family History  Problem Relation Age of Onset  . Depression Father   . Depression Sister   . Mental illness Sister   . Depression Maternal Grandmother   . Mental illness Maternal Grandmother   . Thyroid disease Maternal Grandmother   . Heart disease Maternal Grandfather   . Hypertension Maternal Grandfather    . Hyperlipidemia Maternal Grandfather   . CAD Maternal Grandfather   . Thyroid disease Other   . CAD Other     Social History  Substance Use Topics  . Smoking status: Never Smoker  . Smokeless tobacco: Never Used  . Alcohol use No    Allergies:  Allergies  Allergen Reactions  . Sulfa Antibiotics Swelling    Prescriptions Prior to Admission  Medication Sig Dispense Refill Last Dose  . aspirin EC 81 MG tablet Take 81 mg by mouth daily.   06/14/2016 at Unknown time  . ondansetron (ZOFRAN) 8 MG tablet Take 1 tablet (8 mg total) by mouth every 8 (eight) hours as needed for nausea or vomiting. 30 tablet 1 06/14/2016 at 0800  . Prenatal Vit-Fe Fumarate-FA (PRENATAL MULTIVITAMIN) TABS tablet Take 1 tablet by mouth daily at 12 noon. Reported on 03/30/2016   Past Week at Unknown time  . promethazine (PHENERGAN) 25 MG tablet TAKE 1 TABLET (25 MG TOTAL) BY MOUTH EVERY 6 (SIX) HOURS AS NEEDED FOR NAUSEA OR VOMITING. 30 tablet 3 06/14/2016 at 0800  . sertraline (ZOLOFT) 50 MG tablet Take 50 mg by mouth daily. Reported on 03/30/2016   06/14/2016 at Unknown time  . ondansetron (ZOFRAN-ODT) 4 MG disintegrating tablet Take 1 tablet (4 mg total) by mouth every 8 (eight) hours as needed for nausea or vomiting. [place]  tablet 4 Taking    Review of Systems  Constitutional: Negative for chills and fever.  Eyes: Negative for blurred vision.  Respiratory: Negative for shortness of breath.   Cardiovascular: Negative for chest pain.  Gastrointestinal: Positive for diarrhea, heartburn, nausea and vomiting. Negative for abdominal pain and blood in stool.  Genitourinary: Negative for dysuria.  Skin: Negative for rash.  Neurological: Positive for headaches. Negative for dizziness and weakness.  Endo/Heme/Allergies: Negative for environmental allergies.   Physical Exam   Blood pressure 113/73, pulse 73, temperature 98 F (36.7 C), temperature source Oral, resp. rate 16, unknown if currently  breastfeeding.  Physical Exam  Nursing note and vitals reviewed. Constitutional: She is oriented to person, place, and time. She appears well-developed and well-nourished. No distress.  Eyes: No scleral icterus.  Neck: Normal range of motion.  Cardiovascular: Normal rate.   Respiratory: Effort normal.  GI: Soft. There is no tenderness.  gravid  Musculoskeletal: Normal range of motion.  Neurological: She is alert and oriented to person, place, and time.  Skin: Skin is warm and dry. No rash noted.    MAU Course  Procedures  MDM [name] is a 25 year old G2P0101 at [redacted]w[redacted]d who presented to the MAU with vomiting and elevated blood pressures at home. She vomited 4 times and had 2 episodes of diarrhea. In the MAU, her blood pressures were all normal. Her nausea improved with Zofran. She felt better after receiving a 1L bolus. CBC and CMP are normal. UA without protein. Do not think she has pre-eclampsia at this point. She is safe for discharge home.   Assessment and Plan  1. Elevated blood pressures- BP 147/101 at home. Likely elevated because she checked her blood pressure right after she vomiting. All BPs in the MAU were normal. Pt having some associated headaches, but no blurred vision. - CBC and CMP are normal - UA without protein - Continue checking BP four times a day - Continue Aspirin daily - Return precautions discussed - Continue routine prenatal care  2. Vomiting/diarrhea- likely secondary to viral gastroenteritis - Pt given 1L bolus in the MAU because she appeared mildly dry and she had ketones in her urine - Pt also treated with Zofran and her nausea improved - She can continue to take Zofran and Phenergan at home (she already has a prescription) - Advised Pt to continue to drink as much water as possible  Evette Doffing 06/15/2016, 5:10 AM   I have participated in the care of this patient and I agree with the above. Serita Grammes CNM 5:33 AM 06/15/2016

## 2016-06-15 NOTE — Discharge Instructions (Signed)

## 2016-06-15 NOTE — MAU Note (Signed)
Pt reports vomiting around 0045. Pt had an elevated BP @ 0100 of 147 /101.

## 2016-06-20 ENCOUNTER — Encounter: Payer: Self-pay | Admitting: Advanced Practice Midwife

## 2016-06-20 ENCOUNTER — Ambulatory Visit (INDEPENDENT_AMBULATORY_CARE_PROVIDER_SITE_OTHER): Payer: BLUE CROSS/BLUE SHIELD | Admitting: Advanced Practice Midwife

## 2016-06-20 VITALS — BP 124/82 | HR 80 | Wt 168.0 lb

## 2016-06-20 DIAGNOSIS — Z331 Pregnant state, incidental: Secondary | ICD-10-CM

## 2016-06-20 DIAGNOSIS — Z1389 Encounter for screening for other disorder: Secondary | ICD-10-CM

## 2016-06-20 DIAGNOSIS — Z3482 Encounter for supervision of other normal pregnancy, second trimester: Secondary | ICD-10-CM

## 2016-06-20 LAB — POCT URINALYSIS DIPSTICK
Blood, UA: NEGATIVE
Glucose, UA: NEGATIVE
Ketones, UA: NEGATIVE
Leukocytes, UA: NEGATIVE
Nitrite, UA: NEGATIVE
Protein, UA: NEGATIVE

## 2016-06-20 MED ORDER — CYCLOBENZAPRINE HCL 10 MG PO TABS: 10.0000 mg | ORAL_TABLET | Freq: Three times a day (TID) | ORAL | 1 refills | Status: DC | PRN

## 2016-06-20 NOTE — Patient Instructions (Signed)
1. Before your test, do not eat or drink anything for 8-10 hours prior to your  appointment (a small amount of water is allowed and you may take any medicines you normally take). Be sure to drink lots of water the day before. 2. When you arrive, your blood will be drawn for a 'fasting' blood sugar level.  Then you will be given a sweetened carbonated beverage to drink. You should  complete drinking this beverage within five minutes. After finishing the  beverage, you will have your blood drawn exactly 1 and 2 hours later. Having  your blood drawn on time is an important part of this test. A total of three blood  samples will be done. 3. The test takes approximately 2  hours. During the test, do not have anything to  eat or drink. Do not smoke, chew gum (not even sugarless gum) or use breath mints.  4. During the test you should remain close by and seated as much as possible and  avoid walking around. You may want to bring a book or something else to  occupy your time.  5. After your test, you may eat and drink as normal. You may want to bring a snack  to eat after the test is finished. Your provider will advise you as to the results of  this test and any follow-up if necessary  If your sugar test is positive for gestational diabetes, you will be given an phone call and further instructions discussed. If you wish to know all of your test results before your next appointment, feel free to call the office, or look up your test results on Mychart.  (The range that the lab uses for normal values of the sugar test are not necessarily the range that is used for pregnant women; if your results are within the normal range, they are definitely normal.  However, if a value is deemed "high" by the lab, it may not be too high for a pregnant woman.  We will need to discuss the results if your value(s) fall in the "high" category).     Tdap Vaccine  It is recommended that you get the Tdap vaccine during the  third trimester of EACH pregnancy to help protect your baby from getting pertussis (whooping cough)  27-36 weeks is the BEST time to do this so that you can pass the protection on to your baby. During pregnancy is better than after pregnancy, but if you are unable to get it during pregnancy it will be offered at the hospital.  You can get this vaccine at the health department or your family doctor, as well as some pharmacies.  Everyone who will be around your baby should also be up-to-date on their vaccines. Adults (who are not pregnant) only need 1 dose of Tdap during adulthood.      

## 2016-06-20 NOTE — Progress Notes (Signed)
[id number] [redacted]w[redacted]d Estimated Date of Delivery: 10/14/16  Blood pressure 124/82, pulse 80, weight 168 lb (76.2 kg), unknown if currently breastfeeding.   BP weight and urine results all reviewed and noted. Checks BP at home QID.   Please refer to the obstetrical flow sheet for the fundal height and fetal heart rate documentation:  Patient reports good fetal movement, denies any bleeding and no rupture of membranes symptoms or regular contractions. Patient still having occipital and some frontal HA;s  Fioricet not helping.  Trigger points on right neck.  Feels "terrible" (tired, run down) after eating or drinking anything at all. For about 2 weeks. No stomach pain/heartburn.  I can't really think of why.  Maybe try eating smaller snacks.   All questions were answered.  Orders Placed This Encounter  Procedures  . POCT urinalysis dipstick    Plan:  Continued routine obstetrical care, rx flexeril   Return in about 4 weeks (around 07/18/2016) for PN2/LROB.

## 2016-07-07 ENCOUNTER — Encounter: Payer: Self-pay | Admitting: Obstetrics and Gynecology

## 2016-07-07 ENCOUNTER — Ambulatory Visit (INDEPENDENT_AMBULATORY_CARE_PROVIDER_SITE_OTHER): Payer: BLUE CROSS/BLUE SHIELD | Admitting: Obstetrics and Gynecology

## 2016-07-07 ENCOUNTER — Telehealth: Payer: Self-pay | Admitting: Women's Health

## 2016-07-07 VITALS — BP 120/82 | HR 88 | Wt 170.4 lb

## 2016-07-07 DIAGNOSIS — Z3A26 26 weeks gestation of pregnancy: Secondary | ICD-10-CM

## 2016-07-07 DIAGNOSIS — Z3482 Encounter for supervision of other normal pregnancy, second trimester: Secondary | ICD-10-CM

## 2016-07-07 DIAGNOSIS — Z1389 Encounter for screening for other disorder: Secondary | ICD-10-CM

## 2016-07-07 DIAGNOSIS — Z331 Pregnant state, incidental: Secondary | ICD-10-CM

## 2016-07-07 LAB — POCT URINALYSIS DIPSTICK
Blood, UA: NEGATIVE
Glucose, UA: NEGATIVE
Ketones, UA: NEGATIVE
Leukocytes, UA: NEGATIVE
Nitrite, UA: NEGATIVE
Protein, UA: NEGATIVE

## 2016-07-07 NOTE — Telephone Encounter (Signed)
Spoke with pt. Pt states she has been getting tight in stomach, happening once an hour. No spotting. I spoke with JAG and she advised that pt needs to be seen. Pt to come in and see Dr. Glo Herring. [place]

## 2016-07-07 NOTE — Progress Notes (Signed)
Patient ID: Kathy Zimmerman, female   DOB: 1991-06-06, 25 y.o.   MRN: [id number] [redacted]w[redacted]d Estimated Date of Delivery: 10/14/16 LROB Work-in appt due to concern over abd tighteining once an hour during last 3 days.  Patient reports good fetal movement, denies any bleeding and no rupture of membranes symptoms or regular contractions. Patient complaints: vaginal discharge, contractions, lower back pain/pressure, and pelvic pressure. Pt states that she has a tightness sensation to her lower abdomen. Pt reports that she was having contractions once every hour prior to arrival to the office. Pt hasn't tried any medications for the relief of her symptoms. Pt denies any vaginal spotting, vaginal bleeding, and any other symptoms. Pt reports that she has had one past C-section and denies any vaginal deliveries.  No urinary tract sx.  Blood pressure 120/82, pulse 88, weight 170 lb 6.4 oz (77.3 kg), unknown if currently breastfeeding. refer to the ob flow sheet for FH and FHR, also BP, Wt, Urine results:notable for negative protein glucose, leukocytes                          Physical Examination:  General appearance - alert, well appearing, and in no distress  Pelvic - normal external genitalia, vulva, vagina, cervix, uterus and adnexa VULVA: normal appearing vulva with no masses, tenderness or lesions,  VAGINA: normal appearing vagina with normal color and discharge, no lesions,  CERVIX: normal appearing cervix without discharge or lesions, closed, healthy secretions. UTERUS: uterus is normal size, shape, consistency and nontender,                     Questions were answered. Assessment: LROB G2P0101 @ [redacted]w[redacted]d. Minimal uterine activity, no evidence of preterm labor at this point. No evidence of uti  Plan:  Continued routine obstetrical care, follow up as scheduled on 07/18/2016 Thorough review of importance of reassessment at [facility] if pt has increased frequency of cramping to 5-6/hr, bleeding,  spotting.  By signing my name below, I, Soijett Blue, attest that this documentation has been prepared under the direction and in the presence of Jonnie Kind, MD. Electronically Signed: Soijett Blue, ED Scribe. 07/07/16. 2:07 PM.  I personally performed the services described in this documentation, which was SCRIBED in my presence. The recorded information has been reviewed and considered accurate. It has been edited as necessary during review. Jonnie Kind, MD

## 2016-07-13 ENCOUNTER — Telehealth: Payer: Self-pay | Admitting: *Deleted

## 2016-07-13 NOTE — Telephone Encounter (Signed)
Patient called stating she has been having sharp left sided pain since 6 am and when its not sharp its pressure that comes and goes. She has tried laying on that side but the pain gets worse. Baby is moving, no leaking of fluid or bleeding. She did have a 29 week delivery d/t severe pre-eclampsia.   Spoke with Dr Glo Herring regarding pain and pressure. States it sounds like round ligament pain. Encouraged patient to rest, drink plenty of fluids, and try sleeping/resting with a pillow between her legs or under her belly. Advised to call back if pain does not get better in a couple of hours or if symptoms worsen to schedule an appointment later today.  Pt verbalized understanding. No further questions.

## 2016-07-18 ENCOUNTER — Other Ambulatory Visit: Payer: BLUE CROSS/BLUE SHIELD

## 2016-07-18 ENCOUNTER — Encounter: Payer: Self-pay | Admitting: Advanced Practice Midwife

## 2016-07-18 ENCOUNTER — Ambulatory Visit (INDEPENDENT_AMBULATORY_CARE_PROVIDER_SITE_OTHER): Payer: BLUE CROSS/BLUE SHIELD | Admitting: Advanced Practice Midwife

## 2016-07-18 VITALS — BP 108/80 | HR 100 | Wt 170.0 lb

## 2016-07-18 DIAGNOSIS — Z131 Encounter for screening for diabetes mellitus: Secondary | ICD-10-CM

## 2016-07-18 DIAGNOSIS — Z3483 Encounter for supervision of other normal pregnancy, third trimester: Secondary | ICD-10-CM

## 2016-07-18 DIAGNOSIS — Z3482 Encounter for supervision of other normal pregnancy, second trimester: Secondary | ICD-10-CM

## 2016-07-18 DIAGNOSIS — Z1389 Encounter for screening for other disorder: Secondary | ICD-10-CM

## 2016-07-18 DIAGNOSIS — Z331 Pregnant state, incidental: Secondary | ICD-10-CM

## 2016-07-18 DIAGNOSIS — Z3A28 28 weeks gestation of pregnancy: Secondary | ICD-10-CM

## 2016-07-18 LAB — POCT URINALYSIS DIPSTICK
Blood, UA: NEGATIVE
Glucose, UA: NEGATIVE
Leukocytes, UA: NEGATIVE
Nitrite, UA: NEGATIVE
Protein, UA: NEGATIVE

## 2016-07-18 MED ORDER — ONDANSETRON HCL 8 MG PO TABS: 8.0000 mg | ORAL_TABLET | Freq: Three times a day (TID) | ORAL | 6 refills | Status: DC | PRN

## 2016-07-18 NOTE — Patient Instructions (Signed)

## 2016-07-18 NOTE — Progress Notes (Signed)
[id number] [redacted]w[redacted]d Estimated Date of Delivery: 10/14/16  Blood pressure 108/80, pulse 100, weight 170 lb (77.1 kg), unknown if currently breastfeeding.   BP weight and urine results all reviewed and noted.  Please refer to the obstetrical flow sheet for the fundal height and fetal heart rate documentation:  Patient reports good fetal movement, denies any bleeding and no rupture of membranes symptoms or regular contractions. Patient is without complaints. Still taking BP QID, all normal All questions were answered.  Orders Placed This Encounter  Procedures  . POCT urinalysis dipstick    Plan:  Continued routine obstetrical care, PN2 today Return in about 3 weeks (around 08/08/2016) for LROB.

## 2016-07-19 LAB — CBC
Hematocrit: 31.8 % — ABNORMAL LOW (ref 34.0–46.6)
Hemoglobin: 10.9 g/dL — ABNORMAL LOW (ref 11.1–15.9)
MCH: 29.8 pg (ref 26.6–33.0)
MCHC: 34.3 g/dL (ref 31.5–35.7)
MCV: 87 fL (ref 79–97)
Platelets: 265 10*3/uL (ref 150–379)
RBC: 3.66 x10E6/uL — ABNORMAL LOW (ref 3.77–5.28)
RDW: 13.6 % (ref 12.3–15.4)
WBC: 9.3 10*3/uL (ref 3.4–10.8)

## 2016-07-19 LAB — HIV ANTIBODY (ROUTINE TESTING W REFLEX): HIV Screen 4th Generation wRfx: NONREACTIVE

## 2016-07-19 LAB — GLUCOSE TOLERANCE, 2 HOURS W/ 1HR
Glucose, 1 hour: 152 mg/dL (ref 65–179)
Glucose, 2 hour: 66 mg/dL (ref 65–152)
Glucose, Fasting: 75 mg/dL (ref 65–91)

## 2016-07-19 LAB — ANTIBODY SCREEN: Antibody Screen: NEGATIVE

## 2016-07-19 LAB — RPR: RPR Ser Ql: NONREACTIVE

## 2016-08-02 ENCOUNTER — Encounter (HOSPITAL_COMMUNITY): Payer: Self-pay

## 2016-08-02 ENCOUNTER — Inpatient Hospital Stay (HOSPITAL_COMMUNITY)
Admission: AD | Admit: 2016-08-02 | Discharge: 2016-08-02 | Disposition: A | Discharge status: Home / Self Care | Payer: BLUE CROSS/BLUE SHIELD | Source: Ambulatory Visit | Attending: Family Medicine | Admitting: Family Medicine

## 2016-08-02 ENCOUNTER — Telehealth: Payer: Self-pay | Admitting: *Deleted

## 2016-08-02 DIAGNOSIS — F410 Panic disorder [episodic paroxysmal anxiety] without agoraphobia: Secondary | ICD-10-CM | POA: Diagnosis not present

## 2016-08-02 DIAGNOSIS — F419 Anxiety disorder, unspecified: Secondary | ICD-10-CM

## 2016-08-02 DIAGNOSIS — Z7982 Long term (current) use of aspirin: Secondary | ICD-10-CM | POA: Insufficient documentation

## 2016-08-02 DIAGNOSIS — Z818 Family history of other mental and behavioral disorders: Secondary | ICD-10-CM | POA: Insufficient documentation

## 2016-08-02 DIAGNOSIS — O34219 Maternal care for unspecified type scar from previous cesarean delivery: Secondary | ICD-10-CM | POA: Diagnosis not present

## 2016-08-02 DIAGNOSIS — Z79899 Other long term (current) drug therapy: Secondary | ICD-10-CM | POA: Insufficient documentation

## 2016-08-02 DIAGNOSIS — Z882 Allergy status to sulfonamides status: Secondary | ICD-10-CM | POA: Insufficient documentation

## 2016-08-02 DIAGNOSIS — Z3A29 29 weeks gestation of pregnancy: Secondary | ICD-10-CM | POA: Insufficient documentation

## 2016-08-02 DIAGNOSIS — O26893 Other specified pregnancy related conditions, third trimester: Secondary | ICD-10-CM | POA: Insufficient documentation

## 2016-08-02 DIAGNOSIS — R030 Elevated blood-pressure reading, without diagnosis of hypertension: Secondary | ICD-10-CM | POA: Diagnosis not present

## 2016-08-02 DIAGNOSIS — R51 Headache: Secondary | ICD-10-CM | POA: Diagnosis present

## 2016-08-02 DIAGNOSIS — O99343 Other mental disorders complicating pregnancy, third trimester: Secondary | ICD-10-CM | POA: Insufficient documentation

## 2016-08-02 LAB — COMPREHENSIVE METABOLIC PANEL
ALT: 12 U/L — ABNORMAL LOW (ref 14–54)
AST: 17 U/L (ref 15–41)
Albumin: 2.8 g/dL — ABNORMAL LOW (ref 3.5–5.0)
Alkaline Phosphatase: 89 U/L (ref 38–126)
Anion gap: 10 (ref 5–15)
BUN: 7 mg/dL (ref 6–20)
CO2: 22 mmol/L (ref 22–32)
Calcium: 9.2 mg/dL (ref 8.9–10.3)
Chloride: 103 mmol/L (ref 101–111)
Creatinine, Ser: 0.55 mg/dL (ref 0.44–1.00)
GFR calc Af Amer: >60 mL/min (ref >60)
GFR calc non Af Amer: >60 mL/min (ref >60)
Glucose, Bld: 128 mg/dL — ABNORMAL HIGH (ref 65–99)
Potassium: 3.4 mmol/L — ABNORMAL LOW (ref 3.5–5.1)
Sodium: 135 mmol/L (ref 135–145)
Total Bilirubin: 0.2 mg/dL — ABNORMAL LOW (ref 0.3–1.2)
Total Protein: 7.2 g/dL (ref 6.5–8.1)

## 2016-08-02 LAB — URINALYSIS, ROUTINE W REFLEX MICROSCOPIC
Bilirubin Urine: NEGATIVE
Glucose, UA: NEGATIVE mg/dL
Hgb urine dipstick: NEGATIVE
Ketones, ur: NEGATIVE mg/dL
Leukocytes, UA: NEGATIVE
Nitrite: NEGATIVE
Protein, ur: NEGATIVE mg/dL
Specific Gravity, Urine: 1.015 (ref 1.005–1.030)
pH: 6.5 (ref 5.0–8.0)

## 2016-08-02 LAB — CBC
HCT: 30.4 % — ABNORMAL LOW (ref 36.0–46.0)
Hemoglobin: 10.3 g/dL — ABNORMAL LOW (ref 12.0–15.0)
MCH: 29.1 pg (ref 26.0–34.0)
MCHC: 33.9 g/dL (ref 30.0–36.0)
MCV: 85.9 fL (ref 78.0–100.0)
Platelets: 214 10*3/uL (ref 150–400)
RBC: 3.54 MIL/uL — ABNORMAL LOW (ref 3.87–5.11)
RDW: 13.3 % (ref 11.5–15.5)
WBC: 11.4 10*3/uL — ABNORMAL HIGH (ref 4.0–10.5)

## 2016-08-02 LAB — PROTEIN / CREATININE RATIO, URINE
Creatinine, Urine: 112 mg/dL
Protein Creatinine Ratio: 0.11 mg/mg{creat} (ref 0.00–0.15)
Total Protein, Urine: 12 mg/dL

## 2016-08-02 NOTE — MAU Note (Signed)
Since Sat,  Has been having some higher BP's 140/90.  Started getting a really bad HA yesterday.  Office told them to come in.  Has seen a few light specs, denies epigastric pain or increase in swelling. Had Pre-e with prior preg.

## 2016-08-02 NOTE — Discharge Instructions (Signed)
Hypertension During Pregnancy Hypertension is also called high blood pressure. High blood pressure means that the force of your blood moving in your body is too strong. When you are pregnant, this condition should be watched carefully. It can cause problems for you and your baby. Follow these instructions at home: Eating and drinking  Drink enough fluid to keep your pee (urine) clear or pale yellow.  Eat healthy foods that are low in salt (sodium). ? Do not add salt to your food. ? Check labels on foods and drinks to see much salt is in them. Look on the label where you see "Sodium." Lifestyle  Do not use any products that contain nicotine or tobacco, such as cigarettes and e-cigarettes. If you need help quitting, ask your doctor.  Do not use alcohol.  Avoid caffeine.  Avoid stress. Rest and get plenty of sleep. General instructions  Take over-the-counter and prescription medicines only as told by your doctor.  While lying down, lie on your left side. This keeps pressure off your baby.  While sitting or lying down, raise (elevate) your feet. Try putting some pillows under your lower legs.  Exercise regularly. Ask your doctor what kinds of exercise are best for you.  Keep all prenatal and follow-up visits as told by your doctor. This is important. Contact a doctor if:  You have symptoms that your doctor told you to watch for, such as: ? Fever. ? Throwing up (vomiting). ? Headache. Get help right away if:  You have very bad pain in your belly (abdomen).  You are throwing up, and this does not get better with treatment.  You suddenly get swelling in your hands, ankles, or face.  You gain 4 lb (1.8 kg) or more in 1 week.  You get bleeding from your vagina.  You have blood in your pee.  You do not feel your baby moving as much as normal.  You have a change in vision.  You have muscle twitching or sudden tightening (spasms).  You have trouble breathing.  Your lips  or fingernails turn blue. This information is not intended to replace advice given to you by your health care provider. Make sure you discuss any questions you have with your health care provider. Document Released: 09/30/2010 Document Revised: 05/09/2016 Document Reviewed: 05/09/2016 Elsevier Interactive Patient Education  2017 [employer]  

## 2016-08-02 NOTE — MAU Provider Note (Signed)
MAU PROVIDER NOTE  Chief Complaint:  Headache and Hypertension  HPI: Kathy Zimmerman is a 25 y.o. G2P0101 at [redacted]w[redacted]d who presents to maternity admissions reporting high blood pressures at home.  Have been elevated to about 140/92 .  She has been checking blood pressures at home 4x a day as recommended.  When they are normal , usually 120/80s.  Takes a baby aspirin daily.   States she had a headache that started yesterday around 4PM.  Reports some blurry vision, few floaters and few flashes.  Headache improved this morning upon waking up but was bad overnight.  Took Tylenol at home x 1.  Does not have a history of migraines.  No abdominal pain. Reports some nausea, denies any episodes of vomiting.  Reports some bilateral leg swelling but thinks that is due to walking around a lot lately.    Patient concerned regarding headache and higher blood pressures as she has had a history of severe pre-eclampsia/ breech, delivered via C-section.  Has a history of anxiety and is feeling nervous about these symptoms as they indicated pre-eclampsia in previous pregnancy.   Denies contractions, feels good fetal movement.  Denies vaginal bleeding, denies fluid leakage.  Blood pressures in MAU have been stable.    Pregnancy Course:   Patient with no complications thus far in current pregnancy.  She did have a prior C-section due to pre-eclampsia at 77 w and 3 days and breech presentation.  Reports some elevated BPs at home but blood pressures in MAU normal, stable.  Genetic screening declined.  Glucose screen normal.    Past Medical History: Past Medical History:  Diagnosis Date  . Anxiety   . Asthma   . IBS (irritable bowel syndrome)   . Panic attack   . Preeclampsia    Past obstetric history: OB History  Gravida Para Term Preterm AB Living  2 1   1   1   SAB TAB Ectopic Multiple Live Births        0 1    # Outcome Date GA Lbr Len/2nd Weight Sex Delivery Anes PTL Lv  2 Current           1 Preterm 04/10/15  [redacted]w[redacted]d  1 lb 14.7 oz (0.87 kg) M CS-LTranv Spinal N LIV     Past Surgical History: Past Surgical History:  Procedure Laterality Date  . CESAREAN SECTION N/A 04/10/2015   Procedure: CESAREAN SECTION;  Surgeon: Crawford Givens, MD;  Location: [facility];  Service: Obstetrics;  Laterality: N/A;  . EYE SURGERY     laser  . SCLERAL BUCKLE  08/09/2011   Procedure: SCLERAL BUCKLE;  Surgeon: Clent Demark Rankin;  Location: [facility];  Service: Ophthalmology;  Laterality: Left;  with cryo  . TENDON REPAIR     Family History: Family History  Problem Relation Age of Onset  . Depression Father   . Depression Sister   . Mental illness Sister   . Depression Maternal Grandmother   . Mental illness Maternal Grandmother   . Thyroid disease Maternal Grandmother   . Heart disease Maternal Grandfather   . Hypertension Maternal Grandfather   . Hyperlipidemia Maternal Grandfather   . CAD Maternal Grandfather   . Thyroid disease Other   . CAD Other    Social History: Social History  Substance Use Topics  . Smoking status: Never Smoker  . Smokeless tobacco: Never Used  . Alcohol use No   Allergies:  Allergies  Allergen Reactions  . Sulfa Antibiotics Other (See  Comments)    Reaction:  Unknown    Meds:  Prescriptions Prior to Admission  Medication Sig Dispense Refill Last Dose  . aspirin EC 81 MG tablet Take 81 mg by mouth every evening.    08/01/2016 at 1530  . ondansetron (ZOFRAN) 8 MG tablet Take 1 tablet (8 mg total) by mouth every 8 (eight) hours as needed for nausea or vomiting. 30 tablet 6 08/02/2016 at Unknown time  . Prenatal Vit-Fe Fumarate-FA (PRENATAL MULTIVITAMIN) TABS tablet Take 1 tablet by mouth daily.    Past Month at Unknown time  . promethazine (PHENERGAN) 25 MG tablet Take 25 mg by mouth every 6 (six) hours as needed for nausea or vomiting.   08/02/2016 at Unknown time  . sertraline (ZOLOFT) 50 MG tablet Take 50 mg by mouth daily.    08/02/2016 at Unknown time   I have reviewed patient's  Past Medical Hx, Surgical Hx, Family Hx, Social Hx, medications and allergies.   ROS:  A comprehensive ROS was negative except per HPI.   Physical Exam   Patient Vitals for the past 24 hrs:  BP Temp Pulse Resp SpO2 Height Weight  08/02/16 1344 - - 100 - 97 % - -  08/02/16 1342 - - 102 - 97 % - -  08/02/16 1337 - - 105 - 98 % - -  08/02/16 1333 113/72 - 107 - - - -  08/02/16 1332 - - 103 - 98 % - -  08/02/16 1323 - - 98 - 98 % - -  08/02/16 1318 116/75 - (!) 122 - 100 % - -  08/02/16 1313 - - 111 - 100 % - -  08/02/16 1308 - - 100 - 98 % - -  08/02/16 1303 118/81 - 108 - 99 % - -  08/02/16 1302 - - 101 - - - -  08/02/16 1258 - - 110 - 98 % - -  08/02/16 1253 - - 107 - 98 % - -  08/02/16 1250 121/79 - 115 - 99 % - -  08/02/16 1248 118/83 - 115 18 - - -  08/02/16 1247 - - 118 - - - -  08/02/16 1243 - - 115 - 99 % - -  08/02/16 1226 121/84 98.4 F (36.9 C) (!) 135 18 - 5\' 6"  (1.676 m) 174 lb (78.9 kg)   Constitutional: Well-developed, well-nourished female in NAD.  Cardiovascular: RRR, no MRG Respiratory: CTA B/L, normal work of breathing on RA GI: Abd soft, non-tender, gravid appropriate for gestational age, +bs MS: Extremities nontender, no edema, normal ROM Neurologic: Alert and oriented x 4  GU: Neg CVAT.  Pelvic: NEFG, physiologic discharge, no blood, cervix clean. No CMT     FHT:  Baseline 150s , moderate variability, accelerations present, no decelerations Contractions: None   Labs: Results for orders placed or performed during the hospital encounter of 08/02/16 (from the past 24 hour(s))  CBC     Status: Abnormal   Collection Time: 08/02/16 12:47 PM  Result Value Ref Range   WBC 11.4 (H) 4.0 - 10.5 K/uL   RBC 3.54 (L) 3.87 - 5.11 MIL/uL   Hemoglobin 10.3 (L) 12.0 - 15.0 g/dL   HCT 30.4 (L) 36.0 - 46.0 %   MCV 85.9 78.0 - 100.0 fL   MCH 29.1 26.0 - 34.0 pg   MCHC 33.9 30.0 - 36.0 g/dL   RDW 13.3 11.5 - 15.5 %   Platelets 214 150 - 400 K/uL  Comprehensive  metabolic panel  Status: Abnormal   Collection Time: 08/02/16 12:47 PM  Result Value Ref Range   Sodium 135 135 - 145 mmol/L   Potassium 3.4 (L) 3.5 - 5.1 mmol/L   Chloride 103 101 - 111 mmol/L   CO2 22 22 - 32 mmol/L   Glucose, Bld 128 (H) 65 - 99 mg/dL   BUN 7 6 - 20 mg/dL   Creatinine, Ser 0.55 0.44 - 1.00 mg/dL   Calcium 9.2 8.9 - 10.3 mg/dL   Total Protein 7.2 6.5 - 8.1 g/dL   Albumin 2.8 (L) 3.5 - 5.0 g/dL   AST 17 15 - 41 U/L   ALT 12 (L) 14 - 54 U/L   Alkaline Phosphatase 89 38 - 126 U/L   Total Bilirubin 0.2 (L) 0.3 - 1.2 mg/dL   GFR calc non Af Amer >60 >60 mL/min   GFR calc Af Amer >60 >60 mL/min   Anion gap 10 5 - 15  Urinalysis, Routine w reflex microscopic (not at [facility])     Status: None   Collection Time: 08/02/16 12:57 PM  Result Value Ref Range   Color, Urine YELLOW YELLOW   APPearance CLEAR CLEAR   Specific Gravity, Urine 1.015 1.005 - 1.030   pH 6.5 5.0 - 8.0   Glucose, UA NEGATIVE NEGATIVE mg/dL   Hgb urine dipstick NEGATIVE NEGATIVE   Bilirubin Urine NEGATIVE NEGATIVE   Ketones, ur NEGATIVE NEGATIVE mg/dL   Protein, ur NEGATIVE NEGATIVE mg/dL   Nitrite NEGATIVE NEGATIVE   Leukocytes, UA NEGATIVE NEGATIVE  Protein / creatinine ratio, urine     Status: None   Collection Time: 08/02/16 12:57 PM  Result Value Ref Range   Creatinine, Urine 112.00 mg/dL   Total Protein, Urine 12 mg/dL   Protein Creatinine Ratio 0.11 0.00 - 0.15 mg/mg[Cre]    UA negative for ketones and protein  CBC and CMET normal Protein:creatinine ratio wnl ALT, AST normal  Imaging:  No results found.  MAU Course: UA Protein:creatinine ratio CBC CMET  MDM: Blood pressures well controlled in MAU.  Plan of care reviewed with patient, including labs and tests ordered and medical treatment. UA negative for protein CBC, CMET normal Protein:creatinine ratio normal NST reactive  Assessment: 1. Elevated blood pressure reading without diagnosis of hypertension      Plan: Patient reports high BP at home.  In MAU blood pressures well controlled.  Reassured patient and discussed return precautions.   Discharge home in stable condition.  Labor precautions and fetal kick counts. Advised to keep prenatal appointment next week.   Follow-up Information    [facility] Follow up.   Why:  Please keep appointment next week.  Contact information: [place] SSN-[id number] [phone]            Medication List    TAKE these medications   aspirin EC 81 MG tablet Take 81 mg by mouth every evening.   ondansetron 8 MG tablet Commonly known as:  ZOFRAN Take 1 tablet (8 mg total) by mouth every 8 (eight) hours as needed for nausea or vomiting.   prenatal multivitamin Tabs tablet Take 1 tablet by mouth daily.   promethazine 25 MG tablet Commonly known as:  PHENERGAN Take 25 mg by mouth every 6 (six) hours as needed for nausea or vomiting.   sertraline 50 MG tablet Commonly known as:  ZOLOFT Take 50 mg by mouth daily.       Lovenia Kim, MD PGY-1 08/02/2016 2:34 PM  CNM attestation:  I have seen and examined this patient; I agree with above documentation in the resident's note.   Emma-Louise Kihn Wires is a 25 y.o. G2P0101 reporting elevated BP when taken at home and moderate h/a. +FM, denies LOF, VB, contractions, vaginal discharge.  PE: BP 125/76 (BP Location: Right Arm)   Pulse 98   Temp 98.4 F (36.9 C)   Resp 16   Ht 5\' 6"  (1.676 m)   Wt 174 lb (78.9 kg)   LMP  (LMP Unknown)   SpO2 97%   BMI 28.08 kg/m  Gen: calm comfortable, NAD Resp: normal effort, no distress Abd: gravid  ROS, labs, PMH reviewed NST reactive   MDM: CBC, CMP, P/C ratio wnl. Reviewed normal labs and normal BP with pt. Reassurance provided. Pt to f/u at [facility]. Continue home BP checks as prescribed but review this at next visit if this is causing anxiety.  Pt stable at time of discharge.  Plan: D/C  home Fetal kick counts reinforced Continue routine follow up at [facility] Return to MAU as needed for emergencies  LEFTWICH-KIRBY, Stepheni Cameron, CNM 4:04 PM

## 2016-08-02 NOTE — Telephone Encounter (Signed)
Patient called stating she has been checking her blood pressures and since Saturday she has had some high ones. She has had a headache on and off in which tylenol does not relieve. She is seeing floaters.   Spoke with Dr Elonda Husky and patient was advised to go to [facility] hospital to be evaluated. Patient verbalized understanding.

## 2016-08-08 ENCOUNTER — Ambulatory Visit (INDEPENDENT_AMBULATORY_CARE_PROVIDER_SITE_OTHER): Payer: BLUE CROSS/BLUE SHIELD | Admitting: Women's Health

## 2016-08-08 ENCOUNTER — Encounter: Payer: Self-pay | Admitting: Women's Health

## 2016-08-08 ENCOUNTER — Other Ambulatory Visit (INDEPENDENT_AMBULATORY_CARE_PROVIDER_SITE_OTHER): Payer: BLUE CROSS/BLUE SHIELD

## 2016-08-08 VITALS — BP 120/86 | HR 72 | Wt 175.0 lb

## 2016-08-08 DIAGNOSIS — O26843 Uterine size-date discrepancy, third trimester: Secondary | ICD-10-CM | POA: Diagnosis not present

## 2016-08-08 DIAGNOSIS — Z3483 Encounter for supervision of other normal pregnancy, third trimester: Secondary | ICD-10-CM

## 2016-08-08 DIAGNOSIS — Z3A31 31 weeks gestation of pregnancy: Secondary | ICD-10-CM

## 2016-08-08 DIAGNOSIS — Z331 Pregnant state, incidental: Secondary | ICD-10-CM

## 2016-08-08 DIAGNOSIS — O360131 Maternal care for anti-D [Rh] antibodies, third trimester, fetus 1: Secondary | ICD-10-CM

## 2016-08-08 DIAGNOSIS — Z6791 Unspecified blood type, Rh negative: Secondary | ICD-10-CM

## 2016-08-08 DIAGNOSIS — Z1389 Encounter for screening for other disorder: Secondary | ICD-10-CM

## 2016-08-08 LAB — POCT URINALYSIS DIPSTICK
Blood, UA: NEGATIVE
Glucose, UA: NEGATIVE
Ketones, UA: NEGATIVE
Leukocytes, UA: NEGATIVE
Nitrite, UA: NEGATIVE
Protein, UA: NEGATIVE

## 2016-08-08 MED ORDER — FERROUS SULFATE 325 (65 FE) MG PO TABS: 325.0000 mg | ORAL_TABLET | Freq: Two times a day (BID) | ORAL | 3 refills | Status: DC

## 2016-08-08 MED ORDER — RHO D IMMUNE GLOBULIN 1500 UNIT/2ML IJ SOSY
300.0000 ug | PREFILLED_SYRINGE | Freq: Once | INTRAMUSCULAR | Status: AC
  Administered 2016-08-08: 300 ug via INTRAMUSCULAR

## 2016-08-08 NOTE — Progress Notes (Signed)
Low-risk OB appointment G2P0101 [redacted]w[redacted]d Estimated Date of Delivery: 10/14/16 BP 120/86   Pulse 72   Wt 175 lb (79.4 kg)   LMP  (LMP Unknown)   BMI 28.25 kg/m   BP, weight, and urine reviewed.  Refer to obstetrical flow sheet for FH & FHR.  Reports good fm.  Denies regular uc's, lof, vb, or uti s/s. No complaints. Still undecided about repeat c/s vs. TOLAC. Wants to think some more about flu shot.  Reviewed pn2 results- hgb 10.9, does feel fatigued and some sob, rx fe bid, increase fe-rich foods. Discussed ptl s/s, fkc. Recommended Tdap at HD/PCP per CDC guidelines. Reviewed pre-e s/s to report. H/O severe pre-e w/ delivery at 29wks. BP normal, absolutely no sx, no proteinuria.  Plan:  Continue routine obstetrical care  F/U today @ 4pm for work-in u/s for afi/efw for s<d, then 2wks for OB appointment  Rhogam today

## 2016-08-08 NOTE — Progress Notes (Signed)
[place] 0000000 wks,cephalic,ant pl gr 1,normal ov's bilat,fhr 156 bpm,afi 10.3 cm,efw 1618 g 53%

## 2016-08-08 NOTE — Patient Instructions (Addendum)
Think about birth control methods Think about cesarean section vs. vaginal birth Think about the flu shot, if you decide you want it, just call  Call the office [phone]) or go to [facility] if:  You begin to have strong, frequent contractions  Your water breaks.  Sometimes it is a big gush of fluid, sometimes it is just a trickle that keeps getting your panties wet or running down your legs  You have vaginal bleeding.  It is normal to have a small amount of spotting if your cervix was checked.   You don't feel your baby moving like normal.  If you don't, get you something to eat and drink and lay down and focus on feeling your baby move.  You should feel at least 10 movements in 2 hours.  If you don't, you should call the office or go to [facility].    Call the office [phone]) or go to [facility] hospital for these signs of pre-eclampsia:  Severe headache that does not go away with Tylenol  Visual changes- seeing spots, double, blurred vision  Pain under your right breast or upper abdomen that does not go away with Tums or heartburn medicine  Nausea and/or vomiting  Severe swelling in your hands, feet, and face      Tdap Vaccine  It is recommended that you get the Tdap vaccine during the third trimester of EACH pregnancy to help protect your baby from getting pertussis (whooping cough)  27-36 weeks is the BEST time to do this so that you can pass the protection on to your baby. During pregnancy is better than after pregnancy, but if you are unable to get it during pregnancy it will be offered at the hospital.   You can get this vaccine at the health department or your family doctor  Everyone who will be around your baby should also be up-to-date on their vaccines. Adults (who are not pregnant) only need 1 dose of Tdap during adulthood.   Third Trimester of Pregnancy The third trimester is from week 29 through week 42, months 7 through 9. The third trimester is a  time when the fetus is growing rapidly. At the end of the ninth month, the fetus is about 20 inches in length and weighs 6-10 pounds.  BODY CHANGES Your body goes through many changes during pregnancy. The changes vary from woman to woman.   Your weight will continue to increase. You can expect to gain 25-35 pounds (11-16 kg) by the end of the pregnancy.  You may begin to get stretch marks on your hips, abdomen, and breasts.  You may urinate more often because the fetus is moving lower into your pelvis and pressing on your bladder.  You may develop or continue to have heartburn as a result of your pregnancy.  You may develop constipation because certain hormones are causing the muscles that push waste through your intestines to slow down.  You may develop hemorrhoids or swollen, bulging veins (varicose veins).  You may have pelvic pain because of the weight gain and pregnancy hormones relaxing your joints between the bones in your pelvis. Backaches may result from overexertion of the muscles supporting your posture.  You may have changes in your hair. These can include thickening of your hair, rapid growth, and changes in texture. Some women also have hair loss during or after pregnancy, or hair that feels dry or thin. Your hair will most likely return to normal after your baby is born.  Your breasts will continue to grow and be tender. A yellow discharge may leak from your breasts called colostrum.  Your belly button may stick out.  You may feel short of breath because of your expanding uterus.  You may notice the fetus "dropping," or moving lower in your abdomen.  You may have a bloody mucus discharge. This usually occurs a few days to a week before labor begins.  Your cervix becomes thin and soft (effaced) near your due date. WHAT TO EXPECT AT YOUR PRENATAL EXAMS  You will have prenatal exams every 2 weeks until week 36. Then, you will have weekly prenatal exams. During a routine  prenatal visit:  You will be weighed to make sure you and the fetus are growing normally.  Your blood pressure is taken.  Your abdomen will be measured to track your baby's growth.  The fetal heartbeat will be listened to.  Any test results from the previous visit will be discussed.  You may have a cervical check near your due date to see if you have effaced. At around 36 weeks, your caregiver will check your cervix. At the same time, your caregiver will also perform a test on the secretions of the vaginal tissue. This test is to determine if a type of bacteria, Group B streptococcus, is present. Your caregiver will explain this further. Your caregiver may ask you:  What your birth plan is.  How you are feeling.  If you are feeling the baby move.  If you have had any abnormal symptoms, such as leaking fluid, bleeding, severe headaches, or abdominal cramping.  If you have any questions. Other tests or screenings that may be performed during your third trimester include:  Blood tests that check for low iron levels (anemia).  Fetal testing to check the health, activity level, and growth of the fetus. Testing is done if you have certain medical conditions or if there are problems during the pregnancy. FALSE LABOR You may feel small, irregular contractions that eventually go away. These are called Braxton Hicks contractions, or false labor. Contractions may last for hours, days, or even weeks before true labor sets in. If contractions come at regular intervals, intensify, or become painful, it is best to be seen by your caregiver.  SIGNS OF LABOR   Menstrual-like cramps.  Contractions that are 5 minutes apart or less.  Contractions that start on the top of the uterus and spread down to the lower abdomen and back.  A sense of increased pelvic pressure or back pain.  A watery or bloody mucus discharge that comes from the vagina. If you have any of these signs before the 37th week  of pregnancy, call your caregiver right away. You need to go to the hospital to get checked immediately. HOME CARE INSTRUCTIONS   Avoid all smoking, herbs, alcohol, and unprescribed drugs. These chemicals affect the formation and growth of the baby.  Follow your caregiver's instructions regarding medicine use. There are medicines that are either safe or unsafe to take during pregnancy.  Exercise only as directed by your caregiver. Experiencing uterine cramps is a good sign to stop exercising.  Continue to eat regular, healthy meals.  Wear a good support bra for breast tenderness.  Do not use hot tubs, steam rooms, or saunas.  Wear your seat belt at all times when driving.  Avoid raw meat, uncooked cheese, cat litter boxes, and soil used by cats. These carry germs that can cause birth defects in the baby.    Take your prenatal vitamins.  Try taking a stool softener (if your caregiver approves) if you develop constipation. Eat more high-fiber foods, such as fresh vegetables or fruit and whole grains. Drink plenty of fluids to keep your urine clear or pale yellow.  Take warm sitz baths to soothe any pain or discomfort caused by hemorrhoids. Use hemorrhoid cream if your caregiver approves.  If you develop varicose veins, wear support hose. Elevate your feet for 15 minutes, 3-4 times a day. Limit salt in your diet.  Avoid heavy lifting, wear low heal shoes, and practice good posture.  Rest a lot with your legs elevated if you have leg cramps or low back pain.  Visit your dentist if you have not gone during your pregnancy. Use a soft toothbrush to brush your teeth and be gentle when you floss.  A sexual relationship may be continued unless your caregiver directs you otherwise.  Do not travel far distances unless it is absolutely necessary and only with the approval of your caregiver.  Take prenatal classes to understand, practice, and ask questions about the labor and delivery.  Make a  trial run to the hospital.  Pack your hospital bag.  Prepare the baby's nursery.  Continue to go to all your prenatal visits as directed by your caregiver. SEEK MEDICAL CARE IF:  You are unsure if you are in labor or if your water has broken.  You have dizziness.  You have mild pelvic cramps, pelvic pressure, or nagging pain in your abdominal area.  You have persistent nausea, vomiting, or diarrhea.  You have a bad smelling vaginal discharge.  You have pain with urination. SEEK IMMEDIATE MEDICAL CARE IF:   You have a fever.  You are leaking fluid from your vagina.  You have spotting or bleeding from your vagina.  You have severe abdominal cramping or pain.  You have rapid weight loss or gain.  You have shortness of breath with chest pain.  You notice sudden or extreme swelling of your face, hands, ankles, feet, or legs.  You have not felt your baby move in over an hour.  You have severe headaches that do not go away with medicine.  You have vision changes. Document Released: 08/22/2001 Document Revised: 09/02/2013 Document Reviewed: 10/29/2012 [facility] Patient Information 2015 [place], [place]. This information is not intended to replace advice given to you by your health care provider. Make sure you discuss any questions you have with your health care provider.   Iron-Rich Diet Introduction Iron is a mineral that helps your body to produce hemoglobin. Hemoglobin is a protein in your red blood cells that carries oxygen to your body's tissues. Eating too little iron may cause you to feel weak and tired, and it can increase your risk for infection. Eating enough iron is necessary for your body's metabolism, muscle function, and nervous system. Iron is naturally found in many foods. It can also be added to foods or fortified in foods. There are two types of dietary iron:  Heme iron. Heme iron is absorbed by the body more easily than nonheme iron. Heme iron is found in  meat, poultry, and fish.  Nonheme iron. Nonheme iron is found in dietary supplements, iron-fortified grains, beans, and vegetables. You may need to follow an iron-rich diet if:  You have been diagnosed with iron deficiency or iron-deficiency anemia.  You have a condition that prevents you from absorbing dietary iron, such as:  Infection in your intestines.  Celiac disease. This involves long-lasting (  chronic) inflammation of your intestines.  You do not eat enough iron.  You eat a diet that is high in foods that impair iron absorption.  You have lost a lot of blood.  You have heavy bleeding during your menstrual cycle.  You are pregnant. What is my plan? Your health care provider may help you to determine how much iron you need per day based on your condition. Generally, when a person consumes sufficient amounts of iron in the diet, the following iron needs are met:  Men.  14-18 years old: 11 mg per day.  19-50 years old: 8 mg per day.  Women.  14-18 years old: 15 mg per day.  19-50 years old: 18 mg per day.  Over 50 years old: 8 mg per day.  Pregnant women: 27 mg per day.  Breastfeeding women: 9 mg per day. What do I need to know about an iron-rich diet?  Eat fresh fruits and vegetables that are high in vitamin C along with foods that are high in iron. This will help increase the amount of iron that your body absorbs from food, especially with foods containing nonheme iron. Foods that are high in vitamin C include oranges, peppers, tomatoes, and mango.  Take iron supplements only as directed by your health care provider. Overdose of iron can be life-threatening. If you were prescribed iron supplements, take them with orange juice or a vitamin C supplement.  Cook foods in pots and pans that are made from iron.  Eat nonheme iron-containing foods alongside foods that are high in heme iron. This helps to improve your iron absorption.  Certain foods and drinks contain  compounds that impair iron absorption. Avoid eating these foods in the same meal as iron-rich foods or with iron supplements. These include:  Coffee, black tea, and red wine.  Milk, dairy products, and foods that are high in calcium.  Beans, soybeans, and peas.  Whole grains.  When eating foods that contain both nonheme iron and compounds that impair iron absorption, follow these tips to absorb iron better.  Soak beans overnight before cooking.  Soak whole grains overnight and drain them before using.  Ferment flours before baking, such as using yeast in bread dough. What foods can I eat? Grains  Iron-fortified breakfast cereal. Iron-fortified whole-wheat bread. Enriched rice. Sprouted grains. Vegetables  Spinach. Potatoes with skin. Green peas. Broccoli. Red and green bell peppers. Fermented vegetables. Fruits  Prunes. Raisins. Oranges. Strawberries. Mango. Grapefruit. Meats and Other Protein Sources  Beef liver. Oysters. Beef. Shrimp. Turkey. Chicken. Tuna. Sardines. Chickpeas. Nuts. Tofu. Beverages  Tomato juice. Fresh orange juice. Prune juice. Hibiscus tea. Fortified instant breakfast shakes. Condiments  Tahini. Fermented soy sauce. Sweets and Desserts  Black-strap molasses. Other  Wheat germ. The items listed above may not be a complete list of recommended foods or beverages. Contact your dietitian for more options.  What foods are not recommended? Grains  Whole grains. Bran cereal. Bran flour. Oats. Vegetables  Artichokes. Brussels sprouts. Kale. Fruits  Blueberries. Raspberries. Strawberries. Figs. Meats and Other Protein Sources  Soybeans. Products made from soy protein. Dairy  Milk. Cream. Cheese. Yogurt. Cottage cheese. Beverages  Coffee. Black tea. Red wine. Sweets and Desserts  Cocoa. Chocolate. Ice cream. Other  Basil. Oregano. Parsley. The items listed above may not be a complete list of foods and beverages to avoid. Contact your dietitian for more  information.  This information is not intended to replace advice given to you by your health care provider.   Make sure you discuss any questions you have with your health care provider. Document Released: 04/11/2005 Document Revised: 03/17/2016 Document Reviewed: 03/25/2014  2017 Elsevier    

## 2016-08-12 ENCOUNTER — Other Ambulatory Visit: Payer: Self-pay | Admitting: Obstetrics and Gynecology

## 2016-08-23 ENCOUNTER — Encounter: Payer: Self-pay | Admitting: Women's Health

## 2016-08-23 ENCOUNTER — Ambulatory Visit (INDEPENDENT_AMBULATORY_CARE_PROVIDER_SITE_OTHER): Payer: BLUE CROSS/BLUE SHIELD | Admitting: Women's Health

## 2016-08-23 VITALS — BP 122/82 | HR 80 | Wt 185.0 lb

## 2016-08-23 DIAGNOSIS — O1213 Gestational proteinuria, third trimester: Secondary | ICD-10-CM | POA: Diagnosis not present

## 2016-08-23 DIAGNOSIS — Z3483 Encounter for supervision of other normal pregnancy, third trimester: Secondary | ICD-10-CM

## 2016-08-23 DIAGNOSIS — Z3A33 33 weeks gestation of pregnancy: Secondary | ICD-10-CM | POA: Diagnosis not present

## 2016-08-23 DIAGNOSIS — Z331 Pregnant state, incidental: Secondary | ICD-10-CM

## 2016-08-23 DIAGNOSIS — Z1389 Encounter for screening for other disorder: Secondary | ICD-10-CM

## 2016-08-23 LAB — POCT URINALYSIS DIPSTICK
Blood, UA: NEGATIVE
Glucose, UA: NEGATIVE
Ketones, UA: NEGATIVE
Leukocytes, UA: NEGATIVE
Nitrite, UA: NEGATIVE

## 2016-08-23 NOTE — Patient Instructions (Addendum)
Check your blood pressure 4 times a day (in the morning, at lunch, supper, and bedtime) Call us if consistently >140 on top or >90 on bottom  Call the office [phone]) or go to [facility] hospital for these signs of pre-eclampsia:  Severe headache that does not go away with Tylenol  Visual changes- seeing spots, double, blurred vision  Pain under your right breast or upper abdomen that does not go away with Tums or heartburn medicine  Nausea and/or vomiting  Severe swelling in your hands, feet, and face    Call the office [phone]) or go to [facility] if:  You begin to have strong, frequent contractions  Your water breaks.  Sometimes it is a big gush of fluid, sometimes it is just a trickle that keeps getting your panties wet or running down your legs  You have vaginal bleeding.  It is normal to have a small amount of spotting if your cervix was checked.   You don't feel your baby moving like normal.  If you don't, get you something to eat and drink and lay down and focus on feeling your baby move.  You should feel at least 10 movements in 2 hours.  If you don't, you should call the office or go to [facility].

## 2016-08-23 NOTE — Progress Notes (Signed)
Low-risk OB appointment G2P0101 [redacted]w[redacted]d Estimated Date of Delivery: 10/14/16 BP 122/82   Pulse 80   Wt 185 lb (83.9 kg)   LMP  (LMP Unknown)   BMI 29.86 kg/m   BP, weight, and urine reviewed.  Refer to obstetrical flow sheet for FH & FHR.  Reports good fm.  Denies regular uc's, lof, vb, or uti s/s. Some braxton hicks, come & go. Felt like something was 'coming out' like the baby the other night, but went away and hasn't felt again.  1+proteinuria today and 10lb wt gain in 2wks- states she hasn't changed her diet. Has h/o severe pre-e w/ delivery @ 29wks. No changes in her normal headaches, denies visual changes, ruq/epigastric pain, n/v. Feels like legs have been swollen.  No edema bilaterally DTRs 2+, no clonus Reviewed ptl s/s, pre-e s/s, fkc. Will get pre-e labs today to be on safe side. Check bp QID at home, call us if consistently sbp>140 or dbp>90 Plan:  Continue routine obstetrical care  F/U in 1wk for OB appointment visit

## 2016-08-24 ENCOUNTER — Telehealth: Payer: Self-pay | Admitting: Women's Health

## 2016-08-24 DIAGNOSIS — O1213 Gestational proteinuria, third trimester: Secondary | ICD-10-CM

## 2016-08-24 LAB — COMPREHENSIVE METABOLIC PANEL
ALT: 10 IU/L (ref 0–32)
AST: 14 IU/L (ref 0–40)
Albumin/Globulin Ratio: 1.1 — ABNORMAL LOW (ref 1.2–2.2)
Albumin: 3.4 g/dL — ABNORMAL LOW (ref 3.5–5.5)
Alkaline Phosphatase: 149 IU/L — ABNORMAL HIGH (ref 39–117)
BUN/Creatinine Ratio: 19 (ref 9–23)
BUN: 11 mg/dL (ref 6–20)
Bilirubin Total: <0.2 mg/dL (ref 0.0–1.2)
CO2: 20 mmol/L (ref 18–29)
Calcium: 8.9 mg/dL (ref 8.7–10.2)
Chloride: 100 mmol/L (ref 96–106)
Creatinine, Ser: 0.58 mg/dL (ref 0.57–1.00)
GFR calc Af Amer: 149 mL/min/{1.73_m2} (ref >59)
GFR calc non Af Amer: 129 mL/min/{1.73_m2} (ref >59)
Globulin, Total: 3.2 g/dL (ref 1.5–4.5)
Glucose: 94 mg/dL (ref 65–99)
Potassium: 4.5 mmol/L (ref 3.5–5.2)
Sodium: 136 mmol/L (ref 134–144)
Total Protein: 6.6 g/dL (ref 6.0–8.5)

## 2016-08-24 LAB — PROTEIN / CREATININE RATIO, URINE
Creatinine, Urine: 214.4 mg/dL
Protein, Ur: 92.4 mg/dL
Protein/Creat Ratio: 431 mg/g creat — ABNORMAL HIGH (ref 0–200)

## 2016-08-24 LAB — CBC
Hematocrit: 32.5 % — ABNORMAL LOW (ref 34.0–46.6)
Hemoglobin: 10.8 g/dL — ABNORMAL LOW (ref 11.1–15.9)
MCH: 28.2 pg (ref 26.6–33.0)
MCHC: 33.2 g/dL (ref 31.5–35.7)
MCV: 85 fL (ref 79–97)
Platelets: 194 10*3/uL (ref 150–379)
RBC: 3.83 x10E6/uL (ref 3.77–5.28)
RDW: 14 % (ref 12.3–15.4)
WBC: 11.7 10*3/uL — ABNORMAL HIGH (ref 3.4–10.8)

## 2016-08-24 NOTE — Telephone Encounter (Signed)
Notified pt of normal pre-e labs except p:c elevated, order placed for 24hr urine, to come in tomorrow to get supplies. Come in Monday for visit, cancel Wed visit, bring 24hr urine to appt. Home bp's 130s-140s/80s-90s. Denies ha, visual changes, ruq/epigastric pain, n/v.  Discussed w/ JVF, who agrees w/ plan.  Roma Schanz, CNM, [facility] 08/24/2016 5:11 PM

## 2016-08-25 ENCOUNTER — Encounter (HOSPITAL_COMMUNITY): Payer: Self-pay

## 2016-08-25 ENCOUNTER — Inpatient Hospital Stay (HOSPITAL_COMMUNITY)
Admission: AD | Admit: 2016-08-25 | Discharge: 2016-08-26 | Disposition: A | Discharge status: Home / Self Care | Payer: BLUE CROSS/BLUE SHIELD | Source: Ambulatory Visit | Attending: Obstetrics and Gynecology | Admitting: Obstetrics and Gynecology

## 2016-08-25 DIAGNOSIS — Z79899 Other long term (current) drug therapy: Secondary | ICD-10-CM | POA: Diagnosis not present

## 2016-08-25 DIAGNOSIS — Z7982 Long term (current) use of aspirin: Secondary | ICD-10-CM | POA: Diagnosis not present

## 2016-08-25 DIAGNOSIS — I10 Essential (primary) hypertension: Secondary | ICD-10-CM | POA: Diagnosis present

## 2016-08-25 DIAGNOSIS — O1413 Severe pre-eclampsia, third trimester: Secondary | ICD-10-CM | POA: Diagnosis not present

## 2016-08-25 DIAGNOSIS — Z3A33 33 weeks gestation of pregnancy: Secondary | ICD-10-CM | POA: Insufficient documentation

## 2016-08-25 DIAGNOSIS — Z882 Allergy status to sulfonamides status: Secondary | ICD-10-CM | POA: Diagnosis not present

## 2016-08-25 DIAGNOSIS — O99343 Other mental disorders complicating pregnancy, third trimester: Secondary | ICD-10-CM | POA: Insufficient documentation

## 2016-08-25 DIAGNOSIS — F410 Panic disorder [episodic paroxysmal anxiety] without agoraphobia: Secondary | ICD-10-CM | POA: Diagnosis not present

## 2016-08-25 DIAGNOSIS — Z818 Family history of other mental and behavioral disorders: Secondary | ICD-10-CM | POA: Diagnosis not present

## 2016-08-25 DIAGNOSIS — O1410 Severe pre-eclampsia, unspecified trimester: Secondary | ICD-10-CM

## 2016-08-25 LAB — CBC
HCT: 30 % — ABNORMAL LOW (ref 36.0–46.0)
Hemoglobin: 10.1 g/dL — ABNORMAL LOW (ref 12.0–15.0)
MCH: 28 pg (ref 26.0–34.0)
MCHC: 33.7 g/dL (ref 30.0–36.0)
MCV: 83.1 fL (ref 78.0–100.0)
Platelets: 191 10*3/uL (ref 150–400)
RBC: 3.61 MIL/uL — ABNORMAL LOW (ref 3.87–5.11)
RDW: 13.7 % (ref 11.5–15.5)
WBC: 12.5 10*3/uL — ABNORMAL HIGH (ref 4.0–10.5)

## 2016-08-25 LAB — COMPREHENSIVE METABOLIC PANEL
ALT: 10 U/L — ABNORMAL LOW (ref 14–54)
AST: 16 U/L (ref 15–41)
Albumin: 2.7 g/dL — ABNORMAL LOW (ref 3.5–5.0)
Alkaline Phosphatase: 134 U/L — ABNORMAL HIGH (ref 38–126)
Anion gap: 11 (ref 5–15)
BUN: 10 mg/dL (ref 6–20)
CO2: 20 mmol/L — ABNORMAL LOW (ref 22–32)
Calcium: 8.7 mg/dL — ABNORMAL LOW (ref 8.9–10.3)
Chloride: 102 mmol/L (ref 101–111)
Creatinine, Ser: 0.65 mg/dL (ref 0.44–1.00)
GFR calc Af Amer: >60 mL/min (ref >60)
GFR calc non Af Amer: >60 mL/min (ref >60)
Glucose, Bld: 94 mg/dL (ref 65–99)
Potassium: 3.8 mmol/L (ref 3.5–5.1)
Sodium: 133 mmol/L — ABNORMAL LOW (ref 135–145)
Total Bilirubin: 0.3 mg/dL (ref 0.3–1.2)
Total Protein: 7 g/dL (ref 6.5–8.1)

## 2016-08-25 LAB — URINALYSIS, ROUTINE W REFLEX MICROSCOPIC
Bilirubin Urine: NEGATIVE
Glucose, UA: NEGATIVE mg/dL
Hgb urine dipstick: NEGATIVE
Ketones, ur: NEGATIVE mg/dL
Leukocytes, UA: NEGATIVE
Nitrite: NEGATIVE
Protein, ur: 100 mg/dL — AB
Specific Gravity, Urine: 1.014 (ref 1.005–1.030)
pH: 6 (ref 5.0–8.0)

## 2016-08-25 LAB — PROTEIN / CREATININE RATIO, URINE
Creatinine, Urine: 105 mg/dL
Protein Creatinine Ratio: 0.81 mg/mg{creat} — ABNORMAL HIGH (ref 0.00–0.15)
Total Protein, Urine: 85 mg/dL

## 2016-08-25 MED ORDER — BETAMETHASONE SOD PHOS & ACET 6 (3-3) MG/ML IJ SUSP
12.0000 mg | Freq: Once | INTRAMUSCULAR | Status: AC
  Administered 2016-08-26: 12 mg via INTRAMUSCULAR
  Filled 2016-08-25: qty 2

## 2016-08-25 MED ORDER — ACETAMINOPHEN 500 MG PO TABS
1000.0000 mg | ORAL_TABLET | Freq: Once | ORAL | Status: AC
  Administered 2016-08-25: 1000 mg via ORAL
  Filled 2016-08-25: qty 2

## 2016-08-25 NOTE — H&P (Signed)
Kathy Zimmerman is a 25 y.o. female G16P0101 @ 32.6wks presenting for eval of frontal H/A and elevated home BPs tonight. She reports slight RUQ pain; last took Tylenol at 1600. No visual disturbances. Her preg has been followed by [facility] and has been remarkable for 1) prev pre-e requiring PTD @ 29wks 2) C/S for breech 3) anxiety (Zoloft) 4) Rh neg   OB History    Gravida Para Term Preterm AB Living   2 1   1   1    SAB TAB Ectopic Multiple Live Births         0 1     Past Medical History:  Diagnosis Date  . Anxiety   . Asthma   . IBS (irritable bowel syndrome)   . Panic attack   . Preeclampsia    Past Surgical History:  Procedure Laterality Date  . CESAREAN SECTION N/A 04/10/2015   Procedure: CESAREAN SECTION;  Surgeon: Crawford Givens, MD;  Location: [facility];  Service: Obstetrics;  Laterality: N/A;  . EYE SURGERY     laser  . SCLERAL BUCKLE  08/09/2011   Procedure: SCLERAL BUCKLE;  Surgeon: Clent Demark Rankin;  Location: [facility];  Service: Ophthalmology;  Laterality: Left;  with cryo  . TENDON REPAIR     Family History: family history includes CAD in her maternal grandfather and other; Depression in her father, maternal grandmother, and sister; Heart disease in her maternal grandfather; Hyperlipidemia in her maternal grandfather; Hypertension in her maternal grandfather; Mental illness in her maternal grandmother and sister; Thyroid disease in her maternal grandmother and other. Social History:  reports that she has never smoked. She has never used smokeless tobacco. She reports that she does not drink alcohol or use drugs.     Maternal Diabetes: No Genetic Screening: Declined Maternal Ultrasounds/Referrals: Normal Fetal Ultrasounds or other Referrals:  None Maternal Substance Abuse:  No Significant Maternal Medications:  Meds include: Zoloft Significant Maternal Lab Results:  None Other Comments:  None  ROS History  BPs: 155/89, 147/96, 146/99, 156/98, 162/105 Blood pressure  146/99, pulse 70, temperature 97.7 F (36.5 C), resp. rate 18, height 5\' 6"  (1.676 m), weight 84.5 kg (186 lb 3.2 oz), unknown if currently breastfeeding. Exam Physical Exam  Constitutional: She appears well-developed.  HENT:  Head: Normocephalic.  Neck: Normal range of motion.  Cardiovascular: Normal rate.   Respiratory: Effort normal.  GI:  FHR 135-145, +accels no decels No ctx per toco  Musculoskeletal: Normal range of motion. She exhibits no edema.  Neurological:  DTRs 2-3+, no clonus  Skin: Skin is warm and dry.  Psychiatric: She has a normal mood and affect. Her behavior is normal. Thought content normal.    CBC    Component Value Date/Time   WBC 12.5 (H) 08/25/2016 2209   RBC 3.61 (L) 08/25/2016 2209   HGB 10.1 (L) 08/25/2016 2209   HCT 30.0 (L) 08/25/2016 2209   HCT 32.5 (L) 08/23/2016 1503   PLT 191 08/25/2016 2209   PLT 194 08/23/2016 1503   MCV 83.1 08/25/2016 2209   MCV 85 08/23/2016 1503   MCH 28.0 08/25/2016 2209   MCHC 33.7 08/25/2016 2209   RDW 13.7 08/25/2016 2209   RDW 14.0 08/23/2016 1503   LYMPHSABS 2.8 05/22/2015 0200   MONOABS 0.9 05/22/2015 0200   EOSABS 0.3 05/22/2015 0200   BASOSABS 0.0 05/22/2015 0200   CMP     Component Value Date/Time   NA 133 (L) 08/25/2016 2209   NA 136 08/23/2016  1503   K 3.8 08/25/2016 2209   CL 102 08/25/2016 2209   CO2 20 (L) 08/25/2016 2209   GLUCOSE 94 08/25/2016 2209   BUN 10 08/25/2016 2209   BUN 11 08/23/2016 1503   CREATININE 0.65 08/25/2016 2209   CALCIUM 8.7 (L) 08/25/2016 2209   PROT 7.0 08/25/2016 2209   PROT 6.6 08/23/2016 1503   ALBUMIN 2.7 (L) 08/25/2016 2209   ALBUMIN 3.4 (L) 08/23/2016 1503   AST 16 08/25/2016 2209   ALT 10 (L) 08/25/2016 2209   ALKPHOS 134 (H) 08/25/2016 2209   BILITOT 0.3 08/25/2016 2209   BILITOT <0.2 08/23/2016 1503   GFRNONAA >60 08/25/2016 2209   GFRAA >60 08/25/2016 2209   Urine P/C: 0.81  Prenatal labs: ABO, Rh:   Antibody: Negative (11/07 0909) Rubella:  3.05 (07/10 1557) RPR: Non Reactive (11/07 0909)  HBsAg: Negative (07/10 1557)  HIV: Non Reactive (11/07 0909)  GBS:     Assessment/Plan: IUP@32 .6wks  Severe pre-e Prev C/S  Admit to Antenatal [facility] Dr Glo Herring to eval for Mag  Serita Grammes CNM 08/25/2016, 10:13 PM

## 2016-08-25 NOTE — MAU Note (Signed)
Hx preeclampsia with first pregnancy. Has hx headaches. Has had h/a since yesterday some pressure in ears. Some pain RUQ since 1800 tonight that is not bad. Good FM. Denies LOF or bleeding. Had some protein in urine Weds and blood work was ok.

## 2016-08-26 DIAGNOSIS — Z3A33 33 weeks gestation of pregnancy: Secondary | ICD-10-CM | POA: Diagnosis not present

## 2016-08-26 DIAGNOSIS — Z882 Allergy status to sulfonamides status: Secondary | ICD-10-CM | POA: Diagnosis not present

## 2016-08-26 DIAGNOSIS — F410 Panic disorder [episodic paroxysmal anxiety] without agoraphobia: Secondary | ICD-10-CM | POA: Diagnosis not present

## 2016-08-26 DIAGNOSIS — Z79899 Other long term (current) drug therapy: Secondary | ICD-10-CM | POA: Diagnosis not present

## 2016-08-26 DIAGNOSIS — Z818 Family history of other mental and behavioral disorders: Secondary | ICD-10-CM | POA: Diagnosis not present

## 2016-08-26 DIAGNOSIS — O1413 Severe pre-eclampsia, third trimester: Secondary | ICD-10-CM | POA: Diagnosis not present

## 2016-08-26 DIAGNOSIS — O99343 Other mental disorders complicating pregnancy, third trimester: Secondary | ICD-10-CM | POA: Diagnosis not present

## 2016-08-26 DIAGNOSIS — I10 Essential (primary) hypertension: Secondary | ICD-10-CM | POA: Diagnosis present

## 2016-08-26 DIAGNOSIS — Z7982 Long term (current) use of aspirin: Secondary | ICD-10-CM | POA: Diagnosis not present

## 2016-08-26 MED ORDER — MAGNESIUM SULFATE 50 % IJ SOLN
2.0000 g/h | INTRAVENOUS | Status: DC
  Administered 2016-08-26: 2 g/h via INTRAVENOUS
  Filled 2016-08-26: qty 80

## 2016-08-26 MED ORDER — LACTATED RINGERS IV SOLN
INTRAVENOUS | Status: DC
  Administered 2016-08-25: via INTRAVENOUS

## 2016-08-26 MED ORDER — MAGNESIUM SULFATE BOLUS VIA INFUSION
4.0000 g | Freq: Once | INTRAVENOUS | Status: AC
  Administered 2016-08-26: 4 g via INTRAVENOUS
  Filled 2016-08-26: qty 500

## 2016-08-26 NOTE — MAU Provider Note (Signed)
History     CSN: [id number]  Arrival date and time: 08/25/16 2124   First Provider Initiated Contact with Patient 08/25/16 2309      Chief Complaint  Patient presents with  . Hypertension  . Headache   HPI this 25 y.o.G2P0101 at [redacted]w[redacted]d is seen at MAU for headache and elevated pressures, in 160's /105 initially now 150's , with workup revealing normal platetlets (191K), lft's' (ast /alt 16/10),and urine protein 100 mg/dl, with Pr/Cr ratio 0.81 tonight.  Prenatal course has been notable for progressive pressure increasing over the last week, seen 2 days ago with Pr./Cr of 0.431, and 1+ protein, with normal pressures 122/82 at that time.  Pt has prior Ob hx of severe Pre-E at 29 wk 6 d requiring delivery , cesarean section low transverse required due to breech presentation. Pt would consider TOLAC , " whatever you think is best".      Past Medical History:  Diagnosis Date  . Anxiety   . Asthma   . IBS (irritable bowel syndrome)   . Panic attack   . Preeclampsia   pt with hx of panic attacks, when not pregnant she is on xanax.  Past Surgical History:  Procedure Laterality Date  . CESAREAN SECTION N/A 04/10/2015   Procedure: CESAREAN SECTION;  Surgeon: Crawford Givens, MD;  Location: [facility];  Service: Obstetrics;  Laterality: N/A;  . EYE SURGERY     laser  . SCLERAL BUCKLE  08/09/2011   Procedure: SCLERAL BUCKLE;  Surgeon: Clent Demark Rankin;  Location: [facility];  Service: Ophthalmology;  Laterality: Left;  with cryo  . TENDON REPAIR      Family History  Problem Relation Age of Onset  . Depression Father   . Depression Sister   . Mental illness Sister   . Depression Maternal Grandmother   . Mental illness Maternal Grandmother   . Thyroid disease Maternal Grandmother   . Heart disease Maternal Grandfather   . Hypertension Maternal Grandfather   . Hyperlipidemia Maternal Grandfather   . CAD Maternal Grandfather   . Thyroid disease Other   . CAD Other     Social History   Substance Use Topics  . Smoking status: Never Smoker  . Smokeless tobacco: Never Used  . Alcohol use No    Allergies:  Allergies  Allergen Reactions  . Sulfa Antibiotics Other (See Comments)    Reaction:  Unknown     Prescriptions Prior to Admission  Medication Sig Dispense Refill Last Dose  . acetaminophen (TYLENOL) 500 MG tablet Take 500 mg by mouth every 6 (six) hours as needed.   08/25/2016 at 1600  . aspirin EC 81 MG tablet Take 81 mg by mouth every evening.    Past Week at Unknown time  . ferrous sulfate 325 (65 FE) MG tablet Take 1 tablet (325 mg total) by mouth 2 (two) times daily with a meal. 60 tablet 3 Past Week at Unknown time  . ondansetron (ZOFRAN) 8 MG tablet Take 1 tablet (8 mg total) by mouth every 8 (eight) hours as needed for nausea or vomiting. 30 tablet 6 08/25/2016 at Unknown time  . Prenatal Vit-Fe Fumarate-FA (PRENATAL MULTIVITAMIN) TABS tablet Take 1 tablet by mouth daily.    08/24/2016 at Unknown time  . promethazine (PHENERGAN) 25 MG tablet TAKE 1 TABLET (25 MG TOTAL) BY MOUTH EVERY 6 (SIX) HOURS AS NEEDED FOR NAUSEA OR VOMITING. 30 tablet 3 08/25/2016 at Unknown time  . sertraline (ZOLOFT) 50 MG tablet Take 50 mg  by mouth daily.    08/24/2016 at Unknown time  . promethazine (PHENERGAN) 25 MG tablet Take 25 mg by mouth every 6 (six) hours as needed for nausea or vomiting.   Not Taking    ROS Physical Exam   Blood pressure 148/95, pulse 70, temperature 97.7 F (36.5 C), resp. rate 16, height 5\' 6"  (1.676 m), weight 84.5 kg (186 lb 3.2 oz), unknown if currently breastfeeding.  Physical Exam  Constitutional: She appears well-developed and well-nourished.  HENT:  Head: Normocephalic.  Eyes: Pupils are equal, round, and reactive to light.  Cardiovascular: Normal rate.   Respiratory: Effort normal.  GI: Soft.  Gravid uterus c/w dates.   CBC    Component Value Date/Time   WBC 12.5 (H) 08/25/2016 2209   RBC 3.61 (L) 08/25/2016 2209   HGB 10.1 (L)  08/25/2016 2209   HCT 30.0 (L) 08/25/2016 2209   HCT 32.5 (L) 08/23/2016 1503   PLT 191 08/25/2016 2209   PLT 194 08/23/2016 1503   MCV 83.1 08/25/2016 2209   MCV 85 08/23/2016 1503   MCH 28.0 08/25/2016 2209   MCHC 33.7 08/25/2016 2209   RDW 13.7 08/25/2016 2209   RDW 14.0 08/23/2016 1503   LYMPHSABS 2.8 05/22/2015 0200   MONOABS 0.9 05/22/2015 0200   EOSABS 0.3 05/22/2015 0200   BASOSABS 0.0 05/22/2015 0200    CMP Latest Ref Rng & Units 08/25/2016 08/23/2016 08/02/2016  Glucose 65 - 99 mg/dL 94 94 128(H)  BUN 6 - 20 mg/dL 10 11 7   Creatinine 0.44 - 1.00 mg/dL 0.65 0.58 0.55  Sodium 135 - 145 mmol/L 133(L) 136 135  Potassium 3.5 - 5.1 mmol/L 3.8 4.5 3.4(L)  Chloride 101 - 111 mmol/L 102 100 103  CO2 22 - 32 mmol/L 20(L) 20 22  Calcium 8.9 - 10.3 mg/dL 8.7(L) 8.9 9.2  Total Protein 6.5 - 8.1 g/dL 7.0 6.6 7.2  Total Bilirubin 0.3 - 1.2 mg/dL 0.3 <0.2 0.2(L)  Alkaline Phos 38 - 126 U/L 134(H) 149(H) 89  AST 15 - 41 U/L 16 14 17   ALT 14 - 54 U/L 10(L) 10 12(L)     MAU Course  Procedures  MDM Assessment, initiation of magnesium and Betamethasone, transfer arrangements made after discussion with NICU determines that NICU is full and not accepting admissions except unavoidable emergent ones.  Assessment and Plan  Pregnancy 33.0 wk Severe Pre-E, with headache , pressures 160/105, and hyperreflexia  Plan 1 begun on Betamethasone 2 begun on Magnesium Sulfate 4g load/ 2g continuous 3. Transfer accepted by Dr Vena Rua at [facility]. 4 [facility] contact made. Velina Drollinger V 08/26/2016, 12:04 AM

## 2016-08-28 ENCOUNTER — Encounter: Payer: BLUE CROSS/BLUE SHIELD | Admitting: Women's Health

## 2016-08-29 MED FILL — Ondansetron HCl Inj 4 MG/2ML (2 MG/ML): INTRAMUSCULAR | Qty: 2 | Status: AC

## 2016-08-30 ENCOUNTER — Encounter: Payer: BLUE CROSS/BLUE SHIELD | Admitting: Obstetrics and Gynecology

## 2016-09-06 ENCOUNTER — Encounter: Payer: BLUE CROSS/BLUE SHIELD | Admitting: Obstetrics and Gynecology

## 2016-09-07 DIAGNOSIS — R519 Headache, unspecified: Secondary | ICD-10-CM | POA: Insufficient documentation

## 2016-09-07 DIAGNOSIS — R51 Headache: Secondary | ICD-10-CM

## 2016-09-11 ENCOUNTER — Encounter (HOSPITAL_COMMUNITY): Payer: Self-pay | Admitting: Emergency Medicine

## 2016-09-11 ENCOUNTER — Emergency Department (HOSPITAL_COMMUNITY): Payer: BLUE CROSS/BLUE SHIELD

## 2016-09-11 ENCOUNTER — Emergency Department (HOSPITAL_COMMUNITY)
Admission: EM | Admit: 2016-09-11 | Discharge: 2016-09-11 | Disposition: A | Discharge status: Home / Self Care | Payer: BLUE CROSS/BLUE SHIELD | Attending: Emergency Medicine | Admitting: Emergency Medicine

## 2016-09-11 DIAGNOSIS — J45909 Unspecified asthma, uncomplicated: Secondary | ICD-10-CM | POA: Diagnosis not present

## 2016-09-11 DIAGNOSIS — G90A Postural orthostatic tachycardia syndrome (POTS): Secondary | ICD-10-CM | POA: Insufficient documentation

## 2016-09-11 DIAGNOSIS — R1011 Right upper quadrant pain: Secondary | ICD-10-CM | POA: Diagnosis not present

## 2016-09-11 DIAGNOSIS — I10 Essential (primary) hypertension: Secondary | ICD-10-CM | POA: Diagnosis not present

## 2016-09-11 DIAGNOSIS — R079 Chest pain, unspecified: Secondary | ICD-10-CM | POA: Insufficient documentation

## 2016-09-11 DIAGNOSIS — R531 Weakness: Secondary | ICD-10-CM | POA: Diagnosis present

## 2016-09-11 DIAGNOSIS — I498 Other specified cardiac arrhythmias: Secondary | ICD-10-CM | POA: Insufficient documentation

## 2016-09-11 DIAGNOSIS — Z79899 Other long term (current) drug therapy: Secondary | ICD-10-CM | POA: Diagnosis not present

## 2016-09-11 LAB — CBC WITH DIFFERENTIAL/PLATELET
Basophils Absolute: 0.1 10*3/uL (ref 0.0–0.1)
Basophils Relative: 1 %
Eosinophils Absolute: 0.1 10*3/uL (ref 0.0–0.7)
Eosinophils Relative: 1 %
HCT: 33.7 % — ABNORMAL LOW (ref 36.0–46.0)
Hemoglobin: 11 g/dL — ABNORMAL LOW (ref 12.0–15.0)
Lymphocytes Relative: 28 %
Lymphs Abs: 2.9 10*3/uL (ref 0.7–4.0)
MCH: 28.5 pg (ref 26.0–34.0)
MCHC: 32.6 g/dL (ref 30.0–36.0)
MCV: 87.3 fL (ref 78.0–100.0)
Monocytes Absolute: 0.5 10*3/uL (ref 0.1–1.0)
Monocytes Relative: 4 %
Neutro Abs: 7.1 10*3/uL (ref 1.7–7.7)
Neutrophils Relative %: 66 %
Platelets: 384 10*3/uL (ref 150–400)
RBC: 3.86 MIL/uL — ABNORMAL LOW (ref 3.87–5.11)
RDW: 14.9 % (ref 11.5–15.5)
WBC: 10.7 10*3/uL — ABNORMAL HIGH (ref 4.0–10.5)

## 2016-09-11 LAB — COMPREHENSIVE METABOLIC PANEL
ALT: 19 U/L (ref 14–54)
AST: 15 U/L (ref 15–41)
Albumin: 3.8 g/dL (ref 3.5–5.0)
Alkaline Phosphatase: 95 U/L (ref 38–126)
Anion gap: 10 (ref 5–15)
BUN: 14 mg/dL (ref 6–20)
CO2: 23 mmol/L (ref 22–32)
Calcium: 9.4 mg/dL (ref 8.9–10.3)
Chloride: 103 mmol/L (ref 101–111)
Creatinine, Ser: 0.63 mg/dL (ref 0.44–1.00)
GFR calc Af Amer: >60 mL/min (ref >60)
GFR calc non Af Amer: >60 mL/min (ref >60)
Glucose, Bld: 90 mg/dL (ref 65–99)
Potassium: 3.5 mmol/L (ref 3.5–5.1)
Sodium: 136 mmol/L (ref 135–145)
Total Bilirubin: 0.3 mg/dL (ref 0.3–1.2)
Total Protein: 8.6 g/dL — ABNORMAL HIGH (ref 6.5–8.1)

## 2016-09-11 LAB — PROTEIN / CREATININE RATIO, URINE
Creatinine, Urine: 35.64 mg/dL
Total Protein, Urine: <6 mg/dL

## 2016-09-11 LAB — URINALYSIS, ROUTINE W REFLEX MICROSCOPIC
Bilirubin Urine: NEGATIVE
Glucose, UA: NEGATIVE mg/dL
Hgb urine dipstick: NEGATIVE
Ketones, ur: NEGATIVE mg/dL
Leukocytes, UA: NEGATIVE
Nitrite: NEGATIVE
Protein, ur: NEGATIVE mg/dL
Specific Gravity, Urine: 1.01 (ref 1.005–1.030)
pH: 6 (ref 5.0–8.0)

## 2016-09-11 LAB — D-DIMER, QUANTITATIVE: D-Dimer, Quant: 2.45 ug/mL-FEU — ABNORMAL HIGH (ref 0.00–0.50)

## 2016-09-11 MED ORDER — ALPRAZOLAM 0.5 MG PO TABS
0.5000 mg | ORAL_TABLET | Freq: Once | ORAL | Status: AC
  Administered 2016-09-11: 0.5 mg via ORAL
  Filled 2016-09-11: qty 1

## 2016-09-11 MED ORDER — IOPAMIDOL (ISOVUE-370) INJECTION 76%
100.0000 mL | Freq: Once | INTRAVENOUS | Status: AC | PRN
  Administered 2016-09-11: 100 mL via INTRAVENOUS

## 2016-09-11 MED ORDER — NIFEDIPINE ER OSMOTIC RELEASE 30 MG PO TB24
60.0000 mg | ORAL_TABLET | Freq: Once | ORAL | Status: AC
  Administered 2016-09-11: 60 mg via ORAL
  Filled 2016-09-11: qty 2

## 2016-09-11 MED ORDER — NIFEDIPINE 10 MG PO CAPS: 60.0000 mg | ORAL_CAPSULE | Freq: Once | ORAL | Status: DC

## 2016-09-11 NOTE — ED Triage Notes (Signed)
Pt reports pain on both flanks, pain in back, and pain in chest with inhalation.  States she had a c section on 12/19 and was concerned possible complication.

## 2016-09-11 NOTE — Discharge Instructions (Signed)
Follow up with your gyn md this week.

## 2016-09-13 ENCOUNTER — Emergency Department (HOSPITAL_COMMUNITY): Payer: BLUE CROSS/BLUE SHIELD

## 2016-09-13 ENCOUNTER — Encounter (HOSPITAL_COMMUNITY): Payer: Self-pay | Admitting: *Deleted

## 2016-09-13 ENCOUNTER — Emergency Department (HOSPITAL_COMMUNITY)
Admission: EM | Admit: 2016-09-13 | Discharge: 2016-09-13 | Disposition: A | Discharge status: Home / Self Care | Payer: BLUE CROSS/BLUE SHIELD | Attending: Emergency Medicine | Admitting: Emergency Medicine

## 2016-09-13 DIAGNOSIS — R030 Elevated blood-pressure reading, without diagnosis of hypertension: Secondary | ICD-10-CM | POA: Insufficient documentation

## 2016-09-13 DIAGNOSIS — F419 Anxiety disorder, unspecified: Secondary | ICD-10-CM | POA: Diagnosis not present

## 2016-09-13 DIAGNOSIS — O99345 Other mental disorders complicating the puerperium: Secondary | ICD-10-CM | POA: Insufficient documentation

## 2016-09-13 DIAGNOSIS — J45909 Unspecified asthma, uncomplicated: Secondary | ICD-10-CM | POA: Insufficient documentation

## 2016-09-13 DIAGNOSIS — R1084 Generalized abdominal pain: Secondary | ICD-10-CM | POA: Diagnosis not present

## 2016-09-13 DIAGNOSIS — R109 Unspecified abdominal pain: Secondary | ICD-10-CM

## 2016-09-13 LAB — CBC WITH DIFFERENTIAL/PLATELET
Basophils Absolute: 0 10*3/uL (ref 0.0–0.1)
Basophils Relative: 0 %
Eosinophils Absolute: 0.1 10*3/uL (ref 0.0–0.7)
Eosinophils Relative: 1 %
HCT: 34.7 % — ABNORMAL LOW (ref 36.0–46.0)
Hemoglobin: 11.2 g/dL — ABNORMAL LOW (ref 12.0–15.0)
Lymphocytes Relative: 22 %
Lymphs Abs: 2.1 10*3/uL (ref 0.7–4.0)
MCH: 27.6 pg (ref 26.0–34.0)
MCHC: 32.3 g/dL (ref 30.0–36.0)
MCV: 85.5 fL (ref 78.0–100.0)
Monocytes Absolute: 0.7 10*3/uL (ref 0.1–1.0)
Monocytes Relative: 7 %
Neutro Abs: 6.8 10*3/uL (ref 1.7–7.7)
Neutrophils Relative %: 70 %
Platelets: 372 10*3/uL (ref 150–400)
RBC: 4.06 MIL/uL (ref 3.87–5.11)
RDW: 14.7 % (ref 11.5–15.5)
WBC: 9.7 10*3/uL (ref 4.0–10.5)

## 2016-09-13 LAB — COMPREHENSIVE METABOLIC PANEL
ALT: 16 U/L (ref 14–54)
AST: 15 U/L (ref 15–41)
Albumin: 4 g/dL (ref 3.5–5.0)
Alkaline Phosphatase: 93 U/L (ref 38–126)
Anion gap: 9 (ref 5–15)
BUN: 17 mg/dL (ref 6–20)
CO2: 21 mmol/L — ABNORMAL LOW (ref 22–32)
Calcium: 9.3 mg/dL (ref 8.9–10.3)
Chloride: 106 mmol/L (ref 101–111)
Creatinine, Ser: 0.53 mg/dL (ref 0.44–1.00)
GFR calc Af Amer: >60 mL/min (ref >60)
GFR calc non Af Amer: >60 mL/min (ref >60)
Glucose, Bld: 92 mg/dL (ref 65–99)
Potassium: 4 mmol/L (ref 3.5–5.1)
Sodium: 136 mmol/L (ref 135–145)
Total Bilirubin: 0.2 mg/dL — ABNORMAL LOW (ref 0.3–1.2)
Total Protein: 8.7 g/dL — ABNORMAL HIGH (ref 6.5–8.1)

## 2016-09-13 LAB — PROTEIN / CREATININE RATIO, URINE
Creatinine, Urine: 30.29 mg/dL
Total Protein, Urine: <6 mg/dL

## 2016-09-13 LAB — URINALYSIS, ROUTINE W REFLEX MICROSCOPIC
Bilirubin Urine: NEGATIVE
Bilirubin Urine: NEGATIVE
Glucose, UA: NEGATIVE mg/dL
Glucose, UA: NEGATIVE mg/dL
Hgb urine dipstick: NEGATIVE
Ketones, ur: NEGATIVE mg/dL
Ketones, ur: NEGATIVE mg/dL
Leukocytes, UA: NEGATIVE
Nitrite: NEGATIVE
Nitrite: NEGATIVE
Protein, ur: NEGATIVE mg/dL
Protein, ur: NEGATIVE mg/dL
Specific Gravity, Urine: 1.017 (ref 1.005–1.030)
Specific Gravity, Urine: 1.01 (ref 1.005–1.030)
pH: 6 (ref 5.0–8.0)
pH: 5 (ref 5.0–8.0)

## 2016-09-13 LAB — POC URINE PREG, ED: Preg Test, Ur: NEGATIVE

## 2016-09-13 LAB — LIPASE, BLOOD: Lipase: 21 U/L (ref 11–51)

## 2016-09-13 MED ORDER — ACETAMINOPHEN 500 MG PO TABS
1000.0000 mg | ORAL_TABLET | Freq: Once | ORAL | Status: AC
  Administered 2016-09-13: 1000 mg via ORAL
  Filled 2016-09-13: qty 2

## 2016-09-13 MED ORDER — ALPRAZOLAM 0.5 MG PO TABS
0.5000 mg | ORAL_TABLET | Freq: Three times a day (TID) | ORAL | Status: DC | PRN
  Administered 2016-09-13: 0.5 mg via ORAL
  Filled 2016-09-13: qty 1

## 2016-09-13 MED ORDER — HYDROCHLOROTHIAZIDE 12.5 MG PO CAPS
25.0000 mg | ORAL_CAPSULE | Freq: Once | ORAL | Status: AC
  Administered 2016-09-13: 25 mg via ORAL
  Filled 2016-09-13: qty 2

## 2016-09-13 MED ORDER — LORAZEPAM 2 MG/ML IJ SOLN
0.5000 mg | Freq: Once | INTRAMUSCULAR | Status: AC
  Administered 2016-09-13: 0.5 mg via INTRAVENOUS
  Filled 2016-09-13: qty 1

## 2016-09-13 MED ORDER — LIDOCAINE 4 % EX CREA: 1.0000 "application " | TOPICAL_CREAM | Freq: Three times a day (TID) | CUTANEOUS | 0 refills | Status: DC | PRN

## 2016-09-13 MED ORDER — MORPHINE SULFATE (PF) 4 MG/ML IV SOLN
4.0000 mg | Freq: Once | INTRAVENOUS | Status: AC
  Administered 2016-09-13: 4 mg via INTRAVENOUS
  Filled 2016-09-13: qty 1

## 2016-09-13 MED ORDER — NIFEDIPINE ER 60 MG PO TB24
60.0000 mg | ORAL_TABLET | Freq: Two times a day (BID) | ORAL | Status: DC
  Administered 2016-09-13: 60 mg via ORAL
  Filled 2016-09-13: qty 1

## 2016-09-13 MED ORDER — LIDOCAINE 5 % EX PTCH
1.0000 | MEDICATED_PATCH | CUTANEOUS | Status: DC
  Administered 2016-09-13: 1 via TRANSDERMAL
  Filled 2016-09-13: qty 1

## 2016-09-13 MED ORDER — ONDANSETRON HCL 4 MG/2ML IJ SOLN
4.0000 mg | Freq: Once | INTRAMUSCULAR | Status: AC
  Administered 2016-09-13: 4 mg via INTRAVENOUS
  Filled 2016-09-13: qty 2

## 2016-09-13 MED ORDER — HYDROCHLOROTHIAZIDE 25 MG PO TABS: 25.0000 mg | ORAL_TABLET | Freq: Every day | ORAL | 0 refills | Status: DC

## 2016-09-13 MED ORDER — SODIUM CHLORIDE 0.9 % IV BOLUS (SEPSIS)
1000.0000 mL | Freq: Once | INTRAVENOUS | Status: AC
  Administered 2016-09-13: 1000 mL via INTRAVENOUS

## 2016-09-13 NOTE — ED Notes (Signed)
Confirmed with lab and they will add the second urinalysis off of the urine obtained from the in and out cath.

## 2016-09-13 NOTE — Discharge Instructions (Signed)
Dr. Glo Herring will see you this Friday at 8:30 AM. Please start taking the hydrochlorothiazide once daily.  Please follow with your primary care doctor in the next 2 days for a check-up. They must obtain records for further management.   Do not hesitate to return to the Emergency Department for any new, worsening or concerning symptoms.

## 2016-09-13 NOTE — ED Notes (Signed)
Unable to collect labs ultrasound is in the room

## 2016-09-13 NOTE — ED Provider Notes (Signed)
[facility] DEPT Provider Note   CSN: [id number] Arrival date & time: 09/13/16  0444     History   Chief Complaint Chief Complaint  Patient presents with  . Flank Pain    HPI  Blood pressure 133/91, pulse 99, temperature 98.2 F (36.8 C), temperature source Oral, resp. rate 16, height 5\' 6"  (1.676 m), weight 74.8 kg, SpO2 100 %, unknown if currently breastfeeding.  Kathy Zimmerman is a 26 y.o. female who is 15 days postpartum for C-section with history of preeclampsia complaining of persistent severe 7 out of 10 left flank pain. She endorses reduced by mouth intake with no nausea, vomiting, fever, chills, dysuria, hematuria. She endorses typical postpartum vaginal bleeding with mild epigastric pain, no lower abdominal pain. She was seen for similar 2 days ago and had negative CTA. She's been taking ibuprofen and Tylenol at home with little relief. No history of kidney stones. He should also in dose endorses floaters and flashes which she states started today. She missed her dose of Procardia this morning. She reports that she is feeling anxious and having intermittent anxiety attack she's concerned that she's having preeclampsia.   OB/GYN: Dr. Glo Herring in [place]. She delivered at [facility] because the NICU was full at [facility].  Past Medical History:  Diagnosis Date  . Anxiety   . Asthma   . IBS (irritable bowel syndrome)   . Panic attack   . Preeclampsia     Patient Active Problem List   Diagnosis Date Noted  . Hyperemesis gravidarum before end of [redacted] week gestation, dehydration 03/30/2016  . Supervision of normal pregnancy 03/20/2016  . Previous cesarean delivery, antepartum 03/20/2016  . Hx of severe preeclampsia, prior pregnancy, currently pregnant 03/20/2016  . Rh negative state in antepartum period 03/20/2016  . Short interval between pregnancies affecting pregnancy, antepartum 03/20/2016  . Atypical chest pain 05/31/2015  . Anxiety 04/11/2015  . Retinal  detachment of left eye with retinal break 08/08/2011    Class: Acute    Past Surgical History:  Procedure Laterality Date  . CESAREAN SECTION N/A 04/10/2015   Procedure: CESAREAN SECTION;  Surgeon: Crawford Givens, MD;  Location: [facility];  Service: Obstetrics;  Laterality: N/A;  . EYE SURGERY     laser  . SCLERAL BUCKLE  08/09/2011   Procedure: SCLERAL BUCKLE;  Surgeon: Clent Demark Rankin;  Location: [facility];  Service: Ophthalmology;  Laterality: Left;  with cryo  . TENDON REPAIR      OB History    Gravida Para Term Preterm AB Living   2 1   1   1    SAB TAB Ectopic Multiple Live Births         0 1       Home Medications    Prior to Admission medications   Medication Sig Start Date End Date Taking? Authorizing Provider  acetaminophen (TYLENOL) 500 MG tablet Take 500 mg by mouth every 6 (six) hours as needed for moderate pain.    Yes Historical Provider, MD  ALPRAZolam Duanne Moron) 0.5 MG tablet Take 1 tablet by mouth every 8 (eight) hours as needed for anxiety.  09/10/16  Yes Historical Provider, MD  ferrous sulfate 325 (65 FE) MG tablet Take 1 tablet (325 mg total) by mouth 2 (two) times daily with a meal. Patient taking differently: Take 325 mg by mouth daily with breakfast.  08/08/16  Yes Roma Schanz, CNM  ibuprofen (ADVIL,MOTRIN) 800 MG tablet Take 1 tablet by mouth every 8 (eight) hours  as needed for moderate pain.  09/01/16  Yes Historical Provider, MD  medroxyPROGESTERone (DEPO-PROVERA) 150 MG/ML injection Inject 150 mg into the muscle every 3 (three) months.   Yes Historical Provider, MD  NIFEdipine (PROCARDIA XL/ADALAT-CC) 60 MG 24 hr tablet Take 60 mg by mouth 2 (two) times daily.   Yes Historical Provider, MD  sertraline (ZOLOFT) 100 MG tablet Take 100 mg by mouth daily.    Yes Historical Provider, MD  hydrochlorothiazide (HYDRODIURIL) 25 MG tablet Take 1 tablet (25 mg total) by mouth daily. 09/13/16   Evanthia Maund, PA-C  lidocaine (LMX) 4 % cream Apply 1 application topically  3 (three) times daily as needed. 09/13/16   Katherine Tout, PA-C  ondansetron (ZOFRAN) 8 MG tablet Take 1 tablet (8 mg total) by mouth every 8 (eight) hours as needed for nausea or vomiting. Patient not taking: Reported on 09/13/2016 07/18/16   Christin Fudge, CNM  promethazine (PHENERGAN) 25 MG tablet TAKE 1 TABLET (25 MG TOTAL) BY MOUTH EVERY 6 (SIX) HOURS AS NEEDED FOR NAUSEA OR VOMITING. Patient not taking: Reported on 09/13/2016 08/14/16   Gwynne Edinger, MD    Family History Family History  Problem Relation Age of Onset  . Depression Father   . Depression Sister   . Mental illness Sister   . Depression Maternal Grandmother   . Mental illness Maternal Grandmother   . Thyroid disease Maternal Grandmother   . Heart disease Maternal Grandfather   . Hypertension Maternal Grandfather   . Hyperlipidemia Maternal Grandfather   . CAD Maternal Grandfather   . Thyroid disease Other   . CAD Other     Social History Social History  Substance Use Topics  . Smoking status: Never Smoker  . Smokeless tobacco: Never Used  . Alcohol use No     Allergies   Sulfa antibiotics   Review of Systems Review of Systems  10 systems reviewed and found to be negative, except as noted in the HPI.   Physical Exam Updated Vital Signs BP 149/100 (BP Location: Left Arm)   Pulse 79   Temp 98.2 F (36.8 C) (Oral)   Resp 16   Ht 5\' 6"  (1.676 m)   Wt 74.8 kg   LMP  (LMP Unknown)   SpO2 98%   BMI 26.63 kg/m   Physical Exam  Constitutional: She is oriented to person, place, and time. She appears well-developed and well-nourished. No distress.  HENT:  Head: Normocephalic and atraumatic.  Mouth/Throat: Oropharynx is clear and moist.  Eyes: Conjunctivae and EOM are normal. Pupils are equal, round, and reactive to light.  Neck: Normal range of motion.  Cardiovascular: Normal rate, regular rhythm and intact distal pulses.   Pulmonary/Chest: Effort normal and breath sounds normal. No  respiratory distress. She has no wheezes. She has no rales. She exhibits no tenderness.  Abdominal: Soft. She exhibits no distension and no mass. There is no tenderness. There is no rebound and no guarding. No hernia.  Genitourinary:  Genitourinary Comments: Left-sided CVA tenderness with diffuse tenderness palpation along the left flank  Musculoskeletal: Normal range of motion.  Neurological: She is alert and oriented to person, place, and time. She displays normal reflexes. No cranial nerve deficit or sensory deficit. She exhibits normal muscle tone. Coordination normal.  Skin: Capillary refill takes less than 2 seconds. She is not diaphoretic.  Psychiatric: She has a normal mood and affect.  Nursing note and vitals reviewed.    ED Treatments / Results  Labs (all labs  ordered are listed, but only abnormal results are displayed) Labs Reviewed  URINALYSIS, ROUTINE W REFLEX MICROSCOPIC - Abnormal; Notable for the following:       Result Value   Hgb urine dipstick LARGE (*)    Leukocytes, UA LARGE (*)    Bacteria, UA RARE (*)    Squamous Epithelial / LPF 0-5 (*)    All other components within normal limits  CBC WITH DIFFERENTIAL/PLATELET - Abnormal; Notable for the following:    Hemoglobin 11.2 (*)    HCT 34.7 (*)    All other components within normal limits  COMPREHENSIVE METABOLIC PANEL - Abnormal; Notable for the following:    CO2 21 (*)    Total Protein 8.7 (*)    Total Bilirubin 0.2 (*)    All other components within normal limits  LIPASE, BLOOD  PROTEIN / CREATININE RATIO, URINE  URINALYSIS, ROUTINE W REFLEX MICROSCOPIC  POC URINE PREG, ED    EKG  EKG Interpretation None       Radiology Dg Chest 2 View  Result Date: 09/11/2016 CLINICAL DATA:  Left chest pain. EXAM: CHEST  2 VIEW COMPARISON:  May 27, 2015 FINDINGS: The heart size and mediastinal contours are within normal limits. There is no focal infiltrate, pulmonary edema, or pleural effusion. The visualized  skeletal structures are unremarkable. IMPRESSION: No active cardiopulmonary disease. Electronically Signed   By: Abelardo Diesel M.D.   On: 09/11/2016 20:00   Ct Angio Chest Pe W And/or Wo Contrast  Result Date: 09/11/2016 CLINICAL DATA:  Chest pain with inhalation. Patient has C-section on August 29, 2016. Assess for pulmonary embolus. EXAM: CT ANGIOGRAPHY CHEST WITH CONTRAST TECHNIQUE: Multidetector CT imaging of the chest was performed using the standard protocol during bolus administration of intravenous contrast. Multiplanar CT image reconstructions and MIPs were obtained to evaluate the vascular anatomy. CONTRAST:  100 mL Isovue 370 COMPARISON:  May 22, 2015 FINDINGS: Cardiovascular: Satisfactory opacification of the pulmonary arteries to the segmental level. No evidence of pulmonary embolism. Normal heart size. No pericardial effusion. Mediastinum/Nodes: No enlarged mediastinal, hilar, or axillary lymph nodes. Thyroid gland, trachea, and esophagus demonstrate no significant findings. Lungs/Pleura: There is a 1.3 cm area of patchy ground-glass opacity in the posterior inferior right lower lobe. The lungs are otherwise clear. There is no pleural effusion. There is no pulmonary edema. Upper Abdomen: No acute abnormality. There is a small hiatal hernia. Musculoskeletal: No chest wall abnormality. No acute or significant osseous findings. Review of the MIP images confirms the above findings. IMPRESSION: No pulmonary embolus. Small patchy area of ground-glass opacity in the posterior inferior right lower lobe. This could represent developing small pneumonia. Follow-up after treatment is recommended to ensure resolution. Electronically Signed   By: Abelardo Diesel M.D.   On: 09/11/2016 21:39   US Abdomen Complete  Result Date: 09/13/2016 CLINICAL DATA:  Left upper quadrant pain. EXAM: ABDOMEN ULTRASOUND COMPLETE COMPARISON:  Ultrasound 04/10/2015. FINDINGS: Gallbladder: No gallstones or wall thickening  visualized. No sonographic Murphy sign noted by sonographer. Common bile duct: Diameter: 1.7 mm Liver: No focal lesion identified. Within normal limits in parenchymal echogenicity. IVC: No abnormality visualized. Pancreas: Visualized portion unremarkable. Spleen: Size and appearance within normal limits. Right Kidney: Length: 12.3 cm. Echogenicity within normal limits. No mass or hydronephrosis visualized. Left Kidney: Length: 12.6 cm. Echogenicity within normal limits. No mass or hydronephrosis visualized. Abdominal aorta: No aneurysm visualized. Other findings: None. IMPRESSION: No acute or focal abnormality. Electronically Signed   By: Marcello Moores  Register  On: 09/13/2016 08:13    Procedures Procedures (including critical care time)  Medications Ordered in ED Medications  ALPRAZolam (XANAX) tablet 0.5 mg (0.5 mg Oral Given 09/13/16 1211)  NIFEdipine (PROCARDIA-XL/ADALAT CC) 24 hr tablet 60 mg (60 mg Oral Given 09/13/16 0926)  lidocaine (LIDODERM) 5 % 1 patch (1 patch Transdermal Patch Applied 09/13/16 1211)  sodium chloride 0.9 % bolus 1,000 mL (0 mLs Intravenous Stopped 09/13/16 0926)  morphine 4 MG/ML injection 4 mg (4 mg Intravenous Given 09/13/16 0819)  ondansetron (ZOFRAN) injection 4 mg (4 mg Intravenous Given 09/13/16 0817)  LORazepam (ATIVAN) injection 0.5 mg (0.5 mg Intravenous Given 09/13/16 0827)  hydrochlorothiazide (MICROZIDE) capsule 25 mg (25 mg Oral Given 09/13/16 1239)  acetaminophen (TYLENOL) tablet 1,000 mg (1,000 mg Oral Given 09/13/16 1323)     Initial Impression / Assessment and Plan / ED Course  I have reviewed the triage vital signs and the nursing notes.  Pertinent labs & imaging results that were available during my care of the patient were reviewed by me and considered in my medical decision making (see chart for details).  Clinical Course     Vitals:   09/13/16 0628 09/13/16 0834 09/13/16 1048 09/13/16 1237  BP: 133/91 136/93 144/94 149/100  Pulse: 99 93 80 79  Resp: 16 18  18 16   Temp:      TempSrc:      SpO2: 100% 99% 100% 98%  Weight:      Height:        Medications  ALPRAZolam (XANAX) tablet 0.5 mg (0.5 mg Oral Given 09/13/16 1211)  NIFEdipine (PROCARDIA-XL/ADALAT CC) 24 hr tablet 60 mg (60 mg Oral Given 09/13/16 0926)  lidocaine (LIDODERM) 5 % 1 patch (1 patch Transdermal Patch Applied 09/13/16 1211)  sodium chloride 0.9 % bolus 1,000 mL (0 mLs Intravenous Stopped 09/13/16 0926)  morphine 4 MG/ML injection 4 mg (4 mg Intravenous Given 09/13/16 0819)  ondansetron (ZOFRAN) injection 4 mg (4 mg Intravenous Given 09/13/16 0817)  LORazepam (ATIVAN) injection 0.5 mg (0.5 mg Intravenous Given 09/13/16 0827)  hydrochlorothiazide (MICROZIDE) capsule 25 mg (25 mg Oral Given 09/13/16 1239)  acetaminophen (TYLENOL) tablet 1,000 mg (1,000 mg Oral Given 09/13/16 1323)    Kathy Zimmerman is 26 y.o. female presenting with Severe left flank pain, she's 15 days postpartum, is concerned that this could be preeclampsia because of her elevated blood pressure. She recently started Procardia 60 mg twice a day. She's having floaters in her vision but no headache or edema. She had a negative workup for PE several days ago. She does have blood in her urine however she is menstruating, will obtain catheterized urine specimen and ultrasound with basic blood work.  Blood work reassuring with normal LFTs, platelets. Cath urine clean and without signs of infection. Ultrasound without abnormalities including hydronephrosis. There is an element of her anxiety that is increasing her blood pressure, I doubt this is preeclampsia.  OB/GYN consult from Dr. Glo Herring appreciated: He is very familiar with this patient, as patient is not breast-feeding he thinks that adding hydrochlorothiazide would be beneficial, he will follow with her in the office at 8:30 AM on Friday. Agrees that this is less likely preeclampsia given the reassuring blood work and protein to creatinine ratio, think that there may be an element  of her anxiety playing into the elevation in her blood pressure.  Evaluation does not show pathology that would require ongoing emergent intervention or inpatient treatment. Pt is hemodynamically stable and mentating appropriately. Discussed findings  and plan with patient/guardian, who agrees with care plan. All questions answered. Return precautions discussed and outpatient follow up given.    Final Clinical Impressions(s) / ED Diagnoses   Final diagnoses:  Left flank pain  Elevated blood pressure reading  Anxiety    New Prescriptions Discharge Medication List as of 09/13/2016 12:54 PM    START taking these medications   Details  hydrochlorothiazide (HYDRODIURIL) 25 MG tablet Take 1 tablet (25 mg total) by mouth daily., Starting Wed 09/13/2016, [employer], PA-C 09/13/16 [place], MD 09/13/16 [phone]

## 2016-09-13 NOTE — ED Notes (Signed)
Patient has severe anxiety regarding her preeclampsia from recent childbirth. Patient BP wnl, however pt has fear/anxiety of having a seizure. Pt reports she is lightheaded and dizzy.

## 2016-09-13 NOTE — ED Notes (Signed)
US at bedside

## 2016-09-13 NOTE — ED Triage Notes (Signed)
Patient is alert and oriented x4.  She is complaining of a dull nagging pain in the left flank area.  Currently she rates her pain 6 of 10.  Patient denies any Hx of this issue.

## 2016-09-13 NOTE — ED Notes (Signed)
PT DISCHARGED. INSTRUCTIONS AND PRESCRIPTIONS GIVEN. AAOX4. PT IN NO APPARENT DISTRESS. THE OPPORTUNITY TO ASK QUESTIONS WAS PROVIDED. 

## 2016-09-14 ENCOUNTER — Ambulatory Visit (INDEPENDENT_AMBULATORY_CARE_PROVIDER_SITE_OTHER): Payer: BLUE CROSS/BLUE SHIELD | Admitting: Obstetrics and Gynecology

## 2016-09-14 ENCOUNTER — Telehealth: Payer: Self-pay | Admitting: *Deleted

## 2016-09-14 ENCOUNTER — Encounter: Payer: Self-pay | Admitting: Obstetrics and Gynecology

## 2016-09-14 DIAGNOSIS — Z0131 Encounter for examination of blood pressure with abnormal findings: Secondary | ICD-10-CM

## 2016-09-14 DIAGNOSIS — Z3009 Encounter for other general counseling and advice on contraception: Secondary | ICD-10-CM

## 2016-09-14 DIAGNOSIS — Z9889 Other specified postprocedural states: Secondary | ICD-10-CM | POA: Diagnosis not present

## 2016-09-14 MED ORDER — ACETAMINOPHEN-CODEINE #3 300-30 MG PO TABS: 2.0000 | ORAL_TABLET | ORAL | 2 refills | Status: DC | PRN

## 2016-09-14 MED ORDER — ALPRAZOLAM 1 MG PO TABS: 1.0000 mg | ORAL_TABLET | Freq: Three times a day (TID) | ORAL | 1 refills | Status: DC | PRN

## 2016-09-14 NOTE — Telephone Encounter (Signed)
Tylenol #3 to pharmacy for h/a

## 2016-09-14 NOTE — Telephone Encounter (Signed)
Patient called stating you were going to prescribe her something for her headaches. Please advise

## 2016-09-14 NOTE — Progress Notes (Signed)
Subjective:    Kathy Zimmerman is a 26 y.o. G22P0101 Caucasian female who presents for a postpartum visit. She is 2 weeks postpartum following a repeat cesarean section, low transverse incision at 33 gestational weeks. Anesthesia: epidural. I have fully reviewed the prenatal course delivery at [facility] . Postpartum course has been notable for continuation of pt's ongoing anxiety and panica attacks.. Baby's course has been good, the baby is home. Baby is feeding by bottle - .. Bleeding thin lochia. Bowel function is normal. Bladder function is normal. Patient is not sexually active. Last sexual activity: . Contraception method is abstinence. Postpartum depression screening: negative. Score .  Last pap The following portions of the patient's history were reviewed and updated as appropriate: allergies, current medications, past medical history, past surgical history and problem list.  Review of Systems Pertinent items are noted in HPI.   Vitals:   09/14/16 1345  BP: (!) 128/93  Pulse: 98  Weight: 165 lb (74.8 kg)    Objective:   General:  alert, cooperative and no distress   Breasts:  deferred, no complaints  Lungs: clear to auscultation bilaterally  Heart:  regular rate and rhythm  Abdomen: soft, nontender healing cesarean scar   Vulva: normal  Vagina: normal vagina  Cervix:  closed  Corpus: Well-involuted  Adnexa:  Non-palpable  Rectal Exam: none hemorrhoids        Assessment:   Postpartum exam 3 wks s/p c/s repeat bottlefeeding Depression screening Contraception counseling   Plan:  : none Follow up in: 4 weeks for postpartum check or earlier if needed  [place], WHNP-BC 09/14/2016 10:05 PM

## 2016-09-14 NOTE — ED Provider Notes (Signed)
[facility] DEPT Provider Note   CSN: [id number] Arrival date & time: 09/11/16  1742     History   Chief Complaint Chief Complaint  Patient presents with  . Abdominal Pain    HPI Kathy Zimmerman is a 26 y.o. female.  Pt complains of htn,  She had preeclampsia and delivered with in 2 weeks   The history is provided by the patient.  Weakness  Primary symptoms include no focal weakness. The problem has not changed since onset.There was no focality noted. There has been no fever. Pertinent negatives include no shortness of breath, no chest pain and no headaches. There were no medications administered prior to arrival. Associated medical issues do not include trauma.    Past Medical History:  Diagnosis Date  . Anxiety   . Asthma   . IBS (irritable bowel syndrome)   . Panic attack   . Preeclampsia     Patient Active Problem List   Diagnosis Date Noted  . Hyperemesis gravidarum before end of [redacted] week gestation, dehydration 03/30/2016  . Supervision of normal pregnancy 03/20/2016  . Previous cesarean delivery, antepartum 03/20/2016  . Hx of severe preeclampsia, prior pregnancy, currently pregnant 03/20/2016  . Rh negative state in antepartum period 03/20/2016  . Short interval between pregnancies affecting pregnancy, antepartum 03/20/2016  . Atypical chest pain 05/31/2015  . Anxiety 04/11/2015  . Retinal detachment of left eye with retinal break 08/08/2011    Class: Acute    Past Surgical History:  Procedure Laterality Date  . CESAREAN SECTION N/A 04/10/2015   Procedure: CESAREAN SECTION;  Surgeon: Crawford Givens, MD;  Location: [facility];  Service: Obstetrics;  Laterality: N/A;  . EYE SURGERY     laser  . SCLERAL BUCKLE  08/09/2011   Procedure: SCLERAL BUCKLE;  Surgeon: Clent Demark Rankin;  Location: [facility];  Service: Ophthalmology;  Laterality: Left;  with cryo  . TENDON REPAIR      OB History    Gravida Para Term Preterm AB Living   2 1   1   1    SAB TAB Ectopic  Multiple Live Births         0 1       Home Medications    Prior to Admission medications   Medication Sig Start Date End Date Taking? Authorizing Provider  acetaminophen (TYLENOL) 500 MG tablet Take 500 mg by mouth every 6 (six) hours as needed for moderate pain.    Yes Historical Provider, MD  ALPRAZolam Duanne Moron) 0.5 MG tablet Take 1 tablet by mouth every 8 (eight) hours as needed for anxiety.  09/10/16  Yes Historical Provider, MD  ferrous sulfate 325 (65 FE) MG tablet Take 1 tablet (325 mg total) by mouth 2 (two) times daily with a meal. Patient taking differently: Take 325 mg by mouth daily with breakfast.  08/08/16  Yes Roma Schanz, CNM  ibuprofen (ADVIL,MOTRIN) 800 MG tablet Take 1 tablet by mouth every 8 (eight) hours as needed for moderate pain.  09/01/16  Yes Historical Provider, MD  medroxyPROGESTERone (DEPO-PROVERA) 150 MG/ML injection Inject 150 mg into the muscle every 3 (three) months.   Yes Historical Provider, MD  NIFEdipine (PROCARDIA XL/ADALAT-CC) 60 MG 24 hr tablet Take 60 mg by mouth 2 (two) times daily.   Yes Historical Provider, MD  ondansetron (ZOFRAN) 8 MG tablet Take 1 tablet (8 mg total) by mouth every 8 (eight) hours as needed for nausea or vomiting. Patient not taking: Reported on 09/13/2016 07/18/16  Yes  Christin Fudge, CNM  promethazine (PHENERGAN) 25 MG tablet TAKE 1 TABLET (25 MG TOTAL) BY MOUTH EVERY 6 (SIX) HOURS AS NEEDED FOR NAUSEA OR VOMITING. Patient not taking: Reported on 09/13/2016 08/14/16  Yes Gwynne Edinger, MD  sertraline (ZOLOFT) 100 MG tablet Take 100 mg by mouth daily.    Yes Historical Provider, MD  hydrochlorothiazide (HYDRODIURIL) 25 MG tablet Take 1 tablet (25 mg total) by mouth daily. 09/13/16   Nicole Pisciotta, PA-C  lidocaine (LMX) 4 % cream Apply 1 application topically 3 (three) times daily as needed. 09/13/16   Elmyra Ricks Pisciotta, PA-C    Family History Family History  Problem Relation Age of Onset  . Depression Father     . Depression Sister   . Mental illness Sister   . Depression Maternal Grandmother   . Mental illness Maternal Grandmother   . Thyroid disease Maternal Grandmother   . Heart disease Maternal Grandfather   . Hypertension Maternal Grandfather   . Hyperlipidemia Maternal Grandfather   . CAD Maternal Grandfather   . Thyroid disease Other   . CAD Other     Social History Social History  Substance Use Topics  . Smoking status: Never Smoker  . Smokeless tobacco: Never Used  . Alcohol use No     Allergies   Sulfa antibiotics   Review of Systems Review of Systems  Constitutional: Negative for appetite change and fatigue.  HENT: Negative for congestion, ear discharge and sinus pressure.   Eyes: Negative for discharge.  Respiratory: Negative for cough and shortness of breath.   Cardiovascular: Negative for chest pain.  Gastrointestinal: Positive for abdominal pain. Negative for diarrhea.  Genitourinary: Negative for frequency and hematuria.  Musculoskeletal: Negative for back pain.  Skin: Negative for rash.  Neurological: Positive for weakness. Negative for focal weakness, seizures and headaches.  Psychiatric/Behavioral: Negative for hallucinations.     Physical Exam Updated Vital Signs BP 124/89 (BP Location: Left Arm)   Pulse 93   Temp 98.4 F (36.9 C) (Oral)   Resp 18   Ht 5\' 6"  (1.676 m)   Wt 169 lb (76.7 kg)   LMP  (LMP Unknown)   SpO2 100%   Breastfeeding? Unknown   BMI 27.28 kg/m   Physical Exam  Constitutional: She is oriented to person, place, and time. She appears well-developed.  HENT:  Head: Normocephalic.  Eyes: Conjunctivae and EOM are normal. No scleral icterus.  Neck: Neck supple. No thyromegaly present.  Cardiovascular: Normal rate and regular rhythm.  Exam reveals no gallop and no friction rub.   No murmur heard. Pulmonary/Chest: No stridor. She has no wheezes. She has no rales. She exhibits no tenderness.  Abdominal: She exhibits no  distension. There is tenderness. There is no rebound.  Pt with mild ruq tender and pain with breathing.  Musculoskeletal: Normal range of motion. She exhibits no edema.  Lymphadenopathy:    She has no cervical adenopathy.  Neurological: She is oriented to person, place, and time. She exhibits normal muscle tone. Coordination normal.  Skin: No rash noted. No erythema.  Psychiatric: She has a normal mood and affect. Her behavior is normal.     ED Treatments / Results  Labs (all labs ordered are listed, but only abnormal results are displayed) Labs Reviewed  CBC WITH DIFFERENTIAL/PLATELET - Abnormal; Notable for the following:       Result Value   WBC 10.7 (*)    RBC 3.86 (*)    Hemoglobin 11.0 (*)    HCT  33.7 (*)    All other components within normal limits  COMPREHENSIVE METABOLIC PANEL - Abnormal; Notable for the following:    Total Protein 8.6 (*)    All other components within normal limits  URINALYSIS, ROUTINE W REFLEX MICROSCOPIC - Abnormal; Notable for the following:    Color, Urine COLORLESS (*)    All other components within normal limits  D-DIMER, QUANTITATIVE (NOT AT [facility]) - Abnormal; Notable for the following:    D-Dimer, Quant 2.45 (*)    All other components within normal limits  PROTEIN / CREATININE RATIO, URINE    EKG  EKG Interpretation None       Radiology US Abdomen Complete  Result Date: 09/13/2016 CLINICAL DATA:  Left upper quadrant pain. EXAM: ABDOMEN ULTRASOUND COMPLETE COMPARISON:  Ultrasound 04/10/2015. FINDINGS: Gallbladder: No gallstones or wall thickening visualized. No sonographic Murphy sign noted by sonographer. Common bile duct: Diameter: 1.7 mm Liver: No focal lesion identified. Within normal limits in parenchymal echogenicity. IVC: No abnormality visualized. Pancreas: Visualized portion unremarkable. Spleen: Size and appearance within normal limits. Right Kidney: Length: 12.3 cm. Echogenicity within normal limits. No mass or hydronephrosis  visualized. Left Kidney: Length: 12.6 cm. Echogenicity within normal limits. No mass or hydronephrosis visualized. Abdominal aorta: No aneurysm visualized. Other findings: None. IMPRESSION: No acute or focal abnormality. Electronically Signed   By: Marcello Moores  Register   On: 09/13/2016 08:13    Procedures Procedures (including critical care time)  Medications Ordered in ED Medications  NIFEdipine (PROCARDIA-XL/ADALAT-CC/NIFEDICAL-XL) 24 hr tablet 60 mg (60 mg Oral Given 09/11/16 1938)  ALPRAZolam Duanne Moron) tablet 0.5 mg (0.5 mg Oral Given 09/11/16 1947)  ALPRAZolam Duanne Moron) tablet 0.5 mg (0.5 mg Oral Given 09/11/16 2057)  iopamidol (ISOVUE-370) 76 % injection 100 mL (100 mLs Intravenous Contrast Given 09/11/16 2103)     Initial Impression / Assessment and Plan / ED Course  I have reviewed the triage vital signs and the nursing notes.  Pertinent labs & imaging results that were available during my care of the patient were reviewed by me and considered in my medical decision making (see chart for details).  Clinical Course     Pt with htn and preeclampsia.  I spoke with gyn and labs were checked including urine protein creatinine ratio.  Which was normal.  Pt given her procardia dose and was watched.  bp improve. Pt to follow up with gyn,  PE also ruled out  Final Clinical Impressions(s) / ED Diagnoses   Final diagnoses:  Hypertension, unspecified type    New Prescriptions Discharge Medication List as of 09/11/2016  9:49 PM       Milton Ferguson, MD 09/14/16 1144

## 2016-09-15 ENCOUNTER — Telehealth: Payer: Self-pay | Admitting: *Deleted

## 2016-09-15 NOTE — Telephone Encounter (Signed)
Pt called to inform her that prescription for Tylenol #3 was not sent electronically to the pharmacy and she needed to pick up from the office. Pt stated she would before office hours end.

## 2016-09-16 ENCOUNTER — Emergency Department (HOSPITAL_COMMUNITY)
Admission: EM | Admit: 2016-09-16 | Discharge: 2016-09-16 | Disposition: A | Discharge status: Home / Self Care | Payer: BLUE CROSS/BLUE SHIELD | Attending: Emergency Medicine | Admitting: Emergency Medicine

## 2016-09-16 ENCOUNTER — Encounter (HOSPITAL_COMMUNITY): Payer: Self-pay | Admitting: *Deleted

## 2016-09-16 DIAGNOSIS — Z79899 Other long term (current) drug therapy: Secondary | ICD-10-CM | POA: Insufficient documentation

## 2016-09-16 DIAGNOSIS — R252 Cramp and spasm: Secondary | ICD-10-CM | POA: Diagnosis not present

## 2016-09-16 DIAGNOSIS — J45909 Unspecified asthma, uncomplicated: Secondary | ICD-10-CM | POA: Diagnosis not present

## 2016-09-16 DIAGNOSIS — M79604 Pain in right leg: Secondary | ICD-10-CM | POA: Diagnosis present

## 2016-09-16 DIAGNOSIS — Z7982 Long term (current) use of aspirin: Secondary | ICD-10-CM | POA: Diagnosis not present

## 2016-09-16 MED ORDER — IBUPROFEN 600 MG PO TABS: 600.0000 mg | ORAL_TABLET | Freq: Four times a day (QID) | ORAL | 0 refills | Status: DC | PRN

## 2016-09-16 NOTE — ED Triage Notes (Signed)
Pt c/o cramping pain to right calf since yesterday. Pt had c-section delivery on 08/29/16. Pt called OB doctor and nurse line told pt to come to ED for evaluation of possible blood clot.

## 2016-09-16 NOTE — ED Provider Notes (Signed)
[facility] DEPT Provider Note   CSN: [id number] Arrival date & time: 09/16/16  1054   By signing my name below, I, Collene Leyden, attest that this documentation has been prepared under the direction and in the presence of Julianne Rice, MD. Electronically Signed: Collene Leyden, Scribe. 09/16/16. 1:24 PM.  History   Chief Complaint Chief Complaint  Patient presents with  . Leg Pain   HPI Comments: Kathy Zimmerman is a 26 y.o. female with a PMHx of anxiety, who presents to the Emergency Department complaining of intermittent posterior right leg pain that began 2 days ago. Patient has associated right calf cramping, vaginal bleeding, and left back pain. She states she seen her gynecologist yesterday in which she was placed on baby aspirin. She denies any blood in the stool or swelling.   The history is provided by the patient. No language interpreter was used.    Past Medical History:  Diagnosis Date  . Anxiety   . Asthma   . IBS (irritable bowel syndrome)   . Panic attack   . Preeclampsia     Patient Active Problem List   Diagnosis Date Noted  . Hyperemesis gravidarum before end of [redacted] week gestation, dehydration 03/30/2016  . Supervision of normal pregnancy 03/20/2016  . Previous cesarean delivery, antepartum 03/20/2016  . Hx of severe preeclampsia, prior pregnancy, currently pregnant 03/20/2016  . Rh negative state in antepartum period 03/20/2016  . Short interval between pregnancies affecting pregnancy, antepartum 03/20/2016  . Atypical chest pain 05/31/2015  . Anxiety 04/11/2015  . Retinal detachment of left eye with retinal break 08/08/2011    Class: Acute    Past Surgical History:  Procedure Laterality Date  . CESAREAN SECTION N/A 04/10/2015   Procedure: CESAREAN SECTION;  Surgeon: Crawford Givens, MD;  Location: [facility];  Service: Obstetrics;  Laterality: N/A;  . EYE SURGERY     laser  . SCLERAL BUCKLE  08/09/2011   Procedure: SCLERAL BUCKLE;  Surgeon: Clent Demark  Rankin;  Location: [facility];  Service: Ophthalmology;  Laterality: Left;  with cryo  . TENDON REPAIR      OB History    Gravida Para Term Preterm AB Living   2 1   1   1    SAB TAB Ectopic Multiple Live Births         0 1       Home Medications    Prior to Admission medications   Medication Sig Start Date End Date Taking? Authorizing Provider  acetaminophen (TYLENOL) 500 MG tablet Take 500 mg by mouth every 6 (six) hours as needed for moderate pain.    Yes Historical Provider, MD  acetaminophen-codeine (TYLENOL #3) 300-30 MG tablet Take 2 tablets by mouth every 4 (four) hours as needed for moderate pain. 09/14/16  Yes Jonnie Kind, MD  ALPRAZolam Duanne Moron) 1 MG tablet Take 1 tablet (1 mg total) by mouth every 8 (eight) hours as needed for anxiety. 09/14/16  Yes Jonnie Kind, MD  aspirin EC 81 MG tablet Take 81 mg by mouth daily.   Yes Historical Provider, MD  ferrous sulfate 325 (65 FE) MG tablet Take 1 tablet (325 mg total) by mouth 2 (two) times daily with a meal. Patient taking differently: Take 325 mg by mouth daily with breakfast.  08/08/16  Yes Roma Schanz, CNM  medroxyPROGESTERone (DEPO-PROVERA) 150 MG/ML injection Inject 150 mg into the muscle every 3 (three) months.   Yes Historical Provider, MD  NIFEdipine (PROCARDIA XL/ADALAT-CC) 60 MG  24 hr tablet Take 60 mg by mouth 2 (two) times daily.   Yes Historical Provider, MD  sertraline (ZOLOFT) 100 MG tablet Take 100 mg by mouth daily.    Yes Historical Provider, MD  hydrochlorothiazide (HYDRODIURIL) 25 MG tablet Take 1 tablet (25 mg total) by mouth daily. Patient not taking: Reported on 09/16/2016 09/13/16   Elmyra Ricks Pisciotta, PA-C  ibuprofen (ADVIL,MOTRIN) 600 MG tablet Take 1 tablet (600 mg total) by mouth every 6 (six) hours as needed. 09/16/16   Julianne Rice, MD  lidocaine (LMX) 4 % cream Apply 1 application topically 3 (three) times daily as needed. Patient not taking: Reported on 09/16/2016 09/13/16   Elmyra Ricks Pisciotta, PA-C    ondansetron (ZOFRAN) 8 MG tablet Take 1 tablet (8 mg total) by mouth every 8 (eight) hours as needed for nausea or vomiting. Patient not taking: Reported on 09/14/2016 07/18/16   Christin Fudge, CNM  promethazine (PHENERGAN) 25 MG tablet TAKE 1 TABLET (25 MG TOTAL) BY MOUTH EVERY 6 (SIX) HOURS AS NEEDED FOR NAUSEA OR VOMITING. Patient not taking: Reported on 09/14/2016 08/14/16   Gwynne Edinger, MD    Family History Family History  Problem Relation Age of Onset  . Depression Father   . Depression Sister   . Mental illness Sister   . Depression Maternal Grandmother   . Mental illness Maternal Grandmother   . Thyroid disease Maternal Grandmother   . Heart disease Maternal Grandfather   . Hypertension Maternal Grandfather   . Hyperlipidemia Maternal Grandfather   . CAD Maternal Grandfather   . Thyroid disease Other   . CAD Other     Social History Social History  Substance Use Topics  . Smoking status: Never Smoker  . Smokeless tobacco: Never Used  . Alcohol use No     Allergies   Sulfa antibiotics   Review of Systems Review of Systems  Constitutional: Negative for chills and fever.  Respiratory: Negative for cough and shortness of breath.   Cardiovascular: Negative for chest pain and leg swelling.  Gastrointestinal: Negative for abdominal pain, diarrhea, nausea and vomiting.  Genitourinary: Negative for dysuria and flank pain.  Musculoskeletal: Positive for myalgias. Negative for arthralgias, back pain and neck pain.  Skin: Negative for rash.  Neurological: Negative for dizziness, weakness, light-headedness, numbness and headaches.  Psychiatric/Behavioral: The patient is nervous/anxious.   All other systems reviewed and are negative.    Physical Exam Updated Vital Signs BP 127/82 (BP Location: Right Arm)   Pulse 106   Temp 98.2 F (36.8 C) (Oral)   Resp 18   Ht 5\' 6"  (1.676 m)   Wt 165 lb (74.8 kg)   LMP  (LMP Unknown)   SpO2 99%   BMI 26.63 kg/m    Physical Exam  Constitutional: She is oriented to person, place, and time. She appears well-developed and well-nourished.  HENT:  Head: Normocephalic and atraumatic.  Mouth/Throat: Oropharynx is clear and moist.  Eyes: EOM are normal. Pupils are equal, round, and reactive to light.  Neck: Normal range of motion. Neck supple.  Cardiovascular: Normal rate and regular rhythm.   Pulmonary/Chest: Effort normal and breath sounds normal. No respiratory distress. She has no wheezes. She has no rales. She exhibits no tenderness.  Abdominal: Soft. Bowel sounds are normal. There is no tenderness. There is no rebound and no guarding.  Musculoskeletal: Normal range of motion. She exhibits tenderness. She exhibits no edema.  For mild right posterior upper leg tenderness to palpation of the hamstrings. There  is no swelling to either leg. There is no asymmetry. 2+ dorsalis pedis and posterior tibial pulses. No distended superficial veins. No discoloration or increased warmth.  Neurological: She is alert and oriented to person, place, and time.  Skin: Skin is warm and dry. No rash noted. No erythema.  Psychiatric: She has a normal mood and affect. Her behavior is normal.  Nursing note and vitals reviewed.    ED Treatments / Results  DIAGNOSTIC STUDIES: Oxygen Saturation is 99% on RA, normal by my interpretation.    COORDINATION OF CARE: 1:22 PM Discussed treatment plan with pt at bedside and pt agreed to plan.  Labs (all labs ordered are listed, but only abnormal results are displayed) Labs Reviewed - No data to display  EKG  EKG Interpretation None       Radiology No results found.  Procedures Procedures (including critical care time)  Medications Ordered in ED Medications - No data to display   Initial Impression / Assessment and Plan / ED Course  I have reviewed the triage vital signs and the nursing notes.  Pertinent labs & imaging results that were available during my care  of the patient were reviewed by me and considered in my medical decision making (see chart for details).  Clinical Course    Patient has history of anxiety. She's had multiple studies for DVT and PE which were negative. Muscular tenderness. There is no objective signs for DVT. Low suspicion. Advised follow with her primary physician.   Final Clinical Impressions(s) / ED Diagnoses   Final diagnoses:  Muscle cramps    New Prescriptions Discharge Medication List as of 09/16/2016  1:32 PM    START taking these medications   Details  ibuprofen (ADVIL,MOTRIN) 600 MG tablet Take 1 tablet (600 mg total) by mouth every 6 (six) hours as needed., Starting Sat 09/16/2016, Print       I personally performed the services described in this documentation, which was scribed in my presence. The recorded information has been reviewed and is accurate.       Julianne Rice, MD 09/21/16 1729

## 2016-09-18 ENCOUNTER — Telehealth: Payer: Self-pay | Admitting: Obstetrics & Gynecology

## 2016-09-18 NOTE — Telephone Encounter (Signed)
Left message x 1. JSY 

## 2016-09-18 NOTE — Telephone Encounter (Signed)
Spoke with pt. Pt is on Procardia 60 mg BID. Pt's BP is running low. It has been 90/63. Pt took Procardia 30 mg today and BP was 128/87. Pt wonders if med should be adjusted. Please advise. Thanks!! [place]

## 2016-09-18 NOTE — Telephone Encounter (Signed)
Spoke with pt giving her Kim's recommendations. Pt will go back up to 60 mg BID if BP rises. Pt voiced understanding. [place]

## 2016-09-25 ENCOUNTER — Encounter (HOSPITAL_COMMUNITY): Payer: Self-pay

## 2016-09-25 ENCOUNTER — Emergency Department (HOSPITAL_COMMUNITY): Payer: BLUE CROSS/BLUE SHIELD

## 2016-09-25 ENCOUNTER — Emergency Department (HOSPITAL_COMMUNITY)
Admission: EM | Admit: 2016-09-25 | Discharge: 2016-09-25 | Disposition: A | Discharge status: Home / Self Care | Payer: BLUE CROSS/BLUE SHIELD | Attending: Emergency Medicine | Admitting: Emergency Medicine

## 2016-09-25 DIAGNOSIS — R0602 Shortness of breath: Secondary | ICD-10-CM | POA: Insufficient documentation

## 2016-09-25 DIAGNOSIS — R05 Cough: Secondary | ICD-10-CM | POA: Insufficient documentation

## 2016-09-25 DIAGNOSIS — R079 Chest pain, unspecified: Secondary | ICD-10-CM | POA: Insufficient documentation

## 2016-09-25 DIAGNOSIS — Z79899 Other long term (current) drug therapy: Secondary | ICD-10-CM | POA: Insufficient documentation

## 2016-09-25 DIAGNOSIS — J45909 Unspecified asthma, uncomplicated: Secondary | ICD-10-CM | POA: Insufficient documentation

## 2016-09-25 DIAGNOSIS — Z7982 Long term (current) use of aspirin: Secondary | ICD-10-CM | POA: Insufficient documentation

## 2016-09-25 LAB — BASIC METABOLIC PANEL
Anion gap: 8 (ref 5–15)
BUN: 9 mg/dL (ref 6–20)
CO2: 24 mmol/L (ref 22–32)
Calcium: 9.2 mg/dL (ref 8.9–10.3)
Chloride: 105 mmol/L (ref 101–111)
Creatinine, Ser: 0.75 mg/dL (ref 0.44–1.00)
GFR calc Af Amer: >60 mL/min (ref >60)
GFR calc non Af Amer: >60 mL/min (ref >60)
Glucose, Bld: 89 mg/dL (ref 65–99)
Potassium: 3.5 mmol/L (ref 3.5–5.1)
Sodium: 137 mmol/L (ref 135–145)

## 2016-09-25 LAB — CBC
HCT: 35.9 % — ABNORMAL LOW (ref 36.0–46.0)
Hemoglobin: 11.8 g/dL — ABNORMAL LOW (ref 12.0–15.0)
MCH: 27.5 pg (ref 26.0–34.0)
MCHC: 32.9 g/dL (ref 30.0–36.0)
MCV: 83.7 fL (ref 78.0–100.0)
Platelets: 336 10*3/uL (ref 150–400)
RBC: 4.29 MIL/uL (ref 3.87–5.11)
RDW: 13.9 % (ref 11.5–15.5)
WBC: 8.4 10*3/uL (ref 4.0–10.5)

## 2016-09-25 LAB — TROPONIN I: Troponin I: <0.03 ng/mL (ref <0.03)

## 2016-09-25 NOTE — ED Provider Notes (Signed)
[facility] DEPT Provider Note   CSN: [id number] Arrival date & time: 09/25/16  1948    By signing my name below, I, Macon Large, attest that this documentation has been prepared under the direction and in the presence of Virgel Manifold, MD. Electronically Signed: Macon Large, ED Scribe. 09/25/16. 8:46 PM.  History   Chief Complaint Chief Complaint  Patient presents with  . Chest Pain   The history is provided by the patient. No language interpreter was used.   HPI Comments: Kathy Zimmerman is a 26 y.o. female with PMHx of anxiety, asthma and preeclampsia, who presents to the Emergency Department complaining of moderate, constant, generalized chest pain onset ~1hour ago tongiht. She states her "skin feels like it's on fire" intermittently. She notes she was cooking dinner when her pain suddenly began. Pt reports associated SOB, mild cough and nausea. She notes this pain is worse than what she has experienced before with her anxiety. No modifying factors noted. Pt notes she was diagnosed with preeclampsia a couple of weeks ago after giving birth. Pt states she had a cesarean section at 33 weeks due to preeclampsia with severe features. Denies fever, visual disturbance, HA. She also denies hx of blood clots and acid reflux.   Past Medical History:  Diagnosis Date  . Anxiety   . Asthma   . IBS (irritable bowel syndrome)   . Panic attack   . Preeclampsia     Patient Active Problem List   Diagnosis Date Noted  . Hyperemesis gravidarum before end of [redacted] week gestation, dehydration 03/30/2016  . Supervision of normal pregnancy 03/20/2016  . Previous cesarean delivery, antepartum 03/20/2016  . Hx of severe preeclampsia, prior pregnancy, currently pregnant 03/20/2016  . Rh negative state in antepartum period 03/20/2016  . Short interval between pregnancies affecting pregnancy, antepartum 03/20/2016  . Atypical chest pain 05/31/2015  . Anxiety 04/11/2015  . Retinal detachment  of left eye with retinal break 08/08/2011    Class: Acute    Past Surgical History:  Procedure Laterality Date  . CESAREAN SECTION N/A 04/10/2015   Procedure: CESAREAN SECTION;  Surgeon: Crawford Givens, MD;  Location: [facility];  Service: Obstetrics;  Laterality: N/A;  . EYE SURGERY     laser  . SCLERAL BUCKLE  08/09/2011   Procedure: SCLERAL BUCKLE;  Surgeon: Clent Demark Rankin;  Location: [facility];  Service: Ophthalmology;  Laterality: Left;  with cryo  . TENDON REPAIR      OB History    Gravida Para Term Preterm AB Living   2 1   1   1    SAB TAB Ectopic Multiple Live Births         0 1       Home Medications    Prior to Admission medications   Medication Sig Start Date End Date Taking? Authorizing Provider  acetaminophen (TYLENOL) 500 MG tablet Take 500 mg by mouth every 6 (six) hours as needed for moderate pain.     Historical Provider, MD  acetaminophen-codeine (TYLENOL #3) 300-30 MG tablet Take 2 tablets by mouth every 4 (four) hours as needed for moderate pain. 09/14/16   Jonnie Kind, MD  ALPRAZolam Duanne Moron) 1 MG tablet Take 1 tablet (1 mg total) by mouth every 8 (eight) hours as needed for anxiety. 09/14/16   Jonnie Kind, MD  aspirin EC 81 MG tablet Take 81 mg by mouth daily.    Historical Provider, MD  ferrous sulfate 325 (65 FE) MG tablet Take 1  tablet (325 mg total) by mouth 2 (two) times daily with a meal. Patient taking differently: Take 325 mg by mouth daily with breakfast.  08/08/16   Roma Schanz, CNM  hydrochlorothiazide (HYDRODIURIL) 25 MG tablet Take 1 tablet (25 mg total) by mouth daily. Patient not taking: Reported on 09/16/2016 09/13/16   Elmyra Ricks Pisciotta, PA-C  ibuprofen (ADVIL,MOTRIN) 600 MG tablet Take 1 tablet (600 mg total) by mouth every 6 (six) hours as needed. 09/16/16   Julianne Rice, MD  lidocaine (LMX) 4 % cream Apply 1 application topically 3 (three) times daily as needed. Patient not taking: Reported on 09/16/2016 09/13/16   Elmyra Ricks Pisciotta, PA-C    medroxyPROGESTERone (DEPO-PROVERA) 150 MG/ML injection Inject 150 mg into the muscle every 3 (three) months.    Historical Provider, MD  NIFEdipine (PROCARDIA XL/ADALAT-CC) 60 MG 24 hr tablet Take 60 mg by mouth 2 (two) times daily.    Historical Provider, MD  ondansetron (ZOFRAN) 8 MG tablet Take 1 tablet (8 mg total) by mouth every 8 (eight) hours as needed for nausea or vomiting. Patient not taking: Reported on 09/14/2016 07/18/16   Christin Fudge, CNM  promethazine (PHENERGAN) 25 MG tablet TAKE 1 TABLET (25 MG TOTAL) BY MOUTH EVERY 6 (SIX) HOURS AS NEEDED FOR NAUSEA OR VOMITING. Patient not taking: Reported on 09/14/2016 08/14/16   Gwynne Edinger, MD  sertraline (ZOLOFT) 100 MG tablet Take 100 mg by mouth daily.     Historical Provider, MD    Family History Family History  Problem Relation Age of Onset  . Depression Father   . Depression Sister   . Mental illness Sister   . Depression Maternal Grandmother   . Mental illness Maternal Grandmother   . Thyroid disease Maternal Grandmother   . Heart disease Maternal Grandfather   . Hypertension Maternal Grandfather   . Hyperlipidemia Maternal Grandfather   . CAD Maternal Grandfather   . Thyroid disease Other   . CAD Other     Social History Social History  Substance Use Topics  . Smoking status: Never Smoker  . Smokeless tobacco: Never Used  . Alcohol use No     Allergies   Sulfa antibiotics   Review of Systems Review of Systems  Constitutional: Negative for fever.  Eyes: Negative for visual disturbance.  Respiratory: Positive for cough and shortness of breath.   Cardiovascular: Positive for chest pain.  Neurological: Negative for headaches.  All other systems reviewed and are negative.    Physical Exam Updated Vital Signs BP 128/86 (BP Location: Right Arm)   Pulse 93   Temp 98 F (36.7 C) (Oral)   Resp 20   Ht 5\' 6"  (1.676 m)   Wt 164 lb (74.4 kg)   LMP  (LMP Unknown)   SpO2 100%   Breastfeeding?  No Comment: Gave birth 4 weeks ago.  BMI 26.47 kg/m   Physical Exam  Constitutional: She appears well-developed and well-nourished. No distress.  Pt is anxious, but no acute distress.   HENT:  Head: Normocephalic and atraumatic.  Eyes: Conjunctivae are normal. Right eye exhibits no discharge. Left eye exhibits no discharge.  Pulmonary/Chest: Effort normal. No respiratory distress.  Neurological: She is alert. Coordination normal.  Skin: Skin is warm and dry. No rash noted. She is not diaphoretic. No erythema.  Psychiatric: She has a normal mood and affect.  Nursing note and vitals reviewed.    ED Treatments / Results   DIAGNOSTIC STUDIES: Oxygen Saturation is 100% on RA, normal by  my interpretation.    COORDINATION OF CARE: 8:37 PM Discussed treatment plan with pt at bedside which includes labs, chest imaging and EKG and pt agreed to plan.   Labs (all labs ordered are listed, but only abnormal results are displayed) Labs Reviewed  CBC - Abnormal; Notable for the following:       Result Value   Hemoglobin 11.8 (*)    HCT 35.9 (*)    All other components within normal limits  BASIC METABOLIC PANEL  TROPONIN I    EKG  EKG Interpretation None       Radiology Dg Chest 2 View  Result Date: 09/25/2016 CLINICAL DATA:  Chest burning and pain for several hours tonight. C-section 4 weeks ago. EXAM: CHEST  2 VIEW COMPARISON:  CT and radiographs 09/11/2016. FINDINGS: The heart size and mediastinal contours are normal. The lungs are clear. There is no pleural effusion or pneumothorax. No acute osseous findings are identified. IMPRESSION: Stable chest.  No active cardiopulmonary process. Electronically Signed   By: Richardean Sale M.D.   On: 09/25/2016 20:21    Procedures Procedures (including critical care time)  Medications Ordered in ED Medications - No data to display   Initial Impression / Assessment and Plan / ED Course  I have reviewed the triage vital signs and  the nursing notes.  Pertinent labs & imaging results that were available during my care of the patient were reviewed by me and considered in my medical decision making (see chart for details).  Clinical Course     25yF with CP. I doubt ACS, PE, dissection or other emergent process. There may be some anxiety component. She keeps asking me to check a d-dimer. I have a very low suspicion for PE even with c-section 4 weeks ago. Tried to reassure her. It has been determined that no acute conditions requiring further emergency intervention are present at this time. The patient has been advised of the diagnosis and plan. I reviewed any labs and imaging including any potential incidental findings. We have discussed signs and symptoms that warrant return to the ED and they are listed in the discharge instructions.    Final Clinical Impressions(s) / ED Diagnoses   Final diagnoses:  Nonspecific chest pain    New Prescriptions New Prescriptions   No medications on file   I personally preformed the services scribed in my presence. The recorded information has been reviewed is accurate. Virgel Manifold, MD.      Virgel Manifold, MD 10/06/16 [phone]

## 2016-09-25 NOTE — ED Triage Notes (Signed)
Patient states that she gave birth to her baby 4 weeks ago and had severe preeclampsia with the pregnancy.  They took the baby by c-section at 33 weeks due to the preeclampsia.

## 2016-09-25 NOTE — ED Notes (Signed)
Pt ambulatory to waiting room. Pt verbalized understanding of discharge instructions.   

## 2016-09-25 NOTE — ED Triage Notes (Signed)
Patient states that she has been experiencing chest pain today, described as a burning sensation and feels like her chest is on fire.  I was cooking dinner when it started.  States that she has some shortness of breath.

## 2016-09-27 ENCOUNTER — Ambulatory Visit: Payer: BLUE CROSS/BLUE SHIELD | Admitting: Advanced Practice Midwife

## 2016-09-29 ENCOUNTER — Encounter: Payer: Self-pay | Admitting: Women's Health

## 2016-10-02 ENCOUNTER — Emergency Department (HOSPITAL_COMMUNITY)
Admission: EM | Admit: 2016-10-02 | Discharge: 2016-10-03 | Disposition: A | Discharge status: Home / Self Care | Payer: BLUE CROSS/BLUE SHIELD | Attending: Emergency Medicine | Admitting: Emergency Medicine

## 2016-10-02 ENCOUNTER — Encounter (HOSPITAL_COMMUNITY): Payer: Self-pay

## 2016-10-02 DIAGNOSIS — J45909 Unspecified asthma, uncomplicated: Secondary | ICD-10-CM | POA: Diagnosis not present

## 2016-10-02 DIAGNOSIS — Z79899 Other long term (current) drug therapy: Secondary | ICD-10-CM | POA: Insufficient documentation

## 2016-10-02 DIAGNOSIS — Z7982 Long term (current) use of aspirin: Secondary | ICD-10-CM | POA: Diagnosis not present

## 2016-10-02 DIAGNOSIS — F419 Anxiety disorder, unspecified: Secondary | ICD-10-CM | POA: Insufficient documentation

## 2016-10-02 DIAGNOSIS — J189 Pneumonia, unspecified organism: Secondary | ICD-10-CM | POA: Insufficient documentation

## 2016-10-02 DIAGNOSIS — R0602 Shortness of breath: Secondary | ICD-10-CM | POA: Diagnosis present

## 2016-10-02 LAB — CBC WITH DIFFERENTIAL/PLATELET
Basophils Absolute: 0 10*3/uL (ref 0.0–0.1)
Basophils Relative: 0 %
Eosinophils Absolute: 0.1 10*3/uL (ref 0.0–0.7)
Eosinophils Relative: 1 %
HCT: 32.8 % — ABNORMAL LOW (ref 36.0–46.0)
Hemoglobin: 10.7 g/dL — ABNORMAL LOW (ref 12.0–15.0)
Lymphocytes Relative: 36 %
Lymphs Abs: 3 10*3/uL (ref 0.7–4.0)
MCH: 27 pg (ref 26.0–34.0)
MCHC: 32.6 g/dL (ref 30.0–36.0)
MCV: 82.6 fL (ref 78.0–100.0)
Monocytes Absolute: 0.6 10*3/uL (ref 0.1–1.0)
Monocytes Relative: 7 %
Neutro Abs: 4.7 10*3/uL (ref 1.7–7.7)
Neutrophils Relative %: 56 %
Platelets: 269 10*3/uL (ref 150–400)
RBC: 3.97 MIL/uL (ref 3.87–5.11)
RDW: 13.8 % (ref 11.5–15.5)
WBC: 8.5 10*3/uL (ref 4.0–10.5)

## 2016-10-02 LAB — COMPREHENSIVE METABOLIC PANEL
ALT: 11 U/L — ABNORMAL LOW (ref 14–54)
AST: 13 U/L — ABNORMAL LOW (ref 15–41)
Albumin: 3.6 g/dL (ref 3.5–5.0)
Alkaline Phosphatase: 66 U/L (ref 38–126)
Anion gap: 6 (ref 5–15)
BUN: 10 mg/dL (ref 6–20)
CO2: 26 mmol/L (ref 22–32)
Calcium: 8.8 mg/dL — ABNORMAL LOW (ref 8.9–10.3)
Chloride: 105 mmol/L (ref 101–111)
Creatinine, Ser: 0.74 mg/dL (ref 0.44–1.00)
GFR calc Af Amer: >60 mL/min (ref >60)
GFR calc non Af Amer: >60 mL/min (ref >60)
Glucose, Bld: 86 mg/dL (ref 65–99)
Potassium: 3.4 mmol/L — ABNORMAL LOW (ref 3.5–5.1)
Sodium: 137 mmol/L (ref 135–145)
Total Bilirubin: 0.3 mg/dL (ref 0.3–1.2)
Total Protein: 7.4 g/dL (ref 6.5–8.1)

## 2016-10-02 LAB — URINALYSIS, ROUTINE W REFLEX MICROSCOPIC
Bilirubin Urine: NEGATIVE
Glucose, UA: NEGATIVE mg/dL
Ketones, ur: NEGATIVE mg/dL
Leukocytes, UA: NEGATIVE
Nitrite: NEGATIVE
Protein, ur: NEGATIVE mg/dL
Specific Gravity, Urine: 1.012 (ref 1.005–1.030)
WBC, UA: NONE SEEN WBC/hpf (ref 0–5)
pH: 6 (ref 5.0–8.0)

## 2016-10-02 MED ORDER — SODIUM CHLORIDE 0.9 % IV BOLUS (SEPSIS)
1000.0000 mL | Freq: Once | INTRAVENOUS | Status: AC
  Administered 2016-10-03: 1000 mL via INTRAVENOUS

## 2016-10-02 MED ORDER — SODIUM CHLORIDE 0.9 % IV BOLUS (SEPSIS)
1000.0000 mL | Freq: Once | INTRAVENOUS | Status: AC
  Administered 2016-10-02: 1000 mL via INTRAVENOUS

## 2016-10-02 NOTE — ED Notes (Signed)
Pt dizzy and sob walking to the bathroom

## 2016-10-02 NOTE — ED Provider Notes (Signed)
[facility] DEPT Provider Note   CSN: [id number] Arrival date & time: 10/02/16  2143   By signing my name below, I, Eunice Blase, attest that this documentation has been prepared under the direction and in the presence of Ezequiel Essex, MD. Electronically signed, Eunice Blase, ED Scribe. 10/03/16. 1:42 AM.   History   Chief Complaint Chief Complaint  Patient presents with  . Shortness of Breath   The history is provided by the patient and medical records. No language interpreter was used.    HPI Comments: Kathy Zimmerman is a 26 y.o. female with Hx of asthma who presents to the Emergency Department complaining of chest pain and SOB today. She reports increased blood pressure when standing, lightheadedness, headache, chest tightness, cough, ear pain, nausea and diarrhea. She also reports vaginal bleeding, but she is unsure if it is related to her period, as she notes it would be the 8th day of her period. Pt notes she was seen for similar on 09/25/2016 with sharp chest pain associated with anxiety, but this episode is achy and feels similar to asthma and she does not have an inhaler at home. She states her chest pain is exacerbated with deep breathing. Pt notes she delivered on 08/29/2016 via C-section as she experienced preeclampsia during the pregnancy. She notes she took baby aspirin during her pregnancy. She states she is not on blood pressure medications now, but she was last on blood pressure medications 2 weeks ago, on which she was started after the pregnancy. G2/P2/O0.  Pt notes Hx of anxiety, and she states that she takes zoloft 150 mg, and xanex. She further further reports she will be Rx'ed clonopin when she finishes her current course of xanex. She also notes she takes baby aspirin at home. Pt denies fever, vomit and leg swelling.   Past Medical History:  Diagnosis Date  . Anxiety   . Asthma   . IBS (irritable bowel syndrome)   . Panic attack   . Preeclampsia      Patient Active Problem List   Diagnosis Date Noted  . Hyperemesis gravidarum before end of [redacted] week gestation, dehydration 03/30/2016  . Supervision of normal pregnancy 03/20/2016  . Previous cesarean delivery, antepartum 03/20/2016  . Hx of severe preeclampsia, prior pregnancy, currently pregnant 03/20/2016  . Rh negative state in antepartum period 03/20/2016  . Short interval between pregnancies affecting pregnancy, antepartum 03/20/2016  . Atypical chest pain 05/31/2015  . Anxiety 04/11/2015  . Retinal detachment of left eye with retinal break 08/08/2011    Class: Acute    Past Surgical History:  Procedure Laterality Date  . CESAREAN SECTION N/A 04/10/2015   Procedure: CESAREAN SECTION;  Surgeon: Crawford Givens, MD;  Location: [facility];  Service: Obstetrics;  Laterality: N/A;  . EYE SURGERY     laser  . SCLERAL BUCKLE  08/09/2011   Procedure: SCLERAL BUCKLE;  Surgeon: Clent Demark Rankin;  Location: [facility];  Service: Ophthalmology;  Laterality: Left;  with cryo  . TENDON REPAIR      OB History    Gravida Para Term Preterm AB Living   2 1   1   1    SAB TAB Ectopic Multiple Live Births         0 1       Home Medications    Prior to Admission medications   Medication Sig Start Date End Date Taking? Authorizing Provider  ALPRAZolam Duanne Moron) 1 MG tablet Take 1 tablet (1 mg total) by mouth  every 8 (eight) hours as needed for anxiety. 09/14/16  Yes Jonnie Kind, MD  aspirin EC 81 MG tablet Take 81 mg by mouth daily.   Yes Historical Provider, MD  clonazePAM (KLONOPIN) 0.5 MG tablet Take 0.5 mg by mouth 2 (two) times daily.   Yes Historical Provider, MD  medroxyPROGESTERone (DEPO-PROVERA) 150 MG/ML injection Inject 150 mg into the muscle every 3 (three) months.   Yes Historical Provider, MD  sertraline (ZOLOFT) 100 MG tablet Take 150 mg by mouth daily.    Yes Historical Provider, MD  acetaminophen (TYLENOL) 500 MG tablet Take 500 mg by mouth every 6 (six) hours as needed for moderate  pain.     Historical Provider, MD  acetaminophen-codeine (TYLENOL #3) 300-30 MG tablet Take 2 tablets by mouth every 4 (four) hours as needed for moderate pain. Patient not taking: Reported on 10/02/2016 09/14/16   Jonnie Kind, MD  ferrous sulfate 325 (65 FE) MG tablet Take 1 tablet (325 mg total) by mouth 2 (two) times daily with a meal. Patient not taking: Reported on 09/25/2016 08/08/16   Roma Schanz, CNM  hydrochlorothiazide (HYDRODIURIL) 25 MG tablet Take 1 tablet (25 mg total) by mouth daily. Patient not taking: Reported on 09/16/2016 09/13/16   Elmyra Ricks Pisciotta, PA-C  ibuprofen (ADVIL,MOTRIN) 600 MG tablet Take 1 tablet (600 mg total) by mouth every 6 (six) hours as needed. Patient not taking: Reported on 09/25/2016 09/16/16   Julianne Rice, MD  lidocaine (LMX) 4 % cream Apply 1 application topically 3 (three) times daily as needed. Patient not taking: Reported on 09/16/2016 09/13/16   Elmyra Ricks Pisciotta, PA-C  ondansetron (ZOFRAN) 8 MG tablet Take 1 tablet (8 mg total) by mouth every 8 (eight) hours as needed for nausea or vomiting. Patient not taking: Reported on 09/14/2016 07/18/16   Christin Fudge, CNM    Family History Family History  Problem Relation Age of Onset  . Depression Father   . Depression Sister   . Mental illness Sister   . Depression Maternal Grandmother   . Mental illness Maternal Grandmother   . Thyroid disease Maternal Grandmother   . Heart disease Maternal Grandfather   . Hypertension Maternal Grandfather   . Hyperlipidemia Maternal Grandfather   . CAD Maternal Grandfather   . Thyroid disease Other   . CAD Other     Social History Social History  Substance Use Topics  . Smoking status: Never Smoker  . Smokeless tobacco: Never Used  . Alcohol use No     Allergies   Sulfa antibiotics   Review of Systems Review of Systems A complete 10 system review of systems was obtained and all systems are negative except as noted in the HPI and PMH.     Physical Exam Updated Vital Signs BP 130/88 (BP Location: Left Arm)   Pulse 99   Temp 98.3 F (36.8 C) (Oral)   Resp 15   Ht 5\' 6"  (1.676 m)   Wt 165 lb (74.8 kg)   LMP 09/24/2016 (Exact Date)   SpO2 98%   Breastfeeding? No   BMI 26.63 kg/m   Physical Exam  Constitutional: She is oriented to person, place, and time. She appears well-developed and well-nourished. No distress.  HENT:  Head: Normocephalic and atraumatic.  Mouth/Throat: Oropharynx is clear and moist. No oropharyngeal exudate.  Eyes: Conjunctivae and EOM are normal. Pupils are equal, round, and reactive to light.  Neck: Normal range of motion. Neck supple.  No meningismus.  Cardiovascular: Normal rate,  regular rhythm, normal heart sounds and intact distal pulses.   No murmur heard. Trace pedal edema.  Pulmonary/Chest: Effort normal. No respiratory distress. She has no wheezes. She has rales. She exhibits no tenderness.  Bibasilar rales  Abdominal: Soft. There is no tenderness. There is no rebound and no guarding.  Musculoskeletal: Normal range of motion. She exhibits no edema or tenderness.  Neurological: She is alert and oriented to person, place, and time. No cranial nerve deficit. She exhibits normal muscle tone. Coordination normal.   5/5 strength throughout. CN 2-12 intact.Equal grip strength.   Skin: Skin is warm.  Psychiatric: Her behavior is normal. Her mood appears anxious.  Nursing note and vitals reviewed.  ED Treatments / Results  DIAGNOSTIC STUDIES: Oxygen Saturation is 98% on RA, normal by my interpretation.    COORDINATION OF CARE: 1:42 AM Discussed treatment plan with pt at bedside and pt agreed to plan.  Labs (all labs ordered are listed, but only abnormal results are displayed) Labs Reviewed  CBC WITH DIFFERENTIAL/PLATELET - Abnormal; Notable for the following:       Result Value   Hemoglobin 10.7 (*)    HCT 32.8 (*)    All other components within normal limits  COMPREHENSIVE  METABOLIC PANEL - Abnormal; Notable for the following:    Potassium 3.4 (*)    Calcium 8.8 (*)    AST 13 (*)    ALT 11 (*)    All other components within normal limits  URINALYSIS, ROUTINE W REFLEX MICROSCOPIC - Abnormal; Notable for the following:    Hgb urine dipstick LARGE (*)    Bacteria, UA RARE (*)    All other components within normal limits  RAPID STREP SCREEN (NOT AT [facility])  CULTURE, GROUP A STREP [facility])  PROTEIN / CREATININE RATIO, URINE  TROPONIN I    EKG  EKG Interpretation  Date/Time:  Monday October 02 2016 23:19:06 EST Ventricular Rate:  91 PR Interval:    QRS Duration: 88 QT Interval:  361 QTC Calculation: 445 R Axis:   64 Text Interpretation:  Sinus rhythm No significant change was found Confirmed by Wyvonnia Dusky  MD, Devann Cribb [phone]) on 10/03/2016 12:18:51 AM       Radiology Ct Angio Chest Pe W And/or Wo Contrast  Result Date: 10/03/2016 CLINICAL DATA:  Acute onset of generalized chest status and shortness of breath. High blood pressure. Lightheadedness, headache, and cough. Nausea and diarrhea. Initial encounter. EXAM: CT ANGIOGRAPHY CHEST WITH CONTRAST TECHNIQUE: Multidetector CT imaging of the chest was performed using the standard protocol during bolus administration of intravenous contrast. Multiplanar CT image reconstructions and MIPs were obtained to evaluate the vascular anatomy. CONTRAST:  100 mL of Isovue 370 IV contrast COMPARISON:  Chest radiograph performed 09/25/2016, and CTA of the chest performed 09/11/2016 FINDINGS: Cardiovascular:  There is no evidence of pulmonary embolus. The heart is unremarkable in appearance. The thoracic aorta is unremarkable. No calcific atherosclerotic disease is seen. The great vessels are grossly unremarkable. Mediastinum/Nodes: The mediastinum is unremarkable in appearance. No mediastinal lymphadenopathy is seen. No pericardial effusion is identified. Residual thymic tissue is within normal limits. The visualized portions of  the thyroid gland are unremarkable. No acute osseous abnormalities are seen. Lungs/Pleura: Patchy bilateral lower lobe airspace opacification raises concern for multifocal pneumonia. No pleural effusion or pneumothorax is seen. No dominant masses are identified. Upper Abdomen: The visualized portions of the liver and spleen are unremarkable. The visualized portions of the pancreas, adrenal glands and left kidney are unremarkable. Musculoskeletal:  No acute osseous abnormalities are identified. The visualized musculature is unremarkable in appearance. Review of the MIP images confirms the above findings. IMPRESSION: 1. No evidence of pulmonary embolus. 2. Patchy bilateral lower lobe airspace opacification raises concern for multifocal pneumonia. Electronically Signed   By: Garald Balding M.D.   On: 10/03/2016 01:57    Procedures Procedures (including critical care time)  Medications Ordered in ED Medications  sodium chloride 0.9 % bolus 1,000 mL (1,000 mLs Intravenous New Bag/Given 10/03/16 0122)  iopamidol (ISOVUE-370) 76 % injection 100 mL (not administered)  sodium chloride 0.9 % bolus 1,000 mL (0 mLs Intravenous Stopped 10/03/16 0057)     Initial Impression / Assessment and Plan / ED Course  I have reviewed the triage vital signs and the nursing notes.  Pertinent labs & imaging results that were available during my care of the patient were reviewed by me and considered in my medical decision making (see chart for details).     Patient 4 weeks postpartum presenting with chest pain and shortness of breath onset today. Feels her heart rate elevate when she stands up. Pain is worse with deep breathing. Treated for preeclampsia prior to delivery.  Appears anxious, no distress, lungs clear, no wheezing.  Blood pressure mildly elevated but below [id number] systolic. Urine protein creatinine ratio normal. Doubt preeclampsia.  Orthostatics are positive by heart rate. Elevates to 114 with standing. With  chest pain and shortness of breath, pleuritic chest pain and postpartum state we'll obtain CT PE to evaluate for pulmonary embolism.  CT shows no PE but does show bilateral lower lobe airspace disease concerning for multifocal pneumonia. Patient denies cough or fever.  Patient appears well. Her blood pressure and heart rate are normal. She is afebrile. Oxygenation is 99% on room air. Lactate is normal. Blood cultures are sent. Patient started on IV Levaquin.  CT scan from January 1 showed tiny area of possible pneumonia right lower lobe. This appears to be much progressed. Unclear why patient has bilateral multifocal pneumonia. However she appears nontoxic and well with no oxygen requirement.  Discussed with Dr. Shanon Brow who feels patient can be treated as an outpatient with IV Levaquin. Patient had recent negative HIV test in November. She is not breast-feeding.  Will treat with levaquin. She is ambulatory without desaturation and not hypoxic. She has a pulse ox at home. Follow up with PCP and OB.  Return to the ED with increased CP, SOB, or pulse ox reading below 92%.   BP 118/82   Pulse 75   Temp 99.5 F (37.5 C) (Rectal)   Resp 17   Ht 5\' 6"  (1.676 m)   Wt 165 lb (74.8 kg)   LMP 09/24/2016 (Exact Date)   SpO2 97%   Breastfeeding? No   BMI 26.63 kg/m   Final Clinical Impressions(s) / ED Diagnoses   Final diagnoses:  Multifocal pneumonia  Anxiety    New Prescriptions New Prescriptions   No medications on file   I personally performed the services described in this documentation, which was scribed in my presence. The recorded information has been reviewed and is accurate.    Ezequiel Essex, MD 10/03/16 [phone]

## 2016-10-02 NOTE — ED Triage Notes (Signed)
I am having chest pain and shortness of breath.  Having heaviness in my arms.  When I am resting my heart rate is in the 70s and when I stand up it goes into the 140s with my sats dropping to 92%.  Hurts a little when I breathe per pt.  They increased my Zoloft to 50 mg 2 days ago.  They put me on klonipin I took 2 days worth and have not had any for 2 days.  Patient is 4 weeks post partum, with a history of preeclampsia.  Had baby at 1 weeks.  Patient has noticed swelling in her hands.

## 2016-10-03 ENCOUNTER — Emergency Department (HOSPITAL_COMMUNITY): Payer: BLUE CROSS/BLUE SHIELD

## 2016-10-03 DIAGNOSIS — J189 Pneumonia, unspecified organism: Secondary | ICD-10-CM | POA: Diagnosis not present

## 2016-10-03 LAB — RAPID STREP SCREEN (MED CTR MEBANE ONLY): Streptococcus, Group A Screen (Direct): NEGATIVE

## 2016-10-03 LAB — PROTEIN / CREATININE RATIO, URINE
Creatinine, Urine: 106.14 mg/dL
Protein Creatinine Ratio: 0.09 mg/mg{Cre} (ref 0.00–0.15)
Total Protein, Urine: 10 mg/dL

## 2016-10-03 LAB — I-STAT CG4 LACTIC ACID, ED: Lactic Acid, Venous: 1.49 mmol/L (ref 0.5–1.9)

## 2016-10-03 LAB — TROPONIN I: Troponin I: <0.03 ng/mL (ref <0.03)

## 2016-10-03 MED ORDER — SODIUM CHLORIDE 0.9 % IV BOLUS (SEPSIS): 1000.0000 mL | Freq: Once | INTRAVENOUS | Status: DC

## 2016-10-03 MED ORDER — LEVOFLOXACIN 750 MG PO TABS: 750.0000 mg | ORAL_TABLET | Freq: Every day | ORAL | 0 refills | Status: DC

## 2016-10-03 MED ORDER — IOPAMIDOL (ISOVUE-370) INJECTION 76%
100.0000 mL | Freq: Once | INTRAVENOUS | Status: AC | PRN
  Administered 2016-10-03: 100 mL via INTRAVENOUS

## 2016-10-03 MED ORDER — ACETAMINOPHEN 325 MG PO TABS
650.0000 mg | ORAL_TABLET | Freq: Once | ORAL | Status: AC
  Administered 2016-10-03: 650 mg via ORAL
  Filled 2016-10-03: qty 2

## 2016-10-03 MED ORDER — LEVOFLOXACIN IN D5W 750 MG/150ML IV SOLN
750.0000 mg | Freq: Once | INTRAVENOUS | Status: AC
  Administered 2016-10-03: 750 mg via INTRAVENOUS
  Filled 2016-10-03: qty 150

## 2016-10-03 NOTE — Discharge Instructions (Signed)
Take antibiotics as prescribed. Follow-up with Dr. Nevada Crane and Dr. Glo Herring. Return to the ED if he develop difficulty breathing, chest pain, unable to eat or drink, oxygenation level below 92% consistently or any other concerns.

## 2016-10-03 NOTE — ED Notes (Signed)
2nd blood culture drawn by lab prior to antibiotic administration

## 2016-10-03 NOTE — ED Notes (Signed)
Pt O2 was 99/100 while ambulating

## 2016-10-05 ENCOUNTER — Ambulatory Visit: Payer: BLUE CROSS/BLUE SHIELD | Admitting: Advanced Practice Midwife

## 2016-10-05 LAB — CULTURE, GROUP A STREP (THRC)

## 2016-10-08 LAB — CULTURE, BLOOD (ROUTINE X 2)
Culture: NO GROWTH
Culture: NO GROWTH

## 2016-10-09 ENCOUNTER — Ambulatory Visit (HOSPITAL_COMMUNITY)
Admission: RE | Admit: 2016-10-09 | Discharge: 2016-10-09 | Disposition: A | Discharge status: Home / Self Care | Payer: BLUE CROSS/BLUE SHIELD | Source: Ambulatory Visit | Attending: Internal Medicine | Admitting: Internal Medicine

## 2016-10-09 ENCOUNTER — Other Ambulatory Visit (HOSPITAL_COMMUNITY): Payer: Self-pay | Admitting: Internal Medicine

## 2016-10-09 DIAGNOSIS — R05 Cough: Secondary | ICD-10-CM | POA: Diagnosis present

## 2016-10-09 DIAGNOSIS — R059 Cough, unspecified: Secondary | ICD-10-CM

## 2016-10-12 ENCOUNTER — Encounter: Payer: Self-pay | Admitting: Women's Health

## 2016-10-12 ENCOUNTER — Ambulatory Visit: Payer: BLUE CROSS/BLUE SHIELD | Admitting: Adult Health

## 2016-10-16 NOTE — Progress Notes (Signed)
Cardiology Office Note   Date:  10/17/2016   ID:  Kathy Zimmerman, DOB 1991/01/21, MRN [id number]  PCP:  Kathy Neighbors, MD  Cardiologist:   Jenkins Rouge, MD   No chief complaint on file.     History of Present Illness: Kathy Zimmerman is a 26 y.o. female who presents for tachycardia.  History of anxiety, panic attacks, IBS and asthma Subjectively patient feels pulse goes up to 150-170 when standing Reviewed notes from Dr Kathy Zimmerman office 10/09/16  And not postural with HR going from 90-92 with standing  After a few minutes 110-120 range. His notes indicate  Pre eclampsia.  Had c section on 08/29/16  Cough and had CXR 10/09/16 reviewed and NAD normal heart size and mediastinum CTA 1/23 With no coronary calcium no PE patchy bilateral lower lobe airspace disease. This resolved on f/u CXR  Review of labs do not see TSH  Enzymes negative Hct 35.9  No results found for: TSH  She is a bit anxious has been sedentary since tow kids [place] 17 m [place] 2 months   Both C sections and pre eclampsia with both   Denies ETOH / drugs    Has pulse ox has Asthma and ? Blood clot with first baby so she carries it with her. Notes HR increases easily with  Standing and exercise One episode of rapid increase to 160   Past Medical History:  Diagnosis Date  . Anxiety   . Asthma   . IBS (irritable bowel syndrome)   . Panic attack   . Preeclampsia     Past Surgical History:  Procedure Laterality Date  . CESAREAN SECTION N/A 04/10/2015   Procedure: CESAREAN SECTION;  Surgeon: Kathy Givens, MD;  Location: [facility];  Service: Obstetrics;  Laterality: N/A;  . EYE SURGERY     laser  . SCLERAL BUCKLE  08/09/2011   Procedure: SCLERAL BUCKLE;  Surgeon: Kathy Zimmerman;  Location: [facility];  Service: Ophthalmology;  Laterality: Left;  with cryo  . TENDON REPAIR       Current Outpatient Prescriptions  Medication Sig Dispense Refill  . acetaminophen (TYLENOL) 500 MG tablet Take 500 mg by mouth every 6 (six)  hours as needed for moderate pain.     Marland Kitchen ALPRAZolam (XANAX) 1 MG tablet Take 1 tablet (1 mg total) by mouth every 8 (eight) hours as needed for anxiety. 60 tablet 1  . aspirin EC 81 MG tablet Take 81 mg by mouth daily.    . clonazePAM (KLONOPIN) 0.5 MG tablet Take 0.5 mg by mouth 2 (two) times daily.    . medroxyPROGESTERone (DEPO-PROVERA) 150 MG/ML injection Inject 150 mg into the muscle every 3 (three) months.    . sertraline (ZOLOFT) 100 MG tablet Take 150 mg by mouth daily.     Marland Kitchen atenolol (TENORMIN) 25 MG tablet Take 1 tablet (25 mg total) by mouth 2 (two) times daily. 180 tablet 3   No current facility-administered medications for this visit.     Allergies:   Sulfa antibiotics    Social History:  The patient  reports that she has never smoked. She has never used smokeless tobacco. She reports that she does not drink alcohol or use drugs.   Family History:  The patient's family history includes CAD in her maternal grandfather and other; Depression in her father, maternal grandmother, and sister; Heart disease in her maternal grandfather; Hyperlipidemia in her maternal grandfather; Hypertension in her maternal grandfather; Mental illness in her  maternal grandmother and sister; Thyroid disease in her maternal grandmother and other.    ROS:  Please see the history of present illness.   Otherwise, review of systems are positive for none.   All other systems are reviewed and negative.    PHYSICAL EXAM: VS:  BP 125/88 (BP Location: Left Arm)   Pulse 95   Ht 5\' 6"  (1.676 m)   Wt 161 lb (73 kg)   LMP 10/15/2016   SpO2 96%   BMI 25.99 kg/m  , BMI Body mass index is 25.99 kg/m. Affect appropriate Healthy:  appears stated age 62: normal Neck supple with no adenopathy JVP normal no bruits no thyromegaly Lungs clear with no wheezing and good diaphragmatic motion Heart:  S1/S2 no murmur, no rub, gallop or click PMI normal Abdomen: benighn, BS positve, no tenderness, no AAA no bruit.   No HSM or HJR Distal pulses intact with no bruits No edema Neuro non-focal Skin warm and dry No muscular weakness    EKG:  10/02/16  SR rate 91 normal no pre excitation    Recent Labs: 10/02/2016: ALT 11; BUN 10; Creatinine, Ser 0.74; Hemoglobin 10.7; Platelets 269; Potassium 3.4; Sodium 137    Lipid Panel No results found for: CHOL, TRIG, HDL, CHOLHDL, VLDL, LDLCALC, LDLDIRECT    Wt Readings from Last 3 Encounters:  10/17/16 161 lb (73 kg)  10/02/16 165 lb (74.8 kg)  09/25/16 164 lb (74.4 kg)      Other studies Reviewed: Additional studies/ records that were reviewed today include: Notes from Dr Kathy Zimmerman 10/08/16  .    ASSESSMENT AND PLAN:  1.  Tachycardia Possible form of POTS but BP is fine start low dose atenolol f/u ETT and see what HR response to exercise is 2. Post Partum f/u echo to r/o DCM not breast feeding ok to use beta blocker 3. Pulmonary history of asthma no active wheezing normal lung exam  4. Anxiety/Depression  Follow avoid sympathomimetic drugs    Current medicines are reviewed at length with the patient today.  The patient does not have concerns regarding medicines.  The following changes have been made:  no change  Labs/ tests ordered today include: Echo TSH/T4 and 48 hour holter   Orders Placed This Encounter  Procedures  . Exercise Tolerance Test  . ECHOCARDIOGRAM COMPLETE     Disposition:   FU with [place] PRN      Signed, Jenkins Rouge, MD  10/17/2016 1:56 PM    [facility], [place], [place]  [address] Phone: [phone]; Fax: [phone]

## 2016-10-17 ENCOUNTER — Ambulatory Visit: Payer: BLUE CROSS/BLUE SHIELD | Admitting: Women's Health

## 2016-10-17 ENCOUNTER — Encounter: Payer: Self-pay | Admitting: Cardiovascular Disease

## 2016-10-17 ENCOUNTER — Ambulatory Visit (INDEPENDENT_AMBULATORY_CARE_PROVIDER_SITE_OTHER): Payer: BLUE CROSS/BLUE SHIELD | Admitting: Cardiovascular Disease

## 2016-10-17 VITALS — BP 125/88 | HR 95 | Ht 66.0 in | Wt 161.0 lb

## 2016-10-17 DIAGNOSIS — I951 Orthostatic hypotension: Secondary | ICD-10-CM | POA: Diagnosis not present

## 2016-10-17 DIAGNOSIS — R000 Tachycardia, unspecified: Secondary | ICD-10-CM

## 2016-10-17 DIAGNOSIS — G90A Postural orthostatic tachycardia syndrome (POTS): Secondary | ICD-10-CM

## 2016-10-17 DIAGNOSIS — I498 Other specified cardiac arrhythmias: Secondary | ICD-10-CM

## 2016-10-17 MED ORDER — ATENOLOL 25 MG PO TABS: 25.0000 mg | ORAL_TABLET | Freq: Two times a day (BID) | ORAL | 3 refills | Status: DC

## 2016-10-17 NOTE — Patient Instructions (Signed)
Medication Instructions:  START ATENOLOL 25 MG DAILY   Labwork: NONE  Testing/Procedures: Your physician has requested that you have an exercise tolerance test. For further information please visit [email or web address]. Please also follow instruction sheet, as given.  Your physician has requested that you have an echocardiogram. Echocardiography is a painless test that uses sound waves to create images of your heart. It provides your doctor with information about the size and shape of your heart and how well your heart's chambers and valves are working. This procedure takes approximately one hour. There are no restrictions for this procedure.    Follow-Up: Your physician recommends that you schedule a follow-up appointment in:6 - 8 WEEKS   Any Other Special Instructions Will Be Listed Below (If Applicable).     If you need a refill on your cardiac medications before your next appointment, please call your pharmacy.

## 2016-10-18 ENCOUNTER — Ambulatory Visit (INDEPENDENT_AMBULATORY_CARE_PROVIDER_SITE_OTHER): Payer: BLUE CROSS/BLUE SHIELD | Admitting: Women's Health

## 2016-10-18 ENCOUNTER — Encounter: Payer: Self-pay | Admitting: Women's Health

## 2016-10-18 DIAGNOSIS — F419 Anxiety disorder, unspecified: Secondary | ICD-10-CM

## 2016-10-18 DIAGNOSIS — Z98891 History of uterine scar from previous surgery: Secondary | ICD-10-CM

## 2016-10-18 NOTE — Progress Notes (Signed)
Subjective:    Kathy Zimmerman is a 26 y.o. [id number] Caucasian female who presents for a postpartum visit. She is 7 weeks postpartum following a repeat cesarean section, low transverse incision at 33.3 gestational weeks at [facility] d/t severe pre-e and NICU full. Anesthesia: spinal. I have fully reviewed the prenatal and intrapartum course. Postpartum course has been pneumonia, tachycardia- seeing cardiologist- was put on atenolol to lower hr, goes Friday for stress test and echocardiogram. Baby's course has been complicated by short NICU stay, only like 11-12days, doing well now. Baby is feeding by bottle. Bleeding period started 2/5. Bowel function is normal. Bladder function is normal. Patient is not sexually active. Last sexual activity: prior to birth of baby. Contraception method is got 1st depo when d/c'd from hospital- was told would be due again 3/9. Husband plans vasectomy. Postpartum depression screening: positive. Score 17.  Denies depression, just very anxious, always worried something is wrong with her and she is going to die, was like this after last baby, but was worse then. Is on Zoloft, Klonopin, and Xanax all managed by [facility]. Goes back soon for recheck of meds. Feels like they haven't helped. Saw counselor once there since delivery- who told her she was doing better than after last baby.   Last pap March 2016 and was neg.  The following portions of the patient's history were reviewed and updated as appropriate: allergies, current medications, past medical history, past surgical history and problem list.  Review of Systems Pertinent items are noted in HPI.   Vitals:   10/18/16 1642  BP: 104/70  Pulse: 72  Weight: 164 lb (74.4 kg)  Height: 5\' 6"  (1.676 m)   Patient's last menstrual period was 10/15/2016.  Objective:   General:  alert, cooperative and no distress   Breasts:  deferred, no complaints  Lungs: clear to auscultation bilaterally  Heart:  regular rate  and rhythm  Abdomen: soft, nontender, c/s incision well-healed   Vulva: normal  Vagina: normal vagina  Cervix:  closed  Corpus: Well-involuted  Adnexa:  Non-palpable  Rectal Exam: No hemorrhoids        Assessment:   Postpartum exam 7 wks s/p RLTCS at 33wks d/t severe pre-e Bottlefeeding Mod-severe anxiety- managed by psychiatrist in [place] Positional tachycardia- being worked up by cardiology Depression screening Contraception counseling   Plan:  Contraception: Depo-Provera injections Follow up in: 3/9 for depo, then 30yr for pap & physical Keep appts w/ psychiatry and cardiology  Tawnya Crook CNM, [facility] 10/18/2016 5:02 PM

## 2016-10-20 ENCOUNTER — Ambulatory Visit (HOSPITAL_COMMUNITY)
Admission: RE | Admit: 2016-10-20 | Discharge: 2016-10-20 | Disposition: A | Discharge status: Home / Self Care | Payer: BLUE CROSS/BLUE SHIELD | Source: Ambulatory Visit | Attending: Cardiovascular Disease | Admitting: Cardiovascular Disease

## 2016-10-20 DIAGNOSIS — R000 Tachycardia, unspecified: Secondary | ICD-10-CM | POA: Insufficient documentation

## 2016-10-20 DIAGNOSIS — I498 Other specified cardiac arrhythmias: Secondary | ICD-10-CM

## 2016-10-20 DIAGNOSIS — G90A Postural orthostatic tachycardia syndrome (POTS): Secondary | ICD-10-CM

## 2016-10-20 DIAGNOSIS — I951 Orthostatic hypotension: Secondary | ICD-10-CM | POA: Insufficient documentation

## 2016-10-20 LAB — EXERCISE TOLERANCE TEST
Estimated workload: 10.1 METS
Exercise duration (min): 7 min
Exercise duration (sec): 9 s
MPHR: 196 {beats}/min
Peak HR: 190 {beats}/min
Percent HR: 96 %
RPE: 16
Rest HR: 88 {beats}/min

## 2016-10-20 NOTE — Progress Notes (Signed)
*  PRELIMINARY RESULTS* Echocardiogram 2D Echocardiogram has been performed.  Leavy Cella 10/20/2016, 8:56 AM

## 2016-10-26 ENCOUNTER — Other Ambulatory Visit (HOSPITAL_COMMUNITY): Payer: BLUE CROSS/BLUE SHIELD

## 2016-10-26 ENCOUNTER — Encounter (HOSPITAL_COMMUNITY): Payer: BLUE CROSS/BLUE SHIELD

## 2016-11-01 DIAGNOSIS — F319 Bipolar disorder, unspecified: Secondary | ICD-10-CM | POA: Insufficient documentation

## 2016-11-13 ENCOUNTER — Telehealth: Payer: Self-pay | Admitting: Adult Health

## 2016-11-13 NOTE — Telephone Encounter (Signed)
dont think BP is the issue

## 2016-11-13 NOTE — Telephone Encounter (Signed)
Pt would like the nurse to give her a call concerning her BP meds, she thinks it may be dropping her BP to low

## 2016-11-13 NOTE — Telephone Encounter (Signed)
BP running 93/46, HR 74 this was today and was  lightheaded,sx's lasted over an hour. BP now 118/90 HR 99   Has headache but relates to elevated BP now     Just started on lithium for PPD, encouraged her to drink lots of fluids and f/u with provider that wrote the lithium rx. She was concerned BB was cause   Please advise

## 2016-11-13 NOTE — Telephone Encounter (Signed)
I LM for patient of Dr Kyla Balzarine message, ok per pt

## 2016-11-15 ENCOUNTER — Ambulatory Visit: Payer: BLUE CROSS/BLUE SHIELD

## 2016-11-17 ENCOUNTER — Ambulatory Visit: Payer: BLUE CROSS/BLUE SHIELD

## 2016-11-20 ENCOUNTER — Ambulatory Visit: Payer: BLUE CROSS/BLUE SHIELD

## 2016-11-21 ENCOUNTER — Encounter: Payer: Self-pay | Admitting: *Deleted

## 2016-11-21 ENCOUNTER — Ambulatory Visit (INDEPENDENT_AMBULATORY_CARE_PROVIDER_SITE_OTHER): Payer: BLUE CROSS/BLUE SHIELD | Admitting: *Deleted

## 2016-11-21 DIAGNOSIS — Z308 Encounter for other contraceptive management: Secondary | ICD-10-CM

## 2016-11-21 DIAGNOSIS — Z3202 Encounter for pregnancy test, result negative: Secondary | ICD-10-CM | POA: Diagnosis not present

## 2016-11-21 DIAGNOSIS — Z3042 Encounter for surveillance of injectable contraceptive: Secondary | ICD-10-CM | POA: Diagnosis not present

## 2016-11-21 LAB — POCT URINE PREGNANCY: Preg Test, Ur: NEGATIVE

## 2016-11-21 MED ORDER — MEDROXYPROGESTERONE ACETATE 150 MG/ML IM SUSP
150.0000 mg | Freq: Once | INTRAMUSCULAR | Status: AC
  Administered 2016-11-21: 150 mg via INTRAMUSCULAR

## 2016-11-21 NOTE — Progress Notes (Signed)
Pt here for Depo. Pt tolerated shot well. Return in 12 weeks for next shot. JSY 

## 2016-11-22 ENCOUNTER — Telehealth: Payer: Self-pay | Admitting: Cardiovascular Disease

## 2016-11-22 NOTE — Telephone Encounter (Signed)
pT Made aware, Copy to pcp.

## 2016-11-22 NOTE — Telephone Encounter (Signed)
Pt states that since starting atenolol 25 mg bid she has been dizzy, nauseas, and very tired.She was trying to see if she could get use to the medication, but it is not getting any better. She want's to know if she can take a lower dose to see if that helps. Please advise.

## 2016-11-22 NOTE — Telephone Encounter (Signed)
Patient states that she is having side effects to medications. Wants to know what to do. / tg

## 2016-11-22 NOTE — Telephone Encounter (Signed)
Can take it once a day or 1/2 tablet bid If not helpful can call in bystolic 5 mg daily

## 2016-12-12 ENCOUNTER — Encounter: Payer: Self-pay | Admitting: Adult Health

## 2016-12-12 ENCOUNTER — Ambulatory Visit (INDEPENDENT_AMBULATORY_CARE_PROVIDER_SITE_OTHER): Payer: BLUE CROSS/BLUE SHIELD | Admitting: Adult Health

## 2016-12-12 VITALS — BP 110/76 | HR 73 | Ht 66.0 in | Wt 166.0 lb

## 2016-12-12 DIAGNOSIS — I952 Hypotension due to drugs: Secondary | ICD-10-CM | POA: Diagnosis not present

## 2016-12-12 DIAGNOSIS — R002 Palpitations: Secondary | ICD-10-CM | POA: Diagnosis not present

## 2016-12-12 NOTE — Progress Notes (Signed)
Cardiology Office Note   Date:  12/12/2016   ID:  Kathy Zimmerman, DOB 1990/12/29, MRN [id number]  PCP:  Wende Neighbors, MD  Cardiologist: Lamar Sprinkles, NP   Chief Complaint  Patient presents with  . Palpitations  . Hypertension    History of Present Illness: Kathy Zimmerman is a 26 y.o. female who presents for ongoing assessment and management of tachycardia. She also has a history of panic disorder and anxiety, IBS and asthma. Was last seen in the office by Dr. Johnsie Cancel on 10/17/2016 with complaints of racing HR. There was concern for POTS. Was started on low dose atenolol and scheduled for ETT.    No diagnostic ST segment changes in the setting of lead motion artifact. Duke treadmill score is 3, technically intermediate risk due to nonlimiting chest pain, but no diagnostic ST segment changes or arrhythmias.  Blood pressure demonstrated a normal response to exercise.  Started on Bystolic after experiencing hypotenison when being started on Lithium by psychiatry and stopped atenolol. However she stopped lithium due to side effects of fatigue. Has now gone back on atenolol and is taking 12.5 mg BID.  She is feeling much better. Trying to avoid caffeine and drink more water.  She occasionally has racing HR, but usually goes away with rest or deep breathing.   Past Medical History:  Diagnosis Date  . Anxiety   . Asthma   . IBS (irritable bowel syndrome)   . Panic attack   . Preeclampsia     Past Surgical History:  Procedure Laterality Date  . CESAREAN SECTION N/A 04/10/2015   Procedure: CESAREAN SECTION;  Surgeon: Crawford Givens, MD;  Location: [facility];  Service: Obstetrics;  Laterality: N/A;  . EYE SURGERY     laser  . SCLERAL BUCKLE  08/09/2011   Procedure: SCLERAL BUCKLE;  Surgeon: Clent Demark Rankin;  Location: [facility];  Service: Ophthalmology;  Laterality: Left;  with cryo  . TENDON REPAIR       Current Outpatient Prescriptions  Medication Sig Dispense Refill  . atenolol  (TENORMIN) 25 MG tablet Take 1 tablet (25 mg total) by mouth 2 (two) times daily. 180 tablet 3  . cetirizine (ZYRTEC) 10 MG tablet Take 10 mg by mouth as needed.     . medroxyPROGESTERone (DEPO-PROVERA) 150 MG/ML injection Inject 150 mg into the muscle every 3 (three) months.    Marland Kitchen PROAIR HFA 108 (90 Base) MCG/ACT inhaler INHALE 1 TO 2 PUFFS PO Q 4 TO 6 H PRN FOR SOB AND WHEEZING  3  . sertraline (ZOLOFT) 100 MG tablet Take 150 mg by mouth daily.      No current facility-administered medications for this visit.     Allergies:   Sulfa antibiotics    Social History:  The patient  reports that she has never smoked. She has never used smokeless tobacco. She reports that she does not drink alcohol or use drugs.   Family History:  The patient's family history includes CAD in her maternal grandfather and other; Depression in her father, maternal grandmother, and sister; Heart disease in her maternal grandfather; Hyperlipidemia in her maternal grandfather; Hypertension in her maternal grandfather; Mental illness in her maternal grandmother and sister; Thyroid disease in her maternal grandmother and other.    ROS: All other systems are reviewed and negative. Unless otherwise mentioned in H&P    PHYSICAL EXAM: VS:  Ht 5\' 6"  (1.676 m)   Wt 166 lb (75.3 kg)   BMI 26.79 kg/m  ,  BMI Body mass index is 26.79 kg/m. GEN: Well nourished, well developed, in no acute distress  HEENT: normal  Neck: no JVD, carotid bruits, or masses Cardiac: RRR; no murmurs, rubs, or gallops,no edema  Respiratory:  clear to auscultation bilaterally, normal work of breathing GI: soft, nontender, nondistended, + BS MS: no deformity or atrophy  Skin: warm and dry, no rash Neuro:  Strength and sensation are intact Psych: euthymic mood, full affect   Recent Labs: 10/02/2016: ALT 11; BUN 10; Creatinine, Ser 0.74; Hemoglobin 10.7; Platelets 269; Potassium 3.4; Sodium 137    Lipid Panel No results found for: CHOL, TRIG,  HDL, CHOLHDL, VLDL, LDLCALC, LDLDIRECT    Wt Readings from Last 3 Encounters:  12/12/16 166 lb (75.3 kg)  10/18/16 164 lb (74.4 kg)  10/17/16 161 lb (73 kg)      Other studies Reviewed: Echocardiogram 2016-11-05 Left ventricle: The cavity size was normal. Wall thickness was   increased in a pattern of mild LVH. Systolic function was normal.   The estimated ejection fraction was in the range of 55% to 60%.   Wall motion was normal; there were no regional wall motion   abnormalities. Left ventricular diastolic function parameters   were normal. - Right atrium: Central venous pressure (est): 3 mm Hg. - Atrial septum: No defect or patent foramen ovale was identified. - Tricuspid valve: There was trivial regurgitation. - Pulmonary arteries: Systolic pressure could not be accurately   estimated. - Pericardium, extracardiac: There was no pericardial effusion.   ASSESSMENT AND PLAN:  1. Palpitations:  Much better controlled on atenolol. She is taking 12.5 mg BID now with only rare incidences of rapid HR. This is usually relieved with rest or deep breathing. I have talked to her about caffeine intake and fluid intake. She is to make sure, especially over the warmer months that she needs to stay hydrated to avoid hypotension.   2.  Hypotension: Occurring with use of Lithium Now normal. She is to continue hydration and avoid caffeine.    Current medicines are reviewed at length with the patient today.    Labs/ tests ordered today include: No orders of the defined types were placed in this encounter.    Disposition:   FU with 6 months  Signed, Jory Sims, NP  12/12/2016 1:10 PM    [facility] [address], [place], [place] [address] Phone: [phone]; Fax: [phone]

## 2016-12-12 NOTE — Patient Instructions (Signed)
Your physician wants you to follow-up in: 6 Months with Dr. Nishan. You will receive a reminder letter in the mail two months in advance. If you don't receive a letter, please call our office to schedule the follow-up appointment.  Your physician recommends that you continue on your current medications as directed. Please refer to the Current Medication list given to you today.  If you need a refill on your cardiac medications before your next appointment, please call your pharmacy.  Thank you for choosing [facility]!   

## 2016-12-12 NOTE — Progress Notes (Signed)
Name: Kathy Zimmerman    DOB: Apr 04, 1991  Age: 26 y.o.  MR#: [id number]       PCP:  Wende Neighbors, MD      Insurance: Payor: [place] / Plan: BCBS OTHER / Product Type: *No Product type* /   CC:    Chief Complaint  Patient presents with  . Palpitations  . Hypertension    VS Vitals:   12/12/16 1309  BP: 110/76  Pulse: 73  SpO2: 99%  Weight: 166 lb (75.3 kg)  Height: 5\' 6"  (1.676 m)    Weights Current Weight  12/12/16 166 lb (75.3 kg)  10/18/16 164 lb (74.4 kg)  10/17/16 161 lb (73 kg)    Blood Pressure  BP Readings from Last 3 Encounters:  12/12/16 110/76  10/18/16 104/70  10/17/16 125/88     Admit date:  (Not on file) Last encounter with RMR:  11/13/2016   Allergy Sulfa antibiotics  Current Outpatient Prescriptions  Medication Sig Dispense Refill  . atenolol (TENORMIN) 25 MG tablet Take 1 tablet (25 mg total) by mouth 2 (two) times daily. 180 tablet 3  . cetirizine (ZYRTEC) 10 MG tablet Take 10 mg by mouth as needed.     . medroxyPROGESTERone (DEPO-PROVERA) 150 MG/ML injection Inject 150 mg into the muscle every 3 (three) months.    Marland Kitchen PROAIR HFA 108 (90 Base) MCG/ACT inhaler INHALE 1 TO 2 PUFFS PO Q 4 TO 6 H PRN FOR SOB AND WHEEZING  3  . sertraline (ZOLOFT) 100 MG tablet Take 150 mg by mouth daily.     Marland Kitchen topiramate (TOPAMAX) 25 MG tablet Take 25 mg by mouth 2 (two) times daily.     No current facility-administered medications for this visit.     Discontinued Meds:   There are no discontinued medications.  Patient Active Problem List   Diagnosis Date Noted  . History of 2 cesarean sections 03/20/2016  . Hx of severe preeclampsia 03/20/2016  . Atypical chest pain 05/31/2015  . Anxiety 04/11/2015  . Retinal detachment of left eye with retinal break 08/08/2011    Class: Acute    LABS    Component Value Date/Time   NA 137 10/02/2016 2304   NA 137 09/25/2016 2000   NA 136 09/13/2016 0730   NA 136 08/23/2016 1503   NA 134 05/09/2016 1048   NA 138  03/20/2016 1557   K 3.4 (L) 10/02/2016 2304   K 3.5 09/25/2016 2000   K 4.0 09/13/2016 0730   CL 105 10/02/2016 2304   CL 105 09/25/2016 2000   CL 106 09/13/2016 0730   CO2 26 10/02/2016 2304   CO2 24 09/25/2016 2000   CO2 21 (L) 09/13/2016 0730   GLUCOSE 86 10/02/2016 2304   GLUCOSE 89 09/25/2016 2000   GLUCOSE 92 09/13/2016 0730   BUN 10 10/02/2016 2304   BUN 9 09/25/2016 2000   BUN 17 09/13/2016 0730   BUN 11 08/23/2016 1503   BUN 6 05/09/2016 1048   BUN 7 03/20/2016 1557   CREATININE 0.74 10/02/2016 2304   CREATININE 0.75 09/25/2016 2000   CREATININE 0.53 09/13/2016 0730   CALCIUM 8.8 (L) 10/02/2016 2304   CALCIUM 9.2 09/25/2016 2000   CALCIUM 9.3 09/13/2016 0730   GFRNONAA >60 10/02/2016 2304   GFRNONAA >60 09/25/2016 2000   GFRNONAA >60 09/13/2016 0730   GFRAA >60 10/02/2016 2304   GFRAA >60 09/25/2016 2000   GFRAA >60 09/13/2016 0730   CMP  Component Value Date/Time   NA 137 10/02/2016 2304   NA 136 08/23/2016 1503   K 3.4 (L) 10/02/2016 2304   CL 105 10/02/2016 2304   CO2 26 10/02/2016 2304   GLUCOSE 86 10/02/2016 2304   BUN 10 10/02/2016 2304   BUN 11 08/23/2016 1503   CREATININE 0.74 10/02/2016 2304   CALCIUM 8.8 (L) 10/02/2016 2304   PROT 7.4 10/02/2016 2304   PROT 6.6 08/23/2016 1503   ALBUMIN 3.6 10/02/2016 2304   ALBUMIN 3.4 (L) 08/23/2016 1503   AST 13 (L) 10/02/2016 2304   ALT 11 (L) 10/02/2016 2304   ALKPHOS 66 10/02/2016 2304   BILITOT 0.3 10/02/2016 2304   BILITOT <0.2 08/23/2016 1503   GFRNONAA >60 10/02/2016 2304   GFRAA >60 10/02/2016 2304       Component Value Date/Time   WBC 8.5 10/02/2016 2304   WBC 8.4 09/25/2016 2000   WBC 9.7 09/13/2016 0730   HGB 10.7 (L) 10/02/2016 2304   HGB 11.8 (L) 09/25/2016 2000   HGB 11.2 (L) 09/13/2016 0730   HCT 32.8 (L) 10/02/2016 2304   HCT 35.9 (L) 09/25/2016 2000   HCT 34.7 (L) 09/13/2016 0730   HCT 32.5 (L) 08/23/2016 1503   HCT 31.8 (L) 07/18/2016 0909   HCT 38.6 03/20/2016 1557    MCV 82.6 10/02/2016 2304   MCV 83.7 09/25/2016 2000   MCV 85.5 09/13/2016 0730   MCV 85 08/23/2016 1503   MCV 87 07/18/2016 0909   MCV 86 03/20/2016 1557    Lipid Panel  No results found for: CHOL, TRIG, HDL, CHOLHDL, VLDL, LDLCALC, LDLDIRECT  ABG No results found for: PHART, PCO2ART, PO2ART, HCO3, TCO2, ACIDBASEDEF, O2SAT   No results found for: TSH BNP (last 3 results) No results for input(s): BNP in the last 8760 hours.  ProBNP (last 3 results) No results for input(s): PROBNP in the last 8760 hours.  Cardiac Panel (last 3 results) No results for input(s): CKTOTAL, CKMB, TROPONINI, RELINDX in the last 72 hours.  Iron/TIBC/Ferritin/ %Sat No results found for: IRON, TIBC, FERRITIN, IRONPCTSAT   EKG Orders placed or performed during the hospital encounter of 10/02/16  . EKG 12-Lead  . EKG 12-Lead  . EKG 12-Lead  . EKG 12-Lead  . EKG 12-Lead  . EKG 12-Lead     Prior Assessment and Plan Problem List as of 12/12/2016 Reviewed: 10/18/2016  5:17 PM by Tawnya Crook, CNM     Other   Retinal detachment of left eye with retinal break   Anxiety   Atypical chest pain   History of 2 cesarean sections   Hx of severe preeclampsia       Imaging: No results found.

## 2016-12-14 IMAGING — US US OB COMP +14 WK
1 series · 12 of 28 positions shown · non-contrast
Comparison: none

[Series 1: us ob follow up · 12 of 74 slices shown]
[im 3/74]
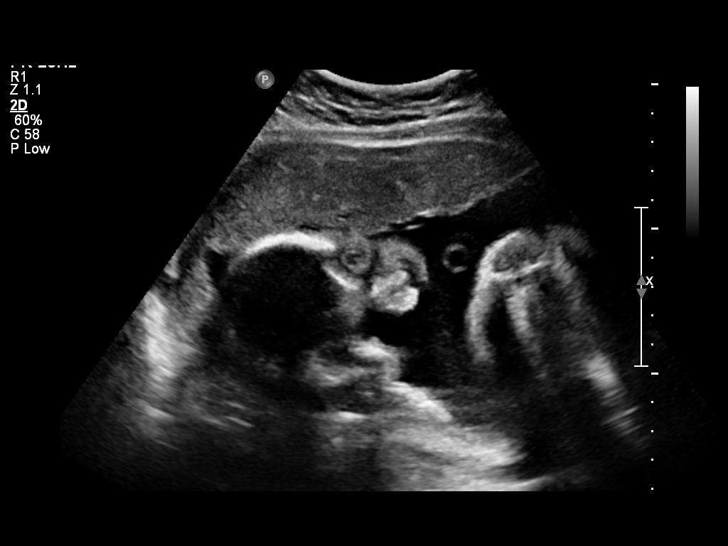
[im 9/74]
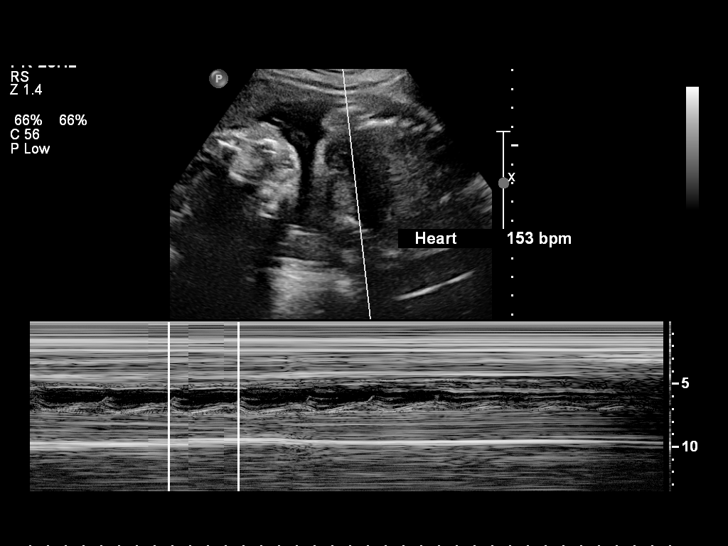
[im 14/74]
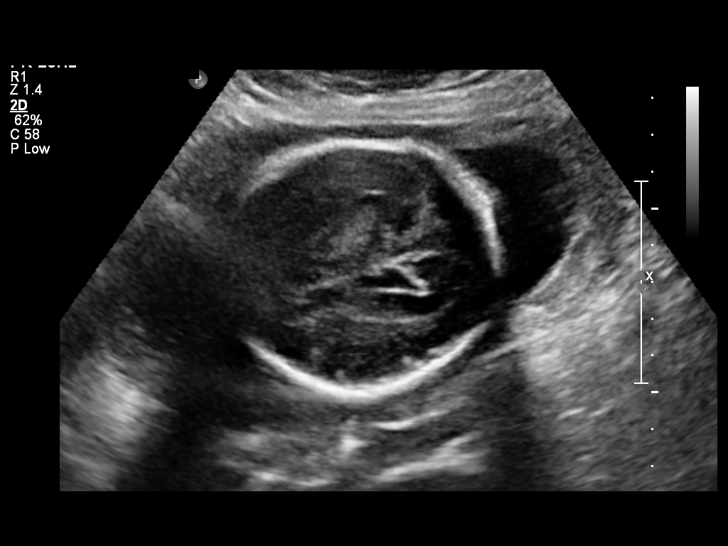
[im 22/74]
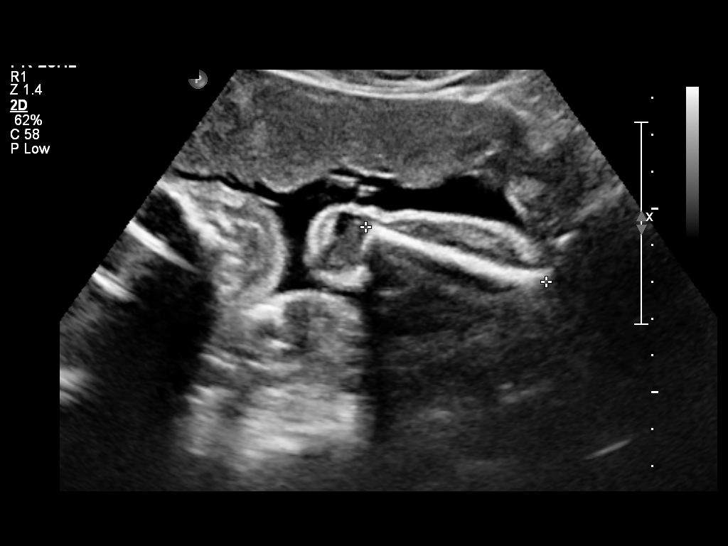
[im 28/74]
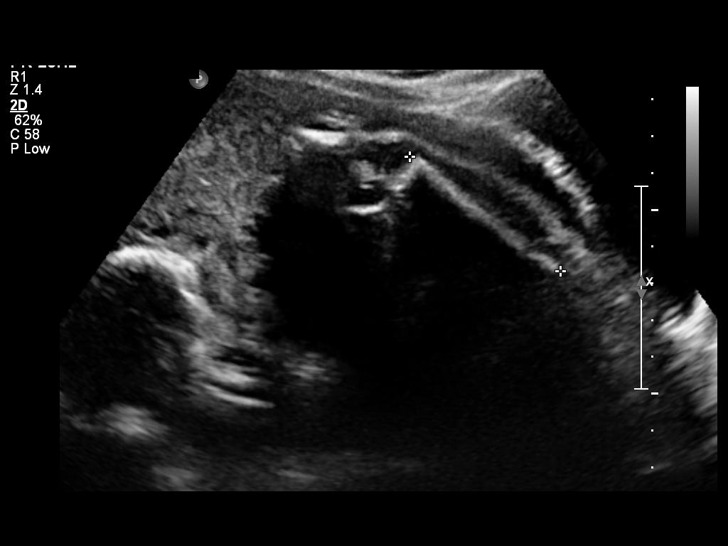
[im 33/74]
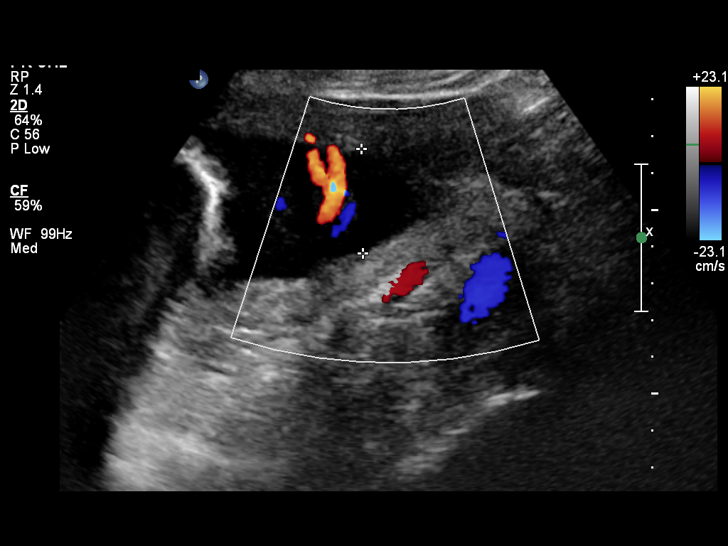
[im 41/74]
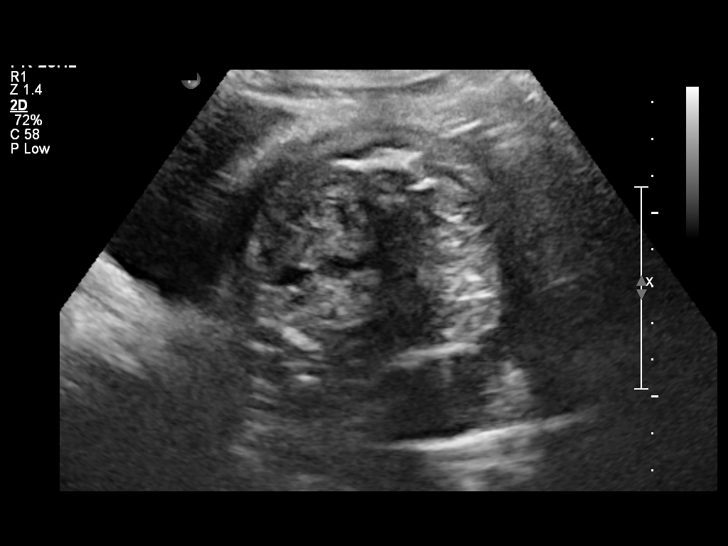
[im 46/74]
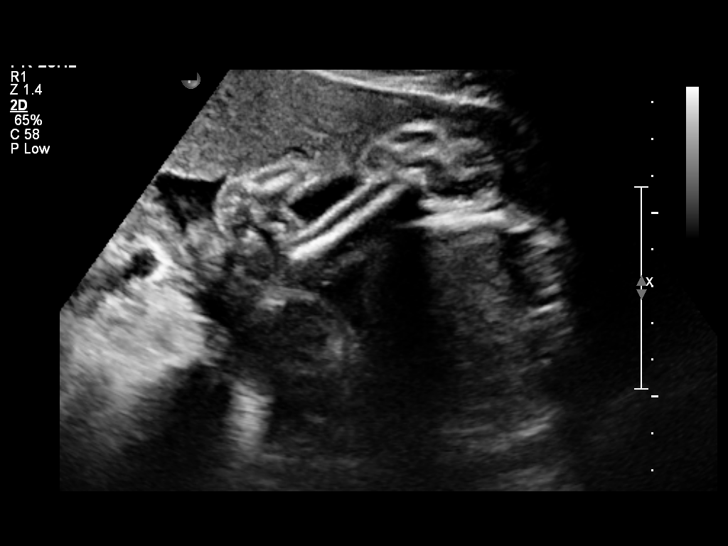
[im 52/74]
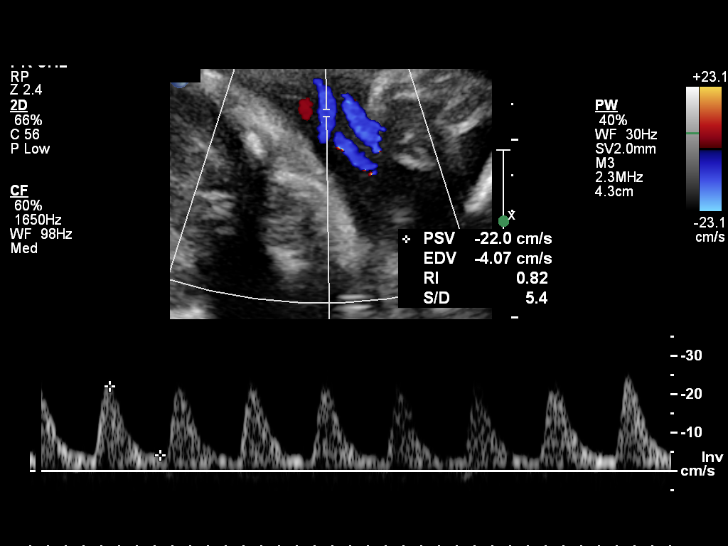
[im 60/74]
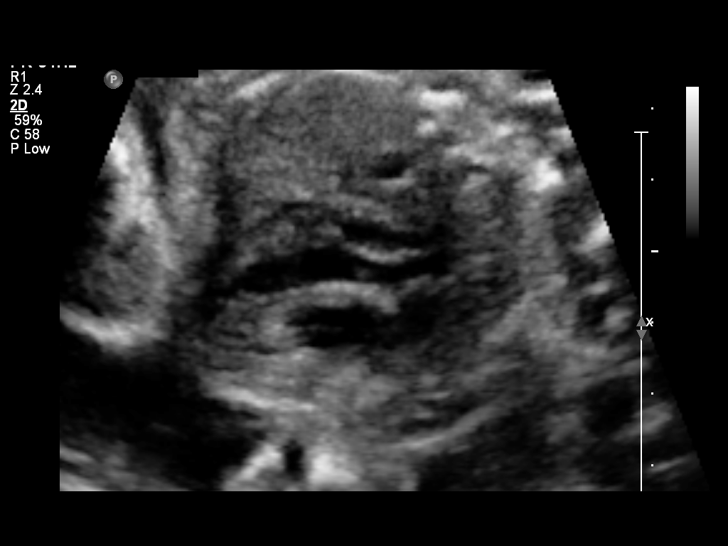
[im 65/74]
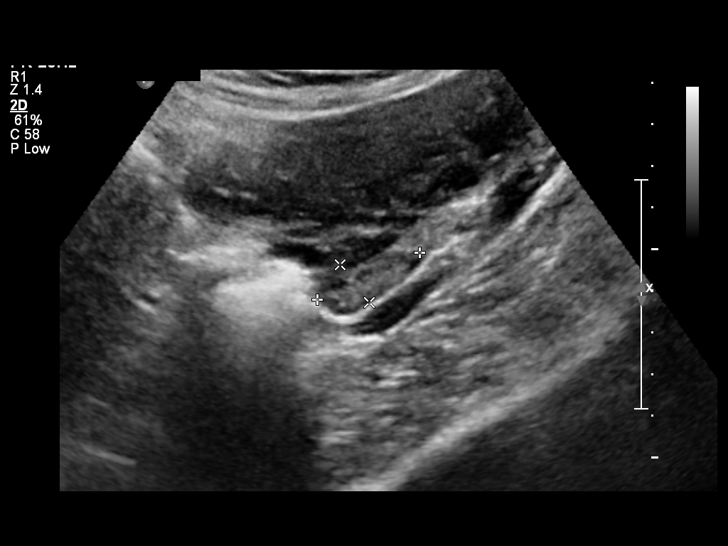
[im 71/74]
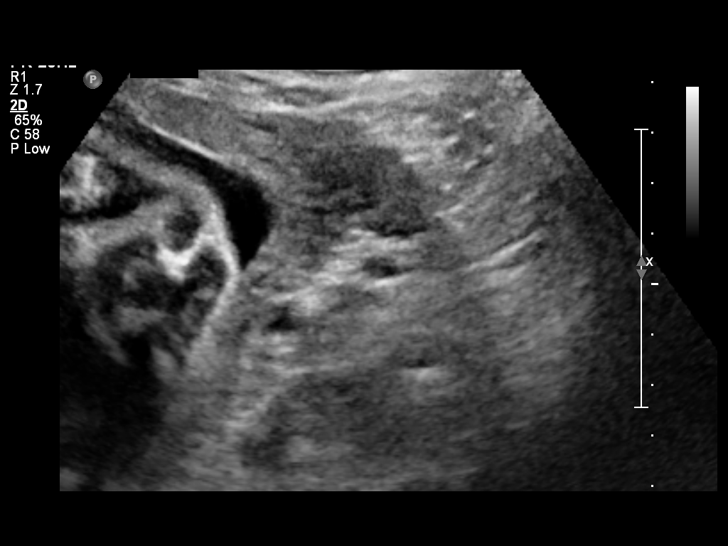

[12 of 28 positions shown; findings below may reference images not displayed]

OBSTETRICS REPORT
(Signed Final 04/08/2015 [DATE])

Name:       VISCOUNT LOZADA Side                        Visit  04/07/2015 [DATE]
Date:

Service(s) Provided

US OB COMP + 14 WK                                     76805.1
US UA CORD DOPPLER                                     76820.0
Indications

Pre-eclampsia
Size less than dates (Small for gestational age,
FGR)
29 weeks gestation of pregnancy
Breech presentation
Fetal Evaluation

Num Of             1
Fetuses:
Fetal Heart        153                          bpm
Rate:
Cardiac Activity:  Observed
Presentation:      Frank breech
Placenta:          Anterior RT, above
cervical os
P. Cord            Not well visualized
Insertion:

Amniotic Fluid
AFI FV:      Subjectively within normal limits
AFI Sum:     11.66    cm      26  %Tile     Larg Pckt:    3.76   cm
RUQ:   2.26    cm    RLQ:   2.81    cm   LUQ:    3.76    cm   LLQ:    2.83   cm
Biometry

BPD:       68   m    G. Age:   27w 3d                 CI:        67.94   70 - 86
m
FL/HC:      19.4   19.6 -
20.8
HC:     263.9   m    G. Age:   28w 5d        13  %    HC/AC:      1.22   0.99 -
m
AC:     216.9   m    G. Age:   26w 1d       < 3  %    FL/BPD      75.4   71 - 87
m                                     :
FL:      51.3   m    G. Age:   27w 3d         6  %    FL/AC:      23.7   20 - 24
m
HUM:       47   m    G. Age:   27w 5d        16  %
m
CER:     32.3   m    G. Age:   28w 2d        40  %
m

Est.        8996   gm    2 lb 3 oz      18   %
FW:
Gestational Age

Clinical EDD:  29w 0d                                         EDD:   06/23/15
U/S Today:     27w 3d                                         EDD:   07/04/15
Best:          29w 0d    Det. By:   Clinical EDD              EDD:   06/23/15
Anatomy

Cranium:          Appears normal         Aortic Arch:       Not well visualized
Fetal Cavum:      Appears normal         Ductal Arch:       Appears normal
Ventricles:       Appears normal         Diaphragm:         Appears normal
Choroid Plexus:   Appears normal         Stomach:           Appears normal,
left sided
Cerebellum:       Appears normal         Abdomen:           Appears normal
Posterior         Appears normal         Abdominal          Appears nml (cord
Fossa:                                   Wall:              insert, abd wall)
Nuchal Fold:      Not applicable (>20    Cord Vessels:      Appears normal (3
wks GA)                                   vessel cord)
Face:             Appears normal         Kidneys:           Appear normal
(orbits and profile)
Lips:             Appears normal         Bladder:           Appears normal
Heart:            Appears normal         Spine:             Not well visualized
(4CH, axis, and
situs)
RVOT:             Appears normal         Lower              Not well visualized
Extremities:
LVOT:             Appears normal         Upper              Not well visualized
Extremities:

Other:   Technically difficult due to fetal position.  Male gender.
Targeted Anatomy

Fetal Central Nervous System
Lat. Ventricles:
Doppler - Fetal Vessels

Umbilical Artery
S/D:   6          > 97.5   %tile
Umbilical Artery
Absent DFV:     No    Reverse         No
DFV:

Cervix Uterus Adnexa
Cervix:       Not visualized (advanced GA >32wks)

Left Ovary:    Within normal limits.
Right Ovary:   Within normal limits.
Impression

Single IUP at 29w 0d
Preeclampsia with severe features
The estimated fetal weight is at the 18th %tile.  The AC
measures < 3rd%tile (asymmetric growth)
UA Dopplers: Elevated S/D ratio but no AEDF or REDF
Normal amniotic fluid volume

---------------------------------------------------------------------- Recommendations

Recommend at least BID fetal tracings (or more frequently
based on the clinical scenario)
Kin Pong series
Weekly UA Doppler studies/ BPP
Ultrasound for growth in 3 weeks if undelivered

## 2017-01-31 ENCOUNTER — Ambulatory Visit (HOSPITAL_COMMUNITY)
Admission: RE | Admit: 2017-01-31 | Discharge: 2017-01-31 | Disposition: A | Discharge status: Home / Self Care | Payer: BLUE CROSS/BLUE SHIELD | Source: Ambulatory Visit | Attending: Internal Medicine | Admitting: Internal Medicine

## 2017-01-31 ENCOUNTER — Other Ambulatory Visit (HOSPITAL_COMMUNITY): Payer: Self-pay | Admitting: Internal Medicine

## 2017-01-31 DIAGNOSIS — R1031 Right lower quadrant pain: Principal | ICD-10-CM

## 2017-01-31 DIAGNOSIS — G8929 Other chronic pain: Secondary | ICD-10-CM | POA: Diagnosis present

## 2017-01-31 DIAGNOSIS — N83202 Unspecified ovarian cyst, left side: Secondary | ICD-10-CM | POA: Diagnosis not present

## 2017-02-13 ENCOUNTER — Ambulatory Visit: Payer: BLUE CROSS/BLUE SHIELD

## 2017-02-15 ENCOUNTER — Emergency Department (HOSPITAL_COMMUNITY): Payer: BLUE CROSS/BLUE SHIELD

## 2017-02-15 ENCOUNTER — Encounter (HOSPITAL_COMMUNITY): Payer: Self-pay | Admitting: Emergency Medicine

## 2017-02-15 ENCOUNTER — Emergency Department (HOSPITAL_COMMUNITY)
Admission: EM | Admit: 2017-02-15 | Discharge: 2017-02-15 | Disposition: A | Discharge status: Home / Self Care | Payer: BLUE CROSS/BLUE SHIELD | Attending: Emergency Medicine | Admitting: Emergency Medicine

## 2017-02-15 DIAGNOSIS — Z7951 Long term (current) use of inhaled steroids: Secondary | ICD-10-CM | POA: Insufficient documentation

## 2017-02-15 DIAGNOSIS — J45909 Unspecified asthma, uncomplicated: Secondary | ICD-10-CM | POA: Insufficient documentation

## 2017-02-15 DIAGNOSIS — R079 Chest pain, unspecified: Secondary | ICD-10-CM | POA: Insufficient documentation

## 2017-02-15 DIAGNOSIS — Z79899 Other long term (current) drug therapy: Secondary | ICD-10-CM | POA: Diagnosis not present

## 2017-02-15 LAB — CBC WITH DIFFERENTIAL/PLATELET
Basophils Absolute: 0 10*3/uL (ref 0.0–0.1)
Basophils Relative: 1 %
Eosinophils Absolute: 0.1 10*3/uL (ref 0.0–0.7)
Eosinophils Relative: 1 %
HCT: 40 % (ref 36.0–46.0)
Hemoglobin: 12.9 g/dL (ref 12.0–15.0)
Lymphocytes Relative: 32 %
Lymphs Abs: 2.6 10*3/uL (ref 0.7–4.0)
MCH: 25.4 pg — ABNORMAL LOW (ref 26.0–34.0)
MCHC: 32.3 g/dL (ref 30.0–36.0)
MCV: 78.9 fL (ref 78.0–100.0)
Monocytes Absolute: 0.7 10*3/uL (ref 0.1–1.0)
Monocytes Relative: 8 %
Neutro Abs: 4.8 10*3/uL (ref 1.7–7.7)
Neutrophils Relative %: 58 %
Platelets: 300 10*3/uL (ref 150–400)
RBC: 5.07 MIL/uL (ref 3.87–5.11)
RDW: 15.8 % — ABNORMAL HIGH (ref 11.5–15.5)
WBC: 8.2 10*3/uL (ref 4.0–10.5)

## 2017-02-15 LAB — D-DIMER, QUANTITATIVE: D-Dimer, Quant: <0.27 ug/mL-FEU (ref 0.00–0.50)

## 2017-02-15 LAB — BASIC METABOLIC PANEL
Anion gap: 7 (ref 5–15)
BUN: 12 mg/dL (ref 6–20)
CO2: 22 mmol/L (ref 22–32)
Calcium: 9.7 mg/dL (ref 8.9–10.3)
Chloride: 109 mmol/L (ref 101–111)
Creatinine, Ser: 0.88 mg/dL (ref 0.44–1.00)
GFR calc Af Amer: >60 mL/min (ref >60)
GFR calc non Af Amer: >60 mL/min (ref >60)
Glucose, Bld: 101 mg/dL — ABNORMAL HIGH (ref 65–99)
Potassium: 4.3 mmol/L (ref 3.5–5.1)
Sodium: 138 mmol/L (ref 135–145)

## 2017-02-15 LAB — TROPONIN I: Troponin I: <0.03 ng/mL (ref <0.03)

## 2017-02-15 NOTE — ED Notes (Signed)
Ambulated pt around nurses station. Pt maintained a steady gait and O2 level of 100% during ambulation

## 2017-02-15 NOTE — Discharge Instructions (Addendum)
Tylenol as needed for pain.  Get plenty of rest, and drink a lot of fluids.  Return here, if needed, for problems.

## 2017-02-15 NOTE — ED Provider Notes (Signed)
[facility] DEPT Provider Note   CSN: [id number] Arrival date & time: 02/15/17  1725     History   Chief Complaint Chief Complaint  Patient presents with  . Chest Pain  . Dizziness    HPI Kathy Zimmerman is a 26 y.o. female.  Presents for evaluation of chest pain.  She complains of intermittent chest pain, aching in nature, for 4 days.  She also reports dizziness, which is worse when supine, and improves when standing.  She reports that she checked her heart rate and it was 150 and her oxygen was 90%.  She is concerned that both of these are abnormal, and is worried that she might have a pneumonia.  She has a cough but it is nonproductive.  She denies fever, chills, nausea, vomiting, diarrhea, constipation, dysuria or urinary frequency.  She is taking her usual medications.  There are no other known modifying factors.  HPI  Past Medical History:  Diagnosis Date  . Anxiety   . Asthma   . Headache   . IBS (irritable bowel syndrome)   . Panic attack   . Preeclampsia     Patient Active Problem List   Diagnosis Date Noted  . History of 2 cesarean sections 03/20/2016  . Hx of severe preeclampsia 03/20/2016  . Atypical chest pain 05/31/2015  . Anxiety 04/11/2015  . Retinal detachment of left eye with retinal break 08/08/2011    Class: Acute    Past Surgical History:  Procedure Laterality Date  . CESAREAN SECTION N/A 04/10/2015   Procedure: CESAREAN SECTION;  Surgeon: Crawford Givens, MD;  Location: [facility];  Service: Obstetrics;  Laterality: N/A;  . EYE SURGERY     laser  . SCLERAL BUCKLE  08/09/2011   Procedure: SCLERAL BUCKLE;  Surgeon: Clent Demark Rankin;  Location: [facility];  Service: Ophthalmology;  Laterality: Left;  with cryo  . TENDON REPAIR      OB History    Gravida Para Term Preterm AB Living   2 2   2   2    SAB TAB Ectopic Multiple Live Births         0 2       Home Medications    Prior to Admission medications   Medication Sig Start Date End Date Taking?  Authorizing Provider  atenolol (TENORMIN) 25 MG tablet Take 12.5 mg by mouth 2 (two) times daily.   Yes [provider]  buPROPion (WELLBUTRIN SR) 100 MG 12 hr tablet Take 100 mg by mouth daily.   Yes [provider]  cetirizine (ZYRTEC) 10 MG tablet Take 10 mg by mouth as needed.  11/10/16 11/10/17 Yes [provider]  medroxyPROGESTERone (DEPO-PROVERA) 150 MG/ML injection Inject 150 mg into the muscle every 3 (three) months.   Yes [provider]  PROAIR HFA 108 (90 Base) MCG/ACT inhaler INHALE 1 TO 2 PUFFS PO Q 4 TO 6 H PRN FOR SOB AND WHEEZING 11/10/16  Yes [provider]  topiramate (TOPAMAX) 25 MG tablet Take 25 mg by mouth 2 (two) times daily.   Yes [provider]  sertraline (ZOLOFT) 100 MG tablet Take 150 mg by mouth daily.     [provider]    Family History Family History  Problem Relation Age of Onset  . Depression Father   . Depression Sister   . Mental illness Sister   . Depression Maternal Grandmother   . Mental illness Maternal Grandmother   . Thyroid disease Maternal Grandmother   .  Heart disease Maternal Grandfather   . Hypertension Maternal Grandfather   . Hyperlipidemia Maternal Grandfather   . CAD Maternal Grandfather   . Thyroid disease Other   . CAD Other     Social History Social History  Substance Use Topics  . Smoking status: Never Smoker  . Smokeless tobacco: Never Used  . Alcohol use No     Allergies   Sulfa antibiotics   Review of Systems Review of Systems  All other systems reviewed and are negative.    Physical Exam Updated Vital Signs BP 118/88   Pulse 90   Temp 98.9 F (37.2 C) (Oral)   Resp 17   Ht 5\' 6"  (1.676 m)   Wt 74.8 kg (165 lb)   LMP  (LMP Unknown)   SpO2 98%   BMI 26.63 kg/m   Physical Exam  Constitutional: She is oriented to person, place, and time. She appears well-developed and well-nourished. She appears distressed (She is uncomfortable).  HENT:    Head: Normocephalic and atraumatic.  Eyes: Conjunctivae and EOM are normal. Pupils are equal, round, and reactive to light.  Neck: Normal range of motion and phonation normal. Neck supple.  Cardiovascular: Normal rate and regular rhythm.   Pulmonary/Chest: Effort normal and breath sounds normal. No respiratory distress. She has no wheezes. She exhibits no tenderness.  Abdominal: Soft. She exhibits no distension. There is no tenderness. There is no guarding.  Musculoskeletal: Normal range of motion.  Neurological: She is alert and oriented to person, place, and time. She exhibits normal muscle tone.  Skin: Skin is warm and dry.  Psychiatric: Her behavior is normal. Judgment and thought content normal.  She is anxious  Nursing note and vitals reviewed.    ED Treatments / Results  Labs (all labs ordered are listed, but only abnormal results are displayed) Labs Reviewed  CBC WITH DIFFERENTIAL/PLATELET - Abnormal; Notable for the following:       Result Value   MCH 25.4 (*)    RDW 15.8 (*)    All other components within normal limits  BASIC METABOLIC PANEL - Abnormal; Notable for the following:    Glucose, Bld 101 (*)    All other components within normal limits  TROPONIN I  D-DIMER, QUANTITATIVE (NOT AT [facility])    EKG  EKG Interpretation  Date/Time:  Thursday February 15 2017 17:31:35 EDT Ventricular Rate:  90 PR Interval:    QRS Duration: 90 QT Interval:  343 QTC Calculation: 420 R Axis:   65 Text Interpretation:  Sinus rhythm Baseline wander in lead(s) V3 V4 since last tracing no significant change Confirmed by Daleen Bo [phone]) on 02/15/2017 5:39:43 PM       Radiology Dg Chest 2 View  Result Date: 02/15/2017 CLINICAL DATA:  Central chest pain. Shortness of breath and dizziness. EXAM: CHEST  2 VIEW COMPARISON:  October 09, 2016 FINDINGS: The heart size and mediastinal contours are within normal limits. Both lungs are clear. The visualized skeletal structures are  unremarkable. IMPRESSION: No active cardiopulmonary disease. Electronically Signed   By: Dorise Bullion III M.D   On: 02/15/2017 17:57    Procedures Procedures (including critical care time)  Medications Ordered in ED Medications - No data to display   Initial Impression / Assessment and Plan / ED Course  I have reviewed the triage vital signs and the nursing notes.  Pertinent labs & imaging results that were available during my care of the patient were reviewed by me and considered in my  medical decision making (see chart for details).      Patient Vitals for the past 24 hrs:  BP Temp Temp src Pulse Resp SpO2 Height Weight  02/15/17 2030 - - - 90 - 98 % - -  02/15/17 2002 118/88 - - 98 17 100 % - -  02/15/17 2000 118/88 - - 96 - 99 % - -  02/15/17 1930 117/69 - - 84 - 100 % - -  02/15/17 1915 - - - 96 - 99 % - -  02/15/17 1900 118/84 - - 97 - 100 % - -  02/15/17 1845 - - - 94 - 100 % - -  02/15/17 1830 114/78 - - 96 - 100 % - -  02/15/17 1815 - - - 94 - 100 % - -  02/15/17 1800 114/80 - - 81 - 100 % - -  02/15/17 1745 - - - 96 (!) 21 100 % - -  02/15/17 1731 135/90 98.9 F (37.2 C) Oral 93 17 100 % - -  02/15/17 1728 - - - - - - 5\' 6"  (1.676 m) 74.8 kg (165 lb)    At Discharge- reevaluation with update and discussion. After initial assessment and treatment, an updated evaluation reveals she is comfortable has no further complaints.  She has been able ambulate easily with normal oxygen saturation.  Findings discussed with patient and family member, all questions were answered. Kathy Zimmerman    Final Clinical Impressions(s) / ED Diagnoses   Final diagnoses:  Nonspecific chest pain  Chest pain, unspecified type   Nonspecific chest pain, with anxiety.  Doubt ACS, PE and pneumonia.  Nursing Notes Reviewed/ Care Coordinated Applicable Imaging Reviewed Interpretation of Laboratory Data incorporated into ED treatment  The patient appears reasonably screened and/or  stabilized for discharge and I doubt any other medical condition or other [facility] requiring further screening, evaluation, or treatment in the ED at this time prior to discharge.  Plan: Home Medications-continue usual medications; Home Treatments-rest, fluids; return here if the recommended treatment, does not improve the symptoms; Recommended follow up-PCP, as needed   New Prescriptions Discharge Medication List as of 02/15/2017  8:49 PM       Daleen Bo, MD 02/15/17 2158

## 2017-02-15 NOTE — ED Triage Notes (Signed)
Pt hx of "heart problem" started atenolol in January. Pt c/o chest achyness to mid chest and dizziness x 2 days.seen pcp for this 2 days ago and refilled topamax where she was out 3 days. A/o. Non diaphoretic. nad at this time.

## 2017-05-29 DIAGNOSIS — R000 Tachycardia, unspecified: Secondary | ICD-10-CM

## 2017-05-29 DIAGNOSIS — I951 Orthostatic hypotension: Secondary | ICD-10-CM

## 2017-05-29 NOTE — Progress Notes (Signed)
Cardiology Office Note    Date:  05/30/2017   ID:  Kathy Zimmerman, DOB [birth year], MRN [id number]  PCP:  Celene Squibb, MD  Cardiologist: Dr. Johnsie Cancel  No chief complaint on file.   History of Present Illness:  Kathy Zimmerman is a 26 y.o. female with history of possible POTS syndrome started on low-dose atenolol, history of anxiety, panic attacks, IBS and asthma. ETT was normal2/9/18. She has been on both bystolic and atenolol and seems to tolerate atenolol better. Last saw Jory Sims 12/12/16 and was doing better on atenolol. She advised to avoid hypotension. 2-D echo 10/2016 LVEF 55-60%.  Patient comes in today because her BP was running high 145/88. It made her anxious because she had preeclampsia in the past. She eats a lot of fast food and is drinking a lot of sweet tea. Was also put on lithium again.Still has some breakthrough tachycardia. She had decreased her atenolol to half a tablet daily rather than twice a day. Last night she took it twice a day. It seems to have helped.   Past Medical History:  Diagnosis Date  . Anxiety   . Asthma   . Headache   . IBS (irritable bowel syndrome)   . Panic attack   . Preeclampsia     Past Surgical History:  Procedure Laterality Date  . CESAREAN SECTION N/A 04/10/2015   Procedure: CESAREAN SECTION;  Surgeon: Crawford Givens, MD;  Location: [facility];  Service: Obstetrics;  Laterality: N/A;  . EYE SURGERY     laser  . SCLERAL BUCKLE  08/09/2011   Procedure: SCLERAL BUCKLE;  Surgeon: Clent Demark Rankin;  Location: [facility];  Service: Ophthalmology;  Laterality: Left;  with cryo  . TENDON REPAIR      Current Medications: Current Meds  Medication Sig  . atenolol (TENORMIN) 25 MG tablet Take 12.5 mg by mouth 2 (two) times daily.  Marland Kitchen lithium carbonate 300 MG capsule Take 300 mg by mouth 3 (three) times daily with meals.  . medroxyPROGESTERone (DEPO-PROVERA) 150 MG/ML injection Inject 150 mg into the muscle every 3 (three) months.  Marland Kitchen PROAIR HFA 108  (90 Base) MCG/ACT inhaler INHALE 1 TO 2 PUFFS PO Q 4 TO 6 H PRN FOR SOB AND WHEEZING     Allergies:   Sulfa antibiotics   Social History   Social History  . Marital status: Married    Spouse name: N/A  . Number of children: N/A  . Years of education: N/A   Social History Main Topics  . Smoking status: Never Smoker  . Smokeless tobacco: Never Used  . Alcohol use No  . Drug use: No  . Sexual activity: Not Currently    Birth control/ protection: Injection   Other Topics Concern  . None   Social History Narrative  . None     Family History:  The patient's family history includes CAD in her maternal grandfather and other; Depression in her father, maternal grandmother, and sister; Heart disease in her maternal grandfather; Hyperlipidemia in her maternal grandfather; Hypertension in her maternal grandfather; Mental illness in her maternal grandmother and sister; Thyroid disease in her maternal grandmother and other.   ROS:   Please see the history of present illness.    Review of Systems  Constitution: Negative.  HENT: Negative.   Eyes: Negative.   Cardiovascular: Positive for palpitations.  Respiratory: Negative.   Hematologic/Lymphatic: Negative.   Musculoskeletal: Negative.  Negative for joint pain.  Gastrointestinal: Negative.   Genitourinary:  Negative.   Neurological: Negative.   Psychiatric/Behavioral: The patient is nervous/anxious.    All other systems reviewed and are negative.   PHYSICAL EXAM:   VS:  BP 130/82 (BP Location: Left Arm)   Pulse 98   Ht 5\' 6"  (1.676 m)   Wt 165 lb (74.8 kg)   SpO2 98%   BMI 26.63 kg/m   Physical Exam  GEN: Well nourished, well developed, in no acute distress  Neck: no JVD, carotid bruits, or masses Cardiac:RRR; no murmurs, rubs, or gallops  Respiratory:  clear to auscultation bilaterally, normal work of breathing GI: soft, nontender, nondistended, + BS Ext: without cyanosis, clubbing, or edema, Good distal pulses  bilaterally Neuro:  Alert and Oriented x 3 Psych: Anxious  Wt Readings from Last 3 Encounters:  05/30/17 165 lb (74.8 kg)  02/15/17 165 lb (74.8 kg)  12/12/16 166 lb (75.3 kg)      Studies/Labs Reviewed:   EKG:  EKG is not ordered today.    Recent Labs: 10/02/2016: ALT 11 02/15/2017: BUN 12; Creatinine, Ser 0.88; Hemoglobin 12.9; Platelets 300; Potassium 4.3; Sodium 138   Lipid Panel No results found for: CHOL, TRIG, HDL, CHOLHDL, VLDL, LDLCALC, LDLDIRECT  Additional studies/ records that were reviewed today include:  Echocardiogram 10/20/2016 Left ventricle: The cavity size was normal. Wall thickness was   increased in a pattern of mild LVH. Systolic function was normal.   The estimated ejection fraction was in the range of 55% to 60%.   Wall motion was normal; there were no regional wall motion   abnormalities. Left ventricular diastolic function parameters   were normal. - Right atrium: Central venous pressure (est): 3 mm Hg. - Atrial septum: No defect or patent foramen ovale was identified. - Tricuspid valve: There was trivial regurgitation. - Pulmonary arteries: Systolic pressure could not be accurately   estimated. - Pericardium, extracardiac: There was no pericardial effusion.     GXT 2/9/18Study Highlights   No diagnostic ST segment changes in the setting of lead motion artifact. Duke treadmill score is 3, technically intermediate risk due to nonlimiting chest pain, but no diagnostic ST segment changes or arrhythmias.  Blood pressure demonstrated a normal response to exercise.         ASSESSMENT:    1. Essential hypertension   2. POTS (postural orthostatic tachycardia syndrome)   3. Anxiety      PLAN:  In order of problems listed above:   Essential hypertension patient's blood pressure has been up recently. She had decreased her atenolol to 12.5 mg once daily rather than twice a day. She is also eating a lot of fast foods and sweet tea. Have discussed 2  g sodium diet including cutting back on sweet tea. I'm reluctant to increase atenolol with history of pots. Advised her to take it twice a day and see how her blood pressure does with diet change. Follow-up with Dr. Johnsie Cancel scheduled.  POTS improved with atenolol. She has tachycardia if she forgets her atenolol.  Anxiety recently started on lithium again. Has had hypotension on this in the past.   Medication Adjustments/Labs and Tests Ordered: Current medicines are reviewed at length with the patient today.  Concerns regarding medicines are outlined above.  Medication changes, Labs and Tests ordered today are listed in the Patient Instructions below. Patient Instructions  Medication Instructions:  Your physician recommends that you continue on your current medications as directed. Please refer to the Current Medication list given to you today.   Labwork:  NONE   Testing/Procedures: NONE   Follow-Up: Your physician recommends that you schedule a follow-up appointment with Dr. Johnsie Cancel    Any Other Special Instructions Will Be Listed Below (If Applicable). You have been given a 2 Gm low sodium diet     If you need a refill on your cardiac medications before your next appointment, please call your pharmacy. Thank you for choosing [facility]!      Sumner Boast, PA-C  05/30/2017 2:04 PM    [facility] [address], [place], [place]  [address] Phone: [phone]; Fax: [phone]

## 2017-05-30 ENCOUNTER — Encounter: Payer: Self-pay | Admitting: Physician Assistant

## 2017-05-30 ENCOUNTER — Ambulatory Visit (INDEPENDENT_AMBULATORY_CARE_PROVIDER_SITE_OTHER): Payer: BLUE CROSS/BLUE SHIELD | Admitting: Physician Assistant

## 2017-05-30 VITALS — BP 130/82 | HR 98 | Ht 66.0 in | Wt 165.0 lb

## 2017-05-30 DIAGNOSIS — I951 Orthostatic hypotension: Secondary | ICD-10-CM | POA: Diagnosis not present

## 2017-05-30 DIAGNOSIS — I498 Other specified cardiac arrhythmias: Secondary | ICD-10-CM

## 2017-05-30 DIAGNOSIS — R000 Tachycardia, unspecified: Secondary | ICD-10-CM

## 2017-05-30 DIAGNOSIS — G90A Postural orthostatic tachycardia syndrome (POTS): Secondary | ICD-10-CM

## 2017-05-30 DIAGNOSIS — F419 Anxiety disorder, unspecified: Secondary | ICD-10-CM

## 2017-05-30 DIAGNOSIS — I10 Essential (primary) hypertension: Secondary | ICD-10-CM

## 2017-05-30 NOTE — Patient Instructions (Signed)
Medication Instructions:  Your physician recommends that you continue on your current medications as directed. Please refer to the Current Medication list given to you today.   Labwork: NONE   Testing/Procedures: NONE   Follow-Up: Your physician recommends that you schedule a follow-up appointment with Dr. Johnsie Cancel    Any Other Special Instructions Will Be Listed Below (If Applicable). You have been given a 2 Gm low sodium diet     If you need a refill on your cardiac medications before your next appointment, please call your pharmacy. Thank you for choosing [facility]!

## 2017-06-04 ENCOUNTER — Ambulatory Visit: Payer: BLUE CROSS/BLUE SHIELD | Admitting: Adult Health

## 2017-06-18 ENCOUNTER — Ambulatory Visit: Payer: BLUE CROSS/BLUE SHIELD | Admitting: Cardiovascular Disease

## 2017-06-23 ENCOUNTER — Emergency Department (HOSPITAL_COMMUNITY)
Admission: EM | Admit: 2017-06-23 | Discharge: 2017-06-24 | Disposition: A | Discharge status: Home / Self Care | Payer: BLUE CROSS/BLUE SHIELD | Attending: Emergency Medicine | Admitting: Emergency Medicine

## 2017-06-23 ENCOUNTER — Encounter (HOSPITAL_COMMUNITY): Payer: Self-pay | Admitting: Emergency Medicine

## 2017-06-23 DIAGNOSIS — Y929 Unspecified place or not applicable: Secondary | ICD-10-CM | POA: Insufficient documentation

## 2017-06-23 DIAGNOSIS — T7840XA Allergy, unspecified, initial encounter: Secondary | ICD-10-CM | POA: Diagnosis not present

## 2017-06-23 DIAGNOSIS — Y9389 Activity, other specified: Secondary | ICD-10-CM | POA: Insufficient documentation

## 2017-06-23 DIAGNOSIS — Y999 Unspecified external cause status: Secondary | ICD-10-CM | POA: Diagnosis not present

## 2017-06-23 DIAGNOSIS — I10 Essential (primary) hypertension: Secondary | ICD-10-CM | POA: Insufficient documentation

## 2017-06-23 DIAGNOSIS — T781XXA Other adverse food reactions, not elsewhere classified, initial encounter: Secondary | ICD-10-CM | POA: Diagnosis not present

## 2017-06-23 DIAGNOSIS — R600 Localized edema: Secondary | ICD-10-CM | POA: Insufficient documentation

## 2017-06-23 DIAGNOSIS — J45909 Unspecified asthma, uncomplicated: Secondary | ICD-10-CM | POA: Diagnosis not present

## 2017-06-23 MED ORDER — ACETAMINOPHEN 500 MG PO TABS
1000.0000 mg | ORAL_TABLET | Freq: Once | ORAL | Status: AC
  Administered 2017-06-23: 1000 mg via ORAL
  Filled 2017-06-23: qty 2

## 2017-06-23 MED ORDER — EPINEPHRINE 0.3 MG/0.3ML IJ SOAJ
0.3000 mg | Freq: Once | INTRAMUSCULAR | Status: AC
  Administered 2017-06-23: 0.3 mg via INTRAMUSCULAR
  Filled 2017-06-23: qty 0.3

## 2017-06-23 MED ORDER — EPINEPHRINE 0.3 MG/0.3ML IJ SOAJ: 0.3000 mg | Freq: Once | INTRAMUSCULAR | 2 refills | Status: DC | PRN

## 2017-06-23 MED ORDER — PREDNISONE 20 MG PO TABS: 40.0000 mg | ORAL_TABLET | Freq: Every day | ORAL | 0 refills | Status: DC

## 2017-06-23 MED ORDER — DIPHENHYDRAMINE HCL 25 MG PO TABS: 25.0000 mg | ORAL_TABLET | Freq: Four times a day (QID) | ORAL | 0 refills | Status: DC | PRN

## 2017-06-23 MED ORDER — FAMOTIDINE IN NACL 20-0.9 MG/50ML-% IV SOLN
20.0000 mg | Freq: Once | INTRAVENOUS | Status: AC
  Administered 2017-06-23: 20 mg via INTRAVENOUS
  Filled 2017-06-23: qty 50

## 2017-06-23 MED ORDER — METHYLPREDNISOLONE SODIUM SUCC 125 MG IJ SOLR
125.0000 mg | Freq: Once | INTRAMUSCULAR | Status: AC
  Administered 2017-06-23: 125 mg via INTRAVENOUS
  Filled 2017-06-23: qty 2

## 2017-06-23 MED ORDER — FAMOTIDINE 20 MG PO TABS: 20.0000 mg | ORAL_TABLET | Freq: Two times a day (BID) | ORAL | 0 refills | Status: DC

## 2017-06-23 MED ORDER — DIPHENHYDRAMINE HCL 50 MG/ML IJ SOLN
25.0000 mg | Freq: Once | INTRAMUSCULAR | Status: AC
  Administered 2017-06-23: 25 mg via INTRAVENOUS
  Filled 2017-06-23: qty 1

## 2017-06-23 NOTE — Discharge Instructions (Signed)
1. Medications: Prednisone, Benadryl, Pepcid, epi pen, usual home medications 2. Treatment: rest, drink plenty of fluids, take medications as prescribed 3. Follow Up: Please followup with your primary doctor in 3 days for discussion of your diagnoses and further evaluation after today's visit; if you do not have a primary care doctor use the resource guide provided to find one; followup with dermatology as needed; Return to the ER for difficulty breathing, return of allergic reaction or other concerning symptoms

## 2017-06-23 NOTE — ED Provider Notes (Signed)
[facility] DEPT Provider Note   CSN: [id number] Arrival date & time: 06/23/17  2004     History   Chief Complaint Chief Complaint  Patient presents with  . Allergic Reaction    HPI Kathy Zimmerman is a 26 y.o. female with a hx of anxiety, asthma, POTS presents to the Emergency Department complaining of gradual, persistent, progressively worsening swelling of the posterior oropharynx and paresthesias of the lips onset 6:45pm tonight after eating "all you can eat" shrimp.  Pt reports she does not normally eat shrimp and symptoms began within 5min of finishing her dinner.  She reports feeling very anxious. She denies rash, itching, wheezing, nausea, vomiting.  No hx of allergic reactions or anaphylaxis.  Pt took 50mg  Benadryl PO without improvement.  Nothing seems to make the symptoms better or worse.  She does reports a tight feeling in her chest.  No family hx of angioedema.  No known offending medications.    The history is provided by the patient and medical records. No language interpreter was used.    Past Medical History:  Diagnosis Date  . Anxiety   . Asthma   . Headache   . IBS (irritable bowel syndrome)   . Panic attack   . Preeclampsia     Patient Active Problem List   Diagnosis Date Noted  . Hypertension 05/30/2017  . POTS (postural orthostatic tachycardia syndrome) 05/29/2017  . History of 2 cesarean sections 03/20/2016  . Hx of severe preeclampsia 03/20/2016  . Atypical chest pain 05/31/2015  . Anxiety 04/11/2015  . Retinal detachment of left eye with retinal break 08/08/2011    Class: Acute    Past Surgical History:  Procedure Laterality Date  . CESAREAN SECTION N/A 04/10/2015   Procedure: CESAREAN SECTION;  Surgeon: Crawford Givens, MD;  Location: [facility];  Service: Obstetrics;  Laterality: N/A;  . EYE SURGERY     laser  . SCLERAL BUCKLE  08/09/2011   Procedure: SCLERAL BUCKLE;  Surgeon: Clent Demark Rankin;  Location: [facility];  Service: Ophthalmology;   Laterality: Left;  with cryo  . TENDON REPAIR      OB History    Gravida Para Term Preterm AB Living   2 2   2   2    SAB TAB Ectopic Multiple Live Births         0 2       Home Medications    Prior to Admission medications   Medication Sig Start Date End Date Taking? Authorizing Provider  atenolol (TENORMIN) 25 MG tablet Take 12.5 mg by mouth 2 (two) times daily.   Yes [provider]  gabapentin (NEURONTIN) 100 MG capsule Take 100 mg by mouth daily as needed (for anxiety).  05/25/17 06/24/17 Yes [provider]  lithium carbonate 300 MG capsule Take 300 mg by mouth 3 (three) times daily with meals.   Yes [provider]  medroxyPROGESTERone (DEPO-PROVERA) 150 MG/ML injection Inject 150 mg into the muscle See admin instructions. IM every 3 months. Last injection dose May 14, 2017.   Yes [provider]  PROAIR HFA 108 (90 Base) MCG/ACT inhaler INHALE 1 TO 2 PUFFS PO Q 4 TO 6 H PRN FOR SOB AND WHEEZING 11/10/16  Yes [provider]  diphenhydrAMINE (BENADRYL) 25 MG tablet Take 1 tablet (25 mg total) by mouth every 6 (six) hours as needed for itching (Rash). 06/23/17   Loyda Costin, Jarrett Soho, PA-C  EPINEPHrine (EPIPEN 2-PAK) 0.3 mg/0.3 mL IJ SOAJ injection Inject 0.3  mLs (0.3 mg total) into the muscle once as needed (for severe allergic reaction). CAll 911 immediately if you have to use this medicine 06/23/17   Raeshawn Tafolla, Jarrett Soho, PA-C  famotidine (PEPCID) 20 MG tablet Take 1 tablet (20 mg total) by mouth 2 (two) times daily. 06/23/17   Cassandria Drew, Jarrett Soho, PA-C  predniSONE (DELTASONE) 20 MG tablet Take 2 tablets (40 mg total) by mouth daily. 06/23/17   Berlyn Saylor, Jarrett Soho, PA-C    Family History Family History  Problem Relation Age of Onset  . Depression Father   . Depression Sister   . Mental illness Sister   . Depression Maternal Grandmother   . Mental illness Maternal Grandmother   . Thyroid disease Maternal Grandmother   . Heart  disease Maternal Grandfather   . Hypertension Maternal Grandfather   . Hyperlipidemia Maternal Grandfather   . CAD Maternal Grandfather   . Thyroid disease Other   . CAD Other     Social History Social History  Substance Use Topics  . Smoking status: Never Smoker  . Smokeless tobacco: Never Used  . Alcohol use No     Allergies   Shrimp [shellfish allergy] and Sulfa antibiotics   Review of Systems Review of Systems  Constitutional: Negative for appetite change, diaphoresis, fatigue, fever and unexpected weight change.  HENT: Positive for facial swelling and trouble swallowing. Negative for mouth sores.   Eyes: Negative for visual disturbance.  Respiratory: Positive for chest tightness. Negative for cough, shortness of breath and wheezing.   Cardiovascular: Negative for chest pain.  Gastrointestinal: Negative for abdominal pain, constipation, diarrhea, nausea and vomiting.  Endocrine: Negative for polydipsia, polyphagia and polyuria.  Genitourinary: Negative for dysuria, frequency, hematuria and urgency.  Musculoskeletal: Negative for back pain and neck stiffness.  Skin: Negative for rash.  Allergic/Immunologic: Negative for immunocompromised state.  Neurological: Negative for syncope, light-headedness and headaches.  Hematological: Does not bruise/bleed easily.  Psychiatric/Behavioral: Negative for sleep disturbance. The patient is not nervous/anxious.      Physical Exam Updated Vital Signs BP 119/64   Pulse 71   Temp 98.1 F (36.7 C) (Oral)   Resp 16   Ht 5\' 6"  (1.676 m)   Wt 74.8 kg (165 lb)   SpO2 100%   BMI 26.63 kg/m   Physical Exam  Constitutional: She is oriented to person, place, and time. She appears well-developed and well-nourished. No distress.  HENT:  Head: Normocephalic and atraumatic.  Right Ear: Tympanic membrane, external ear and ear canal normal.  Left Ear: Tympanic membrane, external ear and ear canal normal.  Nose: Nose normal. No mucosal  edema or rhinorrhea.  Mouth/Throat: Uvula is midline. Uvula swelling ( Mild) present. Posterior oropharyngeal edema ( bilateral, moderate) present. No oropharyngeal exudate, posterior oropharyngeal erythema or tonsillar abscesses.  Eyes: Conjunctivae are normal.  Neck: Normal range of motion.  Patent airway No stridor; normal phonation Handling secretions without difficulty  Cardiovascular: Normal rate, normal heart sounds and intact distal pulses.   No murmur heard. Pulmonary/Chest: Effort normal and breath sounds normal. No stridor. No respiratory distress. She has no wheezes.  No wheezes or rhonchi  Abdominal: Soft. Bowel sounds are normal. There is no tenderness.  Musculoskeletal: Normal range of motion. She exhibits no edema.  Neurological: She is alert and oriented to person, place, and time.  Skin: Skin is warm and dry. No rash noted. She is not diaphoretic.  No rash, urticaria, petechiae or purpura  Psychiatric: She has a normal mood and affect.  Nursing note and  vitals reviewed.    ED Treatments / Results   Procedures Procedures (including critical care time)  Medications Ordered in ED Medications  EPINEPHrine (EPI-PEN) injection 0.3 mg (0.3 mg Intramuscular Given 06/23/17 2045)  famotidine (PEPCID) IVPB 20 mg premix (0 mg Intravenous Stopped 06/23/17 2157)  methylPREDNISolone sodium succinate (SOLU-MEDROL) 125 mg/2 mL injection 125 mg (125 mg Intravenous Given 06/23/17 2101)  acetaminophen (TYLENOL) tablet 1,000 mg (1,000 mg Oral Given 06/23/17 2333)  diphenhydrAMINE (BENADRYL) injection 25 mg (25 mg Intravenous Given 06/23/17 2336)     Initial Impression / Assessment and Plan / ED Course  I have reviewed the triage vital signs and the nursing notes.  Pertinent labs & imaging results that were available during my care of the patient were reviewed by me and considered in my medical decision making (see chart for details).  Clinical Course as of Jun 24 445  Sat Jun 23, 2017  2235 Pt reports she is feeling much better and is tolerating PO solids and liquids without difficulty  [HM]  2341 Pt continues to appear well.  She reports significant improvement.  She states minimal feeling of fullness in her throat.  Continues to tolerate PO without difficulty.  No stridor.  [HM]    Clinical Course User Index [HM] Xaivier Malay, Jarrett Soho, PA-C    Pt presents with presumed allergic reaction to shrimp.  Pt with moderate oropharyngeal swelling, but no additional symptoms of allergic reaction or second system to suggest anaphylaxis. No hypotension or AMS. Pt is very anxious. Discussed risk vs benefit of epi IM and pt wishes to proceed with this.  Sh was also given Pepcid and steroids.  Her airway is patent and she is handling her secretions without difficulty.  No vocal changes.    Patient re-evaluated prior to dc, is hemodynamically stable, in no respiratory distress.  Feeling of throat closing has resolved. Pt has been advised to take OTC benadryl, Pepcid and prednisone & return to the ED if they have a mod-severe allergic rxn (s/s including throat closing, difficulty breathing, swelling of lips face or tongue). Pt given Rx for Epi Pen.  Pt is to follow up with their PCP. Pt is agreeable with plan & verbalizes understanding.   Final Clinical Impressions(s) / ED Diagnoses   Final diagnoses:  Allergic reaction, initial encounter  Allergic reaction to food, initial encounter    New Prescriptions Discharge Medication List as of 06/23/2017 11:51 PM    START taking these medications   Details  diphenhydrAMINE (BENADRYL) 25 MG tablet Take 1 tablet (25 mg total) by mouth every 6 (six) hours as needed for itching (Rash)., Starting Sat 06/23/2017, Print    EPINEPHrine (EPIPEN 2-PAK) 0.3 mg/0.3 mL IJ SOAJ injection Inject 0.3 mLs (0.3 mg total) into the muscle once as needed (for severe allergic reaction). CAll 911 immediately if you have to use this medicine, Starting Sat  06/23/2017, Print    famotidine (PEPCID) 20 MG tablet Take 1 tablet (20 mg total) by mouth 2 (two) times daily., Starting Sat 06/23/2017, Print    predniSONE (DELTASONE) 20 MG tablet Take 2 tablets (40 mg total) by mouth daily., Starting Sat 06/23/2017, Print         Rhetta Cleek, G[place], PA-C 06/24/17 1610    Milton Ferguson, MD 06/24/17 1435

## 2017-06-23 NOTE — ED Triage Notes (Signed)
Pt just have some shrimp for diner today and started feeling SOB, sore throat and tingling on her lips, pt never had allergies to shrimp before, she states she got 2 Benadryl pta to ED, she is very anxious on triage some swollen noticed back on her throat, SPO2 100% on RA, NAD noticed.

## 2017-06-23 NOTE — ED Notes (Signed)
Pt requesting pain medicine, her leg is sore from Epi-Pen. EDP aware, order for Tylenol

## 2017-06-23 NOTE — ED Notes (Signed)
Pt provided with coke.

## 2017-06-24 ENCOUNTER — Encounter (HOSPITAL_COMMUNITY): Payer: Self-pay | Admitting: Emergency Medicine

## 2017-06-24 ENCOUNTER — Emergency Department (HOSPITAL_COMMUNITY)
Admission: EM | Admit: 2017-06-24 | Discharge: 2017-06-24 | Disposition: A | Discharge status: Home / Self Care | Payer: BLUE CROSS/BLUE SHIELD | Source: Home / Self Care | Attending: Emergency Medicine | Admitting: Emergency Medicine

## 2017-06-24 DIAGNOSIS — J45909 Unspecified asthma, uncomplicated: Secondary | ICD-10-CM

## 2017-06-24 DIAGNOSIS — Z79899 Other long term (current) drug therapy: Secondary | ICD-10-CM

## 2017-06-24 DIAGNOSIS — R200 Anesthesia of skin: Secondary | ICD-10-CM

## 2017-06-24 DIAGNOSIS — F419 Anxiety disorder, unspecified: Secondary | ICD-10-CM | POA: Insufficient documentation

## 2017-06-24 DIAGNOSIS — R110 Nausea: Secondary | ICD-10-CM

## 2017-06-24 DIAGNOSIS — R221 Localized swelling, mass and lump, neck: Secondary | ICD-10-CM

## 2017-06-24 DIAGNOSIS — T7840XD Allergy, unspecified, subsequent encounter: Secondary | ICD-10-CM

## 2017-06-24 DIAGNOSIS — I10 Essential (primary) hypertension: Secondary | ICD-10-CM | POA: Insufficient documentation

## 2017-06-24 LAB — CBC WITH DIFFERENTIAL/PLATELET
Basophils Absolute: 0 10*3/uL (ref 0.0–0.1)
Basophils Relative: 0 %
Eosinophils Absolute: 0 10*3/uL (ref 0.0–0.7)
Eosinophils Relative: 0 %
HCT: 38.7 % (ref 36.0–46.0)
Hemoglobin: 12.9 g/dL (ref 12.0–15.0)
Lymphocytes Relative: 11 %
Lymphs Abs: 1.1 10*3/uL (ref 0.7–4.0)
MCH: 27.6 pg (ref 26.0–34.0)
MCHC: 33.3 g/dL (ref 30.0–36.0)
MCV: 82.9 fL (ref 78.0–100.0)
Monocytes Absolute: 0.2 10*3/uL (ref 0.1–1.0)
Monocytes Relative: 2 %
Neutro Abs: 8.7 10*3/uL — ABNORMAL HIGH (ref 1.7–7.7)
Neutrophils Relative %: 87 %
Platelets: 288 10*3/uL (ref 150–400)
RBC: 4.67 MIL/uL (ref 3.87–5.11)
RDW: 14.6 % (ref 11.5–15.5)
WBC: 10.1 10*3/uL (ref 4.0–10.5)

## 2017-06-24 LAB — COMPREHENSIVE METABOLIC PANEL
ALT: 20 U/L (ref 14–54)
AST: 17 U/L (ref 15–41)
Albumin: 4.7 g/dL (ref 3.5–5.0)
Alkaline Phosphatase: 65 U/L (ref 38–126)
Anion gap: 10 (ref 5–15)
BUN: 14 mg/dL (ref 6–20)
CO2: 22 mmol/L (ref 22–32)
Calcium: 10 mg/dL (ref 8.9–10.3)
Chloride: 103 mmol/L (ref 101–111)
Creatinine, Ser: 0.72 mg/dL (ref 0.44–1.00)
GFR calc Af Amer: >60 mL/min (ref >60)
GFR calc non Af Amer: >60 mL/min (ref >60)
Glucose, Bld: 113 mg/dL — ABNORMAL HIGH (ref 65–99)
Potassium: 4.2 mmol/L (ref 3.5–5.1)
Sodium: 135 mmol/L (ref 135–145)
Total Bilirubin: 0.6 mg/dL (ref 0.3–1.2)
Total Protein: 9.1 g/dL — ABNORMAL HIGH (ref 6.5–8.1)

## 2017-06-24 LAB — LITHIUM LEVEL: Lithium Lvl: 0.81 mmol/L (ref 0.60–1.20)

## 2017-06-24 MED ORDER — DIPHENHYDRAMINE HCL 25 MG PO CAPS
50.0000 mg | ORAL_CAPSULE | Freq: Once | ORAL | Status: DC
  Filled 2017-06-24: qty 2

## 2017-06-24 MED ORDER — PREDNISONE 20 MG PO TABS
40.0000 mg | ORAL_TABLET | Freq: Once | ORAL | Status: AC
  Administered 2017-06-24: 40 mg via ORAL
  Filled 2017-06-24: qty 2

## 2017-06-24 NOTE — Discharge Instructions (Signed)
Take the prednisone, pepcid, benadryl, Get the Epi pen filled  If you have worsening symptoms come back immediately.

## 2017-06-24 NOTE — ED Notes (Signed)
Lab at bedside

## 2017-06-24 NOTE — ED Notes (Signed)
Pt states she had 50 mg of benadryl at 0830 today. MD made aware.

## 2017-06-24 NOTE — ED Provider Notes (Signed)
[facility] DEPT Provider Note   CSN: [id number] Arrival date & time: 06/24/17  0944     History   Chief Complaint Chief Complaint  Patient presents with  . Allergic Reaction    HPI Kathy Zimmerman is a 26 y.o. female.  HPI  The patient is a 26 year old female, she has a history significant for postpartum depression which occurred after her most recent delivery, she has been on lithium for the last month or so and states that she had a level checked approximately one week after starting the medication which was normal. She states that the medicine is helping with her mood and she has not been depressed at all since starting it. She reports that last night she had an all-you-can-eat shrimp meal at [employer], she reports that after that she developed some increased swelling in her throat, feeling like she was having tingling of her lips and face and having  a feeling of being anxious and unsettled. There is no rashes, no itching, no wheezing, no vomiting.  The patient was given EpiPen, Benadryl, Solu-Medrol, Pepcid and did very well. She states that she felt almost immediate improvement in this morning when she woke up she felt like she still had some swelling in her throat as well as tingling of the face though it does not seem to be as bad. She wanted to be rechecked and wanted to make sure this was not lithium toxicity. She denies abdominal pain but does feel a little bit nauseated. She has no fevers, no lightheadedness, no syncope, no swelling, no diarrhea, no coughing or shortness of breath. She has not taken her morning medications with regards to this allergic reaction.   Past Medical History:  Diagnosis Date  . Anxiety   . Asthma   . Headache   . IBS (irritable bowel syndrome)   . Panic attack   . Preeclampsia     Patient Active Problem List   Diagnosis Date Noted  . Hypertension 05/30/2017  . POTS (postural orthostatic tachycardia syndrome) 05/29/2017  .  History of 2 cesarean sections 03/20/2016  . Hx of severe preeclampsia 03/20/2016  . Atypical chest pain 05/31/2015  . Anxiety 04/11/2015  . Retinal detachment of left eye with retinal break 08/08/2011    Class: Acute    Past Surgical History:  Procedure Laterality Date  . CESAREAN SECTION N/A 04/10/2015   Procedure: CESAREAN SECTION;  Surgeon: Crawford Givens, MD;  Location: [facility];  Service: Obstetrics;  Laterality: N/A;  . EYE SURGERY     laser  . SCLERAL BUCKLE  08/09/2011   Procedure: SCLERAL BUCKLE;  Surgeon: Clent Demark Rankin;  Location: [facility];  Service: Ophthalmology;  Laterality: Left;  with cryo  . TENDON REPAIR      OB History    Gravida Para Term Preterm AB Living   2 2   2   2    SAB TAB Ectopic Multiple Live Births         0 2       Home Medications    Prior to Admission medications   Medication Sig Start Date End Date Taking? Authorizing Provider  atenolol (TENORMIN) 25 MG tablet Take 12.5 mg by mouth 2 (two) times daily.   Yes [provider]  diphenhydrAMINE (BENADRYL) 25 MG tablet Take 1 tablet (25 mg total) by mouth every 6 (six) hours as needed for itching (Rash). 06/23/17  Yes Muthersbaugh, Jarrett Soho, PA-C  EPINEPHrine (EPIPEN 2-PAK) 0.3 mg/0.3 mL IJ SOAJ injection  Inject 0.3 mLs (0.3 mg total) into the muscle once as needed (for severe allergic reaction). CAll 911 immediately if you have to use this medicine 06/23/17  Yes Muthersbaugh, Jarrett Soho, PA-C  famotidine (PEPCID) 20 MG tablet Take 1 tablet (20 mg total) by mouth 2 (two) times daily. 06/23/17  Yes Muthersbaugh, Jarrett Soho, PA-C  gabapentin (NEURONTIN) 100 MG capsule Take 100 mg by mouth daily as needed (for anxiety).  05/25/17 06/24/17 Yes [provider]  lithium carbonate 300 MG capsule Take 300 mg by mouth 3 (three) times daily with meals.   Yes [provider]  medroxyPROGESTERone (DEPO-PROVERA) 150 MG/ML injection Inject 150 mg into the muscle See admin instructions. IM every 3 months.  Last injection dose May 14, 2017.   Yes [provider]  predniSONE (DELTASONE) 20 MG tablet Take 2 tablets (40 mg total) by mouth daily. 06/23/17  Yes Muthersbaugh, Jarrett Soho, PA-C  PROAIR HFA 108 (90 Base) MCG/ACT inhaler INHALE 1 TO 2 PUFFS PO Q 4 TO 6 H PRN FOR SOB AND WHEEZING 11/10/16  Yes [provider]    Family History Family History  Problem Relation Age of Onset  . Depression Father   . Depression Sister   . Mental illness Sister   . Depression Maternal Grandmother   . Mental illness Maternal Grandmother   . Thyroid disease Maternal Grandmother   . Heart disease Maternal Grandfather   . Hypertension Maternal Grandfather   . Hyperlipidemia Maternal Grandfather   . CAD Maternal Grandfather   . Thyroid disease Other   . CAD Other     Social History Social History  Substance Use Topics  . Smoking status: Never Smoker  . Smokeless tobacco: Never Used  . Alcohol use No     Allergies   Shrimp [shellfish allergy] and Sulfa antibiotics   Review of Systems Review of Systems  All other systems reviewed and are negative.    Physical Exam Updated Vital Signs BP (!) 104/58   Pulse 66   Temp 98.7 F (37.1 C) (Oral)   Resp 16   Ht 5\' 6"  (1.676 m)   Wt 74.8 kg (165 lb)   SpO2 99%   BMI 26.63 kg/m   Physical Exam  Constitutional: She appears well-developed and well-nourished. No distress.  HENT:  Head: Normocephalic and atraumatic.  Mouth/Throat: Oropharynx is clear and moist. No oropharyngeal exudate.  The oropharynx is clear and moist, there is no signs of any swelling, phonation is totally normal, tonsils are not hypertrophied, posterior soft tissue and palate appear normal, tongue is normal, able to swallow without difficulty, no trismus or torticollis, normal range of motion of the jaw and the neck  Eyes: Pupils are equal, round, and reactive to light. Conjunctivae and EOM are normal. Right eye exhibits no discharge. Left eye exhibits no  discharge. No scleral icterus.  Neck: Normal range of motion. Neck supple. No JVD present. No thyromegaly present.  Cardiovascular: Normal rate, regular rhythm, normal heart sounds and intact distal pulses.  Exam reveals no gallop and no friction rub.   No murmur heard. Pulmonary/Chest: Effort normal and breath sounds normal. No respiratory distress. She has no wheezes. She has no rales.  Totally normal lung sounds without any expiration wheezing, no wheezing on forced expiration, no tachypnea, speaks in full sentences without difficulty  Abdominal: Soft. Bowel sounds are normal. She exhibits no distension and no mass. There is no tenderness.  No abdominal tenderness to palpation, no distention, no guarding, no hepatosplenomegaly  Musculoskeletal:  Normal range of motion. She exhibits no edema or tenderness.  Lymphadenopathy:    She has no cervical adenopathy.  Neurological: She is alert. Coordination normal.  The patient has normal speech, coordination, strength in all 4 extremities in memory. Gait is normal  Skin: Skin is warm and dry. No rash noted. No erythema.  Psychiatric: She has a normal mood and affect. Her behavior is normal.  Nursing note and vitals reviewed.    ED Treatments / Results  Labs (all labs ordered are listed, but only abnormal results are displayed) Labs Reviewed  COMPREHENSIVE METABOLIC PANEL - Abnormal; Notable for the following:       Result Value   Glucose, Bld 113 (*)    Total Protein 9.1 (*)    All other components within normal limits  CBC WITH DIFFERENTIAL/PLATELET - Abnormal; Notable for the following:    Neutro Abs 8.7 (*)    All other components within normal limits  LITHIUM LEVEL     Radiology No results found.  Procedures Procedures (including critical care time)  Medications Ordered in ED Medications  predniSONE (DELTASONE) tablet 40 mg (40 mg Oral Given 06/24/17 1016)     Initial Impression / Assessment and Plan / ED Course  I have  reviewed the triage vital signs and the nursing notes.  Pertinent labs & imaging results that were available during my care of the patient were reviewed by me and considered in my medical decision making (see chart for details).     The patient has a totally normal exam at this time, she still has some subjective paresthesias of the lips and a feeling of swelling in her throat though her phonation is normal and her oropharynx is totally clear. She will get second dose of steroid from last night, Benadryl this morning, check lithium level and metabolic panel.  Spouse was not sick after eating same food  Labs reviewed, stable appearing for discharge, states that she has improved, she has no visible swelling or need for acute intervention, she expressed her understanding for the indications for return.  Final Clinical Impressions(s) / ED Diagnoses   Final diagnoses:  Allergic reaction, subsequent encounter    New Prescriptions New Prescriptions   No medications on file     Noemi Chapel, MD 06/24/17 1151

## 2017-06-24 NOTE — ED Triage Notes (Signed)
Pt reports being seen at [facility] last night for allergic reaction to shrimp.  States feels like throat is swelling again.  Did not use epi pen given as rx was not filled.  Took Benadryl 50mg  at 0830.

## 2017-07-12 DIAGNOSIS — R42 Dizziness and giddiness: Secondary | ICD-10-CM | POA: Insufficient documentation

## 2017-07-26 ENCOUNTER — Encounter: Payer: Self-pay | Admitting: Neurology

## 2017-07-26 ENCOUNTER — Ambulatory Visit: Payer: Self-pay | Admitting: Neurology

## 2017-07-26 ENCOUNTER — Telehealth: Payer: Self-pay | Admitting: Neurology

## 2017-07-26 NOTE — Telephone Encounter (Signed)
This patient did not show for a new patient appointment today. 

## 2017-07-27 ENCOUNTER — Ambulatory Visit: Payer: BLUE CROSS/BLUE SHIELD | Admitting: Neurology

## 2017-08-20 ENCOUNTER — Ambulatory Visit: Payer: BLUE CROSS/BLUE SHIELD | Admitting: Women's Health

## 2017-08-30 ENCOUNTER — Ambulatory Visit (INDEPENDENT_AMBULATORY_CARE_PROVIDER_SITE_OTHER): Payer: BLUE CROSS/BLUE SHIELD | Admitting: Women's Health

## 2017-08-30 ENCOUNTER — Encounter: Payer: Self-pay | Admitting: Women's Health

## 2017-08-30 VITALS — BP 118/68 | HR 80 | Ht 66.0 in | Wt 164.0 lb

## 2017-08-30 DIAGNOSIS — Z309 Encounter for contraceptive management, unspecified: Secondary | ICD-10-CM

## 2017-08-30 DIAGNOSIS — R6882 Decreased libido: Secondary | ICD-10-CM | POA: Diagnosis not present

## 2017-08-30 DIAGNOSIS — Z3009 Encounter for other general counseling and advice on contraception: Secondary | ICD-10-CM

## 2017-08-30 DIAGNOSIS — F418 Other specified anxiety disorders: Secondary | ICD-10-CM

## 2017-08-30 NOTE — Progress Notes (Signed)
GYN VISIT Patient name: Kathy Zimmerman MRN [id number]  Date of birth: December 26, 1990 Chief Complaint:   Contraception  History of Present Illness:   Kathy Zimmerman is a 26 y.o. [phone] Caucasian female being seen today for needing to get depo shot. Still has current rx and has depo w/ her, RN friend has been giving it to her, but was unable to this last time. She is past due for it- last injection was 9/11. Has not had sex since Sept or Oct, and was April prior to that. Does not have any sex drive at all. Does have severe depression/anxiety, has had to be admitted twice since birth of child 1 year ago.  Sees therapist at [facility] q 3wks.  Is currently on lithium and seroquel, but states they are having a hard time getting depression/anxiety under control. Denies SI/HI/II. Last time she tried to have sex in Sept or Oct, they had to stop b/c it was too painful for him to insert. Pt states it has been this way in the past, but is getting worse. Tried again w/ lubrication and still was not able to. It is affecting their marriage. She has not really talked to her therapist about this.  Discussed coming off of depo d/t depo potentially making dep/anx worse. Discussed nexplanon can also worsen depression, so talked about CHC vs. IUD. She does not want IUD. Does not smoke, no h/o HTN, DVT/PE, CVA, MI. However, she does report migraines w/ flashing lights- was supposed to see neurologist, but never did. Since not currently sexually active, discussed coming off contraception completely right now to see if depression/anxiety improve. Can see neurologist to see if truly migraines w/ aura- if not can try [facility] if she would like- she is interested in patches. If does have sex right now, can use condoms, but highly doubts she will have sex.   No LMP recorded. Patient has had an injection. The current method of family planning is Depo-Provera injections. Last pap 12/02/14. Results were:  normal Review of Systems:   Pertinent  items are noted in HPI Denies fever/chills, dizziness, headaches, visual disturbances, fatigue, shortness of breath, chest pain, abdominal pain, vomiting, abnormal vaginal discharge/itching/odor/irritation, problems with periods, bowel movements, urination, or intercourse unless otherwise stated above.  Pertinent History Reviewed:  Reviewed past medical,surgical, social, obstetrical and family history.  Reviewed problem list, medications and allergies. Physical Assessment:   Vitals:   08/30/17 1607  BP: 118/68  Pulse: 80  Weight: 164 lb (74.4 kg)  Height: 5\' 6"  (1.676 m)  Body mass index is 26.47 kg/m.       Physical Examination:   General appearance: alert, well appearing, and in no distress  Mental status: alert, oriented to person, place, and time  Skin: warm & dry   Cardiovascular: normal heart rate noted  Respiratory: normal respiratory effort, no distress  Abdomen: soft, non-tender   Pelvic: exam declined by the patient  Extremities: no edema   No results found for this or any previous visit (from the past 24 hour(s)).  Assessment & Plan:  1) Contraception counseling> come off of depo to see if dep/anx improve, see neurologist to see if she is truly having migraines w/ aura- if not, can try Ortho-Evra patches if she would like  2) Decreased libido/possible vaginismus> discussed option of sex therapist and pelvic floor PT- pt is interested, will investigate this further and get back in touch w/ pt  3) Dep/anx> continue seeing therapist at [facility] q 3wks,  mention decreased libido/sex problems w/ her as well, as she may know a good sex therapist that can help  No orders of the defined types were placed in this encounter.   Return for March for , Physical.  Tawnya Crook CNM, [facility] 08/30/2017 5:15 PM

## 2017-09-06 ENCOUNTER — Other Ambulatory Visit: Payer: Self-pay | Admitting: Women's Health

## 2017-09-06 DIAGNOSIS — N942 Vaginismus: Secondary | ICD-10-CM

## 2017-09-06 DIAGNOSIS — N941 Unspecified dyspareunia: Secondary | ICD-10-CM

## 2017-09-06 MED ORDER — NORETHINDRONE 0.35 MG PO TABS
1.0000 | ORAL_TABLET | Freq: Every day | ORAL | 3 refills | Status: DC
Start: 2017-09-06 — End: 2018-03-10

## 2017-09-06 NOTE — Progress Notes (Signed)
Pt called, wants micronor for now. Also requests referral to neurologist in [place]- message sent to Tish to do referral for [facility] Neuro d/t migraines. Order also placed for pelvic floor PT w/ [facility].  Roma Schanz, CNM, [facility] 09/06/2017 1:52 PM

## 2017-09-07 ENCOUNTER — Other Ambulatory Visit: Payer: Self-pay | Admitting: *Deleted

## 2017-10-25 DIAGNOSIS — F411 Generalized anxiety disorder: Secondary | ICD-10-CM | POA: Insufficient documentation

## 2017-11-07 ENCOUNTER — Encounter (HOSPITAL_COMMUNITY): Payer: Self-pay | Admitting: Emergency Medicine

## 2017-11-07 ENCOUNTER — Emergency Department (HOSPITAL_COMMUNITY)
Admission: EM | Admit: 2017-11-07 | Discharge: 2017-11-07 | Disposition: A | Discharge status: Home / Self Care | Payer: BLUE CROSS/BLUE SHIELD | Attending: Emergency Medicine | Admitting: Emergency Medicine

## 2017-11-07 ENCOUNTER — Other Ambulatory Visit: Payer: Self-pay

## 2017-11-07 DIAGNOSIS — Z5321 Procedure and treatment not carried out due to patient leaving prior to being seen by health care provider: Secondary | ICD-10-CM | POA: Diagnosis not present

## 2017-11-07 DIAGNOSIS — R0981 Nasal congestion: Secondary | ICD-10-CM | POA: Insufficient documentation

## 2017-11-07 HISTORY — DX: Depression, unspecified: F32.A

## 2017-11-07 HISTORY — DX: Major depressive disorder, single episode, unspecified: F32.9

## 2017-11-07 NOTE — ED Notes (Signed)
Called x 2 no answer

## 2017-11-07 NOTE — ED Triage Notes (Signed)
Flu like symptoms since 3pm today. Kids sick with same.

## 2017-11-08 ENCOUNTER — Telehealth: Payer: Self-pay | Admitting: *Deleted

## 2017-11-08 NOTE — Telephone Encounter (Signed)
Referral was sent to [facility]. No appointments until April.

## 2017-11-09 ENCOUNTER — Telehealth: Payer: Self-pay | Admitting: Cardiovascular Disease

## 2017-11-09 NOTE — Telephone Encounter (Signed)
Returned pt call. No answer. Left message for pt to return call.  

## 2017-11-09 NOTE — Telephone Encounter (Signed)
Pt lvm stating she's having problems w/ her BP...would like to speak w/ nurse

## 2017-11-12 ENCOUNTER — Other Ambulatory Visit: Payer: BLUE CROSS/BLUE SHIELD | Admitting: Women's Health

## 2017-11-13 NOTE — Telephone Encounter (Signed)
Called pt, No answer. Left message for pt to return call.  

## 2017-12-07 ENCOUNTER — Encounter (HOSPITAL_COMMUNITY): Payer: Self-pay | Admitting: Emergency Medicine

## 2017-12-07 ENCOUNTER — Emergency Department (HOSPITAL_COMMUNITY): Payer: BLUE CROSS/BLUE SHIELD

## 2017-12-07 ENCOUNTER — Other Ambulatory Visit: Payer: Self-pay

## 2017-12-07 ENCOUNTER — Emergency Department (HOSPITAL_COMMUNITY)
Admission: EM | Admit: 2017-12-07 | Discharge: 2017-12-07 | Disposition: A | Discharge status: Home / Self Care | Payer: BLUE CROSS/BLUE SHIELD | Attending: Emergency Medicine | Admitting: Emergency Medicine

## 2017-12-07 DIAGNOSIS — Z79899 Other long term (current) drug therapy: Secondary | ICD-10-CM | POA: Diagnosis not present

## 2017-12-07 DIAGNOSIS — F329 Major depressive disorder, single episode, unspecified: Secondary | ICD-10-CM | POA: Diagnosis not present

## 2017-12-07 DIAGNOSIS — R079 Chest pain, unspecified: Secondary | ICD-10-CM | POA: Diagnosis present

## 2017-12-07 DIAGNOSIS — J45909 Unspecified asthma, uncomplicated: Secondary | ICD-10-CM | POA: Insufficient documentation

## 2017-12-07 DIAGNOSIS — T7840XA Allergy, unspecified, initial encounter: Secondary | ICD-10-CM | POA: Insufficient documentation

## 2017-12-07 DIAGNOSIS — F419 Anxiety disorder, unspecified: Secondary | ICD-10-CM | POA: Insufficient documentation

## 2017-12-07 DIAGNOSIS — I10 Essential (primary) hypertension: Secondary | ICD-10-CM | POA: Insufficient documentation

## 2017-12-07 DIAGNOSIS — R091 Pleurisy: Secondary | ICD-10-CM | POA: Insufficient documentation

## 2017-12-07 MED ORDER — CLONAZEPAM 0.5 MG PO TABS
1.0000 mg | ORAL_TABLET | Freq: Once | ORAL | Status: AC
  Administered 2017-12-07: 1 mg via ORAL
  Filled 2017-12-07: qty 2

## 2017-12-07 NOTE — ED Triage Notes (Signed)
Pt reports throat hurts, "chest hurts when I breathe". Pt reports her lips were tingling but that has stopped after taking Benadryl since 0100

## 2017-12-07 NOTE — ED Provider Notes (Signed)
[facility] EMERGENCY DEPARTMENT Provider Note   CSN: [id number] Arrival date & time: 12/07/17  0228     History   Chief Complaint Chief Complaint  Patient presents with  . Allergic Reaction    HPI Kathy Zimmerman is a 27 y.o. female.  The history is provided by the patient.  Allergic Reaction  Presenting symptoms: difficulty swallowing   Presenting symptoms: no rash   Severity:  Moderate Prior allergic episodes:  Food/nut allergies Relieved by:  Antihistamines Worsened by:  Nothing Patient with history of asthma, anxiety, allergic reactions presents with allergic reaction and chest pain She reports prior to arrival she began having swelling in her throat and "tingling" in her lips.  She reports taking Benadryl and symptoms are improved.  No vomiting or diarrhea.  No rash.  However she is now having chest pain.  She reports the pain is in her upper chest, sharp and seems reports at times with breathing and movement.  SHe is a non-smoker.  She is no longer on contraceptives She recently cut back on her lithium dosing, and has psychiatry follow-up later in April  She reports feeling very anxious at this time Past Medical History:  Diagnosis Date  . Anxiety   . Asthma   . Depression   . Headache   . IBS (irritable bowel syndrome)   . Panic attack   . Preeclampsia     Patient Active Problem List   Diagnosis Date Noted  . Hypertension 05/30/2017  . POTS (postural orthostatic tachycardia syndrome) 05/29/2017  . History of 2 cesarean sections 03/20/2016  . Hx of severe preeclampsia 03/20/2016  . Atypical chest pain 05/31/2015  . Anxiety 04/11/2015  . Retinal detachment of left eye with retinal break 08/08/2011    Class: Acute    Past Surgical History:  Procedure Laterality Date  . CESAREAN SECTION N/A 04/10/2015   Procedure: CESAREAN SECTION;  Surgeon: Crawford Givens, MD;  Location: [facility];  Service: Obstetrics;  Laterality: N/A;  . EYE SURGERY     laser  .  SCLERAL BUCKLE  08/09/2011   Procedure: SCLERAL BUCKLE;  Surgeon: Clent Demark Rankin;  Location: [facility];  Service: Ophthalmology;  Laterality: Left;  with cryo  . TENDON REPAIR       OB History    Gravida  2   Para  2   Term      Preterm  2   AB      Living  2     SAB      TAB      Ectopic      Multiple  0   Live Births  2            Home Medications    Prior to Admission medications   Medication Sig Start Date End Date Taking? Authorizing Provider  atenolol (TENORMIN) 25 MG tablet Take 12.5 mg by mouth 2 (two) times daily.    [provider]  diphenhydrAMINE (BENADRYL) 25 MG tablet Take 1 tablet (25 mg total) by mouth every 6 (six) hours as needed for itching (Rash). 06/23/17   Muthersbaugh, Jarrett Soho, PA-C  EPINEPHrine (EPIPEN 2-PAK) 0.3 mg/0.3 mL IJ SOAJ injection Inject 0.3 mLs (0.3 mg total) into the muscle once as needed (for severe allergic reaction). CAll 911 immediately if you have to use this medicine 06/23/17   Muthersbaugh, Jarrett Soho, PA-C  famotidine (PEPCID) 20 MG tablet Take 1 tablet (20 mg total) by mouth 2 (two) times daily. Patient not taking:  Reported on 08/30/2017 06/23/17   Muthersbaugh, Jarrett Soho, PA-C  gabapentin (NEURONTIN) 100 MG capsule Take 100 mg by mouth daily as needed (for anxiety).  05/25/17 06/24/17  [provider]  lithium carbonate 300 MG capsule Take 300 mg by mouth 3 (three) times daily with meals.    [provider]  medroxyPROGESTERone (DEPO-PROVERA) 150 MG/ML injection Inject 150 mg into the muscle See admin instructions. IM every 3 months. Last injection dose May 14, 2017.    [provider]  norethindrone (MICRONOR,CAMILA,ERRIN) 0.35 MG tablet Take 1 tablet (0.35 mg total) by mouth daily. 09/06/17   Roma Schanz, CNM  predniSONE (DELTASONE) 20 MG tablet Take 2 tablets (40 mg total) by mouth daily. Patient not taking: Reported on 08/30/2017 06/23/17   Muthersbaugh, Jarrett Soho, PA-C  PROAIR HFA 108 (90  Base) MCG/ACT inhaler INHALE 1 TO 2 PUFFS PO Q 4 TO 6 H PRN FOR SOB AND WHEEZING 11/10/16   [provider]  QUEtiapine (SEROQUEL) 25 MG tablet Take 25 mg by mouth at bedtime.    [provider]    Family History Family History  Problem Relation Age of Onset  . Depression Father   . Depression Sister   . Mental illness Sister   . Depression Maternal Grandmother   . Mental illness Maternal Grandmother   . Thyroid disease Maternal Grandmother   . Heart disease Maternal Grandfather   . Hypertension Maternal Grandfather   . Hyperlipidemia Maternal Grandfather   . CAD Maternal Grandfather   . Thyroid disease Other   . CAD Other     Social History Social History   Tobacco Use  . Smoking status: Never Smoker  . Smokeless tobacco: Never Used  Substance Use Topics  . Alcohol use: No  . Drug use: No     Allergies   Shrimp [shellfish allergy] and Sulfa antibiotics   Review of Systems Review of Systems  Constitutional: Negative for fever.  HENT: Positive for trouble swallowing.   Respiratory: Positive for shortness of breath.   Cardiovascular: Positive for chest pain.  Gastrointestinal: Negative for vomiting.  Skin: Negative for rash.  Psychiatric/Behavioral: The patient is nervous/anxious.   All other systems reviewed and are negative.    Physical Exam Updated Vital Signs BP (!) 149/96 (BP Location: Right Arm)   Pulse 80   Temp 98.7 F (37.1 C) (Oral)   Resp 14   Ht 1.676 m (5\' 6" )   Wt 77.1 kg (170 lb)   SpO2 100%   BMI 27.44 kg/m   Physical Exam CONSTITUTIONAL: Well developed/well nourished, anxious HEAD: Normocephalic/atraumatic EYES: EOMI/PERRL ENMT: Mucous membranes moist no angioedema NECK: supple no meningeal signs SPINE/BACK:entire spine nontender CV: S1/S2 noted, no murmurs/rubs/gallops noted LUNGS: Lungs are clear to auscultation bilaterally, no apparent distress Chest-mild tenderness to central chest, no crepitus ABDOMEN: soft,  nontender, no rebound or guarding, bowel sounds noted throughout abdomen GU:no cva tenderness NEURO: Pt is awake/alert/appropriate, moves all extremitiesx4.  No facial droop.  Tremor noted in each hand EXTREMITIES: pulses normal/equal, full ROM, no lower extremity edema or tenderness SKIN: warm, color normal PSYCH: Significant anxiety noted significant anxiety noted  ED Treatments / Results  Labs (all labs ordered are listed, but only abnormal results are displayed) Labs Reviewed - No data to display  EKG EKG Interpretation  Date/Time:  Friday December 07 2017 04:22:37 EDT Ventricular Rate:  80 PR Interval:    QRS Duration: 97 QT Interval:  384 QTC Calculation: 443 R Axis:   54  Text Interpretation:  Sinus rhythm Low voltage, precordial leads No significant change since last tracing Confirmed by Ripley Fraise [phone]) on 12/07/2017 4:29:37 AM   Radiology Dg Chest 2 View  Result Date: 12/07/2017 CLINICAL DATA:  27 year old female with chest pain. EXAM: CHEST - 2 VIEW COMPARISON:  Chest radiograph dated 02/15/2017 FINDINGS: The heart size and mediastinal contours are within normal limits. Both lungs are clear. The visualized skeletal structures are unremarkable. IMPRESSION: No active cardiopulmonary disease. Electronically Signed   By: Anner Crete M.D.   On: 12/07/2017 04:38    Procedures Procedures (including critical care time)  Medications Ordered in ED Medications  clonazePAM (KLONOPIN) tablet 1 mg (1 mg Oral Given 12/07/17 0415)     Initial Impression / Assessment and Plan / ED Course  I have reviewed the triage vital signs and the nursing notes.  Pertinent  imaging results that were available during my care of the patient were reviewed by me and considered in my medical decision making (see chart for details).     Patient presents for 2 complaints.  First she states she had allergic reaction is already improving.  No rash, no angioedema noted Also reports chest  pain.  EKG and chest x-ray is unremarkable.  She reports feeling tingling all over and very anxious. She has had extensive workup for chest pain previously, and has had CT angios chest done at least 4 times to rule out a PE.  No history of PE per those CTs 6:02 AM Patient is improved.  No hypoxia noted.  No tachycardia.  No signs of allergic reaction at this time.  No signs of anaphylaxis.  She reports mild pain with breathing, but overall feels well.  No hemoptysis reported.  My suspicion for acute PE/ACS/dissection is low  Strong suspicion anxiety is playing a role.  After long discussion, patient feels very comfortable going home.  She understands that she has had multiple CT scans in the past.  She does not wish to have any further testing. Final Clinical Impressions(s) / ED Diagnoses   Final diagnoses:  Allergic reaction, initial encounter  Pleurisy    ED Discharge Orders    None       Ripley Fraise, MD 12/07/17 [phone]

## 2018-02-20 ENCOUNTER — Encounter (HOSPITAL_COMMUNITY): Payer: Self-pay | Admitting: Emergency Medicine

## 2018-02-20 ENCOUNTER — Emergency Department (HOSPITAL_COMMUNITY)
Admission: EM | Admit: 2018-02-20 | Discharge: 2018-02-20 | Disposition: A | Discharge status: Home / Self Care | Payer: Medicaid Other | Attending: Emergency Medicine | Admitting: Emergency Medicine

## 2018-02-20 ENCOUNTER — Emergency Department (HOSPITAL_COMMUNITY): Payer: Medicaid Other

## 2018-02-20 ENCOUNTER — Other Ambulatory Visit: Payer: Self-pay

## 2018-02-20 DIAGNOSIS — Y929 Unspecified place or not applicable: Secondary | ICD-10-CM | POA: Insufficient documentation

## 2018-02-20 DIAGNOSIS — I10 Essential (primary) hypertension: Secondary | ICD-10-CM | POA: Insufficient documentation

## 2018-02-20 DIAGNOSIS — Z79899 Other long term (current) drug therapy: Secondary | ICD-10-CM | POA: Insufficient documentation

## 2018-02-20 DIAGNOSIS — W208XXA Other cause of strike by thrown, projected or falling object, initial encounter: Secondary | ICD-10-CM | POA: Insufficient documentation

## 2018-02-20 DIAGNOSIS — Y939 Activity, unspecified: Secondary | ICD-10-CM | POA: Insufficient documentation

## 2018-02-20 DIAGNOSIS — Y999 Unspecified external cause status: Secondary | ICD-10-CM | POA: Insufficient documentation

## 2018-02-20 DIAGNOSIS — S9032XA Contusion of left foot, initial encounter: Secondary | ICD-10-CM | POA: Insufficient documentation

## 2018-02-20 NOTE — ED Triage Notes (Signed)
Pt had ironing board fall onto left foot x 2 weeks ago. States still having pain to top of foot/bottom of foot and now having pain shoot up calf. No obvious deformity or bruising or swelling noted.

## 2018-02-20 NOTE — Discharge Instructions (Addendum)
Wear the ace wrap and post op shoe for walking or standing.  Continue ibuprofen.  Call the podiatrist listed in one week to arrange a follow-up appt if not improving.

## 2018-02-20 NOTE — ED Provider Notes (Signed)
[facility] EMERGENCY DEPARTMENT Provider Note   CSN: [id number] Arrival date & time: 02/20/18  1633     History   Chief Complaint Chief Complaint  Patient presents with  . Foot Pain    HPI Jaella Weinert [place]ee is a 27 y.o. female.  HPI   Makinna Andy Peplinski is a 27 y.o. female who presents to the Emergency Department complaining of persistent left foot pain for 3 weeks.  Reports a direct blow to the top of her foot from a falling ironing board.  She describes a throbbing pain across the top of her foot that is now associated with weight bearing and flexion of her foot.  Pain occasionally radiates into her lower leg as well.  She admits that her lower leg began hurting after walking on the side of her foot.  She denies open wounds, swelling, redness, bruising of her foot.  She has been taking ibuprofen with minimal relief.    Past Medical History:  Diagnosis Date  . Anxiety   . Asthma   . Depression   . Headache   . IBS (irritable bowel syndrome)   . Panic attack   . Preeclampsia     Patient Active Problem List   Diagnosis Date Noted  . Hypertension 05/30/2017  . POTS (postural orthostatic tachycardia syndrome) 05/29/2017  . History of 2 cesarean sections 03/20/2016  . Hx of severe preeclampsia 03/20/2016  . Atypical chest pain 05/31/2015  . Anxiety 04/11/2015  . Retinal detachment of left eye with retinal break 08/08/2011    Class: Acute    Past Surgical History:  Procedure Laterality Date  . CESAREAN SECTION N/A 04/10/2015   Procedure: CESAREAN SECTION;  Surgeon: Crawford Givens, MD;  Location: [facility];  Service: Obstetrics;  Laterality: N/A;  . EYE SURGERY     laser  . SCLERAL BUCKLE  08/09/2011   Procedure: SCLERAL BUCKLE;  Surgeon: Clent Demark Rankin;  Location: [facility];  Service: Ophthalmology;  Laterality: Left;  with cryo  . TENDON REPAIR       OB History    Gravida  2   Para  2   Term      Preterm  2   AB      Living  2     SAB      TAB      Ectopic        Multiple  0   Live Births  2            Home Medications    Prior to Admission medications   Medication Sig Start Date End Date Taking? Authorizing Provider  atenolol (TENORMIN) 25 MG tablet Take 12.5 mg by mouth 2 (two) times daily.    [provider]  diphenhydrAMINE (BENADRYL) 25 MG tablet Take 1 tablet (25 mg total) by mouth every 6 (six) hours as needed for itching (Rash). 06/23/17   Muthersbaugh, Jarrett Soho, PA-C  EPINEPHrine (EPIPEN 2-PAK) 0.3 mg/0.3 mL IJ SOAJ injection Inject 0.3 mLs (0.3 mg total) into the muscle once as needed (for severe allergic reaction). CAll 911 immediately if you have to use this medicine 06/23/17   Muthersbaugh, Jarrett Soho, PA-C  famotidine (PEPCID) 20 MG tablet Take 1 tablet (20 mg total) by mouth 2 (two) times daily. Patient not taking: Reported on 08/30/2017 06/23/17   Muthersbaugh, Jarrett Soho, PA-C  gabapentin (NEURONTIN) 100 MG capsule Take 100 mg by mouth daily as needed (for anxiety).  05/25/17 06/24/17  [provider]  lithium carbonate 300  MG capsule Take 300 mg by mouth 3 (three) times daily with meals.    [provider]  medroxyPROGESTERone (DEPO-PROVERA) 150 MG/ML injection Inject 150 mg into the muscle See admin instructions. IM every 3 months. Last injection dose May 14, 2017.    [provider]  norethindrone (MICRONOR,CAMILA,ERRIN) 0.35 MG tablet Take 1 tablet (0.35 mg total) by mouth daily. 09/06/17   Roma Schanz, CNM  predniSONE (DELTASONE) 20 MG tablet Take 2 tablets (40 mg total) by mouth daily. Patient not taking: Reported on 08/30/2017 06/23/17   Muthersbaugh, Jarrett Soho, PA-C  PROAIR HFA 108 (90 Base) MCG/ACT inhaler INHALE 1 TO 2 PUFFS PO Q 4 TO 6 H PRN FOR SOB AND WHEEZING 11/10/16   [provider]  QUEtiapine (SEROQUEL) 25 MG tablet Take 25 mg by mouth at bedtime.    [provider]    Family History Family History  Problem Relation Age of Onset  . Depression Father   .  Depression Sister   . Mental illness Sister   . Depression Maternal Grandmother   . Mental illness Maternal Grandmother   . Thyroid disease Maternal Grandmother   . Heart disease Maternal Grandfather   . Hypertension Maternal Grandfather   . Hyperlipidemia Maternal Grandfather   . CAD Maternal Grandfather   . Thyroid disease Other   . CAD Other     Social History Social History   Tobacco Use  . Smoking status: Never Smoker  . Smokeless tobacco: Never Used  Substance Use Topics  . Alcohol use: No  . Drug use: No     Allergies   Shrimp [shellfish allergy] and Sulfa antibiotics   Review of Systems Review of Systems  Constitutional: Negative for chills and fever.  Musculoskeletal: Positive for arthralgias (left foot pain). Negative for joint swelling.  Skin: Negative for color change and wound.  Neurological: Negative for weakness and numbness.  All other systems reviewed and are negative.    Physical Exam Updated Vital Signs BP (!) 145/98 (BP Location: Right Arm)   Pulse 78   Temp 98.1 F (36.7 C) (Oral)   Resp 16   LMP 02/16/2018   SpO2 100%   Physical Exam  Constitutional: She appears well-developed. No distress.  HENT:  Head: Atraumatic.  Cardiovascular: Normal rate, regular rhythm and intact distal pulses.  Pulmonary/Chest: Effort normal and breath sounds normal.  Musculoskeletal: She exhibits tenderness. She exhibits no edema or deformity.  Focal ttp of the dorsal left foot.  No edema, erythema or ecchymosis.  No edema, erythema of lower leg.  Compartments are soft.  Homan's sign negative  Neurological: She is alert. No sensory deficit.  Skin: Skin is warm. Capillary refill takes less than 2 seconds. No erythema.  Nursing note and vitals reviewed.    ED Treatments / Results  Labs (all labs ordered are listed, but only abnormal results are displayed) Labs Reviewed - No data to display  EKG None  Radiology Dg Foot Complete Left  Result Date:  02/20/2018 CLINICAL DATA:  Left foot pain after injury. EXAM: LEFT FOOT - COMPLETE 3+ VIEW COMPARISON:  Left foot x-rays dated April 01, 2012. FINDINGS: There is no evidence of fracture or dislocation. There is no evidence of arthropathy or other focal bone abnormality. Soft tissues are unremarkable. IMPRESSION: Negative. Electronically Signed   By: Titus Dubin M.D.   On: 02/20/2018 17:29    Procedures Procedures (including critical care time)  Medications Ordered in ED Medications - No data to display  Initial Impression / Assessment and Plan / ED Course  I have reviewed the triage vital signs and the nursing notes.  Pertinent labs & imaging results that were available during my care of the patient were reviewed by me and considered in my medical decision making (see chart for details).     XR neg for fx. NV intact.  Likely contusion. Lower leg pain felt to be secondary to compensated gait.  No clinical findings to suggest DVT,.  Pt agrees to RICE, continue NSAID.  Given referral info for podiatry in one week if not improving.    Final Clinical Impressions(s) / ED Diagnoses   Final diagnoses:  Contusion of left foot, initial encounter    ED Discharge Orders    None       Bufford Lope 02/20/18 1813    Dorie Rank, MD 02/20/18 [phone]

## 2018-02-20 NOTE — ED Notes (Signed)
Ace wrap and post -op shoe applied, ice pack given. Patient given discharge instruction, verbalized understand. Patient ambulatory out of the department.

## 2018-03-10 ENCOUNTER — Other Ambulatory Visit: Payer: Self-pay

## 2018-03-10 ENCOUNTER — Emergency Department (HOSPITAL_COMMUNITY): Payer: Medicaid Other

## 2018-03-10 ENCOUNTER — Encounter (HOSPITAL_COMMUNITY): Payer: Self-pay | Admitting: Emergency Medicine

## 2018-03-10 ENCOUNTER — Emergency Department (HOSPITAL_COMMUNITY)
Admission: EM | Admit: 2018-03-10 | Discharge: 2018-03-10 | Disposition: A | Discharge status: Home / Self Care | Payer: Medicaid Other | Attending: Emergency Medicine | Admitting: Emergency Medicine

## 2018-03-10 DIAGNOSIS — F419 Anxiety disorder, unspecified: Secondary | ICD-10-CM | POA: Diagnosis not present

## 2018-03-10 DIAGNOSIS — R0789 Other chest pain: Secondary | ICD-10-CM | POA: Diagnosis present

## 2018-03-10 DIAGNOSIS — Z79899 Other long term (current) drug therapy: Secondary | ICD-10-CM | POA: Insufficient documentation

## 2018-03-10 DIAGNOSIS — I10 Essential (primary) hypertension: Secondary | ICD-10-CM | POA: Diagnosis not present

## 2018-03-10 DIAGNOSIS — J45909 Unspecified asthma, uncomplicated: Secondary | ICD-10-CM | POA: Insufficient documentation

## 2018-03-10 DIAGNOSIS — Z7982 Long term (current) use of aspirin: Secondary | ICD-10-CM | POA: Insufficient documentation

## 2018-03-10 DIAGNOSIS — F329 Major depressive disorder, single episode, unspecified: Secondary | ICD-10-CM | POA: Insufficient documentation

## 2018-03-10 LAB — URINALYSIS, ROUTINE W REFLEX MICROSCOPIC
Bacteria, UA: NONE SEEN
Bilirubin Urine: NEGATIVE
Glucose, UA: NEGATIVE mg/dL
Ketones, ur: NEGATIVE mg/dL
Leukocytes, UA: NEGATIVE
Nitrite: NEGATIVE
Protein, ur: NEGATIVE mg/dL
RBC / HPF: >50 RBC/hpf — ABNORMAL HIGH (ref 0–5)
Specific Gravity, Urine: 1.009 (ref 1.005–1.030)
pH: 6 (ref 5.0–8.0)

## 2018-03-10 LAB — BASIC METABOLIC PANEL
Anion gap: 7 (ref 5–15)
BUN: 13 mg/dL (ref 6–20)
CO2: 26 mmol/L (ref 22–32)
Calcium: 9.4 mg/dL (ref 8.9–10.3)
Chloride: 106 mmol/L (ref 98–111)
Creatinine, Ser: 0.65 mg/dL (ref 0.44–1.00)
GFR calc Af Amer: >60 mL/min (ref >60)
GFR calc non Af Amer: >60 mL/min (ref >60)
Glucose, Bld: 91 mg/dL (ref 70–99)
Potassium: 4.3 mmol/L (ref 3.5–5.1)
Sodium: 139 mmol/L (ref 135–145)

## 2018-03-10 LAB — HEPATIC FUNCTION PANEL
ALT: 14 U/L (ref 0–44)
AST: 15 U/L (ref 15–41)
Albumin: 4.4 g/dL (ref 3.5–5.0)
Alkaline Phosphatase: 68 U/L (ref 38–126)
Bilirubin, Direct: <0.1 mg/dL (ref 0.0–0.2)
Total Bilirubin: 0.6 mg/dL (ref 0.3–1.2)
Total Protein: 8.4 g/dL — ABNORMAL HIGH (ref 6.5–8.1)

## 2018-03-10 LAB — HCG, QUANTITATIVE, PREGNANCY: hCG, Beta Chain, Quant, S: <1 m[IU]/mL (ref <5)

## 2018-03-10 LAB — CBC
HCT: 41.5 % (ref 36.0–46.0)
Hemoglobin: 13.4 g/dL (ref 12.0–15.0)
MCH: 27.8 pg (ref 26.0–34.0)
MCHC: 32.3 g/dL (ref 30.0–36.0)
MCV: 86.1 fL (ref 78.0–100.0)
Platelets: 233 10*3/uL (ref 150–400)
RBC: 4.82 MIL/uL (ref 3.87–5.11)
RDW: 13.5 % (ref 11.5–15.5)
WBC: 7 10*3/uL (ref 4.0–10.5)

## 2018-03-10 LAB — LIPASE, BLOOD: Lipase: 21 U/L (ref 11–51)

## 2018-03-10 LAB — TROPONIN I: Troponin I: <0.03 ng/mL (ref <0.03)

## 2018-03-10 LAB — D-DIMER, QUANTITATIVE: D-Dimer, Quant: 0.3 ug/mL-FEU (ref 0.00–0.50)

## 2018-03-10 MED ORDER — HYDROCODONE-ACETAMINOPHEN 5-325 MG PO TABS: ORAL_TABLET | ORAL | 0 refills | Status: DC

## 2018-03-10 MED ORDER — METHOCARBAMOL 500 MG PO TABS: 1000.0000 mg | ORAL_TABLET | Freq: Four times a day (QID) | ORAL | 0 refills | Status: DC | PRN

## 2018-03-10 NOTE — ED Triage Notes (Signed)
Patient c/o right side chest pain that radiates into back x2 days. Per patient shortness of breath with exertion. Patient also reports nausea and generalized weakness in extremities bilaterally. Per patient hx of POTS in which she has noted having to take her atenolol sooner then normal due to her heart rate.

## 2018-03-10 NOTE — Discharge Instructions (Signed)
Take the prescriptions as directed.  Apply moist heat or ice to the area(s) of discomfort, for 15 minutes at a time, several times per day for the next few days.  Do not fall asleep on a heating or ice pack.  Call your regular medical doctor on Monday to schedule a follow up appointment this week.  Return to the Emergency Department immediately if worsening. ° °

## 2018-03-10 NOTE — ED Notes (Signed)
Pt reports 3 day hx of R upper abd pain with radiation to the back She reports "heartburn" that TUMs does not help  She has 2 children less than 26 yo and denies any stone hx during pregnancy

## 2018-03-10 NOTE — ED Provider Notes (Signed)
[facility] EMERGENCY DEPARTMENT Provider Note   CSN: [id number] Arrival date & time: 03/10/18  1658     History   Chief Complaint Chief Complaint  Patient presents with  . Abdominal Pain    HPI Kathy Zimmerman is a 27 y.o. female.  HPI  Pt was seen at [facility]. Per pt, c/o gradual onset and persistence of constant right sided chest wall "pain" for the past 3 days. States the pain waxing and wanes, but never completely goes away. Pain is located in her anterior right chest and follows her bra line around to the side of her lateral ribs. Has been associated with "some" SOB.  Describes the pain as "heartburn." Denies injury, no rash, no fevers, no calf/LE pain or unilateral swelling, no palpitations, no cough, no abd pain, no N/V/D, no dysuria/hematuria.    Past Medical History:  Diagnosis Date  . Anxiety   . Asthma   . Depression   . Headache   . IBS (irritable bowel syndrome)   . Panic attack   . Preeclampsia     Patient Active Problem List   Diagnosis Date Noted  . Hypertension 05/30/2017  . POTS (postural orthostatic tachycardia syndrome) 05/29/2017  . History of 2 cesarean sections 03/20/2016  . Hx of severe preeclampsia 03/20/2016  . Atypical chest pain 05/31/2015  . Anxiety 04/11/2015  . Retinal detachment of left eye with retinal break 08/08/2011    Class: Acute    Past Surgical History:  Procedure Laterality Date  . CESAREAN SECTION N/A 04/10/2015   Procedure: CESAREAN SECTION;  Surgeon: Crawford Givens, MD;  Location: [facility];  Service: Obstetrics;  Laterality: N/A;  . EYE SURGERY     laser  . SCLERAL BUCKLE  08/09/2011   Procedure: SCLERAL BUCKLE;  Surgeon: Clent Demark Rankin;  Location: [facility];  Service: Ophthalmology;  Laterality: Left;  with cryo  . TENDON REPAIR       OB History    Gravida  2   Para  2   Term      Preterm  2   AB      Living  2     SAB      TAB      Ectopic      Multiple  0   Live Births  2            Home  Medications    Prior to Admission medications   Medication Sig Start Date End Date Taking? Authorizing Provider  aspirin EC 81 MG tablet Take 81 mg by mouth as needed for mild pain.   Yes [provider]  atenolol (TENORMIN) 25 MG tablet Take 12.5 mg by mouth 2 (two) times daily.   Yes [provider]  clonazePAM (KLONOPIN) 0.5 MG tablet Take 1 tablet by mouth daily. 10/25/17  Yes [provider]  ibuprofen (ADVIL,MOTRIN) 200 MG tablet Take 400 mg by mouth every 6 (six) hours as needed for headache or mild pain.   Yes [provider]  diphenhydrAMINE (BENADRYL) 25 MG tablet Take 1 tablet (25 mg total) by mouth every 6 (six) hours as needed for itching (Rash). 06/23/17   Muthersbaugh, Jarrett Soho, PA-C  EPINEPHrine (EPIPEN 2-PAK) 0.3 mg/0.3 mL IJ SOAJ injection Inject 0.3 mLs (0.3 mg total) into the muscle once as needed (for severe allergic reaction). CAll 911 immediately if you have to use this medicine 06/23/17   Muthersbaugh, Jarrett Soho, PA-C  famotidine (PEPCID) 20 MG tablet Take 1 tablet (20  mg total) by mouth 2 (two) times daily. Patient not taking: Reported on 08/30/2017 06/23/17   Muthersbaugh, Jarrett Soho, PA-C  gabapentin (NEURONTIN) 100 MG capsule Take 100 mg by mouth daily as needed (for anxiety).  05/25/17 06/24/17  [provider]  lithium carbonate 300 MG capsule Take 300 mg by mouth 3 (three) times daily with meals.    [provider]  PROAIR HFA 108 (90 Base) MCG/ACT inhaler INHALE 1 TO 2 PUFFS PO Q 4 TO 6 H PRN FOR SOB AND WHEEZING 11/10/16   [provider]    Family History Family History  Problem Relation Age of Onset  . Depression Father   . Depression Sister   . Mental illness Sister   . Depression Maternal Grandmother   . Mental illness Maternal Grandmother   . Thyroid disease Maternal Grandmother   . Heart disease Maternal Grandfather   . Hypertension Maternal Grandfather   . Hyperlipidemia Maternal Grandfather   . CAD  Maternal Grandfather   . Thyroid disease Other   . CAD Other     Social History Social History   Tobacco Use  . Smoking status: Never Smoker  . Smokeless tobacco: Never Used  Substance Use Topics  . Alcohol use: No  . Drug use: No     Allergies   Shrimp [shellfish allergy] and Sulfa antibiotics   Review of Systems Review of Systems ROS: Statement: All systems negative except as marked or noted in the HPI; Constitutional: Negative for fever and chills. ; ; Eyes: Negative for eye pain, redness and discharge. ; ; ENMT: Negative for ear pain, hoarseness, nasal congestion, sinus pressure and sore throat. ; ; Cardiovascular: Negative for palpitations, diaphoresis, and peripheral edema. ; ; Respiratory: +SOB. Negative for cough, wheezing and stridor. ; ; Gastrointestinal: Negative for nausea, vomiting, diarrhea, abdominal pain, blood in stool, hematemesis, jaundice and rectal bleeding. . ; ; Genitourinary: Negative for dysuria, flank pain and hematuria. ; ; Musculoskeletal: +right chest wall pain. Negative for back pain and neck pain. Negative for swelling and trauma.; ; Skin: Negative for pruritus, rash, abrasions, blisters, bruising and skin lesion.; ; Neuro: Negative for headache, lightheadedness and neck stiffness. Negative for weakness, altered level of consciousness, altered mental status, extremity weakness, paresthesias, involuntary movement, seizure and syncope.       Physical Exam Updated Vital Signs BP 123/84   Pulse 79   Temp 98.4 F (36.9 C) (Oral)   Resp 10   Ht 5\' 6"  (1.676 m)   Wt 72.6 kg (160 lb)   LMP 03/10/2018   SpO2 100%   BMI 25.82 kg/m    Patient Vitals for the past 24 hrs:  BP Temp Temp src Pulse Resp SpO2 Height Weight  03/10/18 2045 - - - 79 10 100 % - -  03/10/18 1945 - - - 80 19 100 % - -  03/10/18 1930 123/84 - - 74 - 100 % - -  03/10/18 1900 122/85 - - 76 - 100 % - -  03/10/18 1830 123/76 - - 63 - 100 % - -  03/10/18 1800 121/82 - - 78 - 100 % -  -  03/10/18 1705 (!) 126/100 98.4 F (36.9 C) Oral 87 18 97 % 5\' 6"  (1.676 m) 72.6 kg (160 lb)     Physical Exam 1925: Physical examination:  Nursing notes reviewed; Vital signs and O2 SAT reviewed;  Constitutional: Well developed, Well nourished, Well hydrated, In no acute distress; Head:  Normocephalic, atraumatic; Eyes: EOMI, PERRL,  No scleral icterus; ENMT: Mouth and pharynx normal, Mucous membranes moist; Neck: Supple, Full range of motion, No lymphadenopathy; Cardiovascular: Regular rate and rhythm, No gallop; Respiratory: Breath sounds clear & equal bilaterally, No wheezes.  Speaking full sentences with ease, Normal respiratory effort/excursion; Chest: Nontender, no deformity, no rash. Movement normal; Abdomen: Soft, Nontender, Nondistended, Normal bowel sounds; Genitourinary: No CVA tenderness; Extremities: Peripheral pulses normal, No tenderness, No edema, No calf edema or asymmetry.; Neuro: AA&Ox3, Major CN grossly intact.  Speech clear. No gross focal motor or sensory deficits in extremities.; Skin: Color normal, Warm, Dry.    ED Treatments / Results  Labs (all labs ordered are listed, but only abnormal results are displayed)   EKG EKG Interpretation  Date/Time:  Sunday March 10 2018 17:03:58 EDT Ventricular Rate:  87 PR Interval:  140 QRS Duration: 80 QT Interval:  354 QTC Calculation: 425 R Axis:   30 Text Interpretation:  Normal sinus rhythm Normal ECG When compared with ECG of 12/07/2017 No significant change was found Confirmed by Francine Graven [phone]) on 03/10/2018 8:51:29 PM   Radiology   Procedures Procedures (including critical care time)  Medications Ordered in ED Medications - No data to display   Initial Impression / Assessment and Plan / ED Course  I have reviewed the triage vital signs and the nursing notes.  Pertinent labs & imaging results that were available during my care of the patient were reviewed by me and considered in my medical decision  making (see chart for details).  MDM Reviewed: previous chart, nursing note and vitals Reviewed previous: labs and ECG Interpretation: labs, ECG and x-ray   Results for orders placed or performed during the hospital encounter of 48/54/62  Basic metabolic panel  Result Value Ref Range   Sodium 139 135 - 145 mmol/L   Potassium 4.3 3.5 - 5.1 mmol/L   Chloride 106 98 - 111 mmol/L   CO2 26 22 - 32 mmol/L   Glucose, Bld 91 70 - 99 mg/dL   BUN 13 6 - 20 mg/dL   Creatinine, Ser 0.65 0.44 - 1.00 mg/dL   Calcium 9.4 8.9 - 10.3 mg/dL   GFR calc non Af Amer >60 >60 mL/min   GFR calc Af Amer >60 >60 mL/min   Anion gap 7 5 - 15  CBC  Result Value Ref Range   WBC 7.0 4.0 - 10.5 K/uL   RBC 4.82 3.87 - 5.11 MIL/uL   Hemoglobin 13.4 12.0 - 15.0 g/dL   HCT 41.5 36.0 - 46.0 %   MCV 86.1 78.0 - 100.0 fL   MCH 27.8 26.0 - 34.0 pg   MCHC 32.3 30.0 - 36.0 g/dL   RDW 13.5 11.5 - 15.5 %   Platelets 233 150 - 400 K/uL  Troponin I  Result Value Ref Range   Troponin I <0.03 <0.03 ng/mL  hCG, quantitative, pregnancy  Result Value Ref Range   hCG, Beta Chain, Quant, S <1 <5 mIU/mL  Hepatic function panel  Result Value Ref Range   Total Protein 8.4 (H) 6.5 - 8.1 g/dL   Albumin 4.4 3.5 - 5.0 g/dL   AST 15 15 - 41 U/L   ALT 14 0 - 44 U/L   Alkaline Phosphatase 68 38 - 126 U/L   Total Bilirubin 0.6 0.3 - 1.2 mg/dL   Bilirubin, Direct <0.1 0.0 - 0.2 mg/dL   Indirect Bilirubin NOT CALCULATED 0.3 - 0.9 mg/dL  Lipase, blood  Result Value Ref Range   Lipase 21  11 - 51 U/L  D-dimer, quantitative  Result Value Ref Range   D-Dimer, Quant 0.30 0.00 - 0.50 ug/mL-FEU  Urinalysis, Routine w reflex microscopic  Result Value Ref Range   Color, Urine STRAW (A) YELLOW   APPearance CLEAR CLEAR   Specific Gravity, Urine 1.009 1.005 - 1.030   pH 6.0 5.0 - 8.0   Glucose, UA NEGATIVE NEGATIVE mg/dL   Hgb urine dipstick LARGE (A) NEGATIVE   Bilirubin Urine NEGATIVE NEGATIVE   Ketones, ur NEGATIVE NEGATIVE  mg/dL   Protein, ur NEGATIVE NEGATIVE mg/dL   Nitrite NEGATIVE NEGATIVE   Leukocytes, UA NEGATIVE NEGATIVE   RBC / HPF >50 (H) 0 - 5 RBC/hpf   WBC, UA 0-5 0 - 5 WBC/hpf   Bacteria, UA NONE SEEN NONE SEEN   Squamous Epithelial / LPF 6-10 0 - 5   Dg Chest 2 View Result Date: 03/10/2018 CLINICAL DATA:  Chest pain and cough EXAM: CHEST - 2 VIEW COMPARISON:  12/07/2017 FINDINGS: The heart size and mediastinal contours are within normal limits. Both lungs are clear. The visualized skeletal structures are unremarkable. IMPRESSION: No active cardiopulmonary disease. Electronically Signed   By: Inez Catalina M.D.   On: 03/10/2018 18:06    2100:  Currently has menses. Doubt PE as cause for symptoms with normal d-dimer, PERC negative and low risk [place].  Doubt ACS as cause for symptoms with normal troponin and unchanged EKG from previous after 3 days of constant atypical symptoms.  Pt states she would like to go home now.  Tx symptomatically at this time. Dx and testing d/w pt.  Questions answered.  Verb understanding, agreeable to d/c home with outpt f/u.     Final Clinical Impressions(s) / ED Diagnoses   Final diagnoses:  None    ED Discharge Orders    None       Francine Graven, DO 03/14/18 1322

## 2018-04-14 ENCOUNTER — Encounter (HOSPITAL_COMMUNITY): Payer: Self-pay | Admitting: Emergency Medicine

## 2018-04-14 ENCOUNTER — Emergency Department (HOSPITAL_COMMUNITY)
Admission: EM | Admit: 2018-04-14 | Discharge: 2018-04-15 | Disposition: A | Discharge status: Home / Self Care | Payer: PRIVATE HEALTH INSURANCE | Attending: Emergency Medicine | Admitting: Emergency Medicine

## 2018-04-14 ENCOUNTER — Other Ambulatory Visit: Payer: Self-pay

## 2018-04-14 DIAGNOSIS — I10 Essential (primary) hypertension: Secondary | ICD-10-CM | POA: Insufficient documentation

## 2018-04-14 DIAGNOSIS — J45909 Unspecified asthma, uncomplicated: Secondary | ICD-10-CM | POA: Insufficient documentation

## 2018-04-14 DIAGNOSIS — T7840XA Allergy, unspecified, initial encounter: Secondary | ICD-10-CM | POA: Insufficient documentation

## 2018-04-14 DIAGNOSIS — Z7982 Long term (current) use of aspirin: Secondary | ICD-10-CM | POA: Diagnosis not present

## 2018-04-14 DIAGNOSIS — Z79899 Other long term (current) drug therapy: Secondary | ICD-10-CM | POA: Diagnosis not present

## 2018-04-14 DIAGNOSIS — L298 Other pruritus: Secondary | ICD-10-CM | POA: Diagnosis not present

## 2018-04-14 DIAGNOSIS — R21 Rash and other nonspecific skin eruption: Secondary | ICD-10-CM | POA: Diagnosis present

## 2018-04-14 MED ORDER — DOXEPIN HCL 10 MG PO CAPS
10.0000 mg | ORAL_CAPSULE | Freq: Once | ORAL | Status: AC
  Administered 2018-04-14: 10 mg via ORAL
  Filled 2018-04-14 (×2): qty 1

## 2018-04-14 MED ORDER — PREDNISONE 50 MG PO TABS
60.0000 mg | ORAL_TABLET | Freq: Once | ORAL | Status: AC
  Administered 2018-04-14: 60 mg via ORAL
  Filled 2018-04-14: qty 1

## 2018-04-14 MED ORDER — FAMOTIDINE 20 MG PO TABS
20.0000 mg | ORAL_TABLET | Freq: Once | ORAL | Status: AC
Start: 2018-04-14 — End: 2018-04-14
  Administered 2018-04-14: 20 mg via ORAL
  Filled 2018-04-14: qty 1

## 2018-04-14 MED ORDER — DIPHENHYDRAMINE HCL 25 MG PO CAPS
25.0000 mg | ORAL_CAPSULE | Freq: Once | ORAL | Status: AC
  Administered 2018-04-14: 25 mg via ORAL
  Filled 2018-04-14: qty 1

## 2018-04-14 NOTE — ED Triage Notes (Signed)
Pt c/o of an allergic reaction, thinks may be allergy to cats/dogs that she was around last night and tonight, pt states "this morning I felt like my face was swollen and now I feel like my throat is closing up" pt on phone and talking without difficulty during triage, pt states she has an epi pen for a shrimp allergy

## 2018-04-14 NOTE — ED Provider Notes (Signed)
[facility] EMERGENCY DEPARTMENT Provider Note   CSN: [id number] Arrival date & time: 04/14/18  2205     History   Chief Complaint Chief Complaint  Patient presents with  . Allergic Reaction    HPI Kathy Zimmerman is a 27 y.o. female.  The history is provided by the patient.  She has history of asthma, depression, hypertension, allergies and comes in with which he feels is an allergic reaction which started this morning.  She has noted some itching and a rash on her face.  This evening, she started to have some difficulty swallowing and her throat feels itchy.  She denies any difficulty breathing.  She thinks it may be from exposure to animals.  She has had similar reactions in the past.  She does have an EpiPen at home, but has not used it.  She has taken 4 doses of diphenhydramine without any benefit.  Last dose of diphenhydramine was 1 hour ago.  Past Medical History:  Diagnosis Date  . Anxiety   . Asthma   . Depression   . Headache   . IBS (irritable bowel syndrome)   . Panic attack   . Preeclampsia     Patient Active Problem List   Diagnosis Date Noted  . Hypertension 05/30/2017  . POTS (postural orthostatic tachycardia syndrome) 05/29/2017  . History of 2 cesarean sections 03/20/2016  . Hx of severe preeclampsia 03/20/2016  . Atypical chest pain 05/31/2015  . Anxiety 04/11/2015  . Retinal detachment of left eye with retinal break 08/08/2011    Class: Acute    Past Surgical History:  Procedure Laterality Date  . CESAREAN SECTION N/A 04/10/2015   Procedure: CESAREAN SECTION;  Surgeon: Crawford Givens, MD;  Location: [facility];  Service: Obstetrics;  Laterality: N/A;  . EYE SURGERY     laser  . SCLERAL BUCKLE  08/09/2011   Procedure: SCLERAL BUCKLE;  Surgeon: Clent Demark Rankin;  Location: [facility];  Service: Ophthalmology;  Laterality: Left;  with cryo  . TENDON REPAIR       OB History    Gravida  2   Para  2   Term      Preterm  2   AB      Living  2     SAB       TAB      Ectopic      Multiple  0   Live Births  2            Home Medications    Prior to Admission medications   Medication Sig Start Date End Date Taking? Authorizing Provider  aspirin EC 81 MG tablet Take 81 mg by mouth as needed for mild pain.    [provider]  atenolol (TENORMIN) 25 MG tablet Take 12.5 mg by mouth 2 (two) times daily.    [provider]  clonazePAM (KLONOPIN) 0.5 MG tablet Take 1 tablet by mouth daily. 10/25/17   [provider]  diphenhydrAMINE (BENADRYL) 25 MG tablet Take 1 tablet (25 mg total) by mouth every 6 (six) hours as needed for itching (Rash). 06/23/17   Muthersbaugh, Jarrett Soho, PA-C  EPINEPHrine (EPIPEN 2-PAK) 0.3 mg/0.3 mL IJ SOAJ injection Inject 0.3 mLs (0.3 mg total) into the muscle once as needed (for severe allergic reaction). CAll 911 immediately if you have to use this medicine 06/23/17   Muthersbaugh, Jarrett Soho, PA-C  famotidine (PEPCID) 20 MG tablet Take 1 tablet (20 mg total) by mouth 2 (two) times  daily. Patient not taking: Reported on 08/30/2017 06/23/17   Muthersbaugh, Jarrett Soho, PA-C  gabapentin (NEURONTIN) 100 MG capsule Take 100 mg by mouth daily as needed (for anxiety).  05/25/17 06/24/17  [provider]  HYDROcodone-acetaminophen (NORCO/VICODIN) 5-325 MG tablet 1 or 2 tabs PO q6 hours prn pain 03/10/18   Francine Graven, DO  ibuprofen (ADVIL,MOTRIN) 200 MG tablet Take 400 mg by mouth every 6 (six) hours as needed for headache or mild pain.    [provider]  lithium carbonate 300 MG capsule Take 300 mg by mouth 3 (three) times daily with meals.    [provider]  methocarbamol (ROBAXIN) 500 MG tablet Take 2 tablets (1,000 mg total) by mouth 4 (four) times daily as needed for muscle spasms (muscle spasm/pain). 03/10/18   Francine Graven, DO  PROAIR HFA [phone] Base) MCG/ACT inhaler INHALE 1 TO 2 PUFFS PO Q 4 TO 6 H PRN FOR SOB AND WHEEZING 11/10/16   [provider]     Family History Family History  Problem Relation Age of Onset  . Depression Father   . Depression Sister   . Mental illness Sister   . Depression Maternal Grandmother   . Mental illness Maternal Grandmother   . Thyroid disease Maternal Grandmother   . Heart disease Maternal Grandfather   . Hypertension Maternal Grandfather   . Hyperlipidemia Maternal Grandfather   . CAD Maternal Grandfather   . Thyroid disease Other   . CAD Other     Social History Social History   Tobacco Use  . Smoking status: Never Smoker  . Smokeless tobacco: Never Used  Substance Use Topics  . Alcohol use: No  . Drug use: No     Allergies   Shrimp [shellfish allergy] and Sulfa antibiotics   Review of Systems Review of Systems  All other systems reviewed and are negative.    Physical Exam Updated Vital Signs BP (!) 142/89 (BP Location: Right Arm)   Pulse 84   Temp 97.7 F (36.5 C) (Oral)   Ht 5\' 6"  (1.676 m)   Wt 70.8 kg (156 lb)   SpO2 100%   BMI 25.18 kg/m   Physical Exam  Nursing note and vitals reviewed.  27 year old female, resting comfortably and in no acute distress. Vital signs are significant for mildly elevated blood pressure. Oxygen saturation is 100%, which is normal. Head is normocephalic and atraumatic. PERRLA, EOMI. there is slight swelling of the sublingual tissue and of the uvula.  There is no pooling of secretions.  There is no stridor.  Phonation is normal. Neck is nontender and supple without adenopathy or JVD. Back is nontender and there is no CVA tenderness. Lungs are clear without rales, wheezes, or rhonchi. Chest is nontender. Heart has regular rate and rhythm without murmur. Abdomen is soft, flat, nontender without masses or hepatosplenomegaly and peristalsis is normoactive. Extremities have no cyanosis or edema, full range of motion is present. Skin is warm and dry.  Faint erythematous papular rash present over the malar areas and the  forehead. Neurologic: Mental status is normal, cranial nerves are intact, there are no motor or sensory deficits.  ED Treatments / Results   Procedures Procedures   Medications Ordered in ED Medications  diphenhydrAMINE (BENADRYL) capsule 25 mg (25 mg Oral Given 04/14/18 2307)  famotidine (PEPCID) tablet 20 mg (20 mg Oral Given 04/14/18 2307)  predniSONE (DELTASONE) tablet 60 mg (60 mg Oral Given 04/14/18 2307)  doxepin (SINEQUAN) capsule 10 mg (10  mg Oral Given 04/14/18 2351)     Initial Impression / Assessment and Plan / ED Course  I have reviewed the triage vital signs and the nursing notes.  Probable allergic reaction with mild edema in the sublingual tissue and uvula and facial rash.  No evidence of any respiratory distress, will hold off on epinephrine administration.  She will be given additional diphenhydramine as well as famotidine and prednisone.  We will also give a dose of doxepin.  Old records are reviewed, and she has several prior ED visits with similar presentations.    2:44 AM She is feeling much better.  On reexam, rash has went away and sublingual and pharyngeal swelling is gone.  She is discharged with prescriptions for doxepin and prednisone, follow-up with PCP.  Consider reevaluation by her allergist.  Final Clinical Impressions(s) / ED Diagnoses   Final diagnoses:  Allergic reaction, initial encounter    ED Discharge Orders        Ordered    doxepin (SINEQUAN) 10 MG capsule  3 times daily     04/15/18 0242    predniSONE (DELTASONE) 50 MG tablet  Daily     40/37/09 6438       Delora Fuel, MD 38/18/40 [phone]

## 2018-04-15 MED ORDER — PREDNISONE 50 MG PO TABS: 50.0000 mg | ORAL_TABLET | Freq: Every day | ORAL | 0 refills | Status: DC

## 2018-04-15 MED ORDER — DOXEPIN HCL 10 MG PO CAPS: 10.0000 mg | ORAL_CAPSULE | Freq: Three times a day (TID) | ORAL | 0 refills | Status: DC

## 2018-04-15 NOTE — Discharge Instructions (Addendum)
Consider going back to your allergist for additional testing.

## 2018-04-17 ENCOUNTER — Other Ambulatory Visit: Payer: Self-pay | Admitting: Cardiovascular Disease

## 2018-05-02 ENCOUNTER — Telehealth: Payer: Self-pay | Admitting: Obstetrics & Gynecology

## 2018-05-03 MED ORDER — MEDROXYPROGESTERONE ACETATE 150 MG/ML IM SUSP
150.0000 mg | INTRAMUSCULAR | 3 refills | Status: DC
Start: 2018-05-03 — End: 2018-11-29

## 2018-05-07 ENCOUNTER — Telehealth: Payer: Self-pay | Admitting: Student

## 2018-05-07 NOTE — Telephone Encounter (Signed)
Attempt to reach, [id number] 

## 2018-05-07 NOTE — Telephone Encounter (Signed)
Pt has POTS, sx's are over several weeks, next apt is 05/31/18, any advice

## 2018-05-07 NOTE — Telephone Encounter (Signed)
per pt phone call--- BP dropping, HR going up and having pain in chest for several weeks.   Scheduled to see B. Strader on 05/31/18

## 2018-05-07 NOTE — Telephone Encounter (Signed)
   I have never met this patient as she has been followed by Dr. Johnsie Cancel and Ermalinda Barrios, PA-C. Does she have any recent HR or BP recordings? At the time of her last visit with Selinda Eon, it was unclear if she was taking Atenolol 12.'5mg'$  once daily or twice daily. It would be helpful to know what her readings have been and how she is taking the medication. Please encourage her to keep a list of HR and BP readings if not already doing so. Would also review the importance of adequate hydration.   The first note from today mentioned chest pain but was not listed in the second one. Is she having chest pain or mostly palpitations with elevated HR? If having pain, how long is this lasting? Looks like she had a GXT and echo in 10/2016 which were normal at that time.  Signed, Erma Heritage, PA-C 05/07/2018, 10:32 AM Pager: [phone]

## 2018-05-09 ENCOUNTER — Other Ambulatory Visit: Payer: PRIVATE HEALTH INSURANCE

## 2018-05-09 ENCOUNTER — Ambulatory Visit (INDEPENDENT_AMBULATORY_CARE_PROVIDER_SITE_OTHER): Payer: PRIVATE HEALTH INSURANCE | Admitting: *Deleted

## 2018-05-09 DIAGNOSIS — Z3042 Encounter for surveillance of injectable contraceptive: Secondary | ICD-10-CM | POA: Diagnosis not present

## 2018-05-09 LAB — BETA HCG QUANT (REF LAB): hCG Quant: <1 m[IU]/mL

## 2018-05-09 MED ORDER — MEDROXYPROGESTERONE ACETATE 150 MG/ML IM SUSP
150.0000 mg | Freq: Once | INTRAMUSCULAR | Status: AC
  Administered 2018-05-09: 150 mg via INTRAMUSCULAR

## 2018-05-09 NOTE — Progress Notes (Signed)
Pt in for depo provera. HCG was negative this morning. No unprotected sex in the past two weeks. Her lmp was on 04/29/18. Depo Provera 150 mg given IM in right upper outer gluteal. Patient tolerated well. To return in 12 weeks.

## 2018-05-21 IMAGING — DX DG CHEST 2V
2 series · 2 of 2 positions shown · non-contrast
Comparison: May 27, 2015

CLINICAL DATA: Left chest pain.

EXAM:
CHEST  2 VIEW

[chest pa]
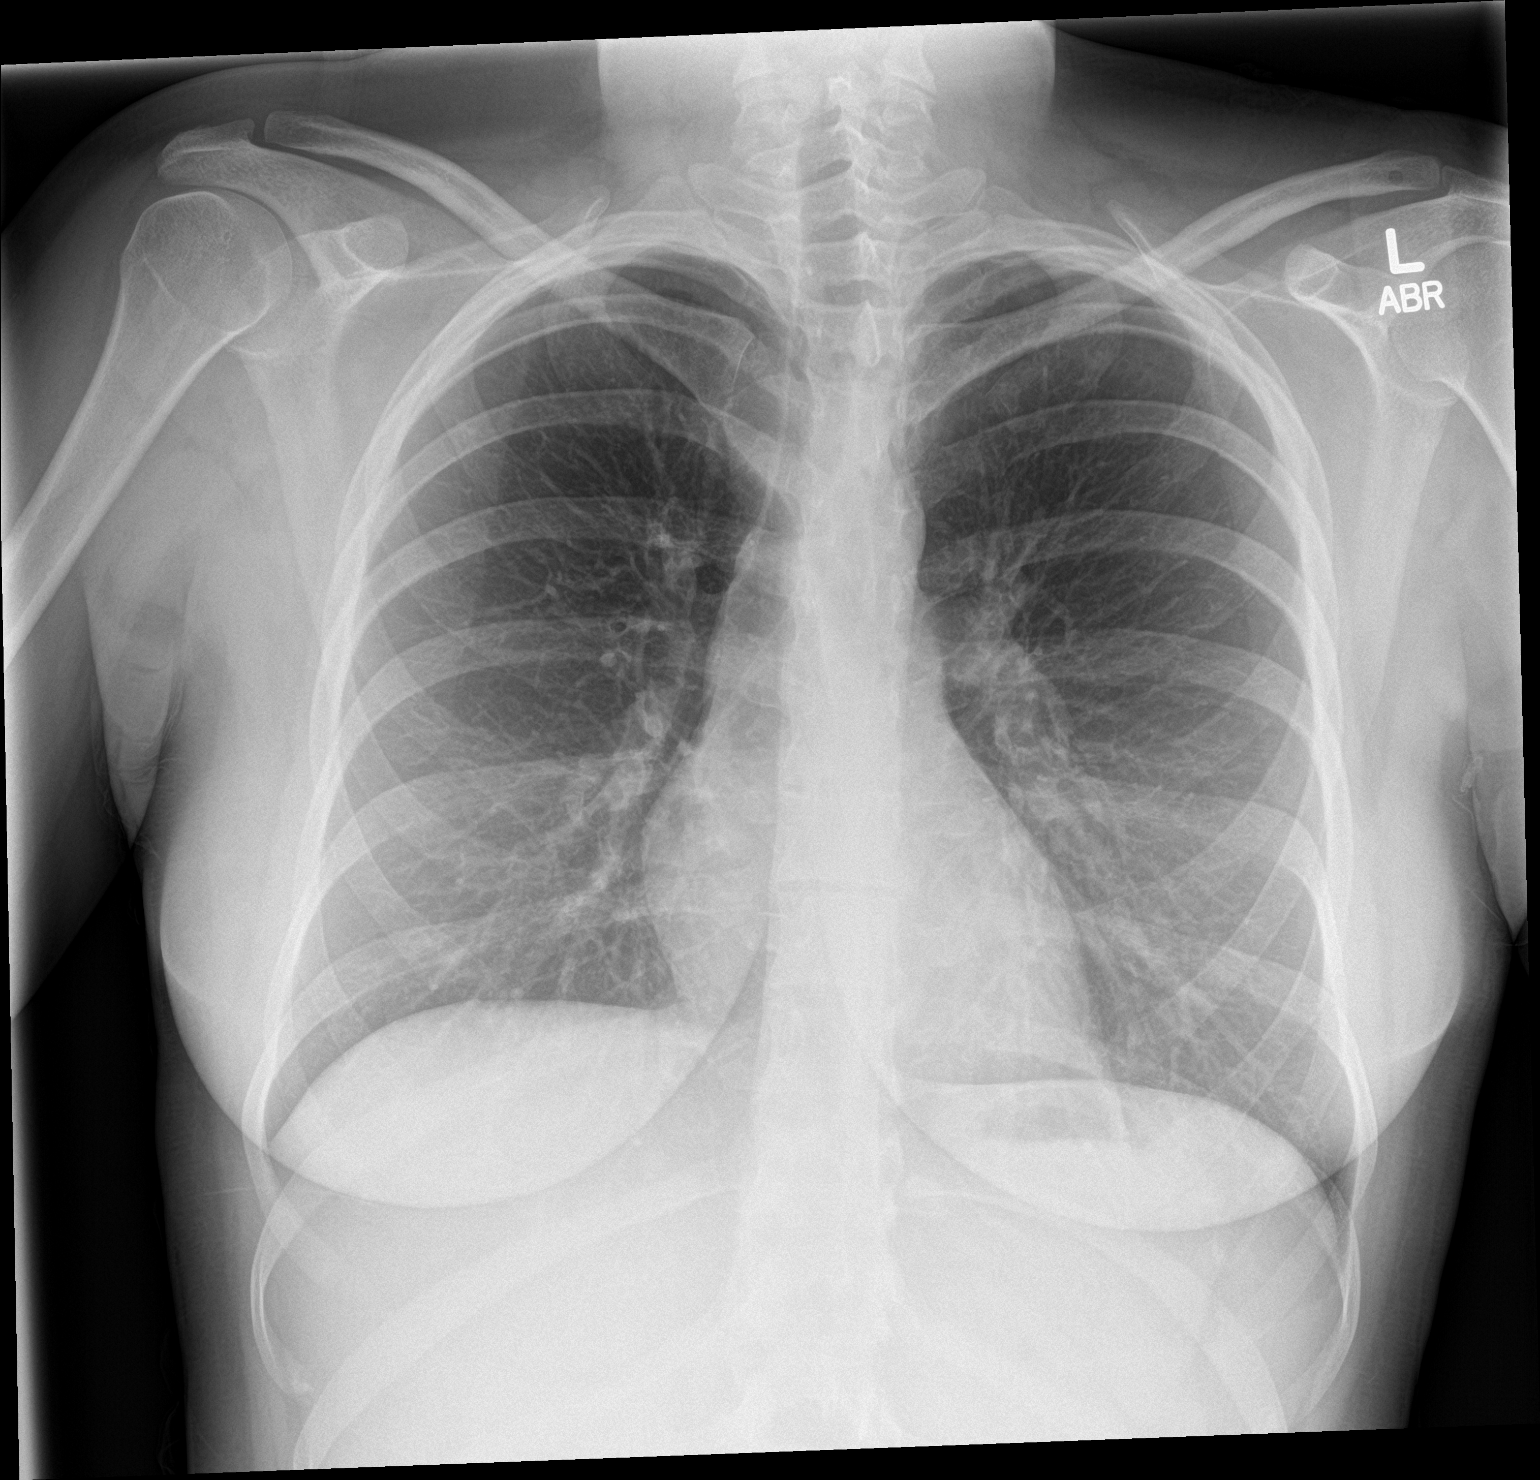

[chest lat]
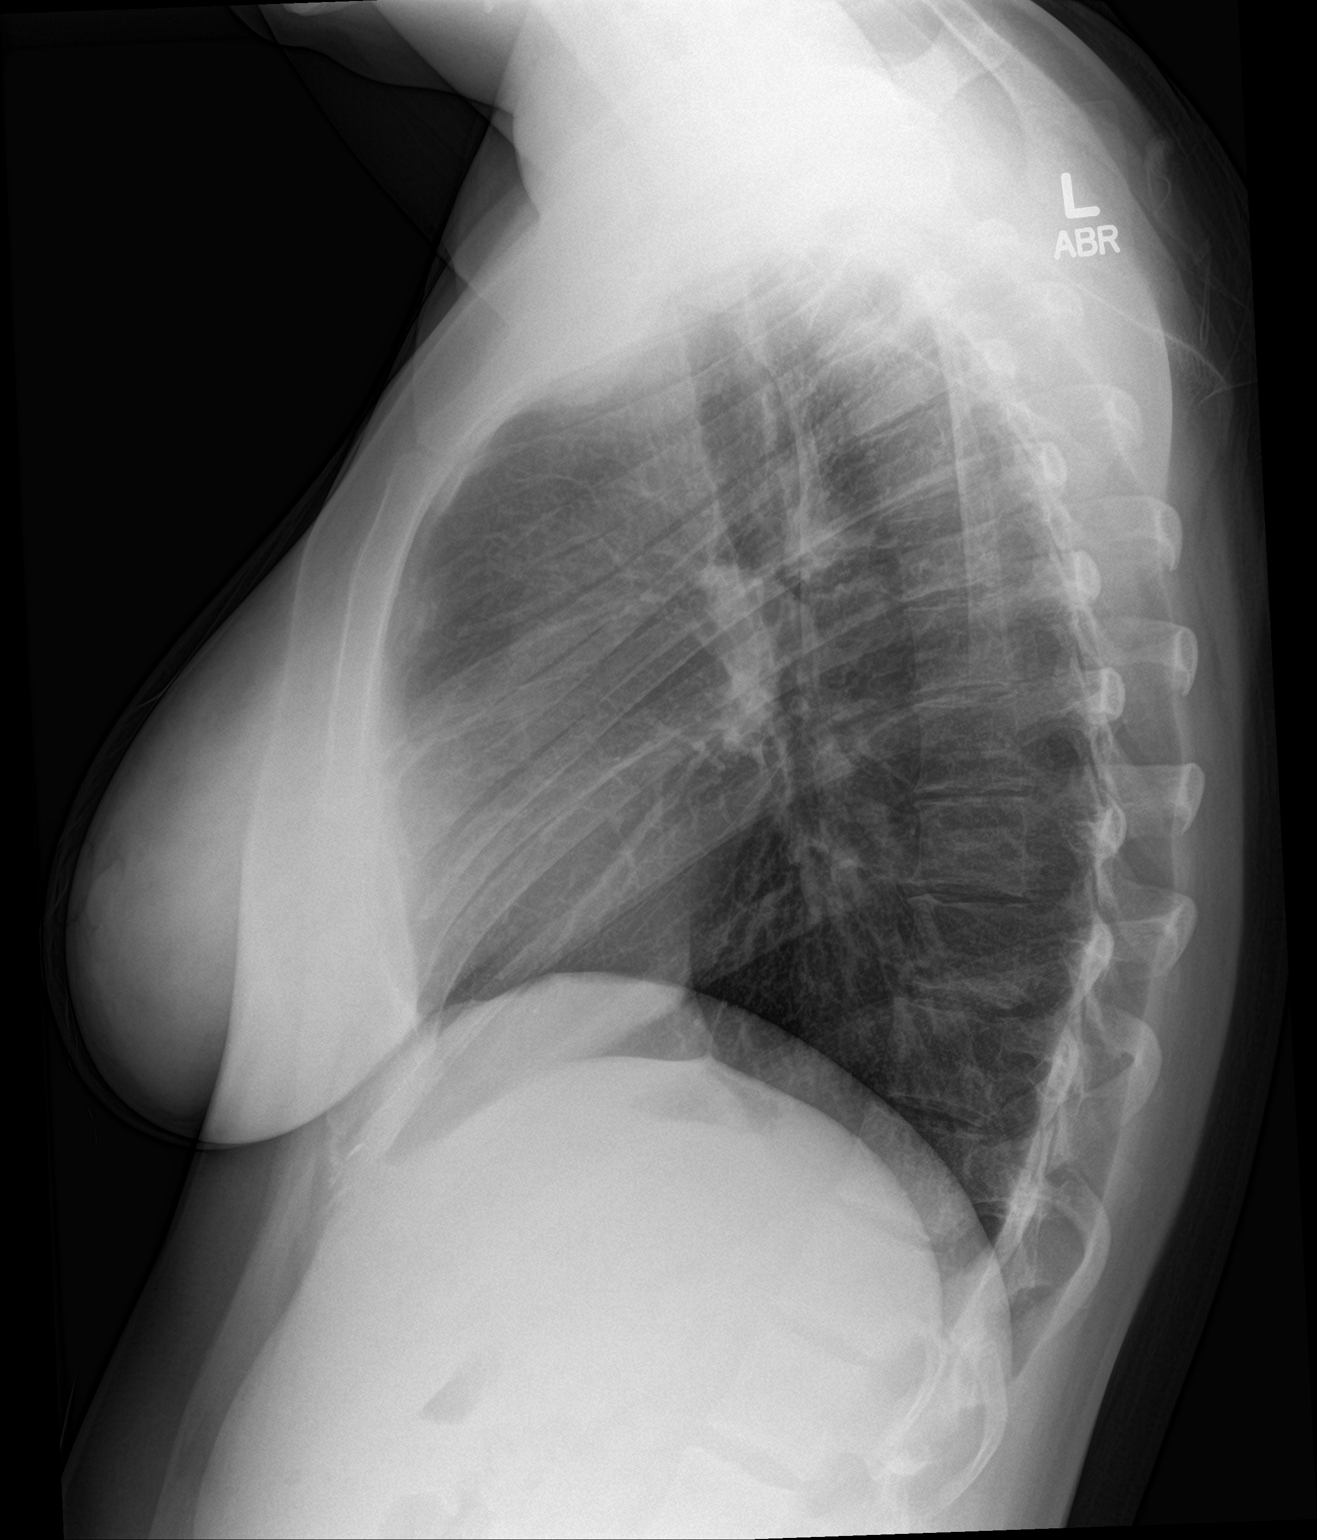

[2 of 2 positions shown; findings below may reference images not displayed]

FINDINGS: The heart size and mediastinal contours are within normal limits.
There is no focal infiltrate, pulmonary edema, or pleural effusion.
The visualized skeletal structures are unremarkable.
IMPRESSION: No active cardiopulmonary disease.

## 2018-05-22 NOTE — Telephone Encounter (Signed)
Attempt to reach, got vm, lmtcb--

## 2018-05-24 NOTE — Telephone Encounter (Signed)
Unable to reach, mailed letter

## 2018-05-27 ENCOUNTER — Other Ambulatory Visit: Payer: Self-pay

## 2018-05-27 ENCOUNTER — Encounter: Payer: Self-pay | Admitting: Sports Medicine

## 2018-05-27 ENCOUNTER — Ambulatory Visit (INDEPENDENT_AMBULATORY_CARE_PROVIDER_SITE_OTHER): Payer: PRIVATE HEALTH INSURANCE | Admitting: Sports Medicine

## 2018-05-27 ENCOUNTER — Ambulatory Visit (INDEPENDENT_AMBULATORY_CARE_PROVIDER_SITE_OTHER): Payer: PRIVATE HEALTH INSURANCE

## 2018-05-27 ENCOUNTER — Ambulatory Visit: Payer: PRIVATE HEALTH INSURANCE | Admitting: Podiatry

## 2018-05-27 DIAGNOSIS — M7742 Metatarsalgia, left foot: Secondary | ICD-10-CM | POA: Diagnosis not present

## 2018-05-27 DIAGNOSIS — M779 Enthesopathy, unspecified: Secondary | ICD-10-CM | POA: Diagnosis not present

## 2018-05-27 DIAGNOSIS — M79672 Pain in left foot: Secondary | ICD-10-CM | POA: Diagnosis not present

## 2018-05-27 DIAGNOSIS — M778 Other enthesopathies, not elsewhere classified: Secondary | ICD-10-CM

## 2018-05-27 MED ORDER — METHYLPREDNISOLONE 4 MG PO TBPK
ORAL_TABLET | ORAL | 0 refills | Status: DC
Start: 2018-05-27 — End: 2018-05-31

## 2018-05-27 MED ORDER — MELOXICAM 15 MG PO TABS: 15.0000 mg | ORAL_TABLET | Freq: Every day | ORAL | 0 refills | Status: DC

## 2018-05-27 NOTE — Progress Notes (Signed)
Subjective: Kathy Zimmerman is a 27 y.o. female patient who presents to office for evaluation of Left foot pain. Patient complains of progressive pain especially over the last several months in the left foot after dropping an ironing board past may that is still painful. Ranks pain 7/10 and is now interferring with daily activities and even with immediate walking. Admits to swelling as well. Patient has tried rest and ice with no relief in symptoms. Patient denies any other pedal complaints.  Review of Systems  Musculoskeletal: Positive for joint pain.  All other systems reviewed and are negative.   Patient Active Problem List   Diagnosis Date Noted  . GAD (generalized anxiety disorder) 10/25/2017  . Recurrent vertigo 07/12/2017  . Hypertension 05/30/2017  . POTS (postural orthostatic tachycardia syndrome) 05/29/2017  . Bipolar disorder ([place]) 11/01/2016  . New onset of headaches 09/07/2016  . History of 2 cesarean sections 03/20/2016  . Hx of severe preeclampsia 03/20/2016  . Atypical chest pain 05/31/2015  . Anxiety 04/11/2015  . Retinal detachment of left eye with retinal break 08/08/2011    Class: Acute    Current Outpatient Medications on File Prior to Visit  Medication Sig Dispense Refill  . atenolol (TENORMIN) 25 MG tablet Take 12.5 mg by mouth daily.     . clonazePAM (KLONOPIN) 0.5 MG tablet Take 1 tablet by mouth daily.    . diphenhydrAMINE (BENADRYL) 25 MG tablet Take 1 tablet (25 mg total) by mouth every 6 (six) hours as needed for itching (Rash). (Patient taking differently: Take 50 mg by mouth 2 (two) times daily as needed for itching (Rash). ) 30 tablet 0  . doxepin (SINEQUAN) 10 MG capsule Take 1 capsule (10 mg total) by mouth 3 (three) times daily. 10 capsule 0  . EPINEPHrine (EPIPEN 2-PAK) 0.3 mg/0.3 mL IJ SOAJ injection Inject 0.3 mLs (0.3 mg total) into the muscle once as needed (for severe allergic reaction). CAll 911 immediately if you have to use this medicine 1  Device 2  . medroxyPROGESTERone (DEPO-PROVERA) 150 MG/ML injection Inject 1 mL (150 mg total) into the muscle every 3 (three) months. 1 mL 3  . predniSONE (DELTASONE) 50 MG tablet Take 1 tablet (50 mg total) by mouth daily. 5 tablet 0  . PROAIR HFA 108 (90 Base) MCG/ACT inhaler INHALE 1 TO 2 PUFFS PO Q 4 TO 6 H PRN FOR SOB AND WHEEZING  3   No current facility-administered medications on file prior to visit.     Allergies  Allergen Reactions  . Shrimp [Shellfish Allergy] Swelling  . Sulfa Antibiotics Rash    Objective:  General: Alert and oriented x3 in no acute distress  Dermatology: No open lesions bilateral lower extremities, no webspace macerations, no ecchymosis bilateral, all nails x 10 are well manicured.  Vascular: Dorsalis Pedis and Posterior Tibial pedal pulses palpable, Capillary Fill Time 3 seconds,(+) pedal hair growth bilateral, no edema bilateral lower extremities, Temperature gradient within normal limits.  Neurology: Johney Maine sensation intact via light touch bilateral.  Musculoskeletal: Mild tenderness with palpation at 2-4 metatarsal from MTPJ to shaft on left foot,No pain with calf compression bilateral. There is decreased ankle rom with knee extending  vs flexed resembling gastroc equnius bilateral, Subtalar joint range of motion is within normal limits, there is no 1st ray hypermobility noted bilateral, mild lesser hammertoe. Strength within normal limits in all groups bilateral.   Gait: Minimally Antalgic gait  Xrays  Left Foot   Impression:No fracture or dislocation, mild soft  tissue swelling, mild hammertoe and posterior spur. No other findings.   Assessment and Plan: Problem List Items Addressed This Visit    None    Visit Diagnoses    Capsulitis of left foot    -  Primary   Relevant Medications   meloxicam (MOBIC) 15 MG tablet   methylPREDNISolone (MEDROL DOSEPAK) 4 MG TBPK tablet   Other Relevant Orders   DG Foot Complete Left   Tendonitis        Relevant Medications   meloxicam (MOBIC) 15 MG tablet   methylPREDNISolone (MEDROL DOSEPAK) 4 MG TBPK tablet   Metatarsalgia of left foot       Relevant Medications   meloxicam (MOBIC) 15 MG tablet   methylPREDNISolone (MEDROL DOSEPAK) 4 MG TBPK tablet   Left foot pain       Relevant Medications   meloxicam (MOBIC) 15 MG tablet   methylPREDNISolone (MEDROL DOSEPAK) 4 MG TBPK tablet      -Complete examination performed -Xrays reviewed -Discussed treatement options for tendonitis vs capsulitis  -Rx Mobic and Medrol dosepak to take as instructed -Recommend good supportive shoes and met padding  -Patient to return to office in 4 weeks or sooner if condition worsens.  Landis Martins, DPM

## 2018-05-31 ENCOUNTER — Encounter: Payer: Self-pay | Admitting: Student

## 2018-05-31 ENCOUNTER — Ambulatory Visit (INDEPENDENT_AMBULATORY_CARE_PROVIDER_SITE_OTHER): Payer: PRIVATE HEALTH INSURANCE | Admitting: Student

## 2018-05-31 VITALS — BP 130/70 | HR 96 | Ht 66.0 in | Wt 155.0 lb

## 2018-05-31 DIAGNOSIS — R002 Palpitations: Secondary | ICD-10-CM | POA: Diagnosis not present

## 2018-05-31 DIAGNOSIS — I951 Orthostatic hypotension: Secondary | ICD-10-CM

## 2018-05-31 DIAGNOSIS — I10 Essential (primary) hypertension: Secondary | ICD-10-CM

## 2018-05-31 DIAGNOSIS — G90A Postural orthostatic tachycardia syndrome (POTS): Secondary | ICD-10-CM

## 2018-05-31 DIAGNOSIS — R000 Tachycardia, unspecified: Secondary | ICD-10-CM | POA: Diagnosis not present

## 2018-05-31 DIAGNOSIS — I498 Other specified cardiac arrhythmias: Secondary | ICD-10-CM

## 2018-05-31 MED ORDER — METOPROLOL SUCCINATE ER 25 MG PO TB24: 12.5000 mg | ORAL_TABLET | Freq: Every day | ORAL | 3 refills | Status: DC

## 2018-05-31 NOTE — Progress Notes (Signed)
Cardiology Office Note    Date:  06/01/2018   ID:  Kathy Zimmerman, DOB 11/29/1990, MRN [id number]  PCP:  Celene Squibb, MD  Cardiologist: Jenkins Rouge, MD    Chief Complaint  Patient presents with  . Follow-up    recent palpitations; issues with variable BP    History of Present Illness:    Kathy Zimmerman is a 27 y.o. female with past medical history of palpitations (possibly secondary to POTS), anxiety, and asthma who presents to the office today for evaluation of an elevated heart rate.  She was last examined by Ermalinda Barrios, PA-C on 05/30/2017 and reported her BP had been elevated at 145/88 and felt anxious about her readings. She had self-decreased her Atenolol to 12.5mg  daily and it was advised that she titrate this back to 12.5mg  BID.   She called the office on 05/07/2018 reporting episodes of blood pressure dropping and heart rate increasing over the past several weeks. Attempts were made to reach the patient to obtain additional information but were unsuccessful.  In talking with the patient today, she reports having intermittent episodes of palpitations which typically occurs mostly when ambulating. She does have a Fib Bit along with a pulse oximeter and reports heart rate goes into the 150's with minimal activity but then stabilizes within several seconds. She also reports having intermittent episodes of hypotension and then elevated BP. Reports SBP is sometimes in the low 80's after taking Atenolol but then is elevated into the 140's at times.  When she does experience hypotension, she reports associated dizziness and fatigue. Denies any excessive alcohol or caffeine consumption. Reports she does not consume a regular amount of fluids throughout the day.  No recent dyspnea on exertion, orthopnea, PND, or lower extremity edema.   Past Medical History:  Diagnosis Date  . Anxiety   . Asthma   . Depression   . Headache   . IBS (irritable bowel syndrome)   . Panic attack     . Preeclampsia     Past Surgical History:  Procedure Laterality Date  . CESAREAN SECTION N/A 04/10/2015   Procedure: CESAREAN SECTION;  Surgeon: Crawford Givens, MD;  Location: [facility];  Service: Obstetrics;  Laterality: N/A;  . EYE SURGERY     laser  . SCLERAL BUCKLE  08/09/2011   Procedure: SCLERAL BUCKLE;  Surgeon: Clent Demark Rankin;  Location: [facility];  Service: Ophthalmology;  Laterality: Left;  with cryo  . TENDON REPAIR      Current Medications: Outpatient Medications Prior to Visit  Medication Sig Dispense Refill  . clonazePAM (KLONOPIN) 0.5 MG tablet Take 1 tablet by mouth daily.    . diphenhydrAMINE (BENADRYL) 25 MG tablet Take 1 tablet (25 mg total) by mouth every 6 (six) hours as needed for itching (Rash). (Patient taking differently: Take 50 mg by mouth 2 (two) times daily as needed for itching (Rash). ) 30 tablet 0  . EPINEPHrine (EPIPEN 2-PAK) 0.3 mg/0.3 mL IJ SOAJ injection Inject 0.3 mLs (0.3 mg total) into the muscle once as needed (for severe allergic reaction). CAll 911 immediately if you have to use this medicine 1 Device 2  . medroxyPROGESTERone (DEPO-PROVERA) 150 MG/ML injection Inject 1 mL (150 mg total) into the muscle every 3 (three) months. 1 mL 3  . PROAIR HFA 108 (90 Base) MCG/ACT inhaler INHALE 1 TO 2 PUFFS PO Q 4 TO 6 H PRN FOR SOB AND WHEEZING  3  . atenolol (TENORMIN) 25 MG tablet Take  12.5 mg by mouth daily.     Marland Kitchen buPROPion (WELLBUTRIN XL) 150 MG 24 hr tablet TAKE 1 TABLET BY MOUTH EVERY DAY IN THE MORNING    . doxepin (SINEQUAN) 10 MG capsule Take 1 capsule (10 mg total) by mouth 3 (three) times daily. 10 capsule 0  . gabapentin (NEURONTIN) 100 MG capsule TAKE 1 CAPSULE (100 MG) BY MOUTH 2 TIMES PER DAY    . hydrOXYzine (ATARAX/VISTARIL) 25 MG tablet 1-2 tablets every 6 hours as needed for dizziness, nausea    . lamoTRIgine (LAMICTAL) 25 MG tablet Decrease and then stop: take 50mg  for two days, then take 25mg  for two days, then stop.    Marland Kitchen lithium carbonate  (LITHOBID) 300 MG CR tablet TAKE 1 TAB (300 MG) EACH MORNING AND 2 TAB (600 MG) NIGHTLY    . meloxicam (MOBIC) 15 MG tablet Take 1 tablet (15 mg total) by mouth daily. 30 tablet 0  . methylPREDNISolone (MEDROL DOSEPAK) 4 MG TBPK tablet Take 1st as directed 21 tablet 0  . prednisoLONE acetate (PRED FORTE) 1 % ophthalmic suspension Instill 1 drop in both eyes QID OS x 1 week    . predniSONE (DELTASONE) 50 MG tablet Take 1 tablet (50 mg total) by mouth daily. 5 tablet 0  . propranolol (INDERAL) 10 MG tablet Take 10 mg by mouth at bedtime.  0  . QUEtiapine (SEROQUEL) 50 MG tablet TAKE 3 TABLETS BY MOUTH NIGHTLY. TAKE 1-2 TABLETS BY MOUTH NIGHTLY    . tacrolimus (PROTOPIC) 0.03 % ointment APPLY TO AFFECTED AREA TWICE A DAY AS NEEDED TO EYELIDS  3  . topiramate (TOPAMAX) 100 MG tablet TAKE 1 TABLET BY MOUTH EVERY DAY     No facility-administered medications prior to visit.      Allergies:   Shrimp [shellfish allergy] and Sulfa antibiotics   Social History   Socioeconomic History  . Marital status: Married    Spouse name: Not on file  . Number of children: Not on file  . Years of education: Not on file  . Highest education level: Not on file  Occupational History  . Not on file  Social Needs  . Financial resource strain: Not on file  . Food insecurity:    Worry: Not on file    Inability: Not on file  . Transportation needs:    Medical: Not on file    Non-medical: Not on file  Tobacco Use  . Smoking status: Never Smoker  . Smokeless tobacco: Never Used  Substance and Sexual Activity  . Alcohol use: No  . Drug use: No  . Sexual activity: Not Currently    Birth control/protection: None  Lifestyle  . Physical activity:    Days per week: Not on file    Minutes per session: Not on file  . Stress: Not on file  Relationships  . Social connections:    Talks on phone: Not on file    Gets together: Not on file    Attends religious service: Not on file    Active member of club or  organization: Not on file    Attends meetings of clubs or organizations: Not on file    Relationship status: Not on file  Other Topics Concern  . Not on file  Social History Narrative  . Not on file     Family History:  The patient's family history includes CAD in her maternal grandfather and other; Depression in her father, maternal grandmother, and sister; Heart disease in her  maternal grandfather; Hyperlipidemia in her maternal grandfather; Hypertension in her maternal grandfather; Mental illness in her maternal grandmother and sister; Thyroid disease in her maternal grandmother and other.   Review of Systems:   Please see the history of present illness.     General:  No chills, fever, night sweats or weight changes. Positive for dizziness.  Cardiovascular:  No chest pain, dyspnea on exertion, edema, orthopnea, paroxysmal nocturnal dyspnea. Positive for palpitations.  Dermatological: No rash, lesions/masses Respiratory: No cough, dyspnea Urologic: No hematuria, dysuria Abdominal:   No nausea, vomiting, diarrhea, bright red blood per rectum, melena, or hematemesis Neurologic:  No visual changes, wkns, changes in mental status. All other systems reviewed and are otherwise negative except as noted above.   Physical Exam:    VS:  BP 130/70   Pulse 96   Ht 5\' 6"  (1.676 m)   Wt 155 lb (70.3 kg)   SpO2 98%   BMI 25.02 kg/m    General: Well developed, well nourished Caucasian female appearing in no acute distress. Head: Normocephalic, atraumatic, sclera non-icteric, no xanthomas, nares are without discharge.  Neck: No carotid bruits. JVD not elevated.  Lungs: Respirations regular and unlabored, without wheezes or rales.  Heart: Regular rate and rhythm. No S3 or S4.  No murmur, no rubs, or gallops appreciated. Abdomen: Soft, non-tender, non-distended with normoactive bowel sounds. No hepatomegaly. No rebound/guarding. No obvious abdominal masses. Msk:  Strength and tone appear normal  for age. No joint deformities or effusions. Extremities: No clubbing or cyanosis. No lower extremity edema.  Distal pedal pulses are 2+ bilaterally. Neuro: Alert and oriented X 3. Moves all extremities spontaneously. No focal deficits noted. Psych:  Responds to questions appropriately with a normal affect. Skin: No rashes or lesions noted  Wt Readings from Last 3 Encounters:  05/31/18 155 lb (70.3 kg)  04/14/18 156 lb (70.8 kg)  03/10/18 160 lb (72.6 kg)     Studies/Labs Reviewed:   EKG:  EKG is ordered today.  The ekg ordered today demonstrates NSR, HR 80, with no acute ST changes when compared to prior tracings.   Recent Labs: 03/10/2018: ALT 14; BUN 13; Creatinine, Ser 0.65; Hemoglobin 13.4; Platelets 233; Potassium 4.3; Sodium 139   Lipid Panel No results found for: CHOL, TRIG, HDL, CHOLHDL, VLDL, LDLCALC, LDLDIRECT  Additional studies/ records that were reviewed today include:   ETT: 10/2016  No diagnostic ST segment changes in the setting of lead motion artifact. Duke treadmill score is 3, technically intermediate risk due to nonlimiting chest pain, but no diagnostic ST segment changes or arrhythmias.  Blood pressure demonstrated a normal response to exercise.  Echocardiogram: 10/2016 Study Conclusions  - Left ventricle: The cavity size was normal. Wall thickness was   increased in a pattern of mild LVH. Systolic function was normal.   The estimated ejection fraction was in the range of 55% to 60%.   Wall motion was normal; there were no regional wall motion   abnormalities. Left ventricular diastolic function parameters   were normal. - Right atrium: Central venous pressure (est): 3 mm Hg. - Atrial septum: No defect or patent foramen ovale was identified. - Tricuspid valve: There was trivial regurgitation. - Pulmonary arteries: Systolic pressure could not be accurately   estimated. - Pericardium, extracardiac: There was no pericardial  effusion.  Impressions:  - Mildly increased LV wall thickness with LVEF 55-60% and normal   diastolic function. Trivial tricuspid regurgitation.   Assessment:    1. POTS (postural orthostatic  tachycardia syndrome)   2. Palpitations   3. Essential hypertension      Plan:   In order of problems listed above:  1. Postural Orthostatic Tachycardiac Syndrome/ Palpitations - she describes transient episodes of tachycardia with changing positions and has noticed improvement in her palpitations with Atenolol 12.5mg  daily but also experiences hypotension with this and therefore does not take it daily. Will attempt to switch from Atenolol to Toprol-XL 12.5mg  daily to see if this improves her symptoms. Would not use Midodrine given recent issues with elevated BP. Also recommended increasing her fluid intake to 1.5 -2.0 L per day and utilizing compression stockings. We reviewed the importance of changing positions slowly.   2. HTN - BP is well-controlled at 130/70 during today's visit. - encouraged the patient to follow her BP with the medication adjustments outlined above.     Medication Adjustments/Labs and Tests Ordered: Current medicines are reviewed at length with the patient today.  Concerns regarding medicines are outlined above.  Medication changes, Labs and Tests ordered today are listed in the Patient Instructions below. Patient Instructions  Postural Orthostatic Tachycardia Syndrome Postural orthostatic tachycardia syndrome (POTS) is a group of symptoms that can occur when you stand up after lying down. POTS happens when less blood flows to the heart than normal after you stand up. The reduced blood flow to the heart makes the heart beat rapidly. POTS may be associated with another medical condition, or it may occur on its own. What are the causes? This cause of this condition is not known, but many conditions and diseases have been linked to it. What increases the risk? This  condition is more likely to develop in:  Women 12-53 years old.  Women who are pregnant.  Women who are menstruating.  People who have certain conditions, including: ? A viral infection. ? An autoimmune disease. ? Anemia. ? Dehydration. ? Hyperthyroidism.  People who take certain medicines.  People who have had a major injury.  People who have had surgery.  What are the signs or symptoms? The most common symptom of this condition is lightheadedness after standing up from a lying down position. Other symptoms may include:  Feeling a rapid increase in the speed of the heartbeat (tachycardia) within 10 minutes of standing up.  Fainting.  Weakness.  Confusion.  Trembling.  Shortness of breath.  Sweating or flushing.  Headache.  Chest pain.  Breathing that is deeper and faster than normal (hyperventilation).  Nausea.  Anxiety.  Symptoms may be worse in the morning, and they may be relieved by lying down. How is this diagnosed? This condition is diagnosed based on:  Your symptoms.  Your medical history.  A physical exam.  Measurements of your heart rate when you are lying down and after you stand up.  A measurement of your blood pressure. The measurement will be taken when you go from lying down to standing up.  Blood tests to measure hormones that change with blood pressure. The blood tests will be done when you are lying down and standing up.  You may have other tests to check whether you have a condition or disease that is linked to POTS. How is this treated? Treatment for this condition depends on how severe your symptoms are and whether you have any conditions or diseases that have been linked to POTS. Treatment may involve:  Treating any conditions or diseases that have been linked to POTS.  Drinking two glasses of water before getting up from a  lying position.  Eating more salt (sodium).  Taking medicine to control blood pressure and heart  rate (beta-blocker).  Taking medicines to control blood flow, blood pressure, or heart rate.  Avoiding certain medicines.  Starting an exercise program under the supervision of your health care provider.  Follow these instructions at home:  Eating and drinking  Drink enough fluid to keep your urine clear or pale yellow.  If told by your health care provider, drink two glasses of water before getting up from a lying position.  Follow instructions from your health care provider about how much sodium you should eat.  Eat several small meals a day instead of a few large meals.  Avoid heavy meals. Medicines  Take over-the-counter and prescription medicines only as told by your health care provider.  Talk with your health care provider before starting any new medicines. Activity  Do an aerobic exercise for 20 minutes a day, at least 3 days a week.  Ask your health care provider what kinds of exercise are safe for you. Contact a health care provider if:  Your symptoms do not improve after treatment.  Your symptoms get worse.  You develop new symptoms. Get help right away if:  You have chest pain.  You have difficulty breathing.  You have fainting episodes. This information is not intended to replace advice given to you by your health care provider. Make sure you discuss any questions you have with your health care provider. Document Released: 08/18/2002 Document Revised: 10/06/2015 Document Reviewed: 03/12/2015 Elsevier Interactive Patient Education  2018 [employer].   Your physician recommends that you schedule a follow-up appointment in: 3 MONTHS WITH  Dr.Nishan  STOP ATENOLOL  START TOPROL XL  12.5 MG DAILY  Thank you for choosing [facility] !        NO LAB WORK OPRDERED TODAY   Signed, Erma Heritage, PA-C  06/01/2018 9:16 AM    [facility] [address] [place],  [address] Phone: [phone]

## 2018-05-31 NOTE — Patient Instructions (Addendum)
Postural Orthostatic Tachycardia Syndrome Postural orthostatic tachycardia syndrome (POTS) is a group of symptoms that can occur when you stand up after lying down. POTS happens when less blood flows to the heart than normal after you stand up. The reduced blood flow to the heart makes the heart beat rapidly. POTS may be associated with another medical condition, or it may occur on its own. What are the causes? This cause of this condition is not known, but many conditions and diseases have been linked to it. What increases the risk? This condition is more likely to develop in:  Women 71-69 years old.  Women who are pregnant.  Women who are menstruating.  People who have certain conditions, including: ? A viral infection. ? An autoimmune disease. ? Anemia. ? Dehydration. ? Hyperthyroidism.  People who take certain medicines.  People who have had a major injury.  People who have had surgery.  What are the signs or symptoms? The most common symptom of this condition is lightheadedness after standing up from a lying down position. Other symptoms may include:  Feeling a rapid increase in the speed of the heartbeat (tachycardia) within 10 minutes of standing up.  Fainting.  Weakness.  Confusion.  Trembling.  Shortness of breath.  Sweating or flushing.  Headache.  Chest pain.  Breathing that is deeper and faster than normal (hyperventilation).  Nausea.  Anxiety.  Symptoms may be worse in the morning, and they may be relieved by lying down. How is this diagnosed? This condition is diagnosed based on:  Your symptoms.  Your medical history.  A physical exam.  Measurements of your heart rate when you are lying down and after you stand up.  A measurement of your blood pressure. The measurement will be taken when you go from lying down to standing up.  Blood tests to measure hormones that change with blood pressure. The blood tests will be done when you are  lying down and standing up.  You may have other tests to check whether you have a condition or disease that is linked to POTS. How is this treated? Treatment for this condition depends on how severe your symptoms are and whether you have any conditions or diseases that have been linked to POTS. Treatment may involve:  Treating any conditions or diseases that have been linked to POTS.  Drinking two glasses of water before getting up from a lying position.  Eating more salt (sodium).  Taking medicine to control blood pressure and heart rate (beta-blocker).  Taking medicines to control blood flow, blood pressure, or heart rate.  Avoiding certain medicines.  Starting an exercise program under the supervision of your health care provider.  Follow these instructions at home:  Eating and drinking  Drink enough fluid to keep your urine clear or pale yellow.  If told by your health care provider, drink two glasses of water before getting up from a lying position.  Follow instructions from your health care provider about how much sodium you should eat.  Eat several small meals a day instead of a few large meals.  Avoid heavy meals. Medicines  Take over-the-counter and prescription medicines only as told by your health care provider.  Talk with your health care provider before starting any new medicines. Activity  Do an aerobic exercise for 20 minutes a day, at least 3 days a week.  Ask your health care provider what kinds of exercise are safe for you. Contact a health care provider if:  Your symptoms  do not improve after treatment.  Your symptoms get worse.  You develop new symptoms. Get help right away if:  You have chest pain.  You have difficulty breathing.  You have fainting episodes. This information is not intended to replace advice given to you by your health care provider. Make sure you discuss any questions you have with your health care provider. Document  Released: 08/18/2002 Document Revised: 10/06/2015 Document Reviewed: 03/12/2015 Elsevier Interactive Patient Education  2018 [employer].   Your physician recommends that you schedule a follow-up appointment in: 3 MONTHS WITH  Dr.Nishan    STOP ATENOLOL   START TOPROL XL  12.5 MG DAILY        Thank you for choosing [facility] !              nO LAB WORK [place]

## 2018-06-01 ENCOUNTER — Encounter: Payer: Self-pay | Admitting: Student

## 2018-06-11 ENCOUNTER — Other Ambulatory Visit: Payer: PRIVATE HEALTH INSURANCE | Admitting: Women's Health

## 2018-06-25 ENCOUNTER — Ambulatory Visit: Payer: PRIVATE HEALTH INSURANCE | Admitting: Sports Medicine

## 2018-07-13 ENCOUNTER — Encounter (HOSPITAL_COMMUNITY): Payer: Self-pay | Admitting: Emergency Medicine

## 2018-07-13 ENCOUNTER — Other Ambulatory Visit: Payer: Self-pay

## 2018-07-13 ENCOUNTER — Emergency Department (HOSPITAL_COMMUNITY)
Admission: EM | Admit: 2018-07-13 | Discharge: 2018-07-13 | Disposition: A | Discharge status: Home / Self Care | Payer: PRIVATE HEALTH INSURANCE | Attending: Emergency Medicine | Admitting: Emergency Medicine

## 2018-07-13 DIAGNOSIS — T781XXA Other adverse food reactions, not elsewhere classified, initial encounter: Secondary | ICD-10-CM | POA: Diagnosis not present

## 2018-07-13 DIAGNOSIS — Z79899 Other long term (current) drug therapy: Secondary | ICD-10-CM | POA: Insufficient documentation

## 2018-07-13 DIAGNOSIS — T7840XA Allergy, unspecified, initial encounter: Secondary | ICD-10-CM

## 2018-07-13 DIAGNOSIS — L299 Pruritus, unspecified: Secondary | ICD-10-CM | POA: Insufficient documentation

## 2018-07-13 DIAGNOSIS — J45909 Unspecified asthma, uncomplicated: Secondary | ICD-10-CM | POA: Diagnosis not present

## 2018-07-13 DIAGNOSIS — I10 Essential (primary) hypertension: Secondary | ICD-10-CM | POA: Diagnosis not present

## 2018-07-13 MED ORDER — ONDANSETRON 4 MG PO TBDP: 4.0000 mg | ORAL_TABLET | Freq: Three times a day (TID) | ORAL | 0 refills | Status: DC | PRN

## 2018-07-13 MED ORDER — METHYLPREDNISOLONE SODIUM SUCC 125 MG IJ SOLR
125.0000 mg | Freq: Once | INTRAMUSCULAR | Status: AC
  Administered 2018-07-13: 125 mg via INTRAVENOUS
  Filled 2018-07-13: qty 2

## 2018-07-13 MED ORDER — DIPHENHYDRAMINE HCL 50 MG/ML IJ SOLN
25.0000 mg | Freq: Once | INTRAMUSCULAR | Status: AC
  Administered 2018-07-13: 25 mg via INTRAVENOUS
  Filled 2018-07-13: qty 1

## 2018-07-13 MED ORDER — PREDNISONE 20 MG PO TABS: 40.0000 mg | ORAL_TABLET | Freq: Every day | ORAL | 0 refills | Status: DC

## 2018-07-13 MED ORDER — ONDANSETRON HCL 4 MG/2ML IJ SOLN
4.0000 mg | Freq: Once | INTRAMUSCULAR | Status: AC
  Administered 2018-07-13: 4 mg via INTRAVENOUS
  Filled 2018-07-13: qty 2

## 2018-07-13 NOTE — ED Triage Notes (Signed)
Pt states she was eating last night and she felt that her throat began to swell. Pt took 2 benadryl with relief. Pt now stating that she feels her throat is swelling again. Pt in NAD at this time.

## 2018-07-13 NOTE — ED Provider Notes (Signed)
[facility] EMERGENCY DEPARTMENT Provider Note   CSN: [id number] Arrival date & time: 07/13/18  0423     History   Chief Complaint Chief Complaint  Patient presents with  . Allergic Reaction    HPI Kathy Zimmerman is a 27 y.o. female.  HPI  The patient is a 27 year old female who reports to me that she has a history of prior allergic reactions and in fact she knows that she is allergic to shellfish and fish.  She has had an allergic reaction to shrimp as recently as a couple of months ago which landed her in the emergency department receiving steroids and antihistamines which improved her symptoms.  This evening she was back at the same restaurant but had steak instead of the shrimp, shortly after eating she developed recurrent symptoms stating that she had itching and swelling in the back of her throat and also a feeling of nausea and diarrhea which has been going on today as well.  She has recently undergone an illness where she had fevers chills and body aches but that has totally resolved within the last 2 days.  The patient denies any coughing, shortness of breath, she does feel a burning sensation in her chest.  She denies blurred vision, headache, syncope or rashes or itching to the skin.  She has had Benadryl prior to arrival with improvement  Past Medical History:  Diagnosis Date  . Anxiety   . Asthma   . Depression   . Headache   . IBS (irritable bowel syndrome)   . Panic attack   . Preeclampsia     Patient Active Problem List   Diagnosis Date Noted  . GAD (generalized anxiety disorder) 10/25/2017  . Recurrent vertigo 07/12/2017  . Hypertension 05/30/2017  . POTS (postural orthostatic tachycardia syndrome) 05/29/2017  . Bipolar disorder ([place]) 11/01/2016  . New onset of headaches 09/07/2016  . History of 2 cesarean sections 03/20/2016  . Hx of severe preeclampsia 03/20/2016  . Atypical chest pain 05/31/2015  . Anxiety 04/11/2015  . Retinal detachment of left eye  with retinal break 08/08/2011    Class: Acute    Past Surgical History:  Procedure Laterality Date  . CESAREAN SECTION N/A 04/10/2015   Procedure: CESAREAN SECTION;  Surgeon: Crawford Givens, MD;  Location: [facility];  Service: Obstetrics;  Laterality: N/A;  . EYE SURGERY     laser  . SCLERAL BUCKLE  08/09/2011   Procedure: SCLERAL BUCKLE;  Surgeon: Clent Demark Rankin;  Location: [facility];  Service: Ophthalmology;  Laterality: Left;  with cryo  . TENDON REPAIR       OB History    Gravida  2   Para  2   Term      Preterm  2   AB      Living  2     SAB      TAB      Ectopic      Multiple  0   Live Births  2            Home Medications    Prior to Admission medications   Medication Sig Start Date End Date Taking? Authorizing Provider  clonazePAM (KLONOPIN) 0.5 MG tablet Take 1 tablet by mouth daily. 10/25/17   [provider]  diphenhydrAMINE (BENADRYL) 25 MG tablet Take 1 tablet (25 mg total) by mouth every 6 (six) hours as needed for itching (Rash). Patient taking differently: Take 50 mg by mouth 2 (two) times daily as  needed for itching (Rash).  06/23/17   Muthersbaugh, Jarrett Soho, PA-C  EPINEPHrine (EPIPEN 2-PAK) 0.3 mg/0.3 mL IJ SOAJ injection Inject 0.3 mLs (0.3 mg total) into the muscle once as needed (for severe allergic reaction). CAll 911 immediately if you have to use this medicine 06/23/17   Muthersbaugh, Jarrett Soho, PA-C  medroxyPROGESTERone (DEPO-PROVERA) 150 MG/ML injection Inject 1 mL (150 mg total) into the muscle every 3 (three) months. 05/03/18   Roma Schanz, CNM  metoprolol succinate (TOPROL XL) 25 MG 24 hr tablet Take 0.5 tablets (12.5 mg total) by mouth daily. 05/31/18   Strader, Fransisco Hertz, PA-C  ondansetron (ZOFRAN ODT) 4 MG disintegrating tablet Take 1 tablet (4 mg total) by mouth every 8 (eight) hours as needed for nausea. 07/13/18   Noemi Chapel, MD  predniSONE (DELTASONE) 20 MG tablet Take 2 tablets (40 mg total) by mouth daily. 07/13/18    Noemi Chapel, MD  PROAIR HFA [phone] Base) MCG/ACT inhaler INHALE 1 TO 2 PUFFS PO Q 4 TO 6 H PRN FOR SOB AND WHEEZING 11/10/16   [provider]    Family History Family History  Problem Relation Age of Onset  . Depression Father   . Depression Sister   . Mental illness Sister   . Depression Maternal Grandmother   . Mental illness Maternal Grandmother   . Thyroid disease Maternal Grandmother   . Heart disease Maternal Grandfather   . Hypertension Maternal Grandfather   . Hyperlipidemia Maternal Grandfather   . CAD Maternal Grandfather   . Thyroid disease Other   . CAD Other     Social History Social History   Tobacco Use  . Smoking status: Never Smoker  . Smokeless tobacco: Never Used  Substance Use Topics  . Alcohol use: No  . Drug use: No     Allergies   Shrimp [shellfish allergy] and Sulfa antibiotics   Review of Systems Review of Systems  All other systems reviewed and are negative.    Physical Exam Updated Vital Signs BP (!) 135/98 (BP Location: Left Arm) Comment: Simultaneous filing. User may not have seen previous data.  Pulse 78 Comment: Simultaneous filing. User may not have seen previous data.  Temp 98.4 F (36.9 C) (Oral)   Resp 18   LMP 06/05/2018   SpO2 100% Comment: Simultaneous filing. User may not have seen previous data.  Physical Exam  Constitutional: She appears well-developed and well-nourished. No distress.  HENT:  Head: Normocephalic and atraumatic.  Mouth/Throat: Oropharynx is clear and moist. No oropharyngeal exudate.  The visualized structures of the throat are normal, there is no edema of the uvula, the sublingual space is normal, phonation is normal, tongue and lips are normal, there is no asymmetry in the posterior pharynx and the tonsils are visualized without exudate asymmetry hypertrophy or erythema  Eyes: Pupils are equal, round, and reactive to light. Conjunctivae and EOM are normal. Right eye exhibits no discharge.  Left eye exhibits no discharge. No scleral icterus.  Neck: Normal range of motion. Neck supple. No JVD present. No thyromegaly present.  Cardiovascular: Normal rate, regular rhythm, normal heart sounds and intact distal pulses. Exam reveals no gallop and no friction rub.  No murmur heard. Pulmonary/Chest: Effort normal and breath sounds normal. No respiratory distress. She has no wheezes. She has no rales.  Lung sounds are clear, she is not tachypneic, she has normal work of breathing, speaks in full sentences, no wheezing at all  Abdominal: Soft. Bowel sounds are normal. She exhibits  no distension and no mass. There is no tenderness.  The abdomen is soft and nontender  Musculoskeletal: Normal range of motion. She exhibits no edema or tenderness.  Lymphadenopathy:    She has no cervical adenopathy.  Neurological: She is alert. Coordination normal.  Skin: Skin is warm and dry. No rash noted. No erythema.  No rash to the skin  Psychiatric: She has a normal mood and affect. Her behavior is normal.  Nursing note and vitals reviewed.    ED Treatments / Results  Labs (all labs ordered are listed, but only abnormal results are displayed) Labs Reviewed - No data to display  EKG None  Radiology No results found.  Procedures Procedures (including critical care time)  Medications Ordered in ED Medications  diphenhydrAMINE (BENADRYL) injection 25 mg (25 mg Intravenous Given 07/13/18 0453)  ondansetron (ZOFRAN) injection 4 mg (4 mg Intravenous Given 07/13/18 0453)  methylPREDNISolone sodium succinate (SOLU-MEDROL) 125 mg/2 mL injection 125 mg (125 mg Intravenous Given 07/13/18 0454)     Initial Impression / Assessment and Plan / ED Course  I have reviewed the triage vital signs and the nursing notes.  Pertinent labs & imaging results that were available during my care of the patient were reviewed by me and considered in my medical decision making (see chart for details).  Clinical  Course as of Jul 14 551  Sat Jul 13, 2018  5732 Improved after meds - will observe for another hour, pt is stable appearing    [BM]  0552 Rechecked at 5:50 AM, the patient has no signs of swelling, states that she is feeling better, she appears stable for discharge.  No signs of anaphylaxis.  The patient was cautioned and educated on the exposure to foods and how this can cause recurring symptoms, she expressed her understanding   [BM]    Clinical Course User Index [BM] Noemi Chapel, MD   The patient has subjective feelings of possible allergic reaction, how the nausea and diarrhea come into play is not clear but at this time it seems that the patient is likely having some component of a GI illness as well as an allergic reaction.  She is not tachycardic or hypotensive, she has no urticaria.  Her throat appears normal.  She will receive Zofran as well as treatment for an allergic reaction with Benadryl and Solu-Medrol.  The patient expressed understanding and agreement with the plan.  I have cautioned the patient against eating food that has potentially been cooked on the same surface as seafood and she agreed.  Final Clinical Impressions(s) / ED Diagnoses   Final diagnoses:  Allergic reaction, initial encounter    ED Discharge Orders         Ordered    predniSONE (DELTASONE) 20 MG tablet  Daily     07/13/18 0551    ondansetron (ZOFRAN ODT) 4 MG disintegrating tablet  Every 8 hours PRN     07/13/18 0551           Noemi Chapel, MD 07/13/18 [phone]

## 2018-07-13 NOTE — Discharge Instructions (Signed)
You have been diagnosed with an allergic reaction. Usually allergic reactions like this are caused by exposures to something that you either ate or touched or smelled.  It may be related to a number of different exposures including a new perfume, topical creams, soaps, detergents, linens, clothing, medications. Occasionally we do not find an answer for why there is an allergic reaction. These are treated the same way including Benadryl as needed for itching and rash. (This can be used up to 50 mg every 6 hours as needed).  Pepcid 20 mg every night and prednisone once a day for 5 days. Please do not take the Benadryl and drive or take care of children or other imported duties as the Benadryl can make you sleepy.    If you should develop severe or worsening symptoms including difficulty breathing, difficulty swallowing, wheezing or increased coughing or a rash that developed on the inside of your mouth or a worsening rash on your skin, return to the hospital immediately for a recheck. Please call your Dr. in the morning for a recheck in 2 days if you are still having symptoms. If you do not have a Dr. see the list below.  If we have identified the source of your allergic reaction, please avoid this at all costs. This means stopping the medication if it is a new medication or a voiding topical exposures such as creams lotions body soaps or deodorants if this is the source.  Allergic Reaction, Mild to Moderate Allergies may happen from anything your body is sensitive to. This may be food, medications, pollens, chemicals, and nearly anything around you in everyday life that produces allergens. An allergen is anything that causes an allergy producing substance. Allergens cause your body to release allergic antibodies. Through a chain of events, they cause a release of histamine into the blood stream. Histamines are meant to protect you, but they also cause your discomfort. This is why antihistamines are often used  for allergies. Heredity is often a factor in causing allergic reactions. This means you may have some of the same allergies as your parents. Allergies happen in all age groups. You may have some idea of what caused your reaction. There are many allergens around us. It may be difficult to know what caused your reaction. If this is a first time event, it may never happen again. Allergies cannot be cured but can be controlled with medications. SYMPTOMS  You may get some or all of the following problems from allergies. Swelling and itching in and around the mouth.  Tearing, itchy eyes.  Nasal congestion and runny nose.  Sneezing and coughing.  An itchy red rash or hives.  Vomiting or diarrhea.  Difficulty breathing.  Seasonal allergies occur in all age groups. They are seasonal because they usually occur during the same season every year. They may be a reaction to molds, grass pollens, or tree pollens. Other causes of allergies are house dust mite allergens, pet dander and mold spores. These are just a common few of the thousands of allergens around us. All of the symptoms listed above happen when you come in contact with pollens and other allergens. Seasonal allergies are usually not life threatening. They are generally more of a nuisance that can often be handled using medications. Hay fever is a combination of all or some of the above listed allergy problems. It may often be treated with simple over-the-counter medications such as diphenhydramine. Take medication as directed. Check with your caregiver or package   insert for child dosages. TREATMENT AND HOME CARE INSTRUCTIONS If hives or rash are present: Take medications as directed.  You may use an over-the-counter antihistamine (diphenhydramine) for hives and itching as needed. Do not drive or drink alcohol until medications used to treat the reaction have worn off. Antihistamines tend to make people sleepy.  Apply cold cloths (compresses) to the  skin or take baths in cool water. This will help itching. Avoid hot baths or showers. Heat will make a rash and itching worse.  If your allergies persist and become more severe, and over the counter medications are not effective, there are many new medications your caretaker can prescribe. Immunotherapy or desensitizing injections can be used if all else fails. Follow up with your caregiver if problems continue.  SEEK MEDICAL CARE IF:  Your allergies are becoming progressively more troublesome.  You suspect a food allergy. Symptoms generally happen within 30 minutes of eating a food.  Your symptoms have not gone away within 2 days or are getting worse.  You develop new symptoms.  You want to retest yourself or your child with a food or drink you think causes an allergic reaction. Never test yourself or your child of a suspected allergy without being under the watchful eye of your caregivers. A second exposure to an allergen may be life-threatening.  SEEK IMMEDIATE MEDICAL CARE IF: You develop difficulty breathing or wheezing, or have a tight feeling in your chest or throat.  You develop a swollen mouth, hives, swelling, or itching all over your body.  A severe reaction with any of the above problems should be considered life-threatening. If you suddenly develop difficulty breathing call for local emergency medical help. THIS IS AN EMERGENCY. MAKE SURE YOU:  Understand these instructions.  Will watch your condition.  Will get help right away if you are not doing well or get worse.  Document Released: 06/25/2007 Document Revised: 08/17/2011 Document Reviewed: 06/25/2007 [employer] Patient Information 2012 [employer].      

## 2018-07-23 ENCOUNTER — Other Ambulatory Visit: Payer: Self-pay

## 2018-07-23 ENCOUNTER — Encounter (HOSPITAL_COMMUNITY): Payer: Self-pay | Admitting: Registered Nurse

## 2018-07-23 ENCOUNTER — Emergency Department (HOSPITAL_COMMUNITY)
Admission: EM | Admit: 2018-07-23 | Discharge: 2018-07-23 | Disposition: A | Discharge status: Home / Self Care | Payer: PRIVATE HEALTH INSURANCE | Attending: Emergency Medicine | Admitting: Emergency Medicine

## 2018-07-23 DIAGNOSIS — F332 Major depressive disorder, recurrent severe without psychotic features: Secondary | ICD-10-CM | POA: Insufficient documentation

## 2018-07-23 DIAGNOSIS — F339 Major depressive disorder, recurrent, unspecified: Secondary | ICD-10-CM

## 2018-07-23 DIAGNOSIS — R45851 Suicidal ideations: Secondary | ICD-10-CM | POA: Diagnosis not present

## 2018-07-23 DIAGNOSIS — J45909 Unspecified asthma, uncomplicated: Secondary | ICD-10-CM | POA: Insufficient documentation

## 2018-07-23 DIAGNOSIS — I10 Essential (primary) hypertension: Secondary | ICD-10-CM | POA: Insufficient documentation

## 2018-07-23 DIAGNOSIS — Z79899 Other long term (current) drug therapy: Secondary | ICD-10-CM | POA: Insufficient documentation

## 2018-07-23 LAB — RAPID URINE DRUG SCREEN, HOSP PERFORMED
Amphetamines: NOT DETECTED
Barbiturates: NOT DETECTED
Benzodiazepines: NOT DETECTED
Cocaine: NOT DETECTED
Opiates: NOT DETECTED
Tetrahydrocannabinol: NOT DETECTED

## 2018-07-23 LAB — I-STAT CHEM 8, ED
BUN: 14 mg/dL (ref 6–20)
Calcium, Ion: 1.18 mmol/L (ref 1.15–1.40)
Chloride: 106 mmol/L (ref 98–111)
Creatinine, Ser: 0.8 mg/dL (ref 0.44–1.00)
Glucose, Bld: 90 mg/dL (ref 70–99)
HCT: 42 % (ref 36.0–46.0)
Hemoglobin: 14.3 g/dL (ref 12.0–15.0)
Potassium: 3.8 mmol/L (ref 3.5–5.1)
Sodium: 140 mmol/L (ref 135–145)
TCO2: 25 mmol/L (ref 22–32)

## 2018-07-23 LAB — I-STAT BETA HCG BLOOD, ED (MC, WL, AP ONLY): I-stat hCG, quantitative: <5 m[IU]/mL (ref <5)

## 2018-07-23 MED ORDER — ALBUTEROL SULFATE HFA 108 (90 BASE) MCG/ACT IN AERS: 2.0000 | INHALATION_SPRAY | RESPIRATORY_TRACT | Status: DC | PRN

## 2018-07-23 MED ORDER — ALUM & MAG HYDROXIDE-SIMETH 200-200-20 MG/5ML PO SUSP: 30.0000 mL | Freq: Four times a day (QID) | ORAL | Status: DC | PRN

## 2018-07-23 MED ORDER — METOPROLOL SUCCINATE ER 25 MG PO TB24: 12.5000 mg | ORAL_TABLET | Freq: Every day | ORAL | Status: DC

## 2018-07-23 MED ORDER — ACETAMINOPHEN 325 MG PO TABS: 650.0000 mg | ORAL_TABLET | ORAL | Status: DC | PRN

## 2018-07-23 MED ORDER — ZOLPIDEM TARTRATE 5 MG PO TABS: 5.0000 mg | ORAL_TABLET | Freq: Every evening | ORAL | Status: DC | PRN

## 2018-07-23 MED ORDER — ONDANSETRON HCL 4 MG PO TABS: 4.0000 mg | ORAL_TABLET | Freq: Three times a day (TID) | ORAL | Status: DC | PRN

## 2018-07-23 NOTE — Consult Note (Signed)
  Tele psych Assessment   Kathy Zimmerman, 27 y.o., female patient seen via tele psych by TTS and this provider; chart reviewed and consulted with Dr. Dwyane Dee on 07/23/18.  On evaluation Kathy Zimmerman reports that she was having some suicidal thoughts earlier and made some superficial cuts with aluminum  foil pill pack.  At this time patient denies suicidal/self-harm/homicidal ideation, psychosis, and paranoia. During evaluation Kathy Zimmerman is alert/oriented x 4; calm/cooperative; and mood is congruent with affect.  She does not appear to be responding to internal/external stimuli or delusional thoughts; and denies suicidal/self-harm/homicidal ideation, psychosis, and paranoia.  Patient answered question appropriately. Husband and friend at bed side and states that he feels safe with patient coming home.     A detailed risk assessment has been completed based on clinical exam and individual risk factors.  Patient acute suicide risk is low; and a safety plan has been created jointly which involves patient following up with current outpatient psychiatric provider and therapist.     At this time patient is educated and verbalizes understanding of mental health resources and other crisis services in the community. They are instructed to call 911 and present to the nearest emergency room should they experience any suicidal/homicidal ideation, auditory/visual/hallucinations, or detrimental worsening of their mental health condition. Writer also advised the patient to call the toll free phone on insurance card to assist with identifying in network counselors and agencies  For detailed note see TTS tele assessment note  Recommendations:  Referral/resources for [place] given.  Follow up with current psychiatric provider  Disposition:  Patient is psychiatrically cleared  No evidence of imminent risk to self or others at present.   Patient does not meet criteria for psychiatric inpatient admission. Supportive  therapy provided about ongoing stressors. Discussed crisis plan, support from social network, calling 911, coming to the Emergency Department, and calling Suicide Hotline.  Spoke with Jacqlyn Larsen, RN informed of above recommendation and disposition; She will inform Dr. Annye Rusk, NP
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
## 2018-07-23 NOTE — ED Provider Notes (Signed)
[facility] EMERGENCY DEPARTMENT Provider Note   CSN: [id number] Arrival date & time: 07/23/18  1524     History   Chief Complaint: "I have thoughts of hurting myself." No chief complaint on file.  HPI Kathy Zimmerman is a 27 y.o. female presenting with constant suicidal thoughts, depression, and anxiety onset today. Patient denies a current suicidal plan. Patient reports she was overwhelmed with her kids and had thoughts about hurting herself. Patient reports she felt angry, hopeless, and depressed. Patient describes she took 3-4 Klonopin 0.5mg  for her symptoms without relief. Patient also states she tried to cut her wrists, but did not have a weapon and scratched her wrists instead.  Patient states she has a therapist and follows up her mental health care at [facility] with Dr. Michele Mcalpine. Patient reports fatigue, body aches, a bilateral headache, and vision changes described as dark spots. Patient denies chest pain, shortness of breath, Patient denies auditory or visual hallucinations. Patient denies homicidal ideations. The patient denies substances of abuse. The patients demeanor is depressed and tearful. The patient was brought to ED by friends. The patient is here voluntarily.   HPI  Past Medical History:  Diagnosis Date  . Anxiety   . Asthma   . Depression   . Headache   . IBS (irritable bowel syndrome)   . Panic attack   . Preeclampsia     Patient Active Problem List   Diagnosis Date Noted  . GAD (generalized anxiety disorder) 10/25/2017  . Recurrent vertigo 07/12/2017  . Hypertension 05/30/2017  . POTS (postural orthostatic tachycardia syndrome) 05/29/2017  . Bipolar disorder ([place]) 11/01/2016  . New onset of headaches 09/07/2016  . History of 2 cesarean sections 03/20/2016  . Hx of severe preeclampsia 03/20/2016  . Atypical chest pain 05/31/2015  . Anxiety 04/11/2015  . Retinal detachment of left eye with retinal break 08/08/2011    Class: Acute     Past Surgical History:  Procedure Laterality Date  . CESAREAN SECTION N/A 04/10/2015   Procedure: CESAREAN SECTION;  Surgeon: Crawford Givens, MD;  Location: [facility];  Service: Obstetrics;  Laterality: N/A;  . EYE SURGERY     laser  . SCLERAL BUCKLE  08/09/2011   Procedure: SCLERAL BUCKLE;  Surgeon: Clent Demark Rankin;  Location: [facility];  Service: Ophthalmology;  Laterality: Left;  with cryo  . TENDON REPAIR       OB History    Gravida  2   Para  2   Term      Preterm  2   AB      Living  2     SAB      TAB      Ectopic      Multiple  0   Live Births  2            Home Medications    Prior to Admission medications   Medication Sig Start Date End Date Taking? Authorizing Provider  clonazePAM (KLONOPIN) 0.5 MG tablet Take 1 tablet by mouth daily. 10/25/17   [provider]  diphenhydrAMINE (BENADRYL) 25 MG tablet Take 1 tablet (25 mg total) by mouth every 6 (six) hours as needed for itching (Rash). Patient taking differently: Take 50 mg by mouth 2 (two) times daily as needed for itching (Rash).  06/23/17   Muthersbaugh, Jarrett Soho, PA-C  EPINEPHrine (EPIPEN 2-PAK) 0.3 mg/0.3 mL IJ SOAJ injection Inject 0.3 mLs (0.3 mg total) into the muscle once as needed (for severe  allergic reaction). CAll 911 immediately if you have to use this medicine 06/23/17   Muthersbaugh, Jarrett Soho, PA-C  medroxyPROGESTERone (DEPO-PROVERA) 150 MG/ML injection Inject 1 mL (150 mg total) into the muscle every 3 (three) months. 05/03/18   Roma Schanz, CNM  metoprolol succinate (TOPROL XL) 25 MG 24 hr tablet Take 0.5 tablets (12.5 mg total) by mouth daily. 05/31/18   Strader, Fransisco Hertz, PA-C  ondansetron (ZOFRAN ODT) 4 MG disintegrating tablet Take 1 tablet (4 mg total) by mouth every 8 (eight) hours as needed for nausea. 07/13/18   Noemi Chapel, MD  predniSONE (DELTASONE) 20 MG tablet Take 2 tablets (40 mg total) by mouth daily. 07/13/18   Noemi Chapel, MD  PROAIR HFA 108 [phone] Base) MCG/ACT  inhaler INHALE 1 TO 2 PUFFS PO Q 4 TO 6 H PRN FOR SOB AND WHEEZING 11/10/16   [provider]    Family History Family History  Problem Relation Age of Onset  . Depression Father   . Depression Sister   . Mental illness Sister   . Depression Maternal Grandmother   . Mental illness Maternal Grandmother   . Thyroid disease Maternal Grandmother   . Heart disease Maternal Grandfather   . Hypertension Maternal Grandfather   . Hyperlipidemia Maternal Grandfather   . CAD Maternal Grandfather   . Thyroid disease Other   . CAD Other     Social History Social History   Tobacco Use  . Smoking status: Never Smoker  . Smokeless tobacco: Never Used  Substance Use Topics  . Alcohol use: No  . Drug use: No     Allergies   Shrimp [shellfish allergy] and Sulfa antibiotics   Review of Systems Review of Systems  Constitutional: Positive for fatigue. Negative for activity change, appetite change, fever and unexpected weight change.  Eyes: Positive for visual disturbance.  Respiratory: Negative for shortness of breath.   Cardiovascular: Negative for chest pain.  Gastrointestinal: Negative for abdominal pain, nausea and vomiting.  Endocrine: Negative for cold intolerance and heat intolerance.  Genitourinary: Negative for dysuria.  Musculoskeletal: Negative for back pain.  Skin: Negative for rash.  Allergic/Immunologic: Negative for immunocompromised state.  Neurological: Positive for headaches. Negative for dizziness, syncope, weakness and numbness.  Psychiatric/Behavioral: Positive for dysphoric mood, self-injury and suicidal ideas. Negative for agitation, behavioral problems, confusion, decreased concentration, hallucinations and sleep disturbance. The patient is nervous/anxious. The patient is not hyperactive.      Physical Exam Updated Vital Signs BP (!) 140/97 (BP Location: Right Arm)   Pulse 97   Temp 97.6 F (36.4 C) (Oral)   Resp 16   Ht 5\' 6"  (1.676 m)   Wt 67.1  kg   LMP 06/08/2018 (Approximate)   SpO2 100%   BMI 23.89 kg/m   Physical Exam  Constitutional: She appears well-developed and well-nourished. No distress.  HENT:  Head: Normocephalic and atraumatic.  Eyes: Pupils are equal, round, and reactive to light. Conjunctivae and EOM are normal.  Neck: Normal range of motion. Neck supple.  Cardiovascular: Normal rate, regular rhythm and normal heart sounds. Exam reveals no gallop and no friction rub.  No murmur heard. Pulmonary/Chest: Effort normal and breath sounds normal. No respiratory distress. She has no wheezes. She has no rales.  Abdominal: Soft. There is no tenderness.  Musculoskeletal: Normal range of motion.  Neurological: She is alert.  Skin: No lesion and no rash noted. She is not diaphoretic. No erythema.  Psychiatric: Her speech is normal and behavior is normal. Her  mood appears anxious. Her affect is not angry, not blunt, not labile and not inappropriate. She is not actively hallucinating. Thought content is not paranoid and not delusional. She does not express impulsivity or inappropriate judgment. She exhibits a depressed mood. She expresses suicidal ideation. She expresses no homicidal ideation. She expresses no suicidal plans and no homicidal plans.  Patient became tearful throughout the history. She is attentive.  Nursing note and vitals reviewed.  Mental Status:  Alert, oriented, thought content appropriate, able to give a coherent history. Speech fluent without evidence of aphasia. Able to follow 2 step commands without difficulty.   Cranial Nerves:  II:  Peripheral visual fields grossly normal, pupils equal, round, reactive to light III,IV, VI: ptosis not present, extra-ocular motions intact bilaterally  V,VII: smile symmetric, facial light touch sensation equal VIII: hearing grossly normal to voice  X: uvula elevates symmetrically  XI: bilateral shoulder shrug symmetric and strong XII: midline tongue extension without  fassiculations Motor:  Normal tone. 5/5 in upper and lower extremities bilaterally including strong and equal grip strength and dorsiflexion/plantar flexion Sensory: light touch normal in all extremities.  Gait: normal gait and balance.    ED Treatments / Results  Labs (all labs ordered are listed, but only abnormal results are displayed) Labs Reviewed  RAPID URINE DRUG SCREEN, HOSP PERFORMED  I-STAT CHEM 8, ED  I-STAT BETA HCG BLOOD, ED ([facility], [facility], [facility] ONLY)    EKG None  Radiology No results found.  Procedures Procedures (including critical care time)  Medications Ordered in ED Medications  acetaminophen (TYLENOL) tablet 650 mg (has no administration in time range)  zolpidem (AMBIEN) tablet 5 mg (has no administration in time range)  ondansetron (ZOFRAN) tablet 4 mg (has no administration in time range)  alum & mag hydroxide-simeth (MAALOX/MYLANTA) 200-200-20 MG/5ML suspension 30 mL (has no administration in time range)  metoprolol succinate (TOPROL-XL) 24 hr tablet 12.5 mg (has no administration in time range)  albuterol (PROVENTIL HFA;VENTOLIN HFA) 108 (90 Base) MCG/ACT inhaler 2 puff (has no administration in time range)     Initial Impression / Assessment and Plan / ED Course  I have reviewed the triage vital signs and the nursing notes.  Pertinent labs & imaging results that were available during my care of the patient were reviewed by me and considered in my medical decision making (see chart for details).     Patient presents to the ER with a number of risk factors for suicide for example the patient has a history of depression, past hospitalizations for depression, and self injury. In addition the patient has a number of protective factors for example the patient does not appear to be psychotic, is here voluntarily, is speaking openly about their current situation, discusses future plans & they have a good support system.  Current Plan is to have patient be evaluated  by Psyc for further assessment on weather or not to be placed inpatient.  We have discussed that If the patient feels she was becoming unsafe, instead of acting on an impulse of self harm she will contact the crisis line, speak to friends about it, or return to the emergency department. Patient has been cleared to move to [facility] pending further assessment.    Final Clinical Impressions(s) / ED Diagnoses   Final diagnoses:  Suicidal ideation    ED Discharge Orders    None       Arville Lime, [place] 07/23/18 [place] Kevin, MD 07/23/18 1722

## 2018-07-23 NOTE — ED Notes (Addendum)
ALL belongings - 2 labeled belongings bags - returned to pt - Pt verified all items present. [place] information given w/discharge paperwork - voiced understanding to follow up w/psych.

## 2018-07-23 NOTE — ED Notes (Signed)
Kathy Zimmerman, [facility] Counselor, in w/pt.

## 2018-07-23 NOTE — ED Triage Notes (Signed)
Pt to ED for evaluation of worsening SI today. Hx depression and anxiety. Today tried to cut herself but ended up only scratching her wrists. Took 3 extra Clonopin, not in a suicide attempt, but for relief. Pt states she feels hopeless but feels more calm that before.

## 2018-07-23 NOTE — Discharge Instructions (Addendum)
Please call your psychiatrist and mental health team for follow-up

## 2018-07-23 NOTE — ED Notes (Addendum)
Staffing Office aware of need for [place]. Dinner tray ordered. Pt arrived to [facility] - ambulatory - noted to be wearing burgundy scrubs and her eyeglasses. Spouse and friend w/pt. Spouse advised he will take pt's belongings if pt is recommended for inpt - 2 labeled belongings bags placed at nurses' desk. Pt states she feels hopeless - denies SI/HI. States she attempted to cut her left anterior wrist w/a medication packet in an attempt to obtain relief - denies SI attempt. Superficial pink scratch marks noted - no bleeding, redness noted. States she had also taken 3 Klonopin in attempt to obtain relief - denies SI attempt. States she was in [place] x 2 on the mental health - pre-natal unit. States she did not feel this helped her. States she is willing to follow tx advice - inpt or outpt resources. States she is willing to go inpt if there is something new that can be tried. States she has a therapist she sees weekly - last seen was 2 weeks ago and has an appt in 2 days. States she has a psych who we are free to contact for collateral d/t states "I have been on every medication there is and none of it helps". Spouse and friend appear supportive.

## 2018-07-23 NOTE — ED Notes (Signed)
ED Provider at bedside. 

## 2018-08-01 ENCOUNTER — Ambulatory Visit: Payer: PRIVATE HEALTH INSURANCE

## 2018-08-01 ENCOUNTER — Ambulatory Visit (INDEPENDENT_AMBULATORY_CARE_PROVIDER_SITE_OTHER): Payer: PRIVATE HEALTH INSURANCE | Admitting: *Deleted

## 2018-08-01 DIAGNOSIS — Z3042 Encounter for surveillance of injectable contraceptive: Secondary | ICD-10-CM | POA: Diagnosis not present

## 2018-08-01 MED ORDER — MEDROXYPROGESTERONE ACETATE 150 MG/ML IM SUSP
150.0000 mg | Freq: Once | INTRAMUSCULAR | Status: AC
  Administered 2018-08-01: 150 mg via INTRAMUSCULAR

## 2018-08-01 NOTE — Progress Notes (Signed)
Pt in for depo provera. UPT is negative. Depo Provera given IM in left upper out gluteal. Patient tolerated well. Next dose in 12 weeks.

## 2018-08-15 ENCOUNTER — Encounter: Payer: Self-pay | Admitting: Women's Health

## 2018-08-15 ENCOUNTER — Ambulatory Visit (INDEPENDENT_AMBULATORY_CARE_PROVIDER_SITE_OTHER): Payer: PRIVATE HEALTH INSURANCE | Admitting: Women's Health

## 2018-08-15 VITALS — BP 112/72 | HR 87 | Ht 66.0 in | Wt 151.0 lb

## 2018-08-15 DIAGNOSIS — N6320 Unspecified lump in the left breast, unspecified quadrant: Secondary | ICD-10-CM | POA: Diagnosis not present

## 2018-08-15 DIAGNOSIS — F4310 Post-traumatic stress disorder, unspecified: Secondary | ICD-10-CM | POA: Insufficient documentation

## 2018-08-15 NOTE — Patient Instructions (Signed)
Breast ultrasound 12/18 @ 7:30am at the [facility] in [place] ([place], [phone]), be there at 7:10am, no lotion/deoderant/powder/perfume that day

## 2018-08-15 NOTE — Progress Notes (Signed)
   GYN VISIT Patient name: Kathy Zimmerman MRN [id number]  Date of birth: 1991-01-21 Chief Complaint:   Breast Problem  History of Present Illness:   Kathy Zimmerman is a 27 y.o. G2P0202 Caucasian female being seen today for report of dark brown spot on Lt breast, she is unsure of exactly how long it has been there.  No pain/problems. Has never had breast u/s or mammogram.  Leaving early Jan to go to PTSD treatment center for 30+ days  No LMP recorded. Patient has had an injection. The current method of family planning is Depo-Provera injections. Last pap 12/02/14. Results were:  normal Review of Systems:   Pertinent items are noted in HPI Denies fever/chills, dizziness, headaches, visual disturbances, fatigue, shortness of breath, chest pain, abdominal pain, vomiting, abnormal vaginal discharge/itching/odor/irritation, problems with periods, bowel movements, urination, or intercourse unless otherwise stated above.  Pertinent History Reviewed:  Reviewed past medical,surgical, social, obstetrical and family history.  Reviewed problem list, medications and allergies. Physical Assessment:   Vitals:   08/15/18 1001  BP: 112/72  Pulse: 87  Weight: 151 lb (68.5 kg)  Height: 5\' 6"  (1.676 m)  Body mass index is 24.37 kg/m.       Physical Examination:   General appearance: alert, well appearing, and in no distress  Mental status: alert, oriented to person, place, and time  Skin: warm & dry   Cardiovascular: normal heart rate noted  Respiratory: normal respiratory effort, no distress  Breasts - Rt breast normal, Lt breast small 0.5cm hyperpigmented flat spot at app 2 oclock Lt breast w/ vertical linear thickening/mass just above  Abdomen: soft, non-tender   Pelvic: examination not indicated  Extremities: no edema   No results found for this or any previous visit (from the past 24 hour(s)).  Assessment & Plan:  1) Lt breast spot/mass> scheduled diagnostic u/s for 12/18 @ 0730 (be there at  0710) at [facility]  2) Past due for pap>scheduled for 1/2  Meds: No orders of the defined types were placed in this encounter.   Orders Placed This Encounter  Procedures  . US BREAST LTD UNI LEFT INC AXILLA    Return for as scheduled for pap in Jan.  Kimberly R Booker CNM, [facility] 08/15/2018 10:45 AM

## 2018-08-20 ENCOUNTER — Emergency Department (HOSPITAL_COMMUNITY)
Admission: EM | Admit: 2018-08-20 | Discharge: 2018-08-20 | Disposition: A | Discharge status: Home / Self Care | Payer: PRIVATE HEALTH INSURANCE | Attending: Emergency Medicine | Admitting: Emergency Medicine

## 2018-08-20 ENCOUNTER — Telehealth: Payer: Self-pay | Admitting: Women's Health

## 2018-08-20 ENCOUNTER — Encounter (HOSPITAL_COMMUNITY): Payer: Self-pay | Admitting: Emergency Medicine

## 2018-08-20 DIAGNOSIS — J45909 Unspecified asthma, uncomplicated: Secondary | ICD-10-CM | POA: Diagnosis not present

## 2018-08-20 DIAGNOSIS — R0789 Other chest pain: Secondary | ICD-10-CM | POA: Insufficient documentation

## 2018-08-20 DIAGNOSIS — Z79899 Other long term (current) drug therapy: Secondary | ICD-10-CM | POA: Insufficient documentation

## 2018-08-20 DIAGNOSIS — I10 Essential (primary) hypertension: Secondary | ICD-10-CM | POA: Insufficient documentation

## 2018-08-20 DIAGNOSIS — R21 Rash and other nonspecific skin eruption: Secondary | ICD-10-CM | POA: Diagnosis not present

## 2018-08-20 DIAGNOSIS — R079 Chest pain, unspecified: Secondary | ICD-10-CM | POA: Diagnosis present

## 2018-08-20 NOTE — Telephone Encounter (Signed)
Spoke with pt. Pt is having itching under breasts and below nipple. Breasts feel tender. I advised pt to keep appt for 12/18 for mammogram. Didn't think she could be seen sooner anywhere else. Pt voiced understanding. [place]

## 2018-08-20 NOTE — ED Triage Notes (Signed)
Pt reports right sided rib and back pain that started about 2 months ago and worsened today. Unsure if there was any injury. Endorses some nausea.

## 2018-08-20 NOTE — ED Provider Notes (Signed)
[facility] EMERGENCY DEPARTMENT Provider Note   CSN: [id number] Arrival date & time: 08/20/18  1527     History   Chief Complaint Chief Complaint  Patient presents with  . Back Pain    HPI Kathy Zimmerman is a 27 y.o. female.  HPI   She is here for evaluation of right chest discomfort on and off for 2 months, without provocation.  She denies cough, shortness of breath, weakness or dizziness.  She also concerned about a rash beneath her breasts which is been present for several weeks.  Rash is pruritic.  She denies fever, chills, nausea or vomiting.  She saw her gynecologist provider last week who scheduled an ultrasound of her breast to evaluate for mass, secondary to her having a skin discoloration of the left breast.  This discoloration is not related to her rash.  There are no other known modifying factors.   Past Medical History:  Diagnosis Date  . Anxiety   . Asthma   . Depression   . Headache   . IBS (irritable bowel syndrome)   . Panic attack   . Preeclampsia     Patient Active Problem List   Diagnosis Date Noted  . PTSD (post-traumatic stress disorder) 08/15/2018  . GAD (generalized anxiety disorder) 10/25/2017  . Recurrent vertigo 07/12/2017  . Hypertension 05/30/2017  . POTS (postural orthostatic tachycardia syndrome) 05/29/2017  . Bipolar disorder ([place]) 11/01/2016  . New onset of headaches 09/07/2016  . History of 2 cesarean sections 03/20/2016  . Hx of severe preeclampsia 03/20/2016  . Atypical chest pain 05/31/2015  . Anxiety 04/11/2015  . Retinal detachment of left eye with retinal break 08/08/2011    Class: Acute    Past Surgical History:  Procedure Laterality Date  . CESAREAN SECTION N/A 04/10/2015   Procedure: CESAREAN SECTION;  Surgeon: Crawford Givens, MD;  Location: [facility];  Service: Obstetrics;  Laterality: N/A;  . EYE SURGERY     laser  . SCLERAL BUCKLE  08/09/2011   Procedure: SCLERAL BUCKLE;  Surgeon: Clent Demark Rankin;   Location: [facility];  Service: Ophthalmology;  Laterality: Left;  with cryo  . TENDON REPAIR       OB History    Gravida  2   Para  2   Term      Preterm  2   AB      Living  2     SAB      TAB      Ectopic      Multiple  0   Live Births  2            Home Medications    Prior to Admission medications   Medication Sig Start Date End Date Taking? Authorizing Provider  atenolol (TENORMIN) 12.5 mg TABS tablet Take 12.5 mg by mouth.    [provider]  clonazePAM (KLONOPIN) 0.5 MG tablet Take 1 tablet by mouth daily. 10/25/17   [provider]  diphenhydrAMINE (BENADRYL) 25 MG tablet Take 1 tablet (25 mg total) by mouth every 6 (six) hours as needed for itching (Rash). Patient taking differently: Take 50 mg by mouth 2 (two) times daily as needed for itching (Rash).  06/23/17   Muthersbaugh, Jarrett Soho, PA-C  EPINEPHrine (EPIPEN 2-PAK) 0.3 mg/0.3 mL IJ SOAJ injection Inject 0.3 mLs (0.3 mg total) into the muscle once as needed (for severe allergic reaction). CAll 911 immediately if you have to use this medicine 06/23/17   Muthersbaugh, Jarrett Soho,  PA-C  medroxyPROGESTERone (DEPO-PROVERA) 150 MG/ML injection Inject 1 mL (150 mg total) into the muscle every 3 (three) months. 05/03/18   Roma Schanz, CNM  metoprolol succinate (TOPROL XL) 25 MG 24 hr tablet Take 0.5 tablets (12.5 mg total) by mouth daily. Patient not taking: Reported on 08/15/2018 05/31/18   Erma Heritage, PA-C  ondansetron (ZOFRAN ODT) 4 MG disintegrating tablet Take 1 tablet (4 mg total) by mouth every 8 (eight) hours as needed for nausea. Patient not taking: Reported on 08/15/2018 07/13/18   Noemi Chapel, MD  predniSONE (DELTASONE) 20 MG tablet Take 2 tablets (40 mg total) by mouth daily. Patient not taking: Reported on 08/15/2018 07/13/18   Noemi Chapel, MD  PROAIR HFA 108 [phone] Base) MCG/ACT inhaler INHALE 1 TO 2 PUFFS PO Q 4 TO 6 H PRN FOR SOB AND WHEEZING 11/10/16   [provider]     Family History Family History  Problem Relation Age of Onset  . Depression Father   . Depression Sister   . Mental illness Sister   . Depression Maternal Grandmother   . Mental illness Maternal Grandmother   . Thyroid disease Maternal Grandmother   . Heart disease Maternal Grandfather   . Hypertension Maternal Grandfather   . Hyperlipidemia Maternal Grandfather   . CAD Maternal Grandfather   . Thyroid disease Other   . CAD Other     Social History Social History   Tobacco Use  . Smoking status: Never Smoker  . Smokeless tobacco: Never Used  Substance Use Topics  . Alcohol use: No  . Drug use: No     Allergies   Shrimp [shellfish allergy] and Sulfa antibiotics   Review of Systems Review of Systems  All other systems reviewed and are negative.    Physical Exam Updated Vital Signs BP 122/81   Pulse 97   Temp 98.9 F (37.2 C) (Oral)   Resp 17   Ht 5\' 6"  (1.676 m)   Wt 68.5 kg   LMP  (LMP Unknown)   SpO2 100%   BMI 24.37 kg/m   Physical Exam  Constitutional: She is oriented to person, place, and time. She appears well-developed and well-nourished.  HENT:  Head: Normocephalic and atraumatic.  Eyes: Pupils are equal, round, and reactive to light. Conjunctivae and EOM are normal.  Neck: Normal range of motion and phonation normal. Neck supple.  Cardiovascular: Normal rate and regular rhythm.  Pulmonary/Chest: Effort normal and breath sounds normal. She exhibits tenderness (Mild diffuse right lower chest wall tenderness without crepitation or deformity.  There is no associated skin rash..).  Breast exam, with nurse present, no overt breast abnormality.  Skin between breasts and beneath breasts, indistinct rash.  This rash is characterized by skin colored, slightly raised and irregular patches, without erythema, redness or drainage.  The rash appears slightly excoriated.  There are no areas of vesicles.  Musculoskeletal: Normal range of motion. She exhibits  no edema.  Neurological: She is alert and oriented to person, place, and time. She exhibits normal muscle tone.  Skin: Skin is warm and dry.  See breast exam above.  Psychiatric: She has a normal mood and affect. Her behavior is normal. Judgment and thought content normal.  Nursing note and vitals reviewed.    ED Treatments / Results  Labs (all labs ordered are listed, but only abnormal results are displayed) Labs Reviewed - No data to display  EKG None  Radiology No results found.  Procedures Procedures (including critical care  time)  Medications Ordered in ED Medications - No data to display   Initial Impression / Assessment and Plan / ED Course  I have reviewed the triage vital signs and the nursing notes.  Pertinent labs & imaging results that were available during my care of the patient were reviewed by me and considered in my medical decision making (see chart for details).      Patient Vitals for the past 24 hrs:  BP Temp Temp src Pulse Resp SpO2 Height Weight  08/20/18 1541 122/81 98.9 F (37.2 C) Oral 97 17 100 % 5\' 6"  (1.676 m) 68.5 kg    4:09 PM Reevaluation with update and discussion. After initial assessment and treatment, an updated evaluation reveals no change in clinical status.  Findings discussed with patient and her boyfriend, all questions were answered. Daleen Bo   Medical Decision Making: Nonspecific chest wall pain likely muscular.  Doubt pneumonia, rib fracture or thoracic radiculopathy.  Indistinct rash of the anterior chest beneath the breast.  Likely represents allergic dermatitis.   CRITICAL CARE-no Performed by: Daleen Bo   Patient Vitals for the past 24 hrs:  BP Temp Temp src Pulse Resp SpO2 Height Weight  08/20/18 1541 122/81 98.9 F (37.2 C) Oral 97 17 100 % 5\' 6"  (1.676 m) 68.5 kg    Nursing Notes Reviewed/ Care Coordinated Applicable Imaging Reviewed Interpretation of Laboratory Data incorporated into ED  treatment  The patient appears reasonably screened and/or stabilized for discharge and I doubt any other medical condition or other [facility] requiring further screening, evaluation, or treatment in the ED at this time prior to discharge.  Plan: Home Medications-OTC analgesia of choice, hydrocortisone and Lotrimin creams if needed for skin rash.  Continue usual medications.; Home Treatments-heat to affected area of chest wall; return here if the recommended treatment, does not improve the symptoms; Recommended follow up-to be, PRN.   Final Clinical Impressions(s) / ED Diagnoses   Final diagnoses:  Chest wall pain  Rash    ED Discharge Orders    None       Daleen Bo, MD 08/20/18 1609

## 2018-08-20 NOTE — Telephone Encounter (Signed)
Patient called stating that Joelene Millin has placed an order for a Mammo at first it was at [facility] then [facility] but she would like something as soon as possible. Please contact pt

## 2018-08-20 NOTE — ED Notes (Signed)
Pt verbalized understanding of discharge paperwork.

## 2018-08-20 NOTE — Discharge Instructions (Addendum)
The intermittent pain you are having in your chest is likely from muscle.  Muscles are irritated, when you take a deep breath, twist or turn.  To help this discomfort, use heat on the sore area 3 or 4 times a day for 30 minutes.  For pain use ibuprofen 400 mg 3 times a day with meals.  Try to avoid lifting, or bending, to help the discomfort resolved.  For the rash in your chest, make sure that you keep the area as dry as possible.  You can try using hydrocortisone cream, available at the pharmacy, on the shelf.  Use this cream 3 or 4 times a day, to see if it helps the rash.  You can also consider using some Lotrimin cream, also available on the shelf at the pharmacy.  The Lotrimin could help if there is a component of yeast or fungus causing the rash.  Follow-up for the ultrasound of your breast, next week as scheduled.

## 2018-08-20 NOTE — Telephone Encounter (Signed)
LMOVM that she can try hydrocortisone under breasts to help with itching.

## 2018-08-27 ENCOUNTER — Ambulatory Visit
Admission: RE | Admit: 2018-08-27 | Discharge: 2018-08-27 | Disposition: A | Discharge status: Home / Self Care | Payer: PRIVATE HEALTH INSURANCE | Source: Ambulatory Visit | Attending: Women's Health | Admitting: Women's Health

## 2018-08-27 DIAGNOSIS — N6320 Unspecified lump in the left breast, unspecified quadrant: Secondary | ICD-10-CM

## 2018-08-28 ENCOUNTER — Other Ambulatory Visit: Payer: PRIVATE HEALTH INSURANCE

## 2018-09-07 ENCOUNTER — Encounter (HOSPITAL_COMMUNITY): Payer: Self-pay | Admitting: Emergency Medicine

## 2018-09-07 ENCOUNTER — Emergency Department (HOSPITAL_COMMUNITY)
Admission: EM | Admit: 2018-09-07 | Discharge: 2018-09-07 | Disposition: A | Discharge status: Home / Self Care | Payer: PRIVATE HEALTH INSURANCE | Attending: Emergency Medicine | Admitting: Emergency Medicine

## 2018-09-07 ENCOUNTER — Emergency Department (HOSPITAL_COMMUNITY): Payer: PRIVATE HEALTH INSURANCE

## 2018-09-07 ENCOUNTER — Other Ambulatory Visit: Payer: Self-pay

## 2018-09-07 DIAGNOSIS — W2209XA Striking against other stationary object, initial encounter: Secondary | ICD-10-CM | POA: Diagnosis not present

## 2018-09-07 DIAGNOSIS — S61421A Laceration with foreign body of right hand, initial encounter: Secondary | ICD-10-CM | POA: Diagnosis not present

## 2018-09-07 DIAGNOSIS — Y929 Unspecified place or not applicable: Secondary | ICD-10-CM | POA: Diagnosis not present

## 2018-09-07 DIAGNOSIS — S6991XA Unspecified injury of right wrist, hand and finger(s), initial encounter: Secondary | ICD-10-CM | POA: Diagnosis present

## 2018-09-07 DIAGNOSIS — I10 Essential (primary) hypertension: Secondary | ICD-10-CM | POA: Diagnosis not present

## 2018-09-07 DIAGNOSIS — J45909 Unspecified asthma, uncomplicated: Secondary | ICD-10-CM | POA: Diagnosis not present

## 2018-09-07 DIAGNOSIS — Y999 Unspecified external cause status: Secondary | ICD-10-CM | POA: Diagnosis not present

## 2018-09-07 DIAGNOSIS — T148XXA Other injury of unspecified body region, initial encounter: Secondary | ICD-10-CM

## 2018-09-07 DIAGNOSIS — Y9389 Activity, other specified: Secondary | ICD-10-CM | POA: Insufficient documentation

## 2018-09-07 DIAGNOSIS — W25XXXA Contact with sharp glass, initial encounter: Secondary | ICD-10-CM | POA: Insufficient documentation

## 2018-09-07 DIAGNOSIS — Z23 Encounter for immunization: Secondary | ICD-10-CM | POA: Diagnosis not present

## 2018-09-07 MED ORDER — TETANUS-DIPHTH-ACELL PERTUSSIS 5-2.5-18.5 LF-MCG/0.5 IM SUSP
0.5000 mL | Freq: Once | INTRAMUSCULAR | Status: AC
  Administered 2018-09-07: 0.5 mL via INTRAMUSCULAR
  Filled 2018-09-07: qty 0.5

## 2018-09-07 NOTE — ED Notes (Signed)
Pt returned from xray at this time.

## 2018-09-07 NOTE — Discharge Instructions (Addendum)
Keep the wounds clean with mild soap and water.  You may keep them bandaged if needed.  Tylenol if needed for pain.

## 2018-09-07 NOTE — ED Notes (Signed)
ED Provider at bedside. 

## 2018-09-07 NOTE — ED Triage Notes (Signed)
Patient c/o right hand pain with small fragments of glass in hand. Patient states that she punched a glass window approx 1 hour ago. Multiple abrasions to hand. No active bleeding. Patient "believes last tetanus vaccination was 2 years ago."

## 2018-09-09 NOTE — ED Provider Notes (Signed)
[facility] Provider Note   CSN: [id number] Arrival date & time: 09/07/18  1332     History   Chief Complaint Chief Complaint  Patient presents with  . Hand Pain  . Foreign Body in Skin    HPI Kathy Zimmerman is a 27 y.o. female.  HPI  Kathy Zimmerman is a 27 y.o. female who presents to the Emergency Department requesting evaluation for possible glass fragments to her right hand.  She states that approximately 1 hour prior to arrival that she became upset and punched a glass window.  She describes multiple small shards of glass with several small cuts to her hand.  She has tried to clean her hand but she is unsure if she got all the remaining glass.  She is also unsure of her last tetanus vaccine.  She denies numbness, active bleeding, or difficulty moving her hand wrist or fingers.  She denies SI or HI   Past Medical History:  Diagnosis Date  . Anxiety   . Asthma   . Depression   . Headache   . IBS (irritable bowel syndrome)   . Panic attack   . Preeclampsia     Patient Active Problem List   Diagnosis Date Noted  . PTSD (post-traumatic stress disorder) 08/15/2018  . GAD (generalized anxiety disorder) 10/25/2017  . Recurrent vertigo 07/12/2017  . Hypertension 05/30/2017  . POTS (postural orthostatic tachycardia syndrome) 05/29/2017  . Bipolar disorder ([place]) 11/01/2016  . New onset of headaches 09/07/2016  . History of 2 cesarean sections 03/20/2016  . Hx of severe preeclampsia 03/20/2016  . Atypical chest pain 05/31/2015  . Anxiety 04/11/2015  . Retinal detachment of left eye with retinal break 08/08/2011    Class: Acute    Past Surgical History:  Procedure Laterality Date  . CESAREAN SECTION N/A 04/10/2015   Procedure: CESAREAN SECTION;  Surgeon: Crawford Givens, MD;  Location: [facility];  Service: Obstetrics;  Laterality: N/A;  . EYE SURGERY     laser  . SCLERAL BUCKLE  08/09/2011   Procedure: SCLERAL BUCKLE;  Surgeon: Clent Demark Rankin;   Location: [facility];  Service: Ophthalmology;  Laterality: Left;  with cryo  . TENDON REPAIR       OB History    Gravida  2   Para  2   Term      Preterm  2   AB      Living  2     SAB      TAB      Ectopic      Multiple  0   Live Births  2            Home Medications    Prior to Admission medications   Medication Sig Start Date End Date Taking? Authorizing Provider  atenolol (TENORMIN) 12.5 mg TABS tablet Take 12.5 mg by mouth.    [provider]  clonazePAM (KLONOPIN) 0.5 MG tablet Take 1 tablet by mouth daily. 10/25/17   [provider]  diphenhydrAMINE (BENADRYL) 25 MG tablet Take 1 tablet (25 mg total) by mouth every 6 (six) hours as needed for itching (Rash). Patient taking differently: Take 50 mg by mouth 2 (two) times daily as needed for itching (Rash).  06/23/17   Muthersbaugh, Jarrett Soho, PA-C  EPINEPHrine (EPIPEN 2-PAK) 0.3 mg/0.3 mL IJ SOAJ injection Inject 0.3 mLs (0.3 mg total) into the muscle once as needed (for severe allergic reaction). CAll 911 immediately if you have to use  this medicine 06/23/17   Muthersbaugh, Jarrett Soho, PA-C  medroxyPROGESTERone (DEPO-PROVERA) 150 MG/ML injection Inject 1 mL (150 mg total) into the muscle every 3 (three) months. 05/03/18   Roma Schanz, CNM  metoprolol succinate (TOPROL XL) 25 MG 24 hr tablet Take 0.5 tablets (12.5 mg total) by mouth daily. Patient not taking: Reported on 08/15/2018 05/31/18   Erma Heritage, PA-C  ondansetron (ZOFRAN ODT) 4 MG disintegrating tablet Take 1 tablet (4 mg total) by mouth every 8 (eight) hours as needed for nausea. Patient not taking: Reported on 08/15/2018 07/13/18   Noemi Chapel, MD  predniSONE (DELTASONE) 20 MG tablet Take 2 tablets (40 mg total) by mouth daily. Patient not taking: Reported on 08/15/2018 07/13/18   Noemi Chapel, MD  PROAIR HFA 108 [phone] Base) MCG/ACT inhaler INHALE 1 TO 2 PUFFS PO Q 4 TO 6 H PRN FOR SOB AND WHEEZING 11/10/16   [provider]     Family History Family History  Problem Relation Age of Onset  . Depression Father   . Depression Sister   . Mental illness Sister   . Depression Maternal Grandmother   . Mental illness Maternal Grandmother   . Thyroid disease Maternal Grandmother   . Heart disease Maternal Grandfather   . Hypertension Maternal Grandfather   . Hyperlipidemia Maternal Grandfather   . CAD Maternal Grandfather   . Thyroid disease Other   . CAD Other     Social History Social History   Tobacco Use  . Smoking status: Never Smoker  . Smokeless tobacco: Never Used  Substance Use Topics  . Alcohol use: No  . Drug use: No     Allergies   Shrimp [shellfish allergy] and Sulfa antibiotics   Review of Systems Review of Systems  Constitutional: Negative for chills and fever.  Cardiovascular: Negative for chest pain.  Musculoskeletal: Negative for arthralgias, joint swelling and myalgias.  Skin: Positive for wound (Several small cuts to the right hand). Negative for color change and rash.  Neurological: Negative for dizziness, weakness and numbness.  Hematological: Does not bruise/bleed easily.  Psychiatric/Behavioral: Negative for self-injury and suicidal ideas. The patient is not nervous/anxious.      Physical Exam Updated Vital Signs BP 118/75 (BP Location: Right Arm)   Pulse 78   Temp 98.5 F (36.9 C) (Oral)   Ht 5\' 6"  (1.676 m)   Wt 68 kg   LMP  (LMP Unknown)   SpO2 100%   BMI 24.21 kg/m   Physical Exam Vitals signs and nursing note reviewed.  Constitutional:      General: She is not in acute distress.    Appearance: Normal appearance. She is not ill-appearing or toxic-appearing.  HENT:     Head: Atraumatic.  Cardiovascular:     Rate and Rhythm: Normal rate and regular rhythm.     Pulses: Normal pulses.  Pulmonary:     Effort: Pulmonary effort is normal.     Breath sounds: Normal breath sounds.  Musculoskeletal:        General: Signs of injury present. No swelling,  tenderness or deformity.     Right hand: She exhibits laceration. She exhibits normal range of motion, no bony tenderness, normal capillary refill and no swelling. Normal sensation noted. Normal strength noted. She exhibits no finger abduction, no thumb/finger opposition and no wrist extension trouble.       Hands:     Comments: Several superficial less than 1 cm scratches to the dorsal and lateral right hand.  One small shard of glass at the distal right wrist that is superficial.  Injuries do not extend into the subcutaneous layer.  No active bleeding.  She has full range of motion of the right wrist and fingers.  Sensation intact.  Skin:    General: Skin is warm.     Capillary Refill: Capillary refill takes less than 2 seconds.  Neurological:     General: No focal deficit present.     Mental Status: She is alert.     Sensory: No sensory deficit.     Motor: No weakness.  Psychiatric:        Attention and Perception: Attention normal.        Mood and Affect: Mood normal.        Speech: Speech normal.        Behavior: Behavior normal.        Thought Content: Thought content normal. Thought content does not include homicidal or suicidal ideation. Thought content does not include suicidal plan.      ED Treatments / Results  Labs (all labs ordered are listed, but only abnormal results are displayed) Labs Reviewed - No data to display  EKG None  Radiology No results found.  Procedures Procedures (including critical care time)  Medications Ordered in ED Medications  Tdap (BOOSTRIX) injection 0.5 mL (0.5 mLs Intramuscular Given 09/07/18 1505)     Initial Impression / Assessment and Plan / ED Course  I have reviewed the triage vital signs and the nursing notes.  Pertinent labs & imaging results that were available during my care of the patient were reviewed by me and considered in my medical decision making (see chart for details).     Patient with superficial scratches of  the right hand.  No deep wounds or one small shard of glass removed that was superficial..  Neurovascularly intact.  No active bleeding.  TD updated.  Wounds were cleaned by me with saline and explored.  No motor weakness or deficits.  Final Clinical Impressions(s) / ED Diagnoses   Final diagnoses:  Foreign body in skin    ED Discharge Orders    None       Kem Parkinson, PA-C 09/09/18 1746    Fredia Sorrow, MD 09/11/18 [phone]

## 2018-09-12 ENCOUNTER — Telehealth: Payer: Self-pay | Admitting: Women's Health

## 2018-09-12 ENCOUNTER — Other Ambulatory Visit: Payer: PRIVATE HEALTH INSURANCE | Admitting: Women's Health

## 2018-09-12 NOTE — Telephone Encounter (Signed)
Called patient because she missed her appointment today.  She stated that she tried to call us yesterday to cancel, but we were closed.  She stated to let Maudie Mercury know that she is going for treatment and that Maudie Mercury would know what that means.  She will call back to reschedule her pap/physical later.  [phone]

## 2018-10-24 ENCOUNTER — Ambulatory Visit: Payer: PRIVATE HEALTH INSURANCE

## 2018-11-28 ENCOUNTER — Encounter (HOSPITAL_COMMUNITY): Payer: Self-pay | Admitting: Emergency Medicine

## 2018-11-28 ENCOUNTER — Emergency Department (HOSPITAL_COMMUNITY)
Admission: EM | Admit: 2018-11-28 | Discharge: 2018-11-29 | Disposition: A | Discharge status: Home / Self Care | Payer: PRIVATE HEALTH INSURANCE | Attending: Emergency Medicine | Admitting: Emergency Medicine

## 2018-11-28 ENCOUNTER — Other Ambulatory Visit: Payer: Self-pay

## 2018-11-28 DIAGNOSIS — Z79899 Other long term (current) drug therapy: Secondary | ICD-10-CM | POA: Diagnosis not present

## 2018-11-28 DIAGNOSIS — R45851 Suicidal ideations: Secondary | ICD-10-CM | POA: Diagnosis not present

## 2018-11-28 DIAGNOSIS — F4310 Post-traumatic stress disorder, unspecified: Secondary | ICD-10-CM | POA: Diagnosis present

## 2018-11-28 DIAGNOSIS — F411 Generalized anxiety disorder: Secondary | ICD-10-CM | POA: Diagnosis not present

## 2018-11-28 DIAGNOSIS — T424X2A Poisoning by benzodiazepines, intentional self-harm, initial encounter: Secondary | ICD-10-CM | POA: Diagnosis not present

## 2018-11-28 DIAGNOSIS — F333 Major depressive disorder, recurrent, severe with psychotic symptoms: Secondary | ICD-10-CM | POA: Diagnosis present

## 2018-11-28 DIAGNOSIS — J45909 Unspecified asthma, uncomplicated: Secondary | ICD-10-CM | POA: Diagnosis not present

## 2018-11-28 DIAGNOSIS — F419 Anxiety disorder, unspecified: Secondary | ICD-10-CM | POA: Diagnosis present

## 2018-11-28 DIAGNOSIS — I10 Essential (primary) hypertension: Secondary | ICD-10-CM | POA: Insufficient documentation

## 2018-11-28 DIAGNOSIS — T50902A Poisoning by unspecified drugs, medicaments and biological substances, intentional self-harm, initial encounter: Secondary | ICD-10-CM

## 2018-11-28 LAB — COMPREHENSIVE METABOLIC PANEL
ALT: 16 U/L (ref 0–44)
AST: 17 U/L (ref 15–41)
Albumin: 4.1 g/dL (ref 3.5–5.0)
Alkaline Phosphatase: 55 U/L (ref 38–126)
Anion gap: 9 (ref 5–15)
BUN: 10 mg/dL (ref 6–20)
CO2: 22 mmol/L (ref 22–32)
Calcium: 9 mg/dL (ref 8.9–10.3)
Chloride: 109 mmol/L (ref 98–111)
Creatinine, Ser: 0.74 mg/dL (ref 0.44–1.00)
GFR calc Af Amer: >60 mL/min (ref >60)
GFR calc non Af Amer: >60 mL/min (ref >60)
Glucose, Bld: 87 mg/dL (ref 70–99)
Potassium: 3.8 mmol/L (ref 3.5–5.1)
Sodium: 140 mmol/L (ref 135–145)
Total Bilirubin: 0.4 mg/dL (ref 0.3–1.2)
Total Protein: 7.5 g/dL (ref 6.5–8.1)

## 2018-11-28 LAB — CBC WITH DIFFERENTIAL/PLATELET
Abs Immature Granulocytes: 0.02 10*3/uL (ref 0.00–0.07)
Basophils Absolute: 0.1 10*3/uL (ref 0.0–0.1)
Basophils Relative: 1 %
Eosinophils Absolute: 0.1 10*3/uL (ref 0.0–0.5)
Eosinophils Relative: 1 %
HCT: 39.4 % (ref 36.0–46.0)
Hemoglobin: 13.2 g/dL (ref 12.0–15.0)
Immature Granulocytes: 0 %
Lymphocytes Relative: 41 %
Lymphs Abs: 3.1 10*3/uL (ref 0.7–4.0)
MCH: 30.3 pg (ref 26.0–34.0)
MCHC: 33.5 g/dL (ref 30.0–36.0)
MCV: 90.6 fL (ref 80.0–100.0)
Monocytes Absolute: 0.6 10*3/uL (ref 0.1–1.0)
Monocytes Relative: 9 %
Neutro Abs: 3.6 10*3/uL (ref 1.7–7.7)
Neutrophils Relative %: 48 %
Platelets: 239 10*3/uL (ref 150–400)
RBC: 4.35 MIL/uL (ref 3.87–5.11)
RDW: 12.5 % (ref 11.5–15.5)
WBC: 7.4 10*3/uL (ref 4.0–10.5)
nRBC: 0 % (ref 0.0–0.2)

## 2018-11-28 LAB — ACETAMINOPHEN LEVEL: Acetaminophen (Tylenol), Serum: <10 ug/mL — ABNORMAL LOW (ref 10–30)

## 2018-11-28 LAB — I-STAT BETA HCG BLOOD, ED (MC, WL, AP ONLY): I-stat hCG, quantitative: <5 m[IU]/mL (ref <5)

## 2018-11-28 LAB — ETHANOL: Alcohol, Ethyl (B): <10 mg/dL (ref <10)

## 2018-11-28 LAB — SALICYLATE LEVEL: Salicylate Lvl: <7 mg/dL (ref 2.8–30.0)

## 2018-11-28 MED ORDER — ALUM & MAG HYDROXIDE-SIMETH 200-200-20 MG/5ML PO SUSP: 30.0000 mL | Freq: Four times a day (QID) | ORAL | Status: DC | PRN

## 2018-11-28 MED ORDER — IBUPROFEN 200 MG PO TABS: 600.0000 mg | ORAL_TABLET | Freq: Three times a day (TID) | ORAL | Status: DC | PRN

## 2018-11-28 MED ORDER — ONDANSETRON HCL 4 MG PO TABS: 4.0000 mg | ORAL_TABLET | Freq: Three times a day (TID) | ORAL | Status: DC | PRN

## 2018-11-28 NOTE — ED Notes (Signed)
Bed: WA09 Expected date:  Expected time:  Means of arrival:  Comments: 28 yo F/ OD 12 Klonopin

## 2018-11-28 NOTE — ED Provider Notes (Signed)
[place] DEPT Provider Note   CSN: [id number] Arrival date & time: 11/28/18  2156    History   Chief Complaint Chief Complaint  Patient presents with  . Drug Overdose    HPI Kathy Zimmerman is a 28 y.o. female.     The history is provided by the patient. No language interpreter was used.  Drug Overdose    Kathy Zimmerman is a 28 y.o. female who presents to the Emergency Department complaining of overdose. She presents to the emergency department via EMS for evaluation following an overdose. She was recently discharged from [facility] after admission for psychiatric illness. She has been feeling overwhelmed at the air B&B that she is staying at. She is feeling stressed over not knowing where to live in currently getting separated from her husband. She does not desire to die but she does not want to feel the way she feels. She took six, 0.5 mg Klonopin about four hours ago and then took an additional six 0.5 mg Klonopin one hour prior to ED arrival. She states that she feels drug. She texted a friend from therapy about the way that she was feeling and he contacted 911. Symptoms are severe and constant in nature. Past Medical History:  Diagnosis Date  . Anxiety   . Asthma   . Depression   . Headache   . IBS (irritable bowel syndrome)   . Panic attack   . Preeclampsia     Patient Active Problem List   Diagnosis Date Noted  . PTSD (post-traumatic stress disorder) 08/15/2018  . GAD (generalized anxiety disorder) 10/25/2017  . Recurrent vertigo 07/12/2017  . Hypertension 05/30/2017  . POTS (postural orthostatic tachycardia syndrome) 05/29/2017  . Bipolar disorder ([place]) 11/01/2016  . New onset of headaches 09/07/2016  . History of 2 cesarean sections 03/20/2016  . Hx of severe preeclampsia 03/20/2016  . Atypical chest pain 05/31/2015  . Anxiety 04/11/2015  . Retinal detachment of left eye with retinal break 08/08/2011    Class: Acute     Past Surgical History:  Procedure Laterality Date  . CESAREAN SECTION N/A 04/10/2015   Procedure: CESAREAN SECTION;  Surgeon: Crawford Givens, MD;  Location: [facility];  Service: Obstetrics;  Laterality: N/A;  . EYE SURGERY     laser  . SCLERAL BUCKLE  08/09/2011   Procedure: SCLERAL BUCKLE;  Surgeon: Clent Demark Rankin;  Location: [facility];  Service: Ophthalmology;  Laterality: Left;  with cryo  . TENDON REPAIR       OB History    Gravida  2   Para  2   Term      Preterm  2   AB      Living  2     SAB      TAB      Ectopic      Multiple  0   Live Births  2            Home Medications    Prior to Admission medications   Medication Sig Start Date End Date Taking? Authorizing Provider  atenolol (TENORMIN) 12.5 mg TABS tablet Take 12.5 mg by mouth daily.    Yes [provider]  clonazePAM (KLONOPIN) 0.5 MG tablet Take 0.5 mg by mouth 3 (three) times daily as needed for anxiety.  10/25/17  Yes [provider]  diphenhydrAMINE (BENADRYL) 25 MG tablet Take 1 tablet (25 mg total) by mouth every 6 (six) hours as needed for itching (Rash).  Patient taking differently: Take 50 mg by mouth 2 (two) times daily as needed for itching (Rash).  06/23/17  Yes Muthersbaugh, Jarrett Soho, PA-C  lithium 600 MG capsule Take 600 mg by mouth daily.   Yes [provider]  medroxyPROGESTERone (DEPO-PROVERA) 150 MG/ML injection Inject 150 mg into the muscle every 3 (three) months.  05/03/18  Yes [provider]  PROAIR HFA 108 (90 Base) MCG/ACT inhaler Inhale 2 puffs into the lungs every 4 (four) hours as needed for wheezing.  11/10/16  Yes [provider]  sertraline (ZOLOFT) 100 MG tablet Take 100 mg by mouth daily.   Yes [provider]  EPINEPHrine (EPIPEN 2-PAK) 0.3 mg/0.3 mL IJ SOAJ injection Inject 0.3 mLs (0.3 mg total) into the muscle once as needed (for severe allergic reaction). CAll 911 immediately if you have to use this medicine 06/23/17    Muthersbaugh, Jarrett Soho, PA-C  medroxyPROGESTERone (DEPO-PROVERA) 150 MG/ML injection Inject 1 mL (150 mg total) into the muscle every 3 (three) months. Patient not taking: Reported on 11/28/2018 05/03/18   Roma Schanz, CNM  metoprolol succinate (TOPROL XL) 25 MG 24 hr tablet Take 0.5 tablets (12.5 mg total) by mouth daily. Patient not taking: Reported on 08/15/2018 05/31/18   Erma Heritage, PA-C  ondansetron (ZOFRAN ODT) 4 MG disintegrating tablet Take 1 tablet (4 mg total) by mouth every 8 (eight) hours as needed for nausea. Patient not taking: Reported on 08/15/2018 07/13/18   Noemi Chapel, MD  predniSONE (DELTASONE) 20 MG tablet Take 2 tablets (40 mg total) by mouth daily. Patient not taking: Reported on 08/15/2018 07/13/18   Noemi Chapel, MD    Family History Family History  Problem Relation Age of Onset  . Depression Father   . Depression Sister   . Mental illness Sister   . Depression Maternal Grandmother   . Mental illness Maternal Grandmother   . Thyroid disease Maternal Grandmother   . Heart disease Maternal Grandfather   . Hypertension Maternal Grandfather   . Hyperlipidemia Maternal Grandfather   . CAD Maternal Grandfather   . Thyroid disease Other   . CAD Other     Social History Social History   Tobacco Use  . Smoking status: Never Smoker  . Smokeless tobacco: Never Used  Substance Use Topics  . Alcohol use: No  . Drug use: No     Allergies   Cat hair extract; Shrimp [shellfish allergy]; and Sulfa antibiotics   Review of Systems Review of Systems  All other systems reviewed and are negative.    Physical Exam Updated Vital Signs BP 113/78   Pulse 71   Resp (!) 25   SpO2 100%   Physical Exam Vitals signs and nursing note reviewed.  Constitutional:      Appearance: She is well-developed.  HENT:     Head: Normocephalic and atraumatic.  Cardiovascular:     Rate and Rhythm: Normal rate and regular rhythm.     Heart sounds: No murmur.   Pulmonary:     Effort: Pulmonary effort is normal. No respiratory distress.     Breath sounds: Normal breath sounds.  Abdominal:     Palpations: Abdomen is soft.     Tenderness: There is no abdominal tenderness. There is no guarding or rebound.  Musculoskeletal:        General: No swelling or tenderness.  Skin:    General: Skin is warm and dry.  Neurological:     Mental Status: She is alert and oriented to  person, place, and time.  Psychiatric:        Behavior: Behavior normal.     Comments: Flat affect      ED Treatments / Results  Labs (all labs ordered are listed, but only abnormal results are displayed) Labs Reviewed  ACETAMINOPHEN LEVEL - Abnormal; Notable for the following components:      Result Value   Acetaminophen (Tylenol), Serum <10 (*)    All other components within normal limits  COMPREHENSIVE METABOLIC PANEL  CBC WITH DIFFERENTIAL/PLATELET  SALICYLATE LEVEL  ETHANOL  RAPID URINE DRUG SCREEN, HOSP PERFORMED  LITHIUM LEVEL  I-STAT BETA HCG BLOOD, ED ([facility], [facility], AP ONLY)    EKG EKG Interpretation  Date/Time:  Thursday November 28 2018 22:22:57 EDT Ventricular Rate:  69 PR Interval:    QRS Duration: 86 QT Interval:  372 QTC Calculation: 399 R Axis:   70 Text Interpretation:  Sinus rhythm Confirmed by Quintella Reichert [phone]) on 11/28/2018 10:38:22 PM   Radiology No results found.  Procedures Procedures (including critical care time)  Medications Ordered in ED Medications  ibuprofen (ADVIL,MOTRIN) tablet 600 mg (has no administration in time range)  ondansetron (ZOFRAN) tablet 4 mg (has no administration in time range)  alum & mag hydroxide-simeth (MAALOX/MYLANTA) 200-200-20 MG/5ML suspension 30 mL (has no administration in time range)     Initial Impression / Assessment and Plan / ED Course  I have reviewed the triage vital signs and the nursing notes.  Pertinent labs & imaging results that were available during my care of the patient were  reviewed by me and considered in my medical decision making (see chart for details).        Patient here for evaluation following intentional overdose on Klonopin. She is awake and alert on examination. EKG without acute ischemic changes. Patient care transferred pending lithium level. Plan for psychiatric evaluation given her overdose.    Final Clinical Impressions(s) / ED Diagnoses   Final diagnoses:  None    ED Discharge Orders    None       Quintella Reichert, MD 11/29/18 0009

## 2018-11-28 NOTE — ED Triage Notes (Signed)
Pt arriving via [facility] after taking 6mg  Klonopin. Pt states she took the medications to "kill the mental pain". Pt A&O x4 at this time and ambulatory.

## 2018-11-29 DIAGNOSIS — F411 Generalized anxiety disorder: Secondary | ICD-10-CM

## 2018-11-29 LAB — RAPID URINE DRUG SCREEN, HOSP PERFORMED
Amphetamines: NOT DETECTED
Barbiturates: NOT DETECTED
Benzodiazepines: POSITIVE — AB
Cocaine: NOT DETECTED
Opiates: NOT DETECTED
Tetrahydrocannabinol: NOT DETECTED

## 2018-11-29 LAB — LITHIUM LEVEL: Lithium Lvl: 0.15 mmol/L — ABNORMAL LOW (ref 0.60–1.20)

## 2018-11-29 MED ORDER — ATENOLOL 25 MG PO TABS
12.5000 mg | ORAL_TABLET | Freq: Every day | ORAL | Status: DC
  Administered 2018-11-29: 12.5 mg via ORAL
  Filled 2018-11-29: qty 0.5

## 2018-11-29 MED ORDER — DIPHENHYDRAMINE HCL 25 MG PO CAPS: 25.0000 mg | ORAL_CAPSULE | Freq: Four times a day (QID) | ORAL | Status: DC | PRN

## 2018-11-29 MED ORDER — SERTRALINE HCL 50 MG PO TABS
100.0000 mg | ORAL_TABLET | Freq: Every day | ORAL | Status: DC
  Administered 2018-11-29: 100 mg via ORAL
  Filled 2018-11-29: qty 2

## 2018-11-29 MED ORDER — ALBUTEROL SULFATE HFA 108 (90 BASE) MCG/ACT IN AERS: 2.0000 | INHALATION_SPRAY | RESPIRATORY_TRACT | Status: DC | PRN

## 2018-11-29 MED ORDER — LITHIUM CARBONATE 300 MG PO CAPS: 600.0000 mg | ORAL_CAPSULE | Freq: Every day | ORAL | Status: DC

## 2018-11-29 MED ORDER — CLONAZEPAM 0.5 MG PO TABS: 0.5000 mg | ORAL_TABLET | Freq: Three times a day (TID) | ORAL | Status: DC | PRN

## 2018-11-29 NOTE — ED Notes (Signed)
Pt talking on hallway phone.  

## 2018-11-29 NOTE — BH Assessment (Signed)
[facility] Assessment Progress Note  Per Buford Dresser, DO, this pt does not require psychiatric hospitalization at this time.  Pt presents under IVC initiated by EDP Quintella Reichert, MD, which Dr Mariea Clonts has rescinded.  Pt has a previously scheduled appointment to start the [facility] at the [facility] on Monday, 12/09/2018 at 08:30.  This has been included in pt's discharge instructions.  Pt has been provided with printed information about the program, along with registration paperwork, which she has been advised to fill out at home and take with her for the appointment.  Pt's nurse, Caryl Pina, has been notified.  Jalene Mullet, [place] Triage Specialist [phone]

## 2018-11-29 NOTE — Progress Notes (Signed)
Per Patriciaann Clan, PA pt meets criteria for inpt treatment. EDP Dr. Leonides Schanz, MD has been advised. Per [facility], RN pt is being reviewed by [facility] for possible admission.   Lind Covert, MSW, LCSW Therapeutic Triage Specialist  [phone]

## 2018-11-29 NOTE — ED Notes (Signed)
Bed: WBH34 Expected date:  Expected time:  Means of arrival:  Comments: 

## 2018-11-29 NOTE — ED Notes (Signed)
Pt d/c home per MD order. Pt reports ride is here. Discharge summary reviewed with pt. Pt verbalizes understanding. Denies SI/HI/AVH. Pt home medication picked up from pharmacy lock up, pt signed for medication. Valuables locked with security picked up , pt signed. All personal property returned to pt. Pt signed e-signature. Ambulatory off unit with MHT.

## 2018-11-29 NOTE — ED Provider Notes (Signed)
12:40 AM  TTS recommends inpatient treatment and patient refuses.  IVC papers were completed by Dr. Ralene Bathe and have now been submitted to magistrate.  1:00 AM  Pt's lithium level is 0.15.  She is medically cleared.  I have reordered her home medications.  Awaiting psychiatric placement.     Chanelle Hodsdon, Delice Bison, DO 11/29/18 [phone]

## 2018-11-29 NOTE — Progress Notes (Signed)
Per Gregary Cromer, RN pt is not appropriate for 400 hall bed and [facility] states pt will need a 500 hall bed due to history. [facility] is currently at capacity for 500 hall beds per [facility]. Ria Comment, RN has been advised.   Lind Covert, MSW, LCSW Therapeutic Triage Specialist  [phone]

## 2018-11-29 NOTE — Consult Note (Addendum)
[facility] Psych ED Discharge  11/29/2018 10:29 AM Kathy Zimmerman  MRN:  [id number] Principal Problem: GAD (generalized anxiety disorder) Discharge Diagnoses: Principal Problem:   GAD (generalized anxiety disorder) Active Problems:   Anxiety   PTSD (post-traumatic stress disorder)   Subjective: Pt was seen and chart reviewed with treatment team and Dr Mariea Clonts. Pt denies suicidal/homicidal ideation, denies auditory/visual hallucinations and does not appear to be responding to internal stimuli. Pt stated she took extra klonopin (6 mg instead of the prescribed 1.5) in order to just get some sleep because she was so anxious. She has a history of PTSD, GAD, MDD and Panic Disorder. Pt's UDS positive for benzos, BAL negative. She just completed 3 months of PHP in [place] and in January was inpatient for 5 days. Her husband and 2 children(ages 2&3) live in [place]. She is living in an AirBnB in [place] because she can not go back to [place] due to memories of trauma that she experienced in [place]. She has a safety plan in place and her friend Joellen Jersey [phone], can come and stay with her if she is discharged. Joellen Jersey was contacted by TTS counselor, Ernst Breach. Joellen Jersey is agreeable and will stay with patient. She has an appointment with [facility] on March 30. She is able to contract for safety outside the hospital. Pt is calm and cooperative, pleasant on approach and is taking her PO medications without incident. Pt is psychiatrically clear.   Total Time spent with patient: 30 minutes  Past Psychiatric History: As above  Past Medical History:  Past Medical History:  Diagnosis Date  . Anxiety   . Asthma   . Depression   . Headache   . IBS (irritable bowel syndrome)   . Panic attack   . Preeclampsia     Past Surgical History:  Procedure Laterality Date  . CESAREAN SECTION N/A 04/10/2015   Procedure: CESAREAN SECTION;  Surgeon: Crawford Givens, MD;  Location: [facility];  Service:  Obstetrics;  Laterality: N/A;  . EYE SURGERY     laser  . SCLERAL BUCKLE  08/09/2011   Procedure: SCLERAL BUCKLE;  Surgeon: Clent Demark Rankin;  Location: [facility];  Service: Ophthalmology;  Laterality: Left;  with cryo  . TENDON REPAIR     Family History:  Family History  Problem Relation Age of Onset  . Depression Father   . Depression Sister   . Mental illness Sister   . Depression Maternal Grandmother   . Mental illness Maternal Grandmother   . Thyroid disease Maternal Grandmother   . Heart disease Maternal Grandfather   . Hypertension Maternal Grandfather   . Hyperlipidemia Maternal Grandfather   . CAD Maternal Grandfather   . Thyroid disease Other   . CAD Other    Family Psychiatric  History: MGM - Bipolar, Father - Depression, Sister - Borderline Personality Disorder Social History:  Social History   Substance and Sexual Activity  Alcohol Use No     Social History   Substance and Sexual Activity  Drug Use No    Social History   Socioeconomic History  . Marital status: Married    Spouse name: Not on file  . Number of children: Not on file  . Years of education: Not on file  . Highest education level: Not on file  Occupational History  . Not on file  Social Needs  . Financial resource strain: Not on file  . Food insecurity:    Worry: Not on file    Inability:  Not on file  . Transportation needs:    Medical: Not on file    Non-medical: Not on file  Tobacco Use  . Smoking status: Never Smoker  . Smokeless tobacco: Never Used  Substance and Sexual Activity  . Alcohol use: No  . Drug use: No  . Sexual activity: Not Currently    Birth control/protection: None  Lifestyle  . Physical activity:    Days per week: Not on file    Minutes per session: Not on file  . Stress: Not on file  Relationships  . Social connections:    Talks on phone: Not on file    Gets together: Not on file    Attends religious service: Not on file    Active member of club or  organization: Not on file    Attends meetings of clubs or organizations: Not on file    Relationship status: Not on file  Other Topics Concern  . Not on file  Social History Narrative  . Not on file    Has this patient used any form of tobacco in the last 30 days? (Cigarettes, Smokeless Tobacco, Cigars, and/or Pipes) Prescription not provided because: Pt does not use tobacco  Current Medications: Current Facility-Administered Medications  Medication Dose Route Frequency Provider Last Rate Last Dose  . albuterol (PROVENTIL HFA;VENTOLIN HFA) 108 (90 Base) MCG/ACT inhaler 2 puff  2 puff Inhalation Q4H PRN Ward, Kristen N, DO      . alum & mag hydroxide-simeth (MAALOX/MYLANTA) 200-200-20 MG/5ML suspension 30 mL  30 mL Oral Q6H PRN Quintella Reichert, MD      . atenolol (TENORMIN) tablet 12.5 mg  12.5 mg Oral Daily Ward, Kristen N, DO      . clonazePAM (KLONOPIN) tablet 0.5 mg  0.5 mg Oral TID PRN Ward, Kristen N, DO      . diphenhydrAMINE (BENADRYL) capsule 25 mg  25 mg Oral Q6H PRN Ward, Kristen N, DO      . ibuprofen (ADVIL,MOTRIN) tablet 600 mg  600 mg Oral Q8H PRN Quintella Reichert, MD      . lithium carbonate capsule 600 mg  600 mg Oral Daily Ward, Kristen N, DO      . ondansetron [facility]) tablet 4 mg  4 mg Oral Q8H PRN Quintella Reichert, MD      . sertraline (ZOLOFT) tablet 100 mg  100 mg Oral Daily Ward, Kristen N, DO       Current Outpatient Medications  Medication Sig Dispense Refill  . atenolol (TENORMIN) 12.5 mg TABS tablet Take 12.5 mg by mouth daily.     . clonazePAM (KLONOPIN) 0.5 MG tablet Take 0.5 mg by mouth 3 (three) times daily as needed for anxiety.     . diphenhydrAMINE (BENADRYL) 25 MG tablet Take 1 tablet (25 mg total) by mouth every 6 (six) hours as needed for itching (Rash). (Patient taking differently: Take 50 mg by mouth 2 (two) times daily as needed for itching (Rash). ) 30 tablet 0  . lithium 600 MG capsule Take 600 mg by mouth daily.    . medroxyPROGESTERone  (DEPO-PROVERA) 150 MG/ML injection Inject 150 mg into the muscle every 3 (three) months.     Marland Kitchen PROAIR HFA 108 (90 Base) MCG/ACT inhaler Inhale 2 puffs into the lungs every 4 (four) hours as needed for wheezing.   3  . sertraline (ZOLOFT) 100 MG tablet Take 100 mg by mouth daily.    Marland Kitchen EPINEPHrine (EPIPEN 2-PAK) 0.3 mg/0.3 mL IJ SOAJ injection Inject  0.3 mLs (0.3 mg total) into the muscle once as needed (for severe allergic reaction). CAll 911 immediately if you have to use this medicine 1 Device 2     Musculoskeletal: Strength & Muscle Tone: within normal limits Gait & Station: not tested Patient leans: N/A  Psychiatric Specialty Exam: Physical Exam  Nursing note and vitals reviewed. Constitutional: She is oriented to person, place, and time. She appears well-developed and well-nourished.  HENT:  Head: Normocephalic and atraumatic.  Neck: Normal range of motion.  Respiratory: Effort normal.  Musculoskeletal: Normal range of motion.  Neurological: She is alert and oriented to person, place, and time.  Psychiatric: Her speech is normal and behavior is normal. Judgment and thought content normal. Cognition and memory are normal. She exhibits a depressed mood.    Review of Systems  Psychiatric/Behavioral: Positive for depression. The patient is nervous/anxious.   All other systems reviewed and are negative.   Blood pressure 110/71, pulse 73, temperature 98.2 F (36.8 C), temperature source Oral, resp. rate 18, SpO2 96 %.There is no height or weight on file to calculate BMI.  General Appearance: Casual  Eye Contact:  Good  Speech:  Clear and Coherent and Normal Rate  Volume:  Normal  Mood:  Anxious and Depressed  Affect:  Congruent and Depressed  Thought Process:  Coherent, Goal Directed, Linear and Descriptions of Associations: Intact  Orientation:  Full (Time, Place, and Person)  Thought Content:  Logical  Suicidal Thoughts:  No  Homicidal Thoughts:  No  Memory:  Immediate;    Good Recent;   Good Remote;   Fair  Judgement:  Good  Insight:  Good  Psychomotor Activity:  Normal  Concentration:  Concentration: Good and Attention Span: Good  Recall:  Good  Fund of Knowledge:  Good  Language:  Good  Akathisia:  No  Handed:  Right  AIMS (if indicated):   N/A  Assets:  Agricultural consultant Housing Social Support Transportation  ADL's:  Intact  Cognition:  WNL  Sleep:   N/A     Demographic Factors:  Adolescent or young adult, Caucasian, Living alone and Unemployed  Loss Factors: Financial problems/change in socioeconomic status  Historical Factors: Family history of mental illness or substance abuse  Risk Reduction Factors:   Responsible for children under 77 years of age and Sense of responsibility to family  Continued Clinical Symptoms:  Severe Anxiety and/or Agitation Panic Attacks Depression:   Anhedonia  Cognitive Features That Contribute To Risk:  Closed-mindedness    Suicide Risk:  Minimal: No identifiable suicidal ideation.  Patients presenting with no risk factors but with morbid ruminations; may be classified as minimal risk based on the severity of the depressive symptoms   Plan Of Care/Follow-up recommendations:  Activity:  as tolerated Diet:  Heart Healthy  Disposition and Treatment Plan:   GAD (generalized anxiety disorder)  Take all medications as prescribed by your outpatient provider. Keep all follow-up appointments as scheduled, IOP at [facility] on December 09, 2018.  Do not consume alcohol or use illegal drugs while on prescription medications. Report any adverse effects from your medications to your primary care provider promptly.  In the event of recurrent symptoms or worsening symptoms, call 911, a crisis hotline, or go to the nearest emergency department for evaluation.   Ethelene Hal, NP 11/29/2018, 10:29 AM   Patient seen face-to-face for psychiatric evaluation, chart  reviewed and case discussed with the physician extender and developed treatment plan. Reviewed the information documented  and agree with the treatment plan.  Buford Dresser, DO 11/29/18 11:25 AM

## 2018-11-29 NOTE — BH Assessment (Signed)
[place] Assessment Progress Note This Probation officer spoke with patient's friend Arville Lime [phone] with patient's permission in reference to rendering collateral information regarding patient's discharge. Tanner stated she would stay with patient (overnight) and provide social support. Tanner also stated that she would assist in providing transportation upon discharge if patient was in need. Patient will be informed of the above with discharge scheduled as planned this date. Cheree Ditto FNP

## 2018-11-29 NOTE — Progress Notes (Signed)
Received Kathy Zimmerman from the main ED,crying and very distraught. She changed from her street clothes into scrubs and wanded with the assistance of the ED nurse and security. She was given water to drink and allowed to call her family for 5 minutes. Later she wanted to know why was she here at the hospital. The MHT, Lanny Hurst, talked with her and addressed all of her concerns. She is resting quietly in bed with the bright lights on. She continued to sleep throughout the remainder of the night without incident.
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
## 2018-11-29 NOTE — Discharge Instructions (Signed)
For your behavioral health needs, you are advised to follow up with the Mental Health Intensive Outpatient Program (MH-IOP) at the [facility] at [place].  This program meets Monday - Friday, from 9:00 am - 12:00 pm.  You are scheduled for an intake appointment on Monday, December 09, 2018 at 8:30 am.  If you have any questions, contact Dellia Nims, MEd at the phone number indicated below:       [facility]. [address] [place], [place] [address]      Contact person: Dellia Nims, MEd      [phone]

## 2018-12-11 ENCOUNTER — Other Ambulatory Visit: Payer: Self-pay | Admitting: Women's Health

## 2018-12-11 MED ORDER — MEGESTROL ACETATE 40 MG PO TABS: ORAL_TABLET | ORAL | 1 refills | Status: DC

## 2019-01-06 ENCOUNTER — Telehealth: Payer: Self-pay | Admitting: Cardiovascular Disease

## 2019-01-06 NOTE — Telephone Encounter (Signed)
Virtual Visit Pre-Appointment Phone Call  "(Name), I am calling you today to discuss your upcoming appointment. We are currently trying to limit exposure to the virus that causes COVID-19 by seeing patients at home rather than in the office."  1. "What is the BEST phone number to call the day of the visit?" - include this in appointment notes  2. Do you have or have access to (through a family member/friend) a smartphone with video capability that we can use for your visit?" a. If yes - list this number in appt notes as cell (if different from BEST phone #) and list the appointment type as a VIDEO visit in appointment notes b. If no - list the appointment type as a PHONE visit in appointment notes  3. Confirm consent - "In the setting of the current Covid19 crisis, you are scheduled for a (phone or video) visit with your provider on (date) at (time).  Just as we do with many in-office visits, in order for you to participate in this visit, we must obtain consent.  If you'd like, I can send this to your mychart (if signed up) or email for you to review.  Otherwise, I can obtain your verbal consent now.  All virtual visits are billed to your insurance company just like a normal visit would be.  By agreeing to a virtual visit, we'd like you to understand that the technology does not allow for your provider to perform an examination, and thus may limit your provider's ability to fully assess your condition. If your provider identifies any concerns that need to be evaluated in person, we will make arrangements to do so.  Finally, though the technology is pretty good, we cannot assure that it will always work on either your or our end, and in the setting of a video visit, we may have to convert it to a phone-only visit.  In either situation, we cannot ensure that we have a secure connection.  Are you willing to proceed?" STAFF: Did the patient verbally acknowledge consent to telehealth visit? Document  YES/NO here: yes  4. Advise patient to be prepared - "Two hours prior to your appointment, go ahead and check your blood pressure, pulse, oxygen saturation, and your weight (if you have the equipment to check those) and write them all down. When your visit starts, your provider will ask you for this information. If you have an Apple Watch or Kardia device, please plan to have heart rate information ready on the day of your appointment. Please have a pen and paper handy nearby the day of the visit as well."  5. Give patient instructions for MyChart download to smartphone OR Doximity/Doxy.me as below if video visit (depending on what platform provider is using)  6. Inform patient they will receive a phone call 15 minutes prior to their appointment time (may be from unknown caller ID) so they should be prepared to answer    TELEPHONE CALL NOTE  Kathy Zimmerman has been deemed a candidate for a follow-up tele-health visit to limit community exposure during the Covid-19 pandemic. I spoke with the patient via phone to ensure availability of phone/video source, confirm preferred email & phone number, and discuss instructions and expectations.  I reminded Kathy Zimmerman to be prepared with any vital sign and/or heart rhythm information that could potentially be obtained via home monitoring, at the time of her visit. I reminded Kathy Zimmerman to expect a phone call prior to  her visit.  [name] 01/06/2019 1:09 PM   INSTRUCTIONS FOR DOWNLOADING THE MYCHART APP TO SMARTPHONE  - The patient must first make sure to have activated MyChart and know their login information - If Apple, go to [employer] and type in MyChart in the search bar and download the app. If Android, ask patient to go to [employer] and type in [place] in the search bar and download the app. The app is free but as with any other app downloads, their phone may require them to verify saved payment information or  Apple/Android password.  - The patient will need to then log into the app with their MyChart username and password, and select [facility] as their healthcare provider to link the account. When it is time for your visit, go to the MyChart app, find appointments, and click Begin Video Visit. Be sure to Select Allow for your device to access the Microphone and Camera for your visit. You will then be connected, and your provider will be with you shortly.  **If they have any issues connecting, or need assistance please contact MyChart service desk [phone])**  **If using a computer, in order to ensure the best quality for their visit they will need to use either of the following Internet Browsers: [employer], or Google Chrome**  IF USING DOXIMITY or DOXY.ME - The patient will receive a link just prior to their visit by text.     FULL LENGTH CONSENT FOR TELE-HEALTH VISIT   I hereby voluntarily request, consent and authorize [facility] and its employed or contracted physicians, physician assistants, nurse practitioners or other licensed health care professionals (the Practitioner), to provide me with telemedicine health care services (the Services") as deemed necessary by the treating Practitioner. I acknowledge and consent to receive the Services by the Practitioner via telemedicine. I understand that the telemedicine visit will involve communicating with the Practitioner through live audiovisual communication technology and the disclosure of certain medical information by electronic transmission. I acknowledge that I have been given the opportunity to request an in-person assessment or other available alternative prior to the telemedicine visit and am voluntarily participating in the telemedicine visit.  I understand that I have the right to withhold or withdraw my consent to the use of telemedicine in the course of my care at any time, without affecting my right to future care  or treatment, and that the Practitioner or I may terminate the telemedicine visit at any time. I understand that I have the right to inspect all information obtained and/or recorded in the course of the telemedicine visit and may receive copies of available information for a reasonable fee.  I understand that some of the potential risks of receiving the Services via telemedicine include:   Delay or interruption in medical evaluation due to technological equipment failure or disruption;  Information transmitted may not be sufficient (e.g. poor resolution of images) to allow for appropriate medical decision making by the Practitioner; and/or   In rare instances, security protocols could fail, causing a breach of personal health information.  Furthermore, I acknowledge that it is my responsibility to provide information about my medical history, conditions and care that is complete and accurate to the best of my ability. I acknowledge that Practitioner's advice, recommendations, and/or decision may be based on factors not within their control, such as incomplete or inaccurate data provided by me or distortions of diagnostic images or specimens that may result from electronic transmissions. I  understand that the practice of medicine is not an exact science and that Practitioner makes no warranties or guarantees regarding treatment outcomes. I acknowledge that I will receive a copy of this consent concurrently upon execution via email to the email address I last provided but may also request a printed copy by calling the office of [facility].    I understand that my insurance will be billed for this visit.   I have [place] or had this consent [place] to me.  I understand the contents of this consent, which adequately explains the benefits and risks of the Services being provided via telemedicine.   I have been provided ample opportunity to ask questions regarding this consent and the Services and have had  my questions answered to my satisfaction.  I give my informed consent for the services to be provided through the use of telemedicine in my medical care  By participating in this telemedicine visit I agree to the above.

## 2019-01-14 ENCOUNTER — Telehealth: Payer: Self-pay | Admitting: Cardiovascular Disease

## 2019-01-26 ENCOUNTER — Emergency Department (HOSPITAL_COMMUNITY)
Admission: EM | Admit: 2019-01-26 | Discharge: 2019-01-27 | Disposition: A | Discharge status: Home / Self Care | Payer: PRIVATE HEALTH INSURANCE | Attending: Emergency Medicine | Admitting: Emergency Medicine

## 2019-01-26 ENCOUNTER — Emergency Department (HOSPITAL_COMMUNITY): Payer: PRIVATE HEALTH INSURANCE

## 2019-01-26 ENCOUNTER — Encounter (HOSPITAL_COMMUNITY): Payer: Self-pay | Admitting: Emergency Medicine

## 2019-01-26 ENCOUNTER — Other Ambulatory Visit: Payer: Self-pay

## 2019-01-26 DIAGNOSIS — J45909 Unspecified asthma, uncomplicated: Secondary | ICD-10-CM | POA: Diagnosis not present

## 2019-01-26 DIAGNOSIS — Y929 Unspecified place or not applicable: Secondary | ICD-10-CM | POA: Diagnosis not present

## 2019-01-26 DIAGNOSIS — S60221A Contusion of right hand, initial encounter: Secondary | ICD-10-CM | POA: Insufficient documentation

## 2019-01-26 DIAGNOSIS — W228XXA Striking against or struck by other objects, initial encounter: Secondary | ICD-10-CM | POA: Diagnosis not present

## 2019-01-26 DIAGNOSIS — S6991XA Unspecified injury of right wrist, hand and finger(s), initial encounter: Secondary | ICD-10-CM | POA: Diagnosis present

## 2019-01-26 DIAGNOSIS — Y9389 Activity, other specified: Secondary | ICD-10-CM | POA: Insufficient documentation

## 2019-01-26 DIAGNOSIS — F332 Major depressive disorder, recurrent severe without psychotic features: Secondary | ICD-10-CM | POA: Diagnosis not present

## 2019-01-26 DIAGNOSIS — Y998 Other external cause status: Secondary | ICD-10-CM | POA: Diagnosis not present

## 2019-01-26 DIAGNOSIS — Z79899 Other long term (current) drug therapy: Secondary | ICD-10-CM | POA: Diagnosis not present

## 2019-01-26 DIAGNOSIS — F32A Depression, unspecified: Secondary | ICD-10-CM

## 2019-01-26 DIAGNOSIS — F329 Major depressive disorder, single episode, unspecified: Secondary | ICD-10-CM

## 2019-01-26 NOTE — ED Triage Notes (Signed)
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
## 2019-01-26 NOTE — ED Provider Notes (Signed)
[facility] EMERGENCY DEPARTMENT Provider Note   CSN: [id number] Arrival date & time: 01/26/19  1856    History   Chief Complaint Chief Complaint  Patient presents with  . V70.1  . Hand Pain    HPI Kathy Zimmerman is a 28 y.o. female.     The history is provided by the patient. No language interpreter was used.  Hand Pain  This is a new problem. The current episode started 1 to 2 hours ago. The problem has not changed since onset.Nothing aggravates the symptoms. Nothing relieves the symptoms. She has tried nothing for the symptoms. The treatment provided no relief.  Pt reports she was angry and hit a wall.  Patient reports she has been having problems with depression and she has had some suicidal thoughts.  She is planning to go into inpatient treatment on Wednesday and a facility in [place].  Patient reports she is not currently suicidal she states she does not have any plan.  Patient reports that she will be safe at home.  Patient reports she has had a good experience with the facility she is planning to go to and is hopeful that the treatment will be helpful  Past Medical History:  Diagnosis Date  . Anxiety   . Asthma   . Depression   . Headache   . IBS (irritable bowel syndrome)   . Panic attack   . Preeclampsia     Patient Active Problem List   Diagnosis Date Noted  . PTSD (post-traumatic stress disorder) 08/15/2018  . GAD (generalized anxiety disorder) 10/25/2017  . Recurrent vertigo 07/12/2017  . Hypertension 05/30/2017  . POTS (postural orthostatic tachycardia syndrome) 05/29/2017  . New onset of headaches 09/07/2016  . History of 2 cesarean sections 03/20/2016  . Hx of severe preeclampsia 03/20/2016  . Atypical chest pain 05/31/2015  . Anxiety 04/11/2015  . Retinal detachment of left eye with retinal break 08/08/2011    Class: Acute    Past Surgical History:  Procedure Laterality Date  . CESAREAN SECTION N/A 04/10/2015   Procedure: CESAREAN SECTION;   Surgeon: Crawford Givens, MD;  Location: [facility];  Service: Obstetrics;  Laterality: N/A;  . EYE SURGERY     laser  . SCLERAL BUCKLE  08/09/2011   Procedure: SCLERAL BUCKLE;  Surgeon: Clent Demark Rankin;  Location: [facility];  Service: Ophthalmology;  Laterality: Left;  with cryo  . TENDON REPAIR       OB History    Gravida  2   Para  2   Term      Preterm  2   AB      Living  2     SAB      TAB      Ectopic      Multiple  0   Live Births  2            Home Medications    Prior to Admission medications   Medication Sig Start Date End Date Taking? Authorizing Provider  atenolol (TENORMIN) 12.5 mg TABS tablet Take 12.5 mg by mouth daily.     [provider]  clonazePAM (KLONOPIN) 0.5 MG tablet Take 0.5 mg by mouth 3 (three) times daily as needed for anxiety.  10/25/17   [provider]  diphenhydrAMINE (BENADRYL) 25 MG tablet Take 1 tablet (25 mg total) by mouth every 6 (six) hours as needed for itching (Rash). Patient taking differently: Take 50 mg by mouth 2 (two) times daily as  needed for itching (Rash).  06/23/17   Muthersbaugh, Jarrett Soho, PA-C  EPINEPHrine (EPIPEN 2-PAK) 0.3 mg/0.3 mL IJ SOAJ injection Inject 0.3 mLs (0.3 mg total) into the muscle once as needed (for severe allergic reaction). CAll 911 immediately if you have to use this medicine 06/23/17   Muthersbaugh, Jarrett Soho, PA-C  lithium 600 MG capsule Take 600 mg by mouth daily.    [provider]  medroxyPROGESTERone (DEPO-PROVERA) 150 MG/ML injection Inject 150 mg into the muscle every 3 (three) months.  05/03/18   [provider]  megestrol (MEGACE) 40 MG tablet 3x5d, 2x5d, then 1 daily to help control vaginal bleeding. Stop taking when bleeding stops. 12/11/18   Roma Schanz, CNM  PROAIR HFA 108 [phone] Base) MCG/ACT inhaler Inhale 2 puffs into the lungs every 4 (four) hours as needed for wheezing.  11/10/16   [provider]  sertraline (ZOLOFT) 100 MG tablet Take 100 mg by  mouth daily.    [provider]    Family History Family History  Problem Relation Age of Onset  . Depression Father   . Depression Sister   . Mental illness Sister   . Depression Maternal Grandmother   . Mental illness Maternal Grandmother   . Thyroid disease Maternal Grandmother   . Heart disease Maternal Grandfather   . Hypertension Maternal Grandfather   . Hyperlipidemia Maternal Grandfather   . CAD Maternal Grandfather   . Thyroid disease Other   . CAD Other     Social History Social History   Tobacco Use  . Smoking status: Never Smoker  . Smokeless tobacco: Never Used  Substance Use Topics  . Alcohol use: No  . Drug use: No     Allergies   Cat hair extract; Shrimp [shellfish allergy]; and Sulfa antibiotics   Review of Systems Review of Systems  All other systems reviewed and are negative.    Physical Exam Updated Vital Signs BP 134/89 (BP Location: Left Arm)   Pulse 90   Temp 99 F (37.2 C) (Oral)   Resp 17   Ht 5\' 6"  (1.676 m)   Wt 73.6 kg   LMP 01/26/2019   SpO2 100%   BMI 26.19 kg/m   Physical Exam Vitals signs reviewed.  HENT:     Head: Normocephalic.  Cardiovascular:     Rate and Rhythm: Normal rate.  Pulmonary:     Effort: Pulmonary effort is normal.  Abdominal:     General: Abdomen is flat.  Musculoskeletal:        General: Swelling and tenderness present.  Skin:    General: Skin is warm.  Neurological:     General: No focal deficit present.     Mental Status: She is alert.  Psychiatric:        Mood and Affect: Mood normal.      ED Treatments / Results  Labs (all labs ordered are listed, but only abnormal results are displayed) Labs Reviewed - No data to display  EKG None  Radiology Dg Hand Complete Right  Result Date: 01/26/2019 CLINICAL DATA:  28 y/o F; punched a wall. Right wrist and hand pain. EXAM: RIGHT HAND - COMPLETE 3+ VIEW COMPARISON:  09/07/2018 right hand radiographs FINDINGS: There is no  evidence of fracture or dislocation. There is no evidence of arthropathy or other focal bone abnormality. Soft tissues are unremarkable. IMPRESSION: Negative. Electronically Signed   By: Kristine Garbe M.D.   On: 01/26/2019 19:47    Procedures Procedures (including critical care  time)  Medications Ordered in ED Medications - No data to display   Initial Impression / Assessment and Plan / ED Course  I have reviewed the triage vital signs and the nursing notes.  Pertinent labs & imaging results that were available during my care of the patient were reviewed by me and considered in my medical decision making (see chart for details).        Ace wrap to hand.  I will have tts talk with pt.    Final Clinical Impressions(s) / ED Diagnoses   Final diagnoses:  Contusion of right hand, initial encounter  Depression, unspecified depression type    ED Discharge Orders    None       Sidney Ace 01/27/19 1029    Dorie Rank, MD 01/30/19 [phone]

## 2019-01-27 LAB — URINALYSIS, ROUTINE W REFLEX MICROSCOPIC
Bilirubin Urine: NEGATIVE
Glucose, UA: NEGATIVE mg/dL
Ketones, ur: NEGATIVE mg/dL
Nitrite: NEGATIVE
Protein, ur: NEGATIVE mg/dL
Specific Gravity, Urine: 1.006 (ref 1.005–1.030)
pH: 7 (ref 5.0–8.0)

## 2019-01-27 LAB — RAPID URINE DRUG SCREEN, HOSP PERFORMED
Amphetamines: NOT DETECTED
Barbiturates: NOT DETECTED
Benzodiazepines: NOT DETECTED
Cocaine: NOT DETECTED
Opiates: NOT DETECTED
Tetrahydrocannabinol: NOT DETECTED

## 2019-01-27 LAB — PREGNANCY, URINE: Preg Test, Ur: NEGATIVE

## 2019-01-27 NOTE — ED Notes (Signed)
[facility] recommendation: Discharge from emergency department and follow up with outpatient care.
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
## 2019-01-27 NOTE — ED Notes (Signed)
TTS at bedside. 

## 2019-01-27 NOTE — ED Provider Notes (Signed)
Patient has been seen by TTS.  She does not meet inpatient criteria.  She denies any current suicidal thoughts, homicidal thoughts, or hallucinations.  She states she is going to inpatient residential treatment in 2 days for treatment for depression and anxiety.  She feels safe going home tonight states she will come back earlier if anything changes.  She is smiling and answering questions appropriately.  She denies any current suicidal thoughts or homicidal thoughts. She appears appropriate for discharge.  BP 126/80 (BP Location: Right Arm)   Pulse 88   Temp 98.8 F (37.1 C) (Oral)   Resp 18   Ht 5\' 6"  (1.676 m)   Wt 73.6 kg   LMP 01/26/2019   SpO2 99%   BMI 26.19 kg/m     Ezequiel Essex, MD 01/27/19 [phone]

## 2019-01-27 NOTE — Discharge Instructions (Signed)
Follow-up for your residential treatment as planned.  Your x-ray of your hand is negative.  Use Tylenol or ibuprofen as needed for pain.  Return to the ED with new or worsening symptoms.

## 2019-02-01 ENCOUNTER — Other Ambulatory Visit: Payer: Self-pay | Admitting: Women's Health

## 2019-02-14 NOTE — Progress Notes (Signed)
Nurse started to prechart on patient prior to arrival but patient ended up cancelling and rescheduling.

## 2019-02-17 ENCOUNTER — Other Ambulatory Visit: Payer: Self-pay | Admitting: Women's Health

## 2019-02-17 MED ORDER — MEGESTROL ACETATE 40 MG PO TABS: ORAL_TABLET | ORAL | 1 refills | Status: DC

## 2019-03-03 ENCOUNTER — Telehealth: Payer: Self-pay

## 2019-03-03 MED ORDER — ATENOLOL 25 MG PO TABS: 12.5000 mg | ORAL_TABLET | Freq: Two times a day (BID) | ORAL | 0 refills | Status: DC

## 2019-03-03 NOTE — Telephone Encounter (Signed)
MADE APPOINTMENT & SENT IN REFILL

## 2019-03-04 NOTE — Telephone Encounter (Signed)
error 

## 2019-03-09 ENCOUNTER — Ambulatory Visit (HOSPITAL_COMMUNITY)
Admission: RE | Admit: 2019-03-09 | Discharge: 2019-03-09 | Disposition: A | Discharge status: Home / Self Care | Payer: PRIVATE HEALTH INSURANCE | Attending: Psychiatry | Admitting: Psychiatry

## 2019-03-09 DIAGNOSIS — R251 Tremor, unspecified: Secondary | ICD-10-CM | POA: Insufficient documentation

## 2019-03-09 DIAGNOSIS — R55 Syncope and collapse: Secondary | ICD-10-CM | POA: Diagnosis not present

## 2019-03-09 DIAGNOSIS — F4310 Post-traumatic stress disorder, unspecified: Secondary | ICD-10-CM | POA: Diagnosis not present

## 2019-03-09 DIAGNOSIS — R450 Nervousness: Secondary | ICD-10-CM | POA: Insufficient documentation

## 2019-03-09 DIAGNOSIS — F410 Panic disorder [episodic paroxysmal anxiety] without agoraphobia: Secondary | ICD-10-CM | POA: Insufficient documentation

## 2019-03-09 DIAGNOSIS — R413 Other amnesia: Secondary | ICD-10-CM | POA: Diagnosis not present

## 2019-03-09 DIAGNOSIS — F4312 Post-traumatic stress disorder, chronic: Secondary | ICD-10-CM | POA: Insufficient documentation

## 2019-03-09 DIAGNOSIS — R45851 Suicidal ideations: Secondary | ICD-10-CM | POA: Insufficient documentation

## 2019-03-09 DIAGNOSIS — Z79899 Other long term (current) drug therapy: Secondary | ICD-10-CM | POA: Diagnosis not present

## 2019-03-09 DIAGNOSIS — F332 Major depressive disorder, recurrent severe without psychotic features: Secondary | ICD-10-CM | POA: Insufficient documentation

## 2019-03-09 DIAGNOSIS — R002 Palpitations: Secondary | ICD-10-CM | POA: Diagnosis not present
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
## 2019-03-09 NOTE — H&P (Signed)
Behavioral Health Medical Screening Exam  Kathy Zimmerman is an 28 y.o. female.with chronic PTSD manifesting as severe anxiety with panic;MDD with multiple treatments including Inpt;Residential &PHP ([facility]);and currently Individual (Dr Altamese [place] MD Psychiatry and [facility] in [place]). She was here in March and referred to [facility] but she says her insurance would not cover Virtual therapy. She comes tonite at the behest of her therapist stating "I cant stand the pain" She does not elucidate the "pain" but does report extensively on the feelings and thinking it o produces especially her anxiety. Unfortunate she took what was described as an "overdose of Klonopin -6 0.5 mg Klonopin" in March after discharge from [facility].  Tonite she says she was not intending to kill herself she only wanted to "feel" the medication.She has since not gotten a prescription for Klonopin and her anxiety is out of control as she has not mastered teh behavioral techniques designed to help her control it.Gabapentin has not been effective either.She asks about needing medication adjustment. She denies she would hurt herself stating "I wont hurt myself " when asked directly.Marland Kitchen Her SI is at baseline without desire or plan to commit. She reports she has an appointment with Dr Altamese [place] tomorrow and that her therapist suggested she come here after talking to her this PM.  she is still willing to come to the [facility] as it is now open for Group attendance. She can return home safely to her parents.  Total Time spent with patient: 20 minutes  Psychiatric Specialty Exam: Physical Exam  Constitutional: She is oriented to person, place, and time. She appears well-developed and well-nourished. She appears distressed (Anxious).  HENT:  Head: Normocephalic and atraumatic.  Right Ear: External ear normal.  Left Ear: External ear normal.  Nose: Nose normal.  Eyes: Pupils are equal, round, and  reactive to light. Conjunctivae and EOM are normal. Right eye exhibits discharge. Left eye exhibits discharge. No scleral icterus.  Neck: Normal range of motion. Neck supple. No tracheal deviation present. No thyromegaly present.  Cardiovascular: Normal rate and regular rhythm.  Respiratory: No stridor. No respiratory distress. She has no wheezes.  GI:  deferred  Genitourinary:    Genitourinary Comments: deferred   Musculoskeletal: Normal range of motion.        General: No edema.  Neurological: She is alert and oriented to person, place, and time. She has normal reflexes. No cranial nerve deficit. Coordination normal.  Skin: Skin is warm and dry. No rash noted. No erythema. No pallor.  Psychiatric:  See MSE    Review of Systems  Constitutional: Negative for chills, diaphoresis, fever, malaise/fatigue and weight loss.  Respiratory: Negative for cough, hemoptysis, sputum production, shortness of breath and wheezing.   Cardiovascular: Positive for palpitations. Negative for chest pain, orthopnea, claudication, leg swelling and PND.  Gastrointestinal: Negative for abdominal pain, blood in stool, constipation, diarrhea, heartburn, melena, nausea and vomiting.  Genitourinary: Negative for dysuria, flank pain, frequency, hematuria and urgency.  Musculoskeletal: Negative for back pain, falls, joint pain, myalgias and neck pain.  Neurological: Positive for tremors and loss of consciousness (Panic). Negative for dizziness, tingling, sensory change, speech change, focal weakness, seizures, weakness and headaches.  Endo/Heme/Allergies: Negative for environmental allergies and polydipsia. Does not bruise/bleed easily.  Psychiatric/Behavioral: Positive for depression, memory loss (CPTSD-suppression) and suicidal ideas (thoughts without ideation/ motivation /plan ). Negative for hallucinations and substance abuse. The patient is nervous/anxious. The patient does not have insomnia.  CPTSD    Blood  pressure (!) 128/94, temperature 99 F (37.2 C), temperature source Oral, resp. rate 18.There is no height or weight on file to calculate BMI.  General Appearance: Casual, Neat and Well Groomed  Eye Contact:  Good  Speech:  Clear and Coherent  Volume:  Normal  Mood:  Anxious  Affect:  Congruent  Thought Process:  Coherent, Goal Directed and Descriptions of Associations: Intact  Orientation:  Full (Time, Place, and Person)  Thought Content:  WDL, Logical, Illogical, Obsessions and Rumination  Suicidal Thoughts:  chronic thoughts without ideation/intent  Homicidal Thoughts:  No  Memory:  Traumatic  Judgement:  Impaired  Insight:  Lacking  Psychomotor Activity:  Increased and Restlessness  Concentration: Concentration: Good and Attention Span: Good  Recall:  Intact except for trauma  Fund of Knowledge:WDL  Language: WDL  Akathisia:  she has motor restlessness of legs/feet sitting but not possible to determine cause-she does of a history of akatisia with increaser in Abilify at one time  Handed:  Right  AIMS (if indicated):   not done at this visit  Assets:  Desire for Improvement Financial Resources/Insurance Housing Social Support Transportation  Sleep:       Musculoskeletal: Strength & Muscle Tone: within normal limits Gait & Station: normal Patient leans: N/A  Blood pressure (!) 128/94, temperature 99 F (37.2 C), temperature source Oral, resp. rate 18.  Recommendations:  Based on my evaluation the patient does not appear to have an emergency medical condition.  Keep appt with Dr Altamese [place] and enter IOP  Darlyne Russian, PA-C 03/09/2019, 11:30 PM

## 2019-03-10 ENCOUNTER — Telehealth (HOSPITAL_COMMUNITY): Payer: Self-pay | Admitting: Psychiatry

## 2019-03-10 NOTE — Telephone Encounter (Signed)
D:  Darlyne Russian, PA referred pt to [facility].  Pt was referred back in March 2020; but she declined d/t groups being virtual d/t COVID-19 and her insurance co not covering virtual.  Informed pt that the groups are still virtual.  Pt is still declining at this time.  States she will be going on vacation the week of July 13th and may consider virtual upon return, if her insurance company will cover it.  A:  Inform Darlyne Russian, PA and Beather Arbour, RN (mgr).

## 2019-03-23 NOTE — Progress Notes (Deleted)
Cardiology Office Note    Date:  03/23/2019   ID:  Kathy Zimmerman, DOB Dec 06, 1990, MRN [id number]  PCP:  Celene Squibb, MD  Cardiologist: Jenkins Rouge, MD    No chief complaint on file.   History of Present Illness:    Kathy Zimmerman is a 28 y.o. female with past medical history of palpitations (possibly secondary to POTS), anxiety, and asthma who presents to the office today for f/u of tachycardia  Seen on ER May 2020 and going to inpatient behavioral health for anxiety and depression    Has had labile BP and HR;s likely related to her behavioral issues Currently on Zoloft, Lithium and Klonopin   She has been tried on atenolol 12.5 bid for her HR and then changed to Toprol by PA 05/31/18 Echo 10/20/16 normal  ETT 10/20/16 HR 88->190 normal response with rise in BP to 150/80 mmHg  ***   Past Medical History:  Diagnosis Date   Anxiety    Asthma    Depression    Headache    IBS (irritable bowel syndrome)    Panic attack    Preeclampsia     Past Surgical History:  Procedure Laterality Date   CESAREAN SECTION N/A 04/10/2015   Procedure: CESAREAN SECTION;  Surgeon: Crawford Givens, MD;  Location: [facility];  Service: Obstetrics;  Laterality: N/A;   EYE SURGERY     laser   SCLERAL BUCKLE  08/09/2011   Procedure: SCLERAL BUCKLE;  Surgeon: Clent Demark Rankin;  Location: [facility];  Service: Ophthalmology;  Laterality: Left;  with cryo   TENDON REPAIR      Current Medications: Outpatient Medications Prior to Visit  Medication Sig Dispense Refill   atenolol (TENORMIN) 12.5 mg TABS tablet Take 12.5 mg by mouth daily.     atenolol (TENORMIN) 25 MG tablet Take 0.5 tablets (12.5 mg total) by mouth 2 (two) times daily. 90 tablet 0   clonazePAM (KLONOPIN) 0.5 MG tablet Take 0.5 mg by mouth 3 (three) times daily as needed for anxiety.      diphenhydrAMINE (BENADRYL) 25 MG tablet Take 1 tablet (25 mg total) by mouth every 6 (six) hours as needed for itching (Rash). (Patient  taking differently: Take 50 mg by mouth 2 (two) times daily as needed for itching (Rash). ) 30 tablet 0   EPINEPHrine (EPIPEN 2-PAK) 0.3 mg/0.3 mL IJ SOAJ injection Inject 0.3 mLs (0.3 mg total) into the muscle once as needed (for severe allergic reaction). CAll 911 immediately if you have to use this medicine 1 Device 2   lithium 600 MG capsule Take 600 mg by mouth daily.     medroxyPROGESTERone (DEPO-PROVERA) 150 MG/ML injection Inject 150 mg into the muscle every 3 (three) months.      megestrol (MEGACE) 40 MG tablet 3x5d, 2x5d, then 1 daily to help control vaginal bleeding. Stop taking when bleeding stops. 45 tablet 1   PROAIR HFA 108 (90 Base) MCG/ACT inhaler Inhale 2 puffs into the lungs every 4 (four) hours as needed for wheezing.   3   sertraline (ZOLOFT) 100 MG tablet Take 100 mg by mouth daily.     No facility-administered medications prior to visit.      Allergies:   Cat hair extract, Shrimp [shellfish allergy], and Sulfa antibiotics   Social History   Socioeconomic History   Marital status: Married    Spouse name: Not on file   Number of children: Not on file   Years of education: Not  on file   Highest education level: Not on file  Occupational History   Not on file  Social Needs   Financial resource strain: Not on file   Food insecurity    Worry: Not on file    Inability: Not on file   Transportation needs    Medical: Not on file    Non-medical: Not on file  Tobacco Use   Smoking status: Never Smoker   Smokeless tobacco: Never Used  Substance and Sexual Activity   Alcohol use: No   Drug use: No   Sexual activity: Not Currently    Birth control/protection: None  Lifestyle   Physical activity    Days per week: Not on file    Minutes per session: Not on file   Stress: Not on file  Relationships   Social connections    Talks on phone: Not on file    Gets together: Not on file    Attends religious service: Not on file    Active member of  club or organization: Not on file    Attends meetings of clubs or organizations: Not on file    Relationship status: Not on file  Other Topics Concern   Not on file  Social History Narrative   Not on file     Family History:  The patient's family history includes CAD in her maternal grandfather and other; Depression in her father, maternal grandmother, and sister; Heart disease in her maternal grandfather; Hyperlipidemia in her maternal grandfather; Hypertension in her maternal grandfather; Mental illness in her maternal grandmother and sister; Thyroid disease in her maternal grandmother and other.   Review of Systems:   Please see the history of present illness.     General:  No chills, fever, night sweats or weight changes. Positive for dizziness.  Cardiovascular:  No chest pain, dyspnea on exertion, edema, orthopnea, paroxysmal nocturnal dyspnea. Positive for palpitations.  Dermatological: No rash, lesions/masses Respiratory: No cough, dyspnea Urologic: No hematuria, dysuria Abdominal:   No nausea, vomiting, diarrhea, bright red blood per rectum, melena, or hematemesis Neurologic:  No visual changes, wkns, changes in mental status. All other systems reviewed and are otherwise negative except as noted above.   Physical Exam:    VS:  There were no vitals taken for this visit.   Affect appropriate Healthy:  appears stated age 69: normal Neck supple with no adenopathy JVP normal no bruits no thyromegaly Lungs clear with no wheezing and good diaphragmatic motion Heart:  S1/S2 no murmur, no rub, gallop or click PMI normal Abdomen: benighn, BS positve, no tenderness, no AAA no bruit.  No HSM or HJR Distal pulses intact with no bruits No edema Neuro non-focal Skin warm and dry No muscular weakness   Wt Readings from Last 3 Encounters:  01/26/19 162 lb 4 oz (73.6 kg)  09/07/18 150 lb (68 kg)  08/20/18 151 lb (68.5 kg)     Studies/Labs Reviewed:   EKG:  EKG is  ordered today.  The ekg ordered today demonstrates NSR, HR 80, with no acute ST changes when compared to prior tracings.   Recent Labs: 11/28/2018: ALT 16; BUN 10; Creatinine, Ser 0.74; Hemoglobin 13.2; Platelets 239; Potassium 3.8; Sodium 140   Lipid Panel No results found for: CHOL, TRIG, HDL, CHOLHDL, VLDL, LDLCALC, LDLDIRECT  Additional studies/ records that were reviewed today include:   ETT: 10/2016  No diagnostic ST segment changes in the setting of lead motion artifact. Duke treadmill score is 3, technically intermediate risk  due to nonlimiting chest pain, but no diagnostic ST segment changes or arrhythmias.  Blood pressure demonstrated a normal response to exercise.  Echocardiogram: 10/2016 Study Conclusions  - Left ventricle: The cavity size was normal. Wall thickness was   increased in a pattern of mild LVH. Systolic function was normal.   The estimated ejection fraction was in the range of 55% to 60%.   Wall motion was normal; there were no regional wall motion   abnormalities. Left ventricular diastolic function parameters   were normal. - Right atrium: Central venous pressure (est): 3 mm Hg. - Atrial septum: No defect or patent foramen ovale was identified. - Tricuspid valve: There was trivial regurgitation. - Pulmonary arteries: Systolic pressure could not be accurately   estimated. - Pericardium, extracardiac: There was no pericardial effusion.  Impressions:  - Mildly increased LV wall thickness with LVEF 55-60% and normal   diastolic function. Trivial tricuspid regurgitation.   Assessment:    No diagnosis found.   Plan:   In order of problems listed above:  1. Postural Orthostatic Tachycardiac Syndrome/ Palpitations - ***  2. HTN - Labile no need for Rx other than beta blocker        Signed, Jenkins Rouge, MD  03/23/2019 3:44 PM    [facility]. [address] [place], [place] [address] Phone: [phone]

## 2019-03-25 ENCOUNTER — Ambulatory Visit: Payer: PRIVATE HEALTH INSURANCE | Admitting: Cardiovascular Disease

## 2019-04-02 ENCOUNTER — Other Ambulatory Visit: Payer: Self-pay | Admitting: Women's Health

## 2019-04-21 ENCOUNTER — Other Ambulatory Visit: Payer: Self-pay | Admitting: Women's Health

## 2019-04-22 ENCOUNTER — Encounter: Payer: Medicare Other | Admitting: Student

## 2019-04-22 ENCOUNTER — Other Ambulatory Visit: Payer: Self-pay

## 2019-04-22 NOTE — Progress Notes (Signed)
This encounter was created in error - please disregard.

## 2019-04-29 ENCOUNTER — Other Ambulatory Visit: Payer: Self-pay

## 2019-04-29 ENCOUNTER — Ambulatory Visit (INDEPENDENT_AMBULATORY_CARE_PROVIDER_SITE_OTHER): Payer: Medicare Other

## 2019-04-29 VITALS — Ht 66.0 in | Wt 154.8 lb

## 2019-04-29 DIAGNOSIS — Z3042 Encounter for surveillance of injectable contraceptive: Secondary | ICD-10-CM | POA: Diagnosis not present

## 2019-04-29 MED ORDER — MEDROXYPROGESTERONE ACETATE 150 MG/ML IM SUSP
150.0000 mg | Freq: Once | INTRAMUSCULAR | Status: AC
  Administered 2019-04-29: 150 mg via INTRAMUSCULAR

## 2019-04-29 NOTE — Progress Notes (Signed)
Pt here for depo injection 150 mg IM given rt deltoid. Tolerated well. Return 12 weeks for next injection. Pad CMA 

## 2019-05-26 ENCOUNTER — Other Ambulatory Visit: Payer: Self-pay | Admitting: Cardiovascular Disease

## 2019-06-14 ENCOUNTER — Other Ambulatory Visit: Payer: Self-pay | Admitting: Women's Health

## 2019-06-27 ENCOUNTER — Other Ambulatory Visit: Payer: Self-pay | Admitting: Cardiovascular Disease

## 2019-06-30 ENCOUNTER — Other Ambulatory Visit: Payer: Self-pay | Admitting: Women's Health

## 2019-07-17 ENCOUNTER — Other Ambulatory Visit: Payer: Self-pay

## 2019-07-17 ENCOUNTER — Ambulatory Visit (INDEPENDENT_AMBULATORY_CARE_PROVIDER_SITE_OTHER): Payer: Medicare Other

## 2019-07-17 VITALS — Ht 66.0 in | Wt 163.0 lb

## 2019-07-17 DIAGNOSIS — Z3042 Encounter for surveillance of injectable contraceptive: Secondary | ICD-10-CM | POA: Diagnosis not present

## 2019-07-17 MED ORDER — MEDROXYPROGESTERONE ACETATE 150 MG/ML IM SUSP
150.0000 mg | Freq: Once | INTRAMUSCULAR | Status: AC
  Administered 2019-07-17: 150 mg via INTRAMUSCULAR

## 2019-07-17 NOTE — Progress Notes (Signed)
   NURSE VISIT- INJECTION  SUBJECTIVE:  Kathy Zimmerman is a 28 y.o. [phone] female here for a Depo Provera for contraception/period management. She is a GYN patient.   OBJECTIVE:  Ht 5\' 6"  (1.676 m)   Wt 163 lb (73.9 kg)   BMI 26.31 kg/m   Appears well, in no apparent distress  Injection administered in: Left deltoid  Meds ordered this encounter  Medications  . medroxyPROGESTERone (DEPO-PROVERA) injection 150 mg    ASSESSMENT: GYN patient Depo Provera for contraception/period management  PLAN: Follow-up: in 11-13 weeks for next Depo   Ladonna Snide  07/17/2019 10:47 AM

## 2019-07-22 ENCOUNTER — Ambulatory Visit: Payer: PRIVATE HEALTH INSURANCE

## 2019-07-24 ENCOUNTER — Other Ambulatory Visit: Payer: Self-pay | Admitting: Cardiovascular Disease

## 2019-07-28 ENCOUNTER — Other Ambulatory Visit: Payer: Self-pay | Admitting: Cardiovascular Disease

## 2019-07-29 ENCOUNTER — Ambulatory Visit (INDEPENDENT_AMBULATORY_CARE_PROVIDER_SITE_OTHER): Payer: Medicare Other | Admitting: Cardiovascular Disease

## 2019-07-29 ENCOUNTER — Encounter: Payer: Self-pay | Admitting: Cardiovascular Disease

## 2019-07-29 ENCOUNTER — Other Ambulatory Visit: Payer: Self-pay

## 2019-07-29 VITALS — BP 118/82 | HR 73 | Ht 66.0 in | Wt 167.0 lb

## 2019-07-29 DIAGNOSIS — R002 Palpitations: Secondary | ICD-10-CM | POA: Diagnosis not present

## 2019-07-29 NOTE — Patient Instructions (Addendum)
Medication Instructions:   *If you need a refill on your cardiac medications before your next appointment, please call your pharmacy*  Lab Work:  If you have labs (blood work) drawn today and your tests are completely normal, you will receive your results only by: Marland Kitchen MyChart Message (if you have MyChart) OR . A paper copy in the mail If you have any lab test that is abnormal or we need to change your treatment, we will call you to review the results.  Testing/Procedures: Your physician has recommended that you wear an event monitor. Event monitors are medical devices that record the heart's electrical activity. Doctors most often [place] these monitors to diagnose arrhythmias. Arrhythmias are problems with the speed or rhythm of the heartbeat. The monitor is a small, portable device. You can wear one while you do your normal daily activities. This is usually used to diagnose what is causing palpitations/syncope (passing out).  Follow-Up: At [facility], you and your health needs are our priority.  As part of our continuing mission to provide you with exceptional heart care, we have created designated Provider Care Teams.  These Care Teams include your primary Cardiologist (physician) and Advanced Practice Providers (APPs -  Physician Assistants and Nurse Practitioners) who all work together to provide you with the care you need, when you need it.  Your next appointment:   6 to 8 weeks at [facility] or [facility].   The format for your next appointment:   In Person  Provider:   You may see Jenkins Rouge, MD or one of the following Advanced Practice Providers on your designated Care Team:    Truitt Merle, NP  Cecilie Kicks, NP  Kathyrn Drown, NP

## 2019-07-29 NOTE — Progress Notes (Signed)
Cardiology Office Note    Date:  07/29/2019   ID:  Jerrye Bushy Skeels, DOB November 30, 1990, MRN [id number]  PCP:  Celene Squibb, MD  Cardiologist: Jenkins Rouge, MD    No chief complaint on file.   History of Present Illness:    Kathy Zimmerman is a 28 y.o. female with past medical history of palpitations (possibly secondary to POTS), anxiety, and asthma who presents to the office today for evaluation of palpitations and nausea   Last few visits seen by PA with complaints of variable BP and palpitations   In talking with the patient today, she reports having intermittent episodes of palpitations which typically occurs mostly when ambulating. She does have a Ecologist along with a pulse oximeter and reports heart rate goes into the 150's with minimal activity but then stabilizes within several seconds. She also reports having intermittent episodes of hypotension and then elevated BP. Reports SBP is sometimes in the low 80's after taking Atenolol but then is elevated into the 140's at times.  When she does experience hypotension, she reports associated dizziness and fatigue. Denies any excessive alcohol or caffeine consumption. Reports she does not consume a regular amount of fluids throughout the day.  Compliant with tenormin. 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 Anxiety   . Asthma   . Depression   . Headache   . IBS (irritable bowel syndrome)   . Panic attack   . Preeclampsia     Past Surgical History:  Procedure Laterality Date  . CESAREAN SECTION N/A 04/10/2015   Procedure: CESAREAN SECTION;  Surgeon: Crawford Givens, MD;  Location: [facility];  Service: Obstetrics;  Laterality: N/A;  . EYE SURGERY     laser  . SCLERAL BUCKLE  08/09/2011   Procedure: SCLERAL BUCKLE;  Surgeon: Clent Demark Rankin;  Location: [facility];   Service: Ophthalmology;  Laterality: Left;  with cryo  . TENDON REPAIR      Current Medications: Outpatient Medications Prior to Visit  Medication Sig Dispense Refill  . atenolol (TENORMIN) 25 MG tablet TAKE HALF A TAB BY MOUTH 2 (TWO) TIMES DAILY. OFFICE VISIT NEEDED BEFORE ADDITIONAL REFILLS 30 tablet 0  . clonazePAM (KLONOPIN) 0.5 MG tablet Take 0.5 mg by mouth 3 (three) times daily as needed for anxiety.     . diphenhydrAMINE (BENADRYL) 25 MG tablet Take 1 tablet (25 mg total) by mouth every 6 (six) hours as needed for itching (Rash). (Patient taking differently: Take 50 mg by mouth 2 (two) times daily as needed for itching (Rash). ) 30 tablet 0  . EPINEPHrine (EPIPEN 2-PAK) 0.3 mg/0.3 mL IJ SOAJ injection Inject 0.3 mLs (0.3 mg total) into the muscle once as needed (for severe allergic reaction). CAll 911 immediately if you have to use this medicine 1 Device 2  . lithium 600 MG capsule Take 600 mg by mouth daily.    . medroxyPROGESTERone (DEPO-PROVERA) 150 MG/ML injection INJECT 1 ML (150 MG TOTAL) INTO THE MUSCLE EVERY 3 (THREE) MONTHS. 1 mL 3  . megestrol (MEGACE) 40 MG tablet TAKE 3 TABS X 5 DAYS, 2 TABS X 5 DAYS, 1 TAB DAILY TO CONTROL VAG BLEEDING, STOP WHEN BLEEDING STOPS 135 tablet 1  . PROAIR HFA 108 (90 Base) MCG/ACT inhaler Inhale 2 puffs into the lungs every  4 (four) hours as needed for wheezing.   3  . sertraline (ZOLOFT) 100 MG tablet Take 100 mg by mouth daily.     No facility-administered medications prior to visit.      Allergies:   Cat hair extract, Shrimp [shellfish allergy], Sulfasalazine, and Sulfa antibiotics   Social History   Socioeconomic History  . Marital status: Married    Spouse name: Not on file  . Number of children: 2  . Years of education: Not on file  . Highest education level: Not on file  Occupational History  . Not on file  Social Needs  . Financial resource strain: Not on file  . Food insecurity    Worry: Not on file    Inability: Not on  file  . Transportation needs    Medical: Not on file    Non-medical: Not on file  Tobacco Use  . Smoking status: Never Smoker  . Smokeless tobacco: Never Used  Substance and Sexual Activity  . Alcohol use: No  . Drug use: No  . Sexual activity: Yes    Birth control/protection: None, Injection  Lifestyle  . Physical activity    Days per week: Not on file    Minutes per session: Not on file  . Stress: Not on file  Relationships  . Social Herbalist on phone: Not on file    Gets together: Not on file    Attends religious service: Not on file    Active member of club or organization: Not on file    Attends meetings of clubs or organizations: Not on file    Relationship status: Not on file  Other Topics Concern  . Not on file  Social History Narrative  . Not on file     Family History:  The patient's family history includes CAD in her maternal grandfather and other; Depression in her father, maternal grandmother, and sister; Heart disease in her maternal grandfather; Hyperlipidemia in her maternal grandfather; Hypertension in her maternal grandfather; Mental illness in her maternal grandmother and sister; Thyroid disease in her maternal grandmother and other.   Review of Systems:   Please see the history of present illness.     General:  No chills, fever, night sweats or weight changes. Positive for dizziness.  Cardiovascular:  No chest pain, dyspnea on exertion, edema, orthopnea, paroxysmal nocturnal dyspnea. Positive for palpitations.  Dermatological: No rash, lesions/masses Respiratory: No cough, dyspnea Urologic: No hematuria, dysuria Abdominal:   No nausea, vomiting, diarrhea, bright red blood per rectum, melena, or hematemesis Neurologic:  No visual changes, wkns, changes in mental status. All other systems reviewed and are otherwise negative except as noted above.   Physical Exam:    VS:  BP 118/82   Pulse 73   Ht 5\' 6"  (1.676 m)   Wt 167 lb (75.8 kg)    SpO2 98%   BMI 26.95 kg/m     Affect appropriate Healthy:  appears stated age 75: normal Neck supple with no adenopathy JVP normal no bruits no thyromegaly Lungs clear with no wheezing and good diaphragmatic motion Heart:  S1/S2 no murmur, no rub, gallop or click PMI normal Abdomen: benighn, BS positve, no tenderness, no AAA no bruit.  No HSM or HJR Distal pulses intact with no bruits No edema Neuro non-focal Skin warm and dry No muscular weakness   Wt Readings from Last 3 Encounters:  07/29/19 167 lb (75.8 kg)  07/17/19 163 lb (73.9 kg)  04/29/19 154  lb 12.8 oz (70.2 kg)     Studies/Labs Reviewed:   EKG:  NSR, HR 80, with no acute ST changes when compared to prior tracings.   Recent Labs: 11/28/2018: ALT 16; BUN 10; Creatinine, Ser 0.74; Hemoglobin 13.2; Platelets 239; Potassium 3.8; Sodium 140   Lipid Panel No results found for: CHOL, TRIG, HDL, CHOLHDL, VLDL, LDLCALC, LDLDIRECT  Additional studies/ records that were reviewed today include:   ETT: 10/2016  No diagnostic ST segment changes in the setting of lead motion artifact. Duke treadmill score is 3, technically intermediate risk due to nonlimiting chest pain, but no diagnostic ST segment changes or arrhythmias.  Blood pressure demonstrated a normal response to exercise.  Echocardiogram: 10/2016 Study Conclusions  - Left ventricle: The cavity size was normal. Wall thickness was   increased in a pattern of mild LVH. Systolic function was normal.   The estimated ejection fraction was in the range of 55% to 60%.   Wall motion was normal; there were no regional wall motion   abnormalities. Left ventricular diastolic function parameters   were normal. - Right atrium: Central venous pressure (est): 3 mm Hg. - Atrial septum: No defect or patent foramen ovale was identified. - Tricuspid valve: There was trivial regurgitation. - Pulmonary arteries: Systolic pressure could not be accurately   estimated. -  Pericardium, extracardiac: There was no pericardial effusion.  Impressions:  - Mildly increased LV wall thickness with LVEF 55-60% and normal   diastolic function. Trivial tricuspid regurgitation.   Assessment:    Palpitations   Plan:   In order of problems listed above:  1. Postural Orthostatic Tachycardiac Syndrome/ Palpitations - not postural today BP [id number] systolic and symptoms primarily palpitations   2. HTN - Labile observe normal in office today  3. Palpitations:  Will give her 21 day event monitor seem benign likely PAC/PVC continue beta blocker     Medication Adjustments/Labs and Tests Ordered: Current medicines are reviewed at length with the patient today.  Concerns regarding medicines are outlined above.  Medication changes, Labs and Tests ordered today are listed in the Patient Instructions below. Patient Instructions  Postural Orthostatic Tachycardia Syndrome Postural orthostatic tachycardia syndrome (POTS) is a group of symptoms that can occur when you stand up after lying down. POTS happens when less blood flows to the heart than normal after you stand up. The reduced blood flow to the heart makes the heart beat rapidly. POTS may be associated with another medical condition, or it may occur on its own. What are the causes? This cause of this condition is not known, but many conditions and diseases have been linked to it. What increases the risk? This condition is more likely to develop in:  Women 88-70 years old.  Women who are pregnant.  Women who are menstruating.  People who have certain conditions, including: ? A viral infection. ? An autoimmune disease. ? Anemia. ? Dehydration. ? Hyperthyroidism.  People who take certain medicines.  People who have had a major injury.  People who have had surgery.  What are the signs or symptoms? The most common symptom of this condition is lightheadedness after standing up from a lying down position.  Other symptoms may include:  Feeling a rapid increase in the speed of the heartbeat (tachycardia) within 10 minutes of standing up.  Fainting.  Weakness.  Confusion.  Trembling.  Shortness of breath.  Sweating or flushing.  Headache.  Chest pain.  Breathing that is deeper and faster than normal (hyperventilation).  Nausea.  Anxiety.  Symptoms may be worse in the morning, and they may be relieved by lying down. How is this diagnosed? This condition is diagnosed based on:  Your symptoms.  Your medical history.  A physical exam.  Measurements of your heart rate when you are lying down and after you stand up.  A measurement of your blood pressure. The measurement will be taken when you go from lying down to standing up.  Blood tests to measure hormones that change with blood pressure. The blood tests will be done when you are lying down and standing up.  You may have other tests to check whether you have a condition or disease that is linked to POTS. How is this treated? Treatment for this condition depends on how severe your symptoms are and whether you have any conditions or diseases that have been linked to POTS. Treatment may involve:  Treating any conditions or diseases that have been linked to POTS.  Drinking two glasses of water before getting up from a lying position.  Eating more salt (sodium).  Taking medicine to control blood pressure and heart rate (beta-blocker).  Taking medicines to control blood flow, blood pressure, or heart rate.  Avoiding certain medicines.  Starting an exercise program under the supervision of your health care provider.  Follow these instructions at home:  Eating and drinking  Drink enough fluid to keep your urine clear or pale yellow.  If told by your health care provider, drink two glasses of water before getting up from a lying position.  Follow instructions from your health care provider about how much sodium you  should eat.  Eat several small meals a day instead of a few large meals.  Avoid heavy meals. Medicines  Take over-the-counter and prescription medicines only as told by your health care provider.  Talk with your health care provider before starting any new medicines. Activity  Do an aerobic exercise for 20 minutes a day, at least 3 days a week.  Ask your health care provider what kinds of exercise are safe for you. Contact a health care provider if:  Your symptoms do not improve after treatment.  Your symptoms get worse.  You develop new symptoms. Get help right away if:  You have chest pain.  You have difficulty breathing.  You have fainting episodes. This information is not intended to replace advice given to you by your health care provider. Make sure you discuss any questions you have with your health care provider. Document Released: 08/18/2002 Document Revised: 10/06/2015 Document Reviewed: 03/12/2015 Elsevier Interactive Patient Education  2018 [employer].   Your physician recommends that you schedule a follow-up appointment in: [place]  Event monitor ordered  Thank you for choosing [facility] !  Signed, Jenkins Rouge, MD  07/29/2019 3:13 PM    [place] [address] [place], [place] [address] Phone: [phone]

## 2019-08-14 ENCOUNTER — Telehealth: Payer: Self-pay | Admitting: *Deleted

## 2019-08-14 NOTE — Telephone Encounter (Signed)
[employer] to ship a 21 day cardiac event monitor to the patients home. Instructions included in the monitor kit.

## 2019-08-21 ENCOUNTER — Other Ambulatory Visit: Payer: Self-pay | Admitting: Cardiovascular Disease

## 2019-08-21 NOTE — Telephone Encounter (Signed)
This is a [place] pt.  °

## 2019-08-27 ENCOUNTER — Ambulatory Visit (INDEPENDENT_AMBULATORY_CARE_PROVIDER_SITE_OTHER): Payer: PRIVATE HEALTH INSURANCE

## 2019-08-27 DIAGNOSIS — R002 Palpitations: Secondary | ICD-10-CM | POA: Diagnosis not present

## 2019-09-15 ENCOUNTER — Ambulatory Visit: Payer: PRIVATE HEALTH INSURANCE | Admitting: Cardiovascular Disease

## 2019-10-09 ENCOUNTER — Other Ambulatory Visit: Payer: Self-pay

## 2019-10-09 ENCOUNTER — Encounter: Payer: Self-pay | Admitting: *Deleted

## 2019-10-09 ENCOUNTER — Ambulatory Visit (INDEPENDENT_AMBULATORY_CARE_PROVIDER_SITE_OTHER): Payer: Medicare Other | Admitting: *Deleted

## 2019-10-09 VITALS — Ht 66.0 in

## 2019-10-09 DIAGNOSIS — Z3042 Encounter for surveillance of injectable contraceptive: Secondary | ICD-10-CM

## 2019-10-09 DIAGNOSIS — Z308 Encounter for other contraceptive management: Secondary | ICD-10-CM

## 2019-10-09 MED ORDER — MEDROXYPROGESTERONE ACETATE 150 MG/ML IM SUSP
150.0000 mg | Freq: Once | INTRAMUSCULAR | Status: AC
  Administered 2019-10-09: 150 mg via INTRAMUSCULAR

## 2019-10-09 NOTE — Progress Notes (Signed)
   NURSE VISIT- INJECTION  SUBJECTIVE:  Kathy Zimmerman is a 29 y.o. [phone] female here for a Depo Provera for contraception/period management. She is a GYN patient.   OBJECTIVE:  Ht 5\' 6"  (1.676 m)   LMP 09/25/2019 (Approximate)   BMI 26.95 kg/m   Appears well, in no apparent distress  Injection administered in: Right deltoid  Meds ordered this encounter  Medications  . medroxyPROGESTERone (DEPO-PROVERA) injection 150 mg    ASSESSMENT: GYN patient Depo Provera for contraception/period management PLAN: Follow-up: in 11-13 weeks for next Depo   Levy Pupa  10/09/2019 10:46 AM

## 2019-10-23 ENCOUNTER — Telehealth: Payer: Self-pay | Admitting: *Deleted

## 2019-10-23 NOTE — Telephone Encounter (Signed)
Pt is out of town and forgot her Megace. I couldn't find the pharmacy that pt wants to use so pt was advised to call [facility] in [place] and see if they can transfer prescription to pharmacy out of town. Pt voiced understanding. [place]

## 2019-10-23 NOTE — Telephone Encounter (Signed)
Pt states that she is out of town in [place] and forgot her Megestrol Rx. Is currently bleeding and wants to know if we can send in prescription for her there.

## 2019-11-21 ENCOUNTER — Other Ambulatory Visit: Payer: Self-pay | Admitting: Cardiovascular Disease

## 2019-12-15 ENCOUNTER — Telehealth: Payer: Self-pay | Admitting: Women's Health

## 2019-12-15 NOTE — Telephone Encounter (Signed)
Left message that I am returning her call. 

## 2019-12-15 NOTE — Telephone Encounter (Signed)
Patient called, stated that she wants to know if the antibiotic she is taking will lessen the effects of the depo.  [phone]

## 2019-12-25 ENCOUNTER — Ambulatory Visit: Payer: PRIVATE HEALTH INSURANCE

## 2019-12-31 ENCOUNTER — Ambulatory Visit (INDEPENDENT_AMBULATORY_CARE_PROVIDER_SITE_OTHER): Payer: Medicare Other

## 2019-12-31 ENCOUNTER — Other Ambulatory Visit: Payer: Self-pay

## 2019-12-31 DIAGNOSIS — Z3042 Encounter for surveillance of injectable contraceptive: Secondary | ICD-10-CM | POA: Diagnosis not present

## 2019-12-31 MED ORDER — MEDROXYPROGESTERONE ACETATE 150 MG/ML IM SUSP
150.0000 mg | Freq: Once | INTRAMUSCULAR | Status: AC
  Administered 2019-12-31: 150 mg via INTRAMUSCULAR

## 2019-12-31 NOTE — Progress Notes (Signed)
   NURSE VISIT- INJECTION  SUBJECTIVE:  Kathy Zimmerman is a 29 y.o. [phone] female here for depo INJECTION  . She is a GYN patient.   OBJECTIVE:  There were no vitals taken for this visit.  Appears well, in no apparent distress  Injection administered in: Left deltoid    ASSESSMENT: GYN patient Depo Provera for contraception/period management PLAN: Follow-up: in 11-13 weeks for next Depo   Ladonna Snide  12/31/2019 3:52 PM

## 2020-01-14 ENCOUNTER — Other Ambulatory Visit: Payer: Self-pay | Admitting: Women's Health

## 2020-03-09 ENCOUNTER — Other Ambulatory Visit: Payer: Self-pay | Admitting: *Deleted

## 2020-03-09 ENCOUNTER — Telehealth: Payer: Self-pay | Admitting: Women's Health

## 2020-03-09 NOTE — Telephone Encounter (Signed)
Pharmacy [facility] [address] [place] [address] wants to make this her new pharmacy [facility] [place] nad wants to have it switched to pharmacy in [place].

## 2020-03-10 ENCOUNTER — Encounter: Payer: Self-pay | Admitting: *Deleted

## 2020-03-10 MED ORDER — MEDROXYPROGESTERONE ACETATE 150 MG/ML IM SUSP: 150.0000 mg | INTRAMUSCULAR | 3 refills | Status: DC

## 2020-03-18 ENCOUNTER — Other Ambulatory Visit: Payer: Self-pay

## 2020-03-18 ENCOUNTER — Other Ambulatory Visit: Payer: PRIVATE HEALTH INSURANCE | Admitting: Adult Health

## 2020-03-18 ENCOUNTER — Ambulatory Visit (INDEPENDENT_AMBULATORY_CARE_PROVIDER_SITE_OTHER): Payer: Medicare Other | Admitting: *Deleted

## 2020-03-18 DIAGNOSIS — Z3042 Encounter for surveillance of injectable contraceptive: Secondary | ICD-10-CM | POA: Diagnosis not present

## 2020-03-18 MED ORDER — MEDROXYPROGESTERONE ACETATE 150 MG/ML IM SUSP
150.0000 mg | Freq: Once | INTRAMUSCULAR | Status: AC
  Administered 2020-03-18: 150 mg via INTRAMUSCULAR

## 2020-03-18 NOTE — Progress Notes (Signed)
.     NURSE VISIT- INJECTION  SUBJECTIVE:  Kathy Zimmerman is a 29 y.o. [phone] female here for a Depo Provera for contraception/period management. She is a GYN patient.   OBJECTIVE:  There were no vitals taken for this visit.  Appears well, in no apparent distress  Injection administered in: Right deltoid  Meds ordered this encounter  Medications  . medroxyPROGESTERone (DEPO-PROVERA) injection 150 mg    ASSESSMENT: GYN patient Depo Provera for contraception/period management PLAN: Follow-up: in 11-13 weeks for next Depo   Alice Rieger  03/18/2020 4:47 PM

## 2020-04-19 ENCOUNTER — Other Ambulatory Visit: Payer: Self-pay

## 2020-04-19 ENCOUNTER — Ambulatory Visit (INDEPENDENT_AMBULATORY_CARE_PROVIDER_SITE_OTHER): Payer: Medicare Other | Admitting: Physician Assistant

## 2020-04-19 ENCOUNTER — Encounter: Payer: Self-pay | Admitting: Physician Assistant

## 2020-04-19 VITALS — BP 120/68 | HR 78 | Temp 98.7°F | Resp 18 | Ht 66.0 in | Wt 183.4 lb

## 2020-04-19 DIAGNOSIS — F4310 Post-traumatic stress disorder, unspecified: Secondary | ICD-10-CM | POA: Diagnosis not present

## 2020-04-19 DIAGNOSIS — F329 Major depressive disorder, single episode, unspecified: Secondary | ICD-10-CM | POA: Diagnosis not present

## 2020-04-19 DIAGNOSIS — F411 Generalized anxiety disorder: Secondary | ICD-10-CM | POA: Diagnosis not present

## 2020-04-19 DIAGNOSIS — F603 Borderline personality disorder: Secondary | ICD-10-CM | POA: Insufficient documentation

## 2020-04-19 DIAGNOSIS — J45909 Unspecified asthma, uncomplicated: Secondary | ICD-10-CM | POA: Insufficient documentation

## 2020-04-19 DIAGNOSIS — J029 Acute pharyngitis, unspecified: Secondary | ICD-10-CM | POA: Diagnosis not present

## 2020-04-19 DIAGNOSIS — Z3042 Encounter for surveillance of injectable contraceptive: Secondary | ICD-10-CM

## 2020-04-19 DIAGNOSIS — F32A Depression, unspecified: Secondary | ICD-10-CM | POA: Insufficient documentation

## 2020-04-19 LAB — POCT RAPID STREP A (OFFICE): Rapid Strep A Screen: NEGATIVE

## 2020-04-19 LAB — POCT MONO (EPSTEIN BARR VIRUS): Mono, POC: NEGATIVE

## 2020-04-19 MED ORDER — AMOXICILLIN 875 MG PO TABS
875.0000 mg | ORAL_TABLET | Freq: Two times a day (BID) | ORAL | 0 refills | Status: DC
Start: 2020-04-19 — End: 2020-05-14

## 2020-04-19 NOTE — Progress Notes (Signed)
Kathy Zimmerman is a 29 y.o. female here to establish care.   History of Present Illness:   Chief Complaint  Patient presents with  . New Patient (Initial Visit)    Left side throat pain that radiates to her left ear. She has been tested for covid (negative)  . Contraception    She would like for you to take over her depo shot. She has not had a pap smear done in 4 years.     HPI   Throat pain Last Wednesday developed sore throat. She is fully covid vaccinated. She was tested through work and this was negative. She has not taken any medication for her symptoms. They are worsening with time. Denies: fevers, chills, URI symptoms.  Contraception Last depo-provera shot was 03/18/20. She is doing well on this medication. Has been on depo-provera for a total of 3 years.  Anxiety; Depression; PTSD Has done multiple inpatient programs. Was on disability for this as well. Mental health issues started after she had her children. Currently seeing a psychiatrist and therapist regularly. Currently managed on: lithium 600 mg daily, effexor 187.5 mg daily, gabapentin 1800 mg nightly, buspar 10 mg BID.   Past Medical History:  Diagnosis Date  . Anxiety   . Asthma   . Depression   . Headache   . IBS (irritable bowel syndrome)   . Panic attack   . Preeclampsia      Social History   Tobacco Use  . Smoking status: Never Smoker  . Smokeless tobacco: Never Used  Vaping Use  . Vaping Use: Never used  Substance Use Topics  . Alcohol use: No  . Drug use: No    Past Surgical History:  Procedure Laterality Date  . CESAREAN SECTION N/A 04/10/2015   Procedure: CESAREAN SECTION;  Surgeon: Crawford Givens, MD;  Location: [facility];  Service: Obstetrics;  Laterality: N/A;  . EYE SURGERY     laser  . SCLERAL BUCKLE  08/09/2011   Procedure: SCLERAL BUCKLE;  Surgeon: Clent Demark Rankin;  Location: [facility];  Service: Ophthalmology;  Laterality: Left;  with cryo  . TENDON REPAIR     R middle finger     Family History  Problem Relation Age of Onset  . Depression Father   . Depression Sister   . Mental illness Sister   . Depression Maternal Grandmother   . Mental illness Maternal Grandmother   . Thyroid disease Maternal Grandmother   . Heart disease Maternal Grandfather   . Hypertension Maternal Grandfather   . Hyperlipidemia Maternal Grandfather   . CAD Maternal Grandfather   . Thyroid disease Other   . CAD Other   . Cancer Neg Hx     Allergies  Allergen Reactions  . Cat Hair Extract Anaphylaxis  . Shrimp [Shellfish Allergy] Anaphylaxis and Swelling  . Sulfasalazine Rash  . Sulfa Antibiotics Rash    Current Medications:   Current Outpatient Medications:  .  atenolol (TENORMIN) 25 MG tablet, Take 0.5 tablets (12.5 mg total) by mouth 2 (two) times daily., Disp: 90 tablet, Rfl: 2 .  busPIRone (BUSPAR) 10 MG tablet, Take 10 mg by mouth in the morning and at bedtime., Disp: , Rfl:  .  EPINEPHrine (EPIPEN 2-PAK) 0.3 mg/0.3 mL IJ SOAJ injection, Inject 0.3 mLs (0.3 mg total) into the muscle once as needed (for severe allergic reaction). CAll 911 immediately if you have to use this medicine, Disp: 1 Device, Rfl: 2 .  gabapentin (NEURONTIN) 600 MG tablet, Take 1,800  mg by mouth daily., Disp: , Rfl:  .  lithium 600 MG capsule, Take 600 mg by mouth daily., Disp: , Rfl:  .  medroxyPROGESTERone (DEPO-PROVERA) 150 MG/ML injection, Inject 1 mL (150 mg total) into the muscle every 3 (three) months., Disp: 1 mL, Rfl: 3 .  megestrol (MEGACE) 40 MG tablet, TAKE 3 TABS X 5 DAYS, 2 TABS X 5 DAYS, 1 TAB DAILY TO CONTROL VAG BLEEDING, STOP WHEN BLEEDING STOPS, Disp: 45 tablet, Rfl: 0 .  PROAIR HFA 108 (90 Base) MCG/ACT inhaler, Inhale 2 puffs into the lungs every 4 (four) hours as needed for wheezing. , Disp: , Rfl: 3 .  venlafaxine XR (EFFEXOR-XR) 150 MG 24 hr capsule, Take 150 mg by mouth daily., Disp: , Rfl:  .  venlafaxine XR (EFFEXOR-XR) 37.5 MG 24 hr capsule, Take 37.5 mg by mouth daily.,  Disp: , Rfl:  .  amoxicillin (AMOXIL) 875 MG tablet, Take 1 tablet (875 mg total) by mouth 2 (two) times daily., Disp: 20 tablet, Rfl: 0 .  diphenhydrAMINE (BENADRYL) 25 MG tablet, Take 1 tablet (25 mg total) by mouth every 6 (six) hours as needed for itching (Rash). (Patient taking differently: Take 50 mg by mouth 2 (two) times daily as needed for itching (Rash). ), Disp: 30 tablet, Rfl: 0   Review of Systems:   ROS  Negative unless otherwise specified per HPI.  Vitals:   Vitals:   04/19/20 1417  BP: 120/68  Pulse: 78  Resp: 18  Temp: 98.7 F (37.1 C)  TempSrc: Temporal  SpO2: 99%  Weight: 183 lb 6.4 oz (83.2 kg)  Height: 5\' 6"  (1.676 m)     Body mass index is 29.6 kg/m.  Physical Exam:   Physical Exam Vitals and nursing note reviewed.  Constitutional:      General: She is not in acute distress.    Appearance: She is well-developed. She is not ill-appearing or toxic-appearing.  HENT:     Head: Normocephalic and atraumatic.     Right Ear: Tympanic membrane, ear canal and external ear normal. Tympanic membrane is not erythematous, retracted or bulging.     Left Ear: Tympanic membrane, ear canal and external ear normal. Tympanic membrane is not erythematous, retracted or bulging.     Nose: Nose normal.     Right Sinus: No maxillary sinus tenderness or frontal sinus tenderness.     Left Sinus: No maxillary sinus tenderness or frontal sinus tenderness.     Mouth/Throat:     Pharynx: Uvula midline. No posterior oropharyngeal erythema.     Tonsils: 2+ on the right. 2+ on the left.     Comments: Small ulcerated lesion to upper left hard palate  Eyes:     General: Lids are normal.     Conjunctiva/sclera: Conjunctivae normal.  Neck:     Trachea: Trachea normal.  Cardiovascular:     Rate and Rhythm: Normal rate and regular rhythm.     Pulses: Normal pulses.     Heart sounds: Normal heart sounds, S1 normal and S2 normal.     Comments: No LE edema Pulmonary:     Effort:  Pulmonary effort is normal.     Breath sounds: Normal breath sounds. No decreased breath sounds, wheezing, rhonchi or rales.  Lymphadenopathy:     Cervical: No cervical adenopathy.  Skin:    General: Skin is warm and dry.  Neurological:     Mental Status: She is alert.     GCS: GCS eye subscore is  4. GCS verbal subscore is 5. GCS motor subscore is 6.  Psychiatric:        Speech: Speech normal.        Behavior: Behavior normal. Behavior is cooperative.     Results for orders placed or performed in visit on 04/19/20  POCT Mono (Epstein Barr Virus)  Result Value Ref Range   Mono, POC Negative Negative  POCT rapid strep A  Result Value Ref Range   Rapid Strep A Screen Negative Negative    Assessment and Plan:   Amiree was seen today for new patient (initial visit) and contraception.  Diagnoses and all orders for this visit:  Sore throat COVID negative (per health at work.) Negative Mono and Strep. Will treat for tonsillitis with oral amoxicillin per orders. Trial tylenol prn for pain (she has been instructed to avoid ibuprofen given current lithium dosage.) Follow if worsening symptoms. -     POCT Mono (Epstein Barr Virus) -     POCT rapid strep A  Reactive depression; GAD (generalized anxiety disorder); PTSD (post-traumatic stress disorder) I discussed with patient that if they develop any SI, to tell someone immediately and seek medical attention. Currently well managed per patient.  Contraception She would like to continue depo-provera. Discussed risks of long-term use, regarding bone health. She will think about alternatives in the interim, such as IUD.  Other orders -     amoxicillin (AMOXIL) 875 MG tablet; Take 1 tablet (875 mg total) by mouth 2 (two) times daily.  . Reviewed expectations re: course of current medical issues. . Discussed self-management of symptoms. . Outlined signs and symptoms indicating need for more acute intervention. . Patient verbalized  understanding and all questions were answered. . See orders for this visit as documented in the electronic medical record. . Patient received an After-Visit Summary.  CMA or LPN served as scribe during this visit. History, Physical, and Plan performed by medical provider. The above documentation has been reviewed and is accurate and complete.   [name], PA-C

## 2020-05-12 ENCOUNTER — Ambulatory Visit: Payer: Medicare Other | Admitting: Physician Assistant

## 2020-05-14 ENCOUNTER — Encounter: Payer: Self-pay | Admitting: Family Medicine

## 2020-05-14 ENCOUNTER — Ambulatory Visit (INDEPENDENT_AMBULATORY_CARE_PROVIDER_SITE_OTHER): Payer: Medicare Other | Admitting: Family Medicine

## 2020-05-14 ENCOUNTER — Other Ambulatory Visit: Payer: Self-pay

## 2020-05-14 VITALS — BP 147/90 | HR 86 | Temp 98.1°F | Ht 66.0 in | Wt 183.0 lb

## 2020-05-14 DIAGNOSIS — H8113 Benign paroxysmal vertigo, bilateral: Secondary | ICD-10-CM

## 2020-05-14 DIAGNOSIS — R350 Frequency of micturition: Secondary | ICD-10-CM

## 2020-05-14 LAB — POCT URINE PREGNANCY: Preg Test, Ur: NEGATIVE

## 2020-05-14 MED ORDER — MECLIZINE HCL 25 MG PO TABS: 25.0000 mg | ORAL_TABLET | Freq: Three times a day (TID) | ORAL | 0 refills | Status: DC | PRN

## 2020-05-14 NOTE — Progress Notes (Signed)
Patient: Kathy Zimmerman MRN: [id number] DOB: 1990-11-20 PCP: [name], PA     Subjective:  Chief Complaint  Patient presents with  . Dizziness  . Nausea    HPI: The patient is a 29 y.o. female who presents today for dizziness. She also complains of frequent urination. Symptoms of dizziness started around this AM when she woke up. She states her heart rate was elevated and when she set up she was dizzy. She states the world is spinning and she is standing still. She does have dizziness with keeping her still, but worse with movement to the right or left. She is nauseated, but no vomiting. She has no vision changes, palpitations, shortness of breath or chest pain. She has not taken anything over the counter. No new medication has been started. No trauma, no flying. She does have recurrent vertigo.    She does have some ear pain in her left ear, but no tinnitus/discharge.   Has also had some diarrhea, but no stomach pain or blood in stool. She gets increased diarrhea when her anxiety is worse.   Review of Systems  Constitutional: Negative for chills and fever.  HENT: Negative for congestion, sinus pressure and sinus pain.   Respiratory: Negative for cough and shortness of breath.   Cardiovascular: Negative for chest pain and palpitations.  Gastrointestinal: Positive for diarrhea and nausea. Negative for abdominal pain and vomiting.  Genitourinary: Negative for frequency and urgency.  Neurological: Positive for dizziness and headaches. Negative for facial asymmetry, weakness and numbness.    Allergies Patient is allergic to cat hair extract, shrimp [shellfish allergy], sulfasalazine, and sulfa antibiotics.  Past Medical History Patient  has a past medical history of Anxiety, Asthma, Depression, Headache, IBS (irritable bowel syndrome), Panic attack, and Preeclampsia.  Surgical History Patient  has a past surgical history that includes Tendon repair; Scleral buckle  (08/09/2011); Cesarean section (N/A, 04/10/2015); and Eye surgery.  Family History Pateint's family history includes CAD in her maternal grandfather and another family member; Depression in her father, maternal grandmother, and sister; Heart disease in her maternal grandfather; Hyperlipidemia in her maternal grandfather; Hypertension in her maternal grandfather; Mental illness in her maternal grandmother and sister; Thyroid disease in her maternal grandmother and another family member.  Social History Patient  reports that she has never smoked. She has never used smokeless tobacco. She reports that she does not drink alcohol and does not use drugs.    Objective: Vitals:   05/14/20 1404  BP: (!) 147/90  Pulse: 86  Temp: 98.1 F (36.7 C)  TempSrc: Temporal  SpO2: 97%  Weight: 183 lb (83 kg)  Height: 5\' 6"  (1.676 m)    Body mass index is 29.54 kg/m.  Physical Exam Constitutional:      Appearance: She is normal weight.  HENT:     Head: Normocephalic and atraumatic.     Right Ear: Tympanic membrane, ear canal and external ear normal.     Left Ear: Tympanic membrane, ear canal and external ear normal.     Nose: Nose normal.     Mouth/Throat:     Mouth: Mucous membranes are moist.     Comments: 3+tonsils bilaterally  Eyes:     Extraocular Movements: Extraocular movements intact.     Conjunctiva/sclera: Conjunctivae normal.     Pupils: Pupils are equal, round, and reactive to light.  Cardiovascular:     Rate and Rhythm: Normal rate and regular rhythm.     Heart sounds: Normal heart sounds.  Pulmonary:     Effort: Pulmonary effort is normal.     Breath sounds: Normal breath sounds.  Abdominal:     General: Abdomen is flat. Bowel sounds are normal.     Palpations: Abdomen is soft.  Musculoskeletal:     Cervical back: Normal range of motion and neck supple.  Lymphadenopathy:     Cervical: No cervical adenopathy.  Skin:    General: Skin is warm.  Neurological:     General:  No focal deficit present.     Mental Status: She is oriented to person, place, and time.     Cranial Nerves: No cranial nerve deficit.     Sensory: No sensory deficit.     Motor: No weakness.     Coordination: Coordination normal.     Comments: Positive dix halpike bilaterally. L>R  Psychiatric:        Mood and Affect: Mood normal.        Behavior: Behavior normal.    Urine pregnancy: negative     Assessment/plan: 1. BPPV (benign paroxysmal positional vertigo), bilateral Home exercises given to do TID as well as sent in meclizine to pick up in [place] when she gets off airplane. I wonder if drop in barometric pressure triggered this with rapid change in temperature. Declines anything for nausea. Orthostatics were wnl and repeat bp much better. Discussed if not better in 2 weeks will need to MRI so let me know. Would also check labs/lithium level.      This visit occurred during the SARS-CoV-2 public health emergency.  Safety protocols were in place, including screening questions prior to the visit, additional usage of staff PPE, and extensive cleaning of exam room while observing appropriate contact time as indicated for disinfecting solutions.     Return if symptoms worsen or fail to improve.  Orma Flaming, MD [place]   05/14/2020

## 2020-05-14 NOTE — Patient Instructions (Signed)
Sending in medication for vertigo/dizziness called meclizine. Would take this scheduled for the first day or two and then as needed.   If not better in 2 weeks then we need to MRI and I would check labs/lithium level.

## 2020-05-24 ENCOUNTER — Encounter: Payer: PRIVATE HEALTH INSURANCE | Admitting: Physician Assistant

## 2020-05-25 ENCOUNTER — Other Ambulatory Visit: Payer: Self-pay

## 2020-05-25 ENCOUNTER — Encounter: Payer: Self-pay | Admitting: Physician Assistant

## 2020-05-25 ENCOUNTER — Ambulatory Visit (INDEPENDENT_AMBULATORY_CARE_PROVIDER_SITE_OTHER): Payer: Medicare Other | Admitting: Physician Assistant

## 2020-05-25 VITALS — BP 130/90 | HR 75 | Temp 98.7°F | Ht 66.0 in | Wt 187.5 lb

## 2020-05-25 DIAGNOSIS — R739 Hyperglycemia, unspecified: Secondary | ICD-10-CM | POA: Diagnosis not present

## 2020-05-25 DIAGNOSIS — Z13220 Encounter for screening for lipoid disorders: Secondary | ICD-10-CM | POA: Diagnosis not present

## 2020-05-25 DIAGNOSIS — R635 Abnormal weight gain: Secondary | ICD-10-CM

## 2020-05-25 DIAGNOSIS — Z136 Encounter for screening for cardiovascular disorders: Secondary | ICD-10-CM | POA: Diagnosis not present

## 2020-05-25 DIAGNOSIS — Z3009 Encounter for other general counseling and advice on contraception: Secondary | ICD-10-CM

## 2020-05-25 DIAGNOSIS — Z79899 Other long term (current) drug therapy: Secondary | ICD-10-CM | POA: Diagnosis not present

## 2020-05-25 DIAGNOSIS — F411 Generalized anxiety disorder: Secondary | ICD-10-CM

## 2020-05-25 DIAGNOSIS — E669 Obesity, unspecified: Secondary | ICD-10-CM

## 2020-05-25 DIAGNOSIS — Z1159 Encounter for screening for other viral diseases: Secondary | ICD-10-CM | POA: Diagnosis not present

## 2020-05-25 DIAGNOSIS — Z0001 Encounter for general adult medical examination with abnormal findings: Secondary | ICD-10-CM

## 2020-05-25 LAB — POCT URINE PREGNANCY: Preg Test, Ur: NEGATIVE

## 2020-05-25 NOTE — Progress Notes (Signed)
I acted as a Education administrator for [employer], PA-C Anselmo Pickler, LPN   Subjective:    Kathy Zimmerman is a 29 y.o. female and is here for a comprehensive physical exam.  HPI  Health Maintenance Due  Topic Date Due  . Hepatitis C Screening  Never done  . PAP-Cervical Cytology Screening  12/01/2017  . PAP SMEAR-Modifier  12/01/2017   Acute Concerns: None  Chronic Issues: GAD -- managed by psych. Feels like she is the best that she has ever been since her symptoms started. Contraception -- would like to pursue surgery for contraception; had severe preeclampsia and significant PPD with prior pregnancies. Currently on depo-provera. Weight gain -- feels like this is due to improved mood and possibly her psych medications. Was given metformin 500 mg by her psychiatrist, she took for about 1 month and did not have significant results with this. Does have hx of hyperglycemia on a prior lab draw, and is on some w  Wt Readings from Last 10 Encounters:  05/25/20 187 lb 8 oz (85 kg)  05/14/20 183 lb (83 kg)  04/19/20 183 lb 6.4 oz (83.2 kg)  07/29/19 167 lb (75.8 kg)  07/17/19 163 lb (73.9 kg)  04/29/19 154 lb 12.8 oz (70.2 kg)  01/26/19 162 lb 4 oz (73.6 kg)  09/07/18 150 lb (68 kg)  08/20/18 151 lb (68.5 kg)  08/15/18 151 lb (68.5 kg)    Health Maintenance: Immunizations -- UTD Colonoscopy -- N/A Mammogram -- N/A PAP -- overdue, will defer today Bone Density -- N/A Diet -- doesn't eat regular meals due to busy lifestyle Caffeine intake -- limited Sleep habits -- no major concerns Exercise -- limited Current Weight -- Weight: 187 lb 8 oz (85 kg)  Weight History: Wt Readings from Last 10 Encounters:  05/25/20 187 lb 8 oz (85 kg)  05/14/20 183 lb (83 kg)  04/19/20 183 lb 6.4 oz (83.2 kg)  07/29/19 167 lb (75.8 kg)  07/17/19 163 lb (73.9 kg)  04/29/19 154 lb 12.8 oz (70.2 kg)  01/26/19 162 lb 4 oz (73.6 kg)  09/07/18 150 lb (68 kg)  08/20/18 151 lb (68.5 kg)  08/15/18  151 lb (68.5 kg)   Body mass index is 30.26 kg/m. Mood -- no concerns today No LMP recorded. Patient has had an injection. Period characteristics -- irregular due to depo-provera Birth control -- depo-provera  Depression screen [facility] 2/9 05/25/2020  Decreased Interest 0  Down, Depressed, Hopeless 0  PHQ - 2 Score 0  Altered sleeping 1  Tired, decreased energy 2  Change in appetite 0  Feeling bad or failure about yourself  0  Trouble concentrating 0  Moving slowly or fidgety/restless 0  Suicidal thoughts 0  PHQ-9 Score 3  Difficult doing work/chores Not difficult at all     Other providers/specialists: Patient Care Team: [name], [place] as PCP - General (Physician Assistant) Josue Hector, MD as PCP - Cardiology (Cardiology)   PMHx, SurgHx, SocialHx, Medications, and Allergies were reviewed in the Visit Navigator and updated as appropriate.   Past Medical History:  Diagnosis Date  . Anxiety   . Asthma   . Depression   . Headache   . IBS (irritable bowel syndrome)   . Panic attack   . Preeclampsia      Past Surgical History:  Procedure Laterality Date  . CESAREAN SECTION N/A 04/10/2015   Procedure: CESAREAN SECTION;  Surgeon: Crawford Givens, MD;  Location: [facility];  Service: Obstetrics;  Laterality: N/A;  . EYE SURGERY     laser  . SCLERAL BUCKLE  08/09/2011   Procedure: SCLERAL BUCKLE;  Surgeon: Clent Demark Rankin;  Location: [facility];  Service: Ophthalmology;  Laterality: Left;  with cryo  . TENDON REPAIR     R middle finger     Family History  Problem Relation Age of Onset  . Depression Father   . Depression Sister   . Mental illness Sister   . Depression Maternal Grandmother   . Mental illness Maternal Grandmother   . Thyroid disease Maternal Grandmother   . Heart disease Maternal Grandfather   . Hypertension Maternal Grandfather   . Hyperlipidemia Maternal Grandfather   . CAD Maternal Grandfather   . Thyroid disease Other   . CAD Other   . Cancer Neg  Hx     Social History   Tobacco Use  . Smoking status: Never Smoker  . Smokeless tobacco: Never Used  Vaping Use  . Vaping Use: Never used  Substance Use Topics  . Alcohol use: No  . Drug use: No    Review of Systems:   Review of Systems  Constitutional: Negative for chills, fever, malaise/fatigue and weight loss.  HENT: Negative for hearing loss, sinus pain and sore throat.   Respiratory: Negative for cough and hemoptysis.   Cardiovascular: Negative for chest pain, palpitations, leg swelling and PND.  Gastrointestinal: Negative for abdominal pain, constipation, diarrhea, heartburn, nausea and vomiting.  Genitourinary: Negative for dysuria, frequency and urgency.  Musculoskeletal: Negative for back pain, myalgias and neck pain.  Skin: Negative for itching and rash.  Neurological: Negative for dizziness, tingling, seizures and headaches.  Endo/Heme/Allergies: Negative for polydipsia.  Psychiatric/Behavioral: Negative for depression. The patient is not nervous/anxious.     Objective:   BP 130/90 (BP Location: Left Arm, Patient Position: Sitting, Cuff Size: Normal)   Pulse 75   Temp 98.7 F (37.1 C) (Temporal)   Ht 5\' 6"  (1.676 m)   Wt 187 lb 8 oz (85 kg)   SpO2 98%   BMI 30.26 kg/m   General Appearance:    Alert, cooperative, no distress, appears stated age  Head:    Normocephalic, without obvious abnormality, atraumatic  Eyes:    PERRL, conjunctiva/corneas clear, EOM's intact, fundi    benign, both eyes  Ears:    Normal TM's and external ear canals, both ears  Nose:   Nares normal, septum midline, mucosa normal, no drainage    or sinus tenderness  Throat:   Lips, mucosa, and tongue normal; teeth and gums normal  Neck:   Supple, symmetrical, trachea midline, no adenopathy;    thyroid:  no enlargement/tenderness/nodules; no carotid   bruit or JVD  Back:     Symmetric, no curvature, ROM normal, no CVA tenderness  Lungs:     Clear to auscultation bilaterally,  respirations unlabored  Chest Wall:    No tenderness or deformity   Heart:    Regular rate and rhythm, S1 and S2 normal, no murmur, rub   or gallop  Breast Exam:    Deferred  Abdomen:     Soft, non-tender, bowel sounds active all four quadrants,    no masses, no organomegaly  Genitalia:    Deferred  Rectal:    Deferred  Extremities:   Extremities normal, atraumatic, no cyanosis or edema  Pulses:   2+ and symmetric all extremities  Skin:   Skin color, texture, turgor normal, no rashes or lesions  Lymph nodes:  Cervical, supraclavicular, and axillary nodes normal  Neurologic:   CNII-XII intact, normal strength, sensation and reflexes    throughout   Results for orders placed or performed in visit on 05/25/20  POCT urine pregnancy  Result Value Ref Range   Preg Test, Ur Negative Negative    Assessment/Plan:   Kathy Zimmerman was seen today for annual exam.  Diagnoses and all orders for this visit:  Encounter for general adult medical examination with abnormal findings Today patient counseled on age appropriate routine health concerns for screening and prevention, each reviewed and up to date or declined. Immunizations reviewed and up to date or declined. Labs ordered and reviewed. Risk factors for depression reviewed and negative. Hearing function and visual acuity are intact. ADLs screened and addressed as needed. Functional ability and level of safety reviewed and appropriate. Education, counseling and referrals performed based on assessed risks today. Patient provided with a copy of personalized plan for preventive services.  Weight gain; Hyperglycemia; Obesity Possibly medication related and lifestyle related. Update labs to assess for other cause. Consider medications such as metformin or saxenda. Encouraged working on her lifestyle as able. Continue to monitor, f/u in 1-2 mo. -     CBC with Differential/Platelet; Future -     Comprehensive metabolic panel; Future -     POCT urine  pregnancy -     TSH; Future -     TSH -     Comprehensive metabolic panel -     CBC with Differential/Platelet -     Hemoglobin A1c; Future  Encounter for lipid screening for cardiovascular disease -     Lipid panel; Future -     Lipid panel  Encounter for screening for other viral diseases -     Hepatitis C antibody; Future -     Hepatitis C antibody  Encounter for other general counseling or advice on contraception -     Ambulatory referral to Gynecology  GAD (generalized anxiety disorder) Mgmt per psych.  Well Adult Exam: Labs ordered: Yes. Patient counseling was done. See below for items discussed. Discussed the patient's BMI. The BMI is not in the acceptable range; BMI management plan is completed Follow up in 1-2 mo.  Patient Counseling:   [x]     Nutrition: Stressed importance of moderation in sodium/caffeine intake, saturated fat and cholesterol, caloric balance, sufficient intake of fresh fruits, vegetables, fiber, calcium, iron, and 1 mg of folate supplement per day (for females capable of pregnancy).   [x]      Stressed the importance of regular exercise.    [x]     Substance Abuse: Discussed cessation/primary prevention of tobacco, alcohol, or other drug use; driving or other dangerous activities under the influence; availability of treatment for abuse.    [x]      Injury prevention: Discussed safety belts, safety helmets, smoke detector, smoking near bedding or upholstery.    [x]      Sexuality: Discussed sexually transmitted diseases, partner selection, use of condoms, avoidance of unintended pregnancy  and contraceptive alternatives.    [x]     Dental health: Discussed importance of regular tooth brushing, flossing, and dental visits.   [x]      Health maintenance and immunizations reviewed. Please refer to Health maintenance section.   CMA or LPN served as scribe during this visit. History, Physical, and Plan performed by medical provider. The above  documentation has been reviewed and is accurate and complete.   [name], PA-C [place]

## 2020-05-26 LAB — HEMOGLOBIN A1C
Hgb A1c MFr Bld: 5 % of total Hgb (ref <5.7)
Mean Plasma Glucose: 97 (calc)
eAG (mmol/L): 5.4 (calc)

## 2020-05-26 NOTE — Addendum Note (Signed)
Addended by: Erlene Quan on: 05/26/2020 04:30 PM   Modules accepted: Orders

## 2020-05-28 LAB — CBC WITH DIFFERENTIAL/PLATELET
Absolute Monocytes: 760 cells/uL (ref 200–950)
Basophils Absolute: 60 cells/uL (ref 0–200)
Basophils Relative: 0.6 %
Eosinophils Absolute: 120 cells/uL (ref 15–500)
Eosinophils Relative: 1.2 %
HCT: 39.8 % (ref 35.0–45.0)
Hemoglobin: 13.3 g/dL (ref 11.7–15.5)
Lymphs Abs: 2420 cells/uL (ref 850–3900)
MCH: 29.2 pg (ref 27.0–33.0)
MCHC: 33.4 g/dL (ref 32.0–36.0)
MCV: 87.5 fL (ref 80.0–100.0)
MPV: 12 fL (ref 7.5–12.5)
Monocytes Relative: 7.6 %
Neutro Abs: 6640 cells/uL (ref 1500–7800)
Neutrophils Relative %: 66.4 %
Platelets: 273 10*3/uL (ref 140–400)
RBC: 4.55 10*6/uL (ref 3.80–5.10)
RDW: 13 % (ref 11.0–15.0)
Total Lymphocyte: 24.2 %
WBC: 10 10*3/uL (ref 3.8–10.8)

## 2020-05-28 LAB — COMPREHENSIVE METABOLIC PANEL
AG Ratio: 1.4 (calc) (ref 1.0–2.5)
ALT: 27 U/L (ref 6–29)
AST: 17 U/L (ref 10–30)
Albumin: 4.5 g/dL (ref 3.6–5.1)
Alkaline phosphatase (APISO): 64 U/L (ref 31–125)
BUN: 11 mg/dL (ref 7–25)
CO2: 20 mmol/L (ref 20–32)
Calcium: 9.6 mg/dL (ref 8.6–10.2)
Chloride: 103 mmol/L (ref 98–110)
Creat: 0.76 mg/dL (ref 0.50–1.10)
Globulin: 3.3 g/dL (calc) (ref 1.9–3.7)
Glucose, Bld: 79 mg/dL (ref 65–99)
Potassium: 4.5 mmol/L (ref 3.5–5.3)
Sodium: 137 mmol/L (ref 135–146)
Total Bilirubin: 0.4 mg/dL (ref 0.2–1.2)
Total Protein: 7.8 g/dL (ref 6.1–8.1)

## 2020-05-28 LAB — TEST AUTHORIZATION

## 2020-05-28 LAB — LIPID PANEL
Cholesterol: 215 mg/dL — ABNORMAL HIGH (ref <200)
HDL: 39 mg/dL — ABNORMAL LOW (ref >=50)
LDL Cholesterol (Calc): 133 mg/dL (calc) — ABNORMAL HIGH
Non-HDL Cholesterol (Calc): 176 mg/dL (calc) — ABNORMAL HIGH (ref <130)
Total CHOL/HDL Ratio: 5.5 (calc) — ABNORMAL HIGH (ref <5.0)
Triglycerides: 280 mg/dL — ABNORMAL HIGH (ref <150)

## 2020-05-28 LAB — LITHIUM LEVEL: Lithium Lvl: 0.3 mmol/L — ABNORMAL LOW (ref 0.6–1.2)

## 2020-05-28 LAB — HEPATITIS C ANTIBODY
Hepatitis C Ab: NONREACTIVE
SIGNAL TO CUT-OFF: 0.01 (ref <1.00)

## 2020-05-28 LAB — TSH: TSH: 1.57 mIU/L

## 2020-06-03 ENCOUNTER — Encounter: Payer: Self-pay | Admitting: Obstetrics and Gynecology

## 2020-06-09 ENCOUNTER — Ambulatory Visit (HOSPITAL_COMMUNITY)
Admission: RE | Admit: 2020-06-09 | Discharge: 2020-06-09 | Disposition: A | Discharge status: Home / Self Care | Payer: PRIVATE HEALTH INSURANCE | Source: Ambulatory Visit | Attending: Physician Assistant | Admitting: Physician Assistant

## 2020-06-09 ENCOUNTER — Ambulatory Visit (INDEPENDENT_AMBULATORY_CARE_PROVIDER_SITE_OTHER): Payer: Medicare Other | Admitting: Physician Assistant

## 2020-06-09 ENCOUNTER — Other Ambulatory Visit: Payer: Self-pay | Admitting: Physician Assistant

## 2020-06-09 ENCOUNTER — Other Ambulatory Visit: Payer: Self-pay

## 2020-06-09 ENCOUNTER — Encounter: Payer: Self-pay | Admitting: Physician Assistant

## 2020-06-09 VITALS — BP 130/90 | HR 68 | Temp 98.3°F | Ht 66.0 in | Wt 182.0 lb

## 2020-06-09 DIAGNOSIS — R10826 Epigastric rebound abdominal tenderness: Secondary | ICD-10-CM | POA: Insufficient documentation

## 2020-06-09 DIAGNOSIS — R82998 Other abnormal findings in urine: Secondary | ICD-10-CM | POA: Diagnosis not present

## 2020-06-09 DIAGNOSIS — R112 Nausea with vomiting, unspecified: Secondary | ICD-10-CM | POA: Insufficient documentation

## 2020-06-09 LAB — POCT URINALYSIS DIPSTICK
Bilirubin, UA: NEGATIVE
Blood, UA: NEGATIVE
Glucose, UA: NEGATIVE
Ketones, UA: NEGATIVE
Nitrite, UA: NEGATIVE
Protein, UA: NEGATIVE
Spec Grav, UA: 1.025 (ref 1.010–1.025)
Urobilinogen, UA: 0.2 E.U./dL
pH, UA: 6 (ref 5.0–8.0)

## 2020-06-09 MED ORDER — IOHEXOL 300 MG/ML  SOLN
100.0000 mL | Freq: Once | INTRAMUSCULAR | Status: AC | PRN
  Administered 2020-06-09: 100 mL via INTRAVENOUS

## 2020-06-09 MED ORDER — AZITHROMYCIN 250 MG PO TABS: ORAL_TABLET | ORAL | 0 refills | Status: DC

## 2020-06-09 MED ORDER — IOHEXOL 9 MG/ML PO SOLN
500.0000 mL | ORAL | Status: AC
  Administered 2020-06-09 (×2): 500 mL via ORAL

## 2020-06-09 MED ORDER — ONDANSETRON HCL 4 MG PO TABS: 4.0000 mg | ORAL_TABLET | Freq: Three times a day (TID) | ORAL | 0 refills | Status: DC | PRN

## 2020-06-09 MED ORDER — IOHEXOL 9 MG/ML PO SOLN
ORAL | Status: AC
  Filled 2020-06-09: qty 1000

## 2020-06-09 NOTE — Patient Instructions (Signed)
It was great to see you!  Stop Saxenda.  Labs now.  CT scan ASAP.  If worsening symptoms --> please go to the ER.  Many things can cause belly (abdominal) pain. Most times, belly pain is not dangerous. Many cases of belly pain can be watched and treated at home. Sometimes belly pain is serious, though. Your doctor will try to find the cause of your belly pain.   Follow these instructions at home:  Take over-the-counter and prescription medicines only as told by your doctor. Do not take medicines that help you poop (laxatives) unless told to by your doctor.  Drink enough fluid to keep your pee (urine) clear or pale yellow.  Watch your belly pain for any changes.  Keep all follow-up visits as told by your doctor. This is important.  Contact a doctor if:  Your belly pain changes or gets worse.  You are not hungry, or you lose weight without trying.  You are having trouble pooping (constipated) or have watery poop (diarrhea) for more than 2-3 days.  You have pain when you pee or poop.  Your belly pain wakes you up at night.  Your pain gets worse with meals, after eating, or with certain foods.  You are throwing up and cannot keep anything down.  You have a fever.  Get help right away if:  Your pain does not go away as soon as your doctor says it should.  You cannot stop throwing up.  Your pain is only in areas of your belly, such as the right side or the left lower part of the belly.  You have bloody or black poop, or poop that looks like tar.  You have very bad pain, cramping, or bloating in your belly.  You have signs of not having enough fluid or water in your body (dehydration), such as: ? Dark pee, very little pee, or no pee. ? Cracked lips. ? Dry mouth. ? Sunken eyes. ? Sleepiness. ? Weakness.

## 2020-06-09 NOTE — Progress Notes (Signed)
Kathy Zimmerman is a 29 y.o. female here for a new problem.  I acted as a Education administrator for [employer], PA-C [employer], LPN   History of Present Illness:   Chief Complaint  Patient presents with   Stomach problems    HPI   Stomach problem Pt started with nausea, vomiting and diarrhea yesterday morning. Pt has vomited 3 x's, diarrhea x's 3 also. No fever or chills. C/o headache, denies body aches, rectal bleeding.  She is having severe epigastric pain. Cannot get comfortable sitting. Denies hematuria or rectal bleeding, CP, SOB.  Only new change is she started Saxenda around 05/27/20  Had somewhat similar episode in April 2020 and had WBC of 19 and otherwise negative work-up of RUQ u/s and CT abd. Was put on bentyl and zofran and symptoms eventually resolved   Wt Readings from Last 4 Encounters:  06/09/20 182 lb (82.6 kg)  05/25/20 187 lb 8 oz (85 kg)  05/14/20 183 lb (83 kg)  04/19/20 183 lb 6.4 oz (83.2 kg)     Past Medical History:  Diagnosis Date   Anxiety    Asthma    Depression    Headache    IBS (irritable bowel syndrome)    Panic attack    Preeclampsia      Social History   Tobacco Use   Smoking status: Never Smoker   Smokeless tobacco: Never Used  Vaping Use   Vaping Use: Never used  Substance Use Topics   Alcohol use: No   Drug use: No    Past Surgical History:  Procedure Laterality Date   CESAREAN SECTION N/A 04/10/2015   Procedure: CESAREAN SECTION;  Surgeon: Crawford Givens, MD;  Location: [facility];  Service: Obstetrics;  Laterality: N/A;   EYE SURGERY     laser   SCLERAL BUCKLE  08/09/2011   Procedure: SCLERAL BUCKLE;  Surgeon: Clent Demark Rankin;  Location: [facility];  Service: Ophthalmology;  Laterality: Left;  with cryo   TENDON REPAIR     R middle finger    Family History  Problem Relation Age of Onset   Depression Father    Depression Sister    Mental illness Sister    Depression Maternal Grandmother    Mental  illness Maternal Grandmother    Thyroid disease Maternal Grandmother    Heart disease Maternal Grandfather    Hypertension Maternal Grandfather    Hyperlipidemia Maternal Grandfather    CAD Maternal Grandfather    Thyroid disease Other    CAD Other    Cancer Neg Hx     Allergies  Allergen Reactions   Cat Hair Extract Anaphylaxis   Shrimp [Shellfish Allergy] Anaphylaxis and Swelling   Sulfasalazine Rash   Sulfa Antibiotics Rash    Current Medications:   Current Outpatient Medications:    atenolol (TENORMIN) 25 MG tablet, Take 0.5 tablets (12.5 mg total) by mouth 2 (two) times daily., Disp: 90 tablet, Rfl: 2   busPIRone (BUSPAR) 10 MG tablet, Take 10 mg by mouth in the morning and at bedtime., Disp: , Rfl:    EPINEPHrine (EPIPEN 2-PAK) 0.3 mg/0.3 mL IJ SOAJ injection, Inject 0.3 mLs (0.3 mg total) into the muscle once as needed (for severe allergic reaction). CAll 911 immediately if you have to use this medicine, Disp: 1 Device, Rfl: 2   gabapentin (NEURONTIN) 600 MG tablet, Take 1,800 mg by mouth daily., Disp: , Rfl:    Liraglutide -Weight Management (SAXENDA) 18 MG/3ML SOPN, Inject 1.2 mg into the  skin daily., Disp: , Rfl:    lithium 600 MG capsule, Take 600 mg by mouth daily., Disp: , Rfl:    meclizine (ANTIVERT) 25 MG tablet, Take 1 tablet (25 mg total) by mouth 3 (three) times daily as needed for dizziness., Disp: 30 tablet, Rfl: 0   medroxyPROGESTERone (DEPO-PROVERA) 150 MG/ML injection, Inject 1 mL (150 mg total) into the muscle every 3 (three) months., Disp: 1 mL, Rfl: 3   megestrol (MEGACE) 40 MG tablet, TAKE 3 TABS X 5 DAYS, 2 TABS X 5 DAYS, 1 TAB DAILY TO CONTROL VAG BLEEDING, STOP WHEN BLEEDING STOPS, Disp: 45 tablet, Rfl: 0   PROAIR HFA 108 (90 Base) MCG/ACT inhaler, Inhale 2 puffs into the lungs every 4 (four) hours as needed for wheezing. , Disp: , Rfl: 3   venlafaxine XR (EFFEXOR-XR) 150 MG 24 hr capsule, Take 150 mg by mouth daily., Disp: , Rfl:     venlafaxine XR (EFFEXOR-XR) 37.5 MG 24 hr capsule, Take 37.5 mg by mouth daily., Disp: , Rfl:  No current facility-administered medications for this visit.  Facility-Administered Medications Ordered in Other Visits:    iohexol (OMNIPAQUE) 9 MG/ML oral solution, , , ,    Review of Systems:   ROS  Negative unless otherwise specified per HPI.  Vitals:   Vitals:   06/09/20 1128  BP: 130/90  Pulse: 68  Temp: 98.3 F (36.8 C)  TempSrc: Temporal  SpO2: 98%  Weight: 182 lb (82.6 kg)  Height: 5\' 6"  (1.676 m)     Body mass index is 29.38 kg/m.  Physical Exam:   Physical Exam Vitals and nursing note reviewed.  Constitutional:      General: She is not in acute distress.    Appearance: She is well-developed. She is not ill-appearing or toxic-appearing.  Cardiovascular:     Rate and Rhythm: Normal rate and regular rhythm.     Pulses: Normal pulses.     Heart sounds: Normal heart sounds, S1 normal and S2 normal.     Comments: No LE edema Pulmonary:     Effort: Pulmonary effort is normal.     Breath sounds: Normal breath sounds.  Abdominal:     General: Abdomen is flat. Bowel sounds are normal.     Palpations: Abdomen is soft.     Tenderness: There is abdominal tenderness in the epigastric area. There is guarding and rebound. There is no right CVA tenderness or left CVA tenderness.  Skin:    General: Skin is warm and dry.  Neurological:     Mental Status: She is alert.     GCS: GCS eye subscore is 4. GCS verbal subscore is 5. GCS motor subscore is 6.  Psychiatric:        Speech: Speech normal.        Behavior: Behavior normal. Behavior is cooperative.     Results for orders placed or performed in visit on 06/09/20  POCT urinalysis dipstick  Result Value Ref Range   Color, UA Yellow    Clarity, UA slight cloudy    Glucose, UA Negative Negative   Bilirubin, UA Negative    Ketones, UA Negative    Spec Grav, UA 1.025 1.010 - 1.025   Blood, UA Negative    pH, UA  6.0 5.0 - 8.0   Protein, UA Negative Negative   Urobilinogen, UA 0.2 0.2 or 1.0 E.U./dL   Nitrite, UA Negative    Leukocytes, UA Small (1+) (A) Negative   Appearance  Odor      Assessment and Plan:   Kathy Zimmerman was seen today for stomach problems.  Diagnoses and all orders for this visit:  Nausea and vomiting, intractability of vomiting not specified, unspecified vomiting type; Epigastric abdominal tenderness with rebound tenderness  Very tender abdomen on exam. Order stat labs and abdominal CT. Recommended that she stop [place]. Literature does suggest small chance of acute pancreatitis with this medication. DDx includes: gastric ulcer, gastritis, pancreatitis, IBS, among others Recommend NPO status until CT scan. Worsening pain or new symptoms requires ER evaluation. -     POCT urinalysis dipstick -     Cancel: CBC with Differential/Platelet; Future -     Cancel: Comprehensive metabolic panel; Future -     Cancel: Lipase; Future -     CT ABDOMEN PELVIS W CONTRAST; Future -     CBC with Differential/Platelet; Future -     Comprehensive metabolic panel; Future -     Lipase; Future -     Urine Culture; Future -     Urine Culture -     CBC with Differential/Platelet -     Comprehensive metabolic panel -     Lipase -     CT ABDOMEN PELVIS W CONTRAST; Future  Leukocytes in urine Asymptomatic. Will obtain culture for further evaluation. -     Urine Culture; Future -     Urine Culture  CMA or LPN served as scribe during this visit. History, Physical, and Plan performed by medical provider. The above documentation has been reviewed and is accurate and complete.  [name], PA-C

## 2020-06-11 LAB — URINE CULTURE
MICRO NUMBER:: 11010117
SPECIMEN QUALITY:: ADEQUATE

## 2020-06-11 LAB — CBC WITH DIFFERENTIAL/PLATELET
Absolute Monocytes: 593 cells/uL (ref 200–950)
Basophils Absolute: 52 cells/uL (ref 0–200)
Basophils Relative: 0.6 %
Eosinophils Absolute: 120 cells/uL (ref 15–500)
Eosinophils Relative: 1.4 %
HCT: 40.6 % (ref 35.0–45.0)
Hemoglobin: 13.6 g/dL (ref 11.7–15.5)
Lymphs Abs: 2305 cells/uL (ref 850–3900)
MCH: 29.1 pg (ref 27.0–33.0)
MCHC: 33.5 g/dL (ref 32.0–36.0)
MCV: 86.9 fL (ref 80.0–100.0)
MPV: 11.9 fL (ref 7.5–12.5)
Monocytes Relative: 6.9 %
Neutro Abs: 5478 cells/uL (ref 1500–7800)
Neutrophils Relative %: 63.7 %
Platelets: 270 10*3/uL (ref 140–400)
RBC: 4.67 10*6/uL (ref 3.80–5.10)
RDW: 13.2 % (ref 11.0–15.0)
Total Lymphocyte: 26.8 %
WBC: 8.6 10*3/uL (ref 3.8–10.8)

## 2020-06-11 LAB — COMPREHENSIVE METABOLIC PANEL
AG Ratio: 1.3 (calc) (ref 1.0–2.5)
ALT: 19 U/L (ref 6–29)
AST: 17 U/L (ref 10–30)
Albumin: 4.7 g/dL (ref 3.6–5.1)
Alkaline phosphatase (APISO): 73 U/L (ref 31–125)
BUN: 9 mg/dL (ref 7–25)
CO2: 26 mmol/L (ref 20–32)
Calcium: 9.4 mg/dL (ref 8.6–10.2)
Chloride: 103 mmol/L (ref 98–110)
Creat: 0.8 mg/dL (ref 0.50–1.10)
Globulin: 3.5 g/dL (calc) (ref 1.9–3.7)
Glucose, Bld: 75 mg/dL (ref 65–99)
Potassium: 4.2 mmol/L (ref 3.5–5.3)
Sodium: 136 mmol/L (ref 135–146)
Total Bilirubin: 0.4 mg/dL (ref 0.2–1.2)
Total Protein: 8.2 g/dL — ABNORMAL HIGH (ref 6.1–8.1)

## 2020-06-11 LAB — LIPASE: Lipase: 36 U/L (ref 7–60)

## 2020-06-11 LAB — LITHIUM LEVEL: Lithium Lvl: 0.5 mmol/L — ABNORMAL LOW (ref 0.6–1.2)

## 2020-06-16 ENCOUNTER — Other Ambulatory Visit: Payer: Self-pay | Admitting: Physician Assistant

## 2020-06-16 MED ORDER — PREDNISONE 20 MG PO TABS: 40.0000 mg | ORAL_TABLET | Freq: Every day | ORAL | 0 refills | Status: DC

## 2020-06-21 ENCOUNTER — Encounter: Payer: Self-pay | Admitting: Physician Assistant

## 2020-06-21 ENCOUNTER — Other Ambulatory Visit: Payer: Self-pay

## 2020-06-21 ENCOUNTER — Ambulatory Visit (INDEPENDENT_AMBULATORY_CARE_PROVIDER_SITE_OTHER): Payer: Medicare Other | Admitting: Physician Assistant

## 2020-06-21 ENCOUNTER — Other Ambulatory Visit: Payer: Self-pay | Admitting: Physician Assistant

## 2020-06-21 VITALS — BP 144/100 | HR 92 | Ht 66.0 in | Wt 183.0 lb

## 2020-06-21 DIAGNOSIS — R071 Chest pain on breathing: Secondary | ICD-10-CM

## 2020-06-21 DIAGNOSIS — R079 Chest pain, unspecified: Secondary | ICD-10-CM | POA: Diagnosis not present

## 2020-06-21 LAB — COMPREHENSIVE METABOLIC PANEL
AG Ratio: 1.4 (calc) (ref 1.0–2.5)
ALT: 32 U/L — ABNORMAL HIGH (ref 6–29)
AST: 21 U/L (ref 10–30)
Albumin: 4.4 g/dL (ref 3.6–5.1)
Alkaline phosphatase (APISO): 64 U/L (ref 31–125)
BUN: 11 mg/dL (ref 7–25)
CO2: 27 mmol/L (ref 20–32)
Calcium: 9.8 mg/dL (ref 8.6–10.2)
Chloride: 104 mmol/L (ref 98–110)
Creat: 0.98 mg/dL (ref 0.50–1.10)
Globulin: 3.2 g/dL (calc) (ref 1.9–3.7)
Glucose, Bld: 90 mg/dL (ref 65–99)
Potassium: 4.3 mmol/L (ref 3.5–5.3)
Sodium: 137 mmol/L (ref 135–146)
Total Bilirubin: 0.4 mg/dL (ref 0.2–1.2)
Total Protein: 7.6 g/dL (ref 6.1–8.1)

## 2020-06-21 LAB — CBC WITH DIFFERENTIAL/PLATELET
Absolute Monocytes: 654 cells/uL (ref 200–950)
Basophils Absolute: 52 cells/uL (ref 0–200)
Basophils Relative: 0.6 %
Eosinophils Absolute: 112 cells/uL (ref 15–500)
Eosinophils Relative: 1.3 %
HCT: 38.3 % (ref 35.0–45.0)
Hemoglobin: 12.9 g/dL (ref 11.7–15.5)
Lymphs Abs: 2391 cells/uL (ref 850–3900)
MCH: 29.7 pg (ref 27.0–33.0)
MCHC: 33.7 g/dL (ref 32.0–36.0)
MCV: 88 fL (ref 80.0–100.0)
MPV: 12.5 fL (ref 7.5–12.5)
Monocytes Relative: 7.6 %
Neutro Abs: 5392 cells/uL (ref 1500–7800)
Neutrophils Relative %: 62.7 %
Platelets: 280 10*3/uL (ref 140–400)
RBC: 4.35 10*6/uL (ref 3.80–5.10)
RDW: 13.1 % (ref 11.0–15.0)
Total Lymphocyte: 27.8 %
WBC: 8.6 10*3/uL (ref 3.8–10.8)

## 2020-06-21 LAB — POCT URINE PREGNANCY: Preg Test, Ur: NEGATIVE

## 2020-06-21 LAB — D-DIMER, QUANTITATIVE: D-Dimer, Quant: 0.24 mcg/mL FEU (ref <0.50)

## 2020-06-21 NOTE — Patient Instructions (Signed)
It was great to see you!  An order for an xray has been put in for you. To get your xray, you can walk in at the [facility] location without a scheduled appointment.  The address is [address]. It is across the street from [facility]. X-ray is located in the basement.  Hours of operation are M-F 8:30am to 5:00pm. Please note that they are closed for lunch between 12:30 and 1:00pm.    Take care,  Cerita Rabelo PA-C  

## 2020-06-21 NOTE — Progress Notes (Signed)
Kathy Zimmerman is a 29 y.o. female here for a follow up of a new problem.  I acted as a Education administrator for [employer], PA-C [employer], LPN   History of Present Illness:   Chief Complaint  Patient presents with   Chest Pain    HPI   Chest pain Pt c/o chest pain with inspiration and heart racing with activity x 1 week.  She was recently treated with Azithromycin for LLL PNA -- this was found when we were doing a CT scan of her abdomen for epigastric pain. She was also given prednisone that did not help her symptoms. She is having bilateral chest tightness with deep inspiration. Mother with hx of multiple PE's. Patient reports that she has had negative work-up for blood clots in the past. When she goes up the stairs her HR is getting in the 160s, and when walking, in the 140s.  Denies left sided chest pain, n/v, SOB, dizziness, lightheadedness.  No LMP recorded. Patient has had an injection.   Past Medical History:  Diagnosis Date   Anxiety    Asthma    Depression    Headache    IBS (irritable bowel syndrome)    Panic attack    Preeclampsia      Social History   Tobacco Use   Smoking status: Never Smoker   Smokeless tobacco: Never Used  Vaping Use   Vaping Use: Never used  Substance Use Topics   Alcohol use: No   Drug use: No    Past Surgical History:  Procedure Laterality Date   CESAREAN SECTION N/A 04/10/2015   Procedure: CESAREAN SECTION;  Surgeon: Crawford Givens, MD;  Location: [facility];  Service: Obstetrics;  Laterality: N/A;   EYE SURGERY     laser   SCLERAL BUCKLE  08/09/2011   Procedure: SCLERAL BUCKLE;  Surgeon: Clent Demark Rankin;  Location: [facility];  Service: Ophthalmology;  Laterality: Left;  with cryo   TENDON REPAIR     R middle finger    Family History  Problem Relation Age of Onset   Depression Father    Depression Sister    Mental illness Sister    Depression Maternal Grandmother    Mental illness Maternal Grandmother     Thyroid disease Maternal Grandmother    Heart disease Maternal Grandfather    Hypertension Maternal Grandfather    Hyperlipidemia Maternal Grandfather    CAD Maternal Grandfather    Thyroid disease Other    CAD Other    Cancer Neg Hx     Allergies  Allergen Reactions   Cat Hair Extract Anaphylaxis   Shrimp [Shellfish Allergy] Anaphylaxis and Swelling   Sulfasalazine Rash   Sulfa Antibiotics Rash    Current Medications:   Current Outpatient Medications:    atenolol (TENORMIN) 25 MG tablet, Take 0.5 tablets (12.5 mg total) by mouth 2 (two) times daily., Disp: 90 tablet, Rfl: 2   busPIRone (BUSPAR) 10 MG tablet, Take 10 mg by mouth in the morning and at bedtime., Disp: , Rfl:    EPINEPHrine (EPIPEN 2-PAK) 0.3 mg/0.3 mL IJ SOAJ injection, Inject 0.3 mLs (0.3 mg total) into the muscle once as needed (for severe allergic reaction). CAll 911 immediately if you have to use this medicine, Disp: 1 Device, Rfl: 2   gabapentin (NEURONTIN) 600 MG tablet, Take 1,800 mg by mouth daily., Disp: , Rfl:    Liraglutide -Weight Management (SAXENDA) 18 MG/3ML SOPN, Inject 1.2 mg into the skin daily., Disp: , Rfl:  lithium 600 MG capsule, Take 600 mg by mouth daily., Disp: , Rfl:    medroxyPROGESTERone (DEPO-PROVERA) 150 MG/ML injection, Inject 1 mL (150 mg total) into the muscle every 3 (three) months., Disp: 1 mL, Rfl: 3   megestrol (MEGACE) 40 MG tablet, TAKE 3 TABS X 5 DAYS, 2 TABS X 5 DAYS, 1 TAB DAILY TO CONTROL VAG BLEEDING, STOP WHEN BLEEDING STOPS, Disp: 45 tablet, Rfl: 0   ondansetron (ZOFRAN) 4 MG tablet, Take 1 tablet (4 mg total) by mouth every 8 (eight) hours as needed for nausea or vomiting., Disp: 20 tablet, Rfl: 0   predniSONE (DELTASONE) 20 MG tablet, Take 2 tablets (40 mg total) by mouth daily., Disp: 10 tablet, Rfl: 0   PROAIR HFA 108 (90 Base) MCG/ACT inhaler, Inhale 2 puffs into the lungs every 4 (four) hours as needed for wheezing. , Disp: , Rfl: 3    venlafaxine XR (EFFEXOR-XR) 150 MG 24 hr capsule, Take 150 mg by mouth daily., Disp: , Rfl:    venlafaxine XR (EFFEXOR-XR) 37.5 MG 24 hr capsule, Take 37.5 mg by mouth daily., Disp: , Rfl:    Review of Systems:   ROS Negative unless otherwise specified per HPI.  Vitals:   Vitals:   06/21/20 1407  BP: (!) 144/100  Pulse: 92  SpO2: 99%  Weight: 183 lb (83 kg)  Height: 5\' 6"  (1.676 m)     Body mass index is 29.54 kg/m.  Physical Exam:   Physical Exam Vitals and nursing note reviewed.  Constitutional:      General: She is not in acute distress.    Appearance: She is well-developed. She is not ill-appearing or toxic-appearing.  Cardiovascular:     Rate and Rhythm: Normal rate and regular rhythm.     Pulses: Normal pulses.     Heart sounds: Normal heart sounds, S1 normal and S2 normal.     Comments: No LE edema Pulmonary:     Effort: Pulmonary effort is normal.     Breath sounds: Normal breath sounds.  Skin:    General: Skin is warm and dry.  Neurological:     Mental Status: She is alert.     GCS: GCS eye subscore is 4. GCS verbal subscore is 5. GCS motor subscore is 6.  Psychiatric:        Speech: Speech normal.        Behavior: Behavior normal. Behavior is cooperative.      Assessment and Plan:   Kathy Zimmerman was seen today for chest pain.  Diagnoses and all orders for this visit:  Chest pain, unspecified type -     EKG 12-Lead -     Comprehensive metabolic panel -     CBC with Differential/Platelet -     POCT urine pregnancy -     D-Dimer, Quantitative   She is in NAD. EKG tracing is personally reviewed.  EKG notes NSR.  No acute changes.  Urine pregnancy test is negative. Stat D-Dimer is negative. Possible costocohrondritis, however recommend CXR for further evaluation of symptoms. Also obtain CBC and CMP. Worsening symptoms discussed/advised. Further intervention based on results.  CMA or LPN served as scribe during this visit. History, Physical, and  Plan performed by medical provider. The above documentation has been reviewed and is accurate and complete.  [name], PA-C

## 2020-06-22 ENCOUNTER — Ambulatory Visit (INDEPENDENT_AMBULATORY_CARE_PROVIDER_SITE_OTHER): Payer: Medicare Other

## 2020-06-22 ENCOUNTER — Ambulatory Visit (INDEPENDENT_AMBULATORY_CARE_PROVIDER_SITE_OTHER)
Admission: RE | Admit: 2020-06-22 | Discharge: 2020-06-22 | Disposition: A | Discharge status: Home / Self Care | Payer: Medicare Other | Source: Ambulatory Visit | Attending: Physician Assistant | Admitting: Physician Assistant

## 2020-06-22 ENCOUNTER — Ambulatory Visit (HOSPITAL_BASED_OUTPATIENT_CLINIC_OR_DEPARTMENT_OTHER)
Admission: RE | Admit: 2020-06-22 | Discharge: 2020-06-22 | Disposition: A | Discharge status: Home / Self Care | Payer: PRIVATE HEALTH INSURANCE | Source: Ambulatory Visit | Attending: Physician Assistant | Admitting: Physician Assistant

## 2020-06-22 ENCOUNTER — Other Ambulatory Visit: Payer: Self-pay | Admitting: Physician Assistant

## 2020-06-22 DIAGNOSIS — J984 Other disorders of lung: Secondary | ICD-10-CM | POA: Diagnosis not present

## 2020-06-22 DIAGNOSIS — R918 Other nonspecific abnormal finding of lung field: Secondary | ICD-10-CM | POA: Diagnosis not present

## 2020-06-22 DIAGNOSIS — R071 Chest pain on breathing: Secondary | ICD-10-CM

## 2020-06-22 DIAGNOSIS — Z308 Encounter for other contraceptive management: Secondary | ICD-10-CM | POA: Diagnosis not present

## 2020-06-22 MED ORDER — IOHEXOL 300 MG/ML  SOLN
100.0000 mL | Freq: Once | INTRAMUSCULAR | Status: AC | PRN
  Administered 2020-06-22: 100 mL via INTRAVENOUS

## 2020-06-22 MED ORDER — DICLOFENAC SODIUM 75 MG PO TBEC: 75.0000 mg | DELAYED_RELEASE_TABLET | Freq: Two times a day (BID) | ORAL | 0 refills | Status: DC

## 2020-06-22 MED ORDER — AMOXICILLIN-POT CLAVULANATE 875-125 MG PO TABS: 1.0000 | ORAL_TABLET | Freq: Two times a day (BID) | ORAL | 0 refills | Status: DC

## 2020-06-22 MED ORDER — MEDROXYPROGESTERONE ACETATE 150 MG/ML IM SUSP
150.0000 mg | Freq: Once | INTRAMUSCULAR | Status: AC
  Administered 2020-06-22: 150 mg via INTRAMUSCULAR

## 2020-06-22 NOTE — Progress Notes (Signed)
Kathy Zimmerman 29 y.o. female presents to office today for 3 month birth control administration. medroxyPROGESTERone 150 mg/mL left ventrogluteal. Patient tolerated well.

## 2020-06-24 ENCOUNTER — Other Ambulatory Visit: Payer: Self-pay

## 2020-06-24 ENCOUNTER — Encounter: Payer: Self-pay | Admitting: Pulmonary Disease

## 2020-06-24 ENCOUNTER — Ambulatory Visit (INDEPENDENT_AMBULATORY_CARE_PROVIDER_SITE_OTHER): Payer: Medicare Other | Admitting: Pulmonary Disease

## 2020-06-24 VITALS — BP 124/82 | HR 91 | Temp 97.7°F | Ht 66.0 in | Wt 185.4 lb

## 2020-06-24 DIAGNOSIS — J069 Acute upper respiratory infection, unspecified: Secondary | ICD-10-CM | POA: Diagnosis not present

## 2020-06-24 DIAGNOSIS — J4520 Mild intermittent asthma, uncomplicated: Secondary | ICD-10-CM | POA: Diagnosis not present

## 2020-06-24 DIAGNOSIS — J189 Pneumonia, unspecified organism: Secondary | ICD-10-CM

## 2020-06-24 DIAGNOSIS — J302 Other seasonal allergic rhinitis: Secondary | ICD-10-CM

## 2020-06-24 MED ORDER — ARNUITY ELLIPTA 100 MCG/ACT IN AEPB: 1.0000 | INHALATION_SPRAY | Freq: Every day | RESPIRATORY_TRACT | 5 refills | Status: DC

## 2020-06-24 NOTE — Patient Instructions (Addendum)
Thank you for visiting Dr. Valeta Harms at [facility]. Today we recommend the following: Orders Placed This Encounter  Procedures  . CT Chest Wo Contrast  . Immunoglobulins, QN, A/E/G/M  . Pulmonary Function Test   Start new inhaler, inhaled ICS samples today  And new prescriptions Use Albuterol if needed   Return in about 3 months (around 09/24/2020) for with APP or Dr. Valeta Harms.    Please do your part to reduce the spread of COVID-19.

## 2020-06-24 NOTE — Progress Notes (Signed)
Synopsis: Referred in Oct 2021 for pna by [name], PA  Subjective:   PATIENT ID: Kathy Zimmerman GENDER: female DOB: [birth year], MRN: [id number]  Chief Complaint  Patient presents with  . Consult    referred for chest discomfort while breathing,diagnosised with pna    PMH asthma (not on therapy), POTS, pre-eclampsia, had pna after second child, usually has 3-4 times per year, seasonal allergies. Dx with asthma as kid (29 yo), was started on prn albuterol.  Patient here today for evaluation of abnormal CT imaging.  Was diagnosed with pneumonia.  She has had 2 rounds of antibiotics.  Currently completing a course of Augmentin.  Initial CAT scan was done of the abdomen and pelvis for evaluation of abdominal issues.  Showed lower lobe rounded opacity in the left side as well as a more nodular opacity also within the left lower lobe.  She subsequently had a noncontrasted CT of the chest for follow-up approximately 2 weeks later which showed partial resolution of these infiltrates.  She has had pneumonia twice in the past.  She also has recurrent sinus symptoms and seasonal allergies.  She was diagnosed with asthma as a kid rarely uses albuterol not on a maintenance medication.  She does complain of chest tightness on occasion.  Her symptoms also occur more commonly at nighttime.    Past Medical History:  Diagnosis Date  . Anxiety   . Asthma   . Depression   . Headache   . IBS (irritable bowel syndrome)   . Panic attack   . Preeclampsia      Family History  Problem Relation Age of Onset  . Depression Father   . Depression Sister   . Mental illness Sister   . Depression Maternal Grandmother   . Mental illness Maternal Grandmother   . Thyroid disease Maternal Grandmother   . Heart disease Maternal Grandfather   . Hypertension Maternal Grandfather   . Hyperlipidemia Maternal Grandfather   . CAD Maternal Grandfather   . Thyroid disease Other   . CAD Other   . Cancer Neg  Hx      Past Surgical History:  Procedure Laterality Date  . CESAREAN SECTION N/A 04/10/2015   Procedure: CESAREAN SECTION;  Surgeon: Crawford Givens, MD;  Location: [facility];  Service: Obstetrics;  Laterality: N/A;  . EYE SURGERY     laser  . SCLERAL BUCKLE  08/09/2011   Procedure: SCLERAL BUCKLE;  Surgeon: Clent Demark Rankin;  Location: [facility];  Service: Ophthalmology;  Laterality: Left;  with cryo  . TENDON REPAIR     R middle finger    Social History   Socioeconomic History  . Marital status: Divorced    Spouse name: Not on file  . Number of children: 2  . Years of education: Not on file  . Highest education level: Not on file  Occupational History  . Not on file  Tobacco Use  . Smoking status: Never Smoker  . Smokeless tobacco: Never Used  Vaping Use  . Vaping Use: Never used  Substance and Sexual Activity  . Alcohol use: No  . Drug use: No  . Sexual activity: Yes    Birth control/protection: Injection  Other Topics Concern  . Not on file  Social History Narrative   Moved to [place]   32 and 28 year old boys (2021)   7 years married   In a relationship   Social Determinants of Health   Financial Resource Strain:   .  Difficulty of Paying Living Expenses: Not on file  Food Insecurity:   . Worried About Charity fundraiser in the Last Year: Not on file  . Ran Out of Food in the Last Year: Not on file  Transportation Needs:   . Lack of Transportation (Medical): Not on file  . Lack of Transportation (Non-Medical): Not on file  Physical Activity:   . Days of Exercise per Week: Not on file  . Minutes of Exercise per Session: Not on file  Stress:   . Feeling of Stress : Not on file  Social Connections:   . Frequency of Communication with Friends and Family: Not on file  . Frequency of Social Gatherings with Friends and Family: Not on file  . Attends Religious Services: Not on file  . Active Member of Clubs or Organizations: Not on file  . Attends Archivist  Meetings: Not on file  . Marital Status: Not on file  Intimate Partner Violence:   . Fear of Current or Ex-Partner: Not on file  . Emotionally Abused: Not on file  . Physically Abused: Not on file  . Sexually Abused: Not on file     Allergies  Allergen Reactions  . Cat Hair Extract Anaphylaxis  . Shrimp [Shellfish Allergy] Anaphylaxis and Swelling  . Sulfasalazine Rash  . Sulfa Antibiotics Rash     Outpatient Medications Prior to Visit  Medication Sig Dispense Refill  . amoxicillin-clavulanate (AUGMENTIN) 875-125 MG tablet Take 1 tablet by mouth 2 (two) times daily. 20 tablet 0  . atenolol (TENORMIN) 25 MG tablet Take 0.5 tablets (12.5 mg total) by mouth 2 (two) times daily. 90 tablet 2  . busPIRone (BUSPAR) 10 MG tablet Take 10 mg by mouth in the morning and at bedtime.    . diclofenac (VOLTAREN) 75 MG EC tablet Take 1 tablet (75 mg total) by mouth 2 (two) times daily. 30 tablet 0  . EPINEPHrine (EPIPEN 2-PAK) 0.3 mg/0.3 mL IJ SOAJ injection Inject 0.3 mLs (0.3 mg total) into the muscle once as needed (for severe allergic reaction). CAll 911 immediately if you have to use this medicine 1 Device 2  . gabapentin (NEURONTIN) 600 MG tablet Take 1,800 mg by mouth daily.    . Liraglutide -Weight Management (SAXENDA) 18 MG/3ML SOPN Inject 1.2 mg into the skin daily.    Marland Kitchen lithium 600 MG capsule Take 600 mg by mouth daily.    . medroxyPROGESTERone (DEPO-PROVERA) 150 MG/ML injection Inject 1 mL (150 mg total) into the muscle every 3 (three) months. 1 mL 3  . megestrol (MEGACE) 40 MG tablet TAKE 3 TABS X 5 DAYS, 2 TABS X 5 DAYS, 1 TAB DAILY TO CONTROL VAG BLEEDING, STOP WHEN BLEEDING STOPS 45 tablet 0  . ondansetron (ZOFRAN) 4 MG tablet Take 1 tablet (4 mg total) by mouth every 8 (eight) hours as needed for nausea or vomiting. 20 tablet 0  . PROAIR HFA 108 (90 Base) MCG/ACT inhaler Inhale 2 puffs into the lungs every 4 (four) hours as needed for wheezing.   3  . venlafaxine XR (EFFEXOR-XR) 150  MG 24 hr capsule Take 150 mg by mouth daily.    Marland Kitchen venlafaxine XR (EFFEXOR-XR) 37.5 MG 24 hr capsule Take 37.5 mg by mouth daily.    . predniSONE (DELTASONE) 20 MG tablet Take 2 tablets (40 mg total) by mouth daily. 10 tablet 0   No facility-administered medications prior to visit.    Review of Systems  Constitutional: Negative for chills,  fever, malaise/fatigue and weight loss.  HENT: Negative for hearing loss, sore throat and tinnitus.   Eyes: Negative for blurred vision and double vision.  Respiratory: Positive for shortness of breath. Negative for cough, hemoptysis, sputum production, wheezing and stridor.   Cardiovascular: Negative for chest pain, palpitations, orthopnea, leg swelling and PND.  Gastrointestinal: Negative for abdominal pain, constipation, diarrhea, heartburn, nausea and vomiting.  Genitourinary: Negative for dysuria, hematuria and urgency.  Musculoskeletal: Negative for joint pain and myalgias.  Skin: Negative for itching and rash.  Neurological: Negative for dizziness, tingling, weakness and headaches.  Endo/Heme/Allergies: Negative for environmental allergies. Does not bruise/bleed easily.  Psychiatric/Behavioral: Negative for depression. The patient is nervous/anxious. The patient does not have insomnia.   All other systems reviewed and are negative.    Objective:  Physical Exam Vitals reviewed.  Constitutional:      General: She is not in acute distress.    Appearance: She is well-developed.  HENT:     Head: Normocephalic and atraumatic.  Eyes:     General: No scleral icterus.    Conjunctiva/sclera: Conjunctivae normal.     Pupils: Pupils are equal, round, and reactive to light.  Neck:     Vascular: No JVD.     Trachea: No tracheal deviation.  Cardiovascular:     Rate and Rhythm: Normal rate and regular rhythm.     Heart sounds: Normal heart sounds. No murmur heard.   Pulmonary:     Effort: Pulmonary effort is normal. No tachypnea, accessory muscle  usage or respiratory distress.     Breath sounds: Normal breath sounds. No stridor. No wheezing, rhonchi or rales.  Abdominal:     General: Bowel sounds are normal. There is no distension.     Palpations: Abdomen is soft.     Tenderness: There is no abdominal tenderness.  Musculoskeletal:        General: No tenderness.     Cervical back: Neck supple.  Lymphadenopathy:     Cervical: No cervical adenopathy.  Skin:    General: Skin is warm and dry.     Capillary Refill: Capillary refill takes less than 2 seconds.     Findings: No rash.  Neurological:     Mental Status: She is alert and oriented to person, place, and time.  Psychiatric:        Behavior: Behavior normal.      Vitals:   06/24/20 1611  BP: 124/82  Pulse: 91  Temp: 97.7 F (36.5 C)  TempSrc: Skin  SpO2: 99%  Weight: 185 lb 6.4 oz (84.1 kg)  Height: 5\' 6"  (1.676 m)   99% on RA BMI Readings from Last 3 Encounters:  06/24/20 29.92 kg/m  06/21/20 29.54 kg/m  06/09/20 29.38 kg/m   Wt Readings from Last 3 Encounters:  06/24/20 185 lb 6.4 oz (84.1 kg)  06/21/20 183 lb (83 kg)  06/09/20 182 lb (82.6 kg)     CBC    Component Value Date/Time   WBC 8.6 06/21/2020 1416   RBC 4.35 06/21/2020 1416   HGB 12.9 06/21/2020 1416   HGB 10.8 (L) 08/23/2016 1503   HCT 38.3 06/21/2020 1416   HCT 32.5 (L) 08/23/2016 1503   PLT 280 06/21/2020 1416   PLT 194 08/23/2016 1503   MCV 88.0 06/21/2020 1416   MCV 85 08/23/2016 1503   MCH 29.7 06/21/2020 1416   MCHC 33.7 06/21/2020 1416   RDW 13.1 06/21/2020 1416   RDW 14.0 08/23/2016 1503   LYMPHSABS 2,391 06/21/2020  1416   MONOABS 0.6 11/28/2018 2217   EOSABS 112 06/21/2020 1416   BASOSABS 52 06/21/2020 1416    Chest Imaging: 06/22/2020: CT Chest -- slowly resolving infilrates within the left lower lobe.  The patient's images have been independently reviewed by me.    Pulmonary Functions Testing Results: No flowsheet data found.  FeNO:   Pathology:    Echocardiogram:   Heart Catheterization:     Assessment & Plan:     ICD-10-CM   1. Pneumonia of left lower lobe due to infectious organism  J18.9 Immunoglobulins, QN, A/E/G/M    CT Chest Wo Contrast    Pulmonary Function Test    Immunoglobulins, QN, A/E/G/M  2. Mild intermittent asthma without complication  K53.97 Immunoglobulins, QN, A/E/G/M    CT Chest Wo Contrast    Pulmonary Function Test    Immunoglobulins, QN, A/E/G/M  3. Seasonal allergies  J30.2 Immunoglobulins, QN, A/E/G/M    Immunoglobulins, QN, A/E/G/M  4. Recurrent upper respiratory infection (URI)  J06.9     Discussion:  This is a 29 year old female with a history of pneumonia x2, recurrent upper respiratory tract infections, sinus issues 3-4 times per year.  No family history of immune deficiencies.  She also has asthma diagnosed as a child.  And seasonal allergies.  As for the patient's CT scan imaging it looks like she may have had a inflammatory infiltrate, likely rounded pneumonia based on imaging characteristics.  The follow-up CT imaging started to show resolution of this in comparison to the previous abdominal CT.  It is reassuring to me that the infiltrates appear to be resolving within a few weeks from the initial imaging.  Plan: We will start her on a maintenance inhaler to see if this helps with her respiratory symptoms and her nocturnal symptoms. She very well may have mild intermittent asthma that is causing this. We will also check her immunoglobulins.  Especially the fact that she is young and has already had pneumonia 2 separate times. And given for Arnuity 100 Full pulmonary function tests prior to next office visit Repeat noncontrasted CT of the chest in 3 months to ensure resolution of lower lobe infiltrates.    Current Outpatient Medications:  .  amoxicillin-clavulanate (AUGMENTIN) 875-125 MG tablet, Take 1 tablet by mouth 2 (two) times daily., Disp: 20 tablet, Rfl: 0 .  atenolol (TENORMIN) 25  MG tablet, Take 0.5 tablets (12.5 mg total) by mouth 2 (two) times daily., Disp: 90 tablet, Rfl: 2 .  busPIRone (BUSPAR) 10 MG tablet, Take 10 mg by mouth in the morning and at bedtime., Disp: , Rfl:  .  diclofenac (VOLTAREN) 75 MG EC tablet, Take 1 tablet (75 mg total) by mouth 2 (two) times daily., Disp: 30 tablet, Rfl: 0 .  EPINEPHrine (EPIPEN 2-PAK) 0.3 mg/0.3 mL IJ SOAJ injection, Inject 0.3 mLs (0.3 mg total) into the muscle once as needed (for severe allergic reaction). CAll 911 immediately if you have to use this medicine, Disp: 1 Device, Rfl: 2 .  gabapentin (NEURONTIN) 600 MG tablet, Take 1,800 mg by mouth daily., Disp: , Rfl:  .  Liraglutide -Weight Management (SAXENDA) 18 MG/3ML SOPN, Inject 1.2 mg into the skin daily., Disp: , Rfl:  .  lithium 600 MG capsule, Take 600 mg by mouth daily., Disp: , Rfl:  .  medroxyPROGESTERone (DEPO-PROVERA) 150 MG/ML injection, Inject 1 mL (150 mg total) into the muscle every 3 (three) months., Disp: 1 mL, Rfl: 3 .  megestrol (MEGACE) 40 MG tablet, TAKE  3 TABS X 5 DAYS, 2 TABS X 5 DAYS, 1 TAB DAILY TO CONTROL VAG BLEEDING, STOP WHEN BLEEDING STOPS, Disp: 45 tablet, Rfl: 0 .  ondansetron (ZOFRAN) 4 MG tablet, Take 1 tablet (4 mg total) by mouth every 8 (eight) hours as needed for nausea or vomiting., Disp: 20 tablet, Rfl: 0 .  PROAIR HFA 108 (90 Base) MCG/ACT inhaler, Inhale 2 puffs into the lungs every 4 (four) hours as needed for wheezing. , Disp: , Rfl: 3 .  venlafaxine XR (EFFEXOR-XR) 150 MG 24 hr capsule, Take 150 mg by mouth daily., Disp: , Rfl:  .  venlafaxine XR (EFFEXOR-XR) 37.5 MG 24 hr capsule, Take 37.5 mg by mouth daily., Disp: , Rfl:   I spent 48 minutes dedicated to the care of this patient on the date of this encounter to include pre-visit review of records, face-to-face time with the patient discussing conditions above, post visit ordering of testing, clinical documentation with the electronic health record, making appropriate referrals as  documented, and communicating necessary findings to members of the patients care team.   Garner Nash, DO [facility] 06/24/2020 4:28 PM

## 2020-06-27 LAB — IMMUNOGLOBULINS A/E/G/M, SERUM
IgA/Immunoglobulin A, Serum: 348 mg/dL (ref 87–352)
IgE (Immunoglobulin E), Serum: 50 IU/mL (ref 6–495)
IgG (Immunoglobin G), Serum: 1598 mg/dL (ref 586–1602)
IgM (Immunoglobulin M), Srm: 136 mg/dL (ref 26–217)

## 2020-07-20 ENCOUNTER — Other Ambulatory Visit (HOSPITAL_COMMUNITY)
Admission: RE | Admit: 2020-07-20 | Discharge: 2020-07-20 | Disposition: A | Discharge status: Home / Self Care | Payer: PRIVATE HEALTH INSURANCE | Source: Ambulatory Visit | Attending: Physician Assistant | Admitting: Physician Assistant

## 2020-07-20 ENCOUNTER — Ambulatory Visit (INDEPENDENT_AMBULATORY_CARE_PROVIDER_SITE_OTHER): Payer: Medicare Other | Admitting: Physician Assistant

## 2020-07-20 ENCOUNTER — Encounter: Payer: Self-pay | Admitting: Physician Assistant

## 2020-07-20 VITALS — BP 120/86 | HR 89 | Temp 98.1°F | Ht 66.0 in | Wt 192.2 lb

## 2020-07-20 DIAGNOSIS — B079 Viral wart, unspecified: Secondary | ICD-10-CM

## 2020-07-20 DIAGNOSIS — D229 Melanocytic nevi, unspecified: Secondary | ICD-10-CM

## 2020-07-20 DIAGNOSIS — L918 Other hypertrophic disorders of the skin: Secondary | ICD-10-CM | POA: Diagnosis not present

## 2020-07-20 NOTE — Progress Notes (Signed)
Kathy Zimmerman is a 29 y.o. female here for a new problem.  I acted as a Education administrator for [employer], PA-C Anselmo Pickler, LPN   History of Present Illness:   Chief Complaint  Patient presents with  . Nevus    HPI   Nevus Pt has a nevus on the outside of left eye x 1 week. She would like it removed. She states that the area is cosmetically bothering her. The skin is darkened there. Denies prior history of skin cancer.  Past Medical History:  Diagnosis Date  . Anxiety   . Asthma   . Depression   . Headache   . IBS (irritable bowel syndrome)   . Panic attack   . Preeclampsia      Social History   Tobacco Use  . Smoking status: Never Smoker  . Smokeless tobacco: Never Used  Vaping Use  . Vaping Use: Never used  Substance Use Topics  . Alcohol use: No  . Drug use: No    Past Surgical History:  Procedure Laterality Date  . CESAREAN SECTION N/A 04/10/2015   Procedure: CESAREAN SECTION;  Surgeon: Crawford Givens, MD;  Location: [facility];  Service: Obstetrics;  Laterality: N/A;  . EYE SURGERY     laser  . SCLERAL BUCKLE  08/09/2011   Procedure: SCLERAL BUCKLE;  Surgeon: Clent Demark Rankin;  Location: [facility];  Service: Ophthalmology;  Laterality: Left;  with cryo  . TENDON REPAIR     R middle finger    Family History  Problem Relation Age of Onset  . Depression Father   . Depression Sister   . Mental illness Sister   . Depression Maternal Grandmother   . Mental illness Maternal Grandmother   . Thyroid disease Maternal Grandmother   . Heart disease Maternal Grandfather   . Hypertension Maternal Grandfather   . Hyperlipidemia Maternal Grandfather   . CAD Maternal Grandfather   . Thyroid disease Other   . CAD Other   . Cancer Neg Hx     Allergies  Allergen Reactions  . Cat Hair Extract Anaphylaxis  . Shrimp [Shellfish Allergy] Anaphylaxis and Swelling  . Sulfasalazine Rash  . Sulfa Antibiotics Rash    Current Medications:   Current Outpatient Medications:   .  atenolol (TENORMIN) 25 MG tablet, Take 0.5 tablets (12.5 mg total) by mouth 2 (two) times daily., Disp: 90 tablet, Rfl: 2 .  busPIRone (BUSPAR) 10 MG tablet, Take 10 mg by mouth in the morning and at bedtime., Disp: , Rfl:  .  diclofenac (VOLTAREN) 75 MG EC tablet, Take 1 tablet (75 mg total) by mouth 2 (two) times daily., Disp: 30 tablet, Rfl: 0 .  doxycycline (VIBRA-TABS) 100 MG tablet, Take 100 mg by mouth 2 (two) times daily., Disp: , Rfl:  .  EPINEPHrine (EPIPEN 2-PAK) 0.3 mg/0.3 mL IJ SOAJ injection, Inject 0.3 mLs (0.3 mg total) into the muscle once as needed (for severe allergic reaction). CAll 911 immediately if you have to use this medicine, Disp: 1 Device, Rfl: 2 .  Fluticasone Furoate (ARNUITY ELLIPTA) 100 MCG/ACT AEPB, Inhale 1 puff into the lungs daily., Disp: 30 each, Rfl: 5 .  gabapentin (NEURONTIN) 600 MG tablet, Take 1,800 mg by mouth daily., Disp: , Rfl:  .  Liraglutide -Weight Management (SAXENDA) 18 MG/3ML SOPN, Inject 1.2 mg into the skin daily., Disp: , Rfl:  .  lithium 600 MG capsule, Take 600 mg by mouth daily., Disp: , Rfl:  .  medroxyPROGESTERone (DEPO-PROVERA) 150  MG/ML injection, Inject 1 mL (150 mg total) into the muscle every 3 (three) months., Disp: 1 mL, Rfl: 3 .  megestrol (MEGACE) 40 MG tablet, TAKE 3 TABS X 5 DAYS, 2 TABS X 5 DAYS, 1 TAB DAILY TO CONTROL VAG BLEEDING, STOP WHEN BLEEDING STOPS, Disp: 45 tablet, Rfl: 0 .  ondansetron (ZOFRAN) 4 MG tablet, Take 1 tablet (4 mg total) by mouth every 8 (eight) hours as needed for nausea or vomiting., Disp: 20 tablet, Rfl: 0 .  PROAIR HFA 108 (90 Base) MCG/ACT inhaler, Inhale 2 puffs into the lungs every 4 (four) hours as needed for wheezing. , Disp: , Rfl: 3 .  venlafaxine XR (EFFEXOR-XR) 150 MG 24 hr capsule, Take 150 mg by mouth daily., Disp: , Rfl:  .  venlafaxine XR (EFFEXOR-XR) 37.5 MG 24 hr capsule, Take 37.5 mg by mouth daily., Disp: , Rfl:    Review of Systems:   ROS Negative unless otherwise specified  per HPI.  Vitals:   Vitals:   07/20/20 0735  BP: 120/86  Pulse: 89  Temp: 98.1 F (36.7 C)  TempSrc: Temporal  SpO2: 99%  Weight: 192 lb 4 oz (87.2 kg)  Height: 5\' 6"  (1.676 m)     Body mass index is 31.03 kg/m.  Physical Exam:   Physical Exam Constitutional:      Appearance: She is well-developed.  HENT:     Head: Normocephalic and atraumatic.  Eyes:     Conjunctiva/sclera: Conjunctivae normal.  Pulmonary:     Effort: Pulmonary effort is normal.  Musculoskeletal:        General: Normal range of motion.     Cervical back: Normal range of motion and neck supple.  Skin:    General: Skin is warm and dry.     Comments: 2 mm raised pigmented lesion to L outer eye region  Neurological:     Mental Status: She is alert and oriented to person, place, and time.  Psychiatric:        Behavior: Behavior normal.        Thought Content: Thought content normal.        Judgment: Judgment normal.    Procedure: Shave biopsy Informed consent:  Discussed risks (permanent scarring, infection, pain, bleeding, bruising, redness, and recurrence of the lesion) and benefits of the procedure, as well as the alternatives.  Informed consent was obtained. Anesthesia: cryotherapy  The area was prepared and draped in a standard fashion. Shave biopsy was performed.  The lesion was placed in a collection container to be sent for further analysis. Antibiotic ointment and a sterile dressing were applied.   The patient tolerated procedure well. The patient was instructed on post-op care.   Number of lesions removed:  1    Assessment and Plan:   Kathy Zimmerman was seen today for nevus.  Diagnoses and all orders for this visit:  Nevus -     Surgical pathology( [place]/ [facility])   Tolerated procedure well. Lesion remove and sent for further evaluation. After care instructions discussed.  CMA or LPN served as scribe during this visit. History, Physical, and Plan performed by medical  provider. The above documentation has been reviewed and is accurate and complete.   [name], PA-C

## 2020-07-22 LAB — SURGICAL PATHOLOGY

## 2020-08-06 ENCOUNTER — Other Ambulatory Visit: Payer: Self-pay | Admitting: Cardiovascular Disease

## 2020-08-20 ENCOUNTER — Other Ambulatory Visit: Payer: Self-pay | Admitting: Cardiovascular Disease

## 2020-08-24 ENCOUNTER — Ambulatory Visit (INDEPENDENT_AMBULATORY_CARE_PROVIDER_SITE_OTHER): Payer: Medicare Other

## 2020-08-24 DIAGNOSIS — Z3040 Encounter for surveillance of contraceptives, unspecified: Secondary | ICD-10-CM

## 2020-08-24 MED ORDER — MEDROXYPROGESTERONE ACETATE 150 MG/ML IM SUSP
150.0000 mg | Freq: Once | INTRAMUSCULAR | Status: AC
  Administered 2020-08-24: 150 mg via INTRAMUSCULAR

## 2020-08-24 NOTE — Progress Notes (Signed)
Per orders of [name], [place], injection of Depo Provera given by [name] in left deltoid. Patient tolerated injection well. Patient will make appointment for 3 month. Pt will schedule appointment for 3/1-15th.

## 2020-09-16 ENCOUNTER — Other Ambulatory Visit: Payer: Self-pay | Admitting: Physician Assistant

## 2020-09-16 ENCOUNTER — Encounter: Payer: Self-pay | Admitting: Physician Assistant

## 2020-09-16 MED ORDER — ONDANSETRON HCL 4 MG PO TABS: 4.0000 mg | ORAL_TABLET | Freq: Three times a day (TID) | ORAL | 2 refills | Status: DC | PRN

## 2020-09-21 ENCOUNTER — Other Ambulatory Visit: Payer: Medicare Other

## 2020-09-21 ENCOUNTER — Ambulatory Visit: Payer: Medicare Other

## 2020-09-21 ENCOUNTER — Other Ambulatory Visit: Payer: Self-pay

## 2020-09-21 ENCOUNTER — Other Ambulatory Visit: Payer: Self-pay | Admitting: *Deleted

## 2020-09-21 DIAGNOSIS — R197 Diarrhea, unspecified: Secondary | ICD-10-CM

## 2020-09-22 ENCOUNTER — Other Ambulatory Visit: Payer: PRIVATE HEALTH INSURANCE

## 2020-09-23 ENCOUNTER — Other Ambulatory Visit: Payer: Self-pay | Admitting: Family Medicine

## 2020-09-23 LAB — NOVEL CORONAVIRUS, NAA: SARS-CoV-2, NAA: DETECTED — AB

## 2020-09-23 LAB — SARS-COV-2, NAA 2 DAY TAT

## 2020-09-23 MED ORDER — BUDESONIDE 1 MG/2ML IN SUSP: 1.0000 mg | Freq: Two times a day (BID) | RESPIRATORY_TRACT | 0 refills | Status: DC

## 2020-09-23 NOTE — Progress Notes (Signed)
Patient with covid. Already on nutraceutical bundle. On day 7, seems to be improving. Still has some chest congestion. Oxygen at 97%.   -continue vitamins -already on SNRI -starting pulmicort BID (does not have her ellipta inhaler) -does not need oral steroids at this time, but in time frame for them.  -keep me posted.  Orma Flaming, MD [place]

## 2020-09-25 ENCOUNTER — Other Ambulatory Visit (HOSPITAL_COMMUNITY): Payer: Medicare Other

## 2020-09-29 ENCOUNTER — Ambulatory Visit: Payer: PRIVATE HEALTH INSURANCE | Admitting: Pulmonary Disease

## 2020-10-19 ENCOUNTER — Other Ambulatory Visit: Payer: Self-pay | Admitting: Emergency Medicine

## 2020-10-19 DIAGNOSIS — R635 Abnormal weight gain: Secondary | ICD-10-CM

## 2020-10-19 MED ORDER — SAXENDA 18 MG/3ML ~~LOC~~ SOPN: 1.2000 mg | PEN_INJECTOR | Freq: Every day | SUBCUTANEOUS | 5 refills | Status: DC

## 2020-10-19 NOTE — Telephone Encounter (Signed)
[facility] for refill? LOV: 07/20/20 Nevus

## 2020-10-25 ENCOUNTER — Other Ambulatory Visit: Payer: Self-pay | Admitting: Physician Assistant

## 2020-10-25 ENCOUNTER — Telehealth: Payer: Self-pay | Admitting: *Deleted

## 2020-10-25 MED ORDER — OZEMPIC (0.25 OR 0.5 MG/DOSE) 2 MG/1.5ML ~~LOC~~ SOPN: 0.5000 mg | PEN_INJECTOR | SUBCUTANEOUS | 3 refills | Status: DC

## 2020-10-25 NOTE — Telephone Encounter (Signed)
Received fax from pharmacy need PA for [place]. PA done thru covermymeds on 10/21/2020. Key: [id number] Your prior authorization for Kathy Zimmerman has been approved!  Pt aware, but going to change to a weekly medication due to pt has trouble remembering doing everyday.

## 2020-10-27 ENCOUNTER — Other Ambulatory Visit (HOSPITAL_COMMUNITY)
Admission: RE | Admit: 2020-10-27 | Discharge: 2020-10-27 | Disposition: A | Discharge status: Home / Self Care | Payer: PRIVATE HEALTH INSURANCE | Source: Ambulatory Visit | Attending: Obstetrics and Gynecology | Admitting: Obstetrics and Gynecology

## 2020-10-27 ENCOUNTER — Other Ambulatory Visit: Payer: Self-pay

## 2020-10-27 ENCOUNTER — Encounter: Payer: Self-pay | Admitting: Obstetrics and Gynecology

## 2020-10-27 ENCOUNTER — Ambulatory Visit (INDEPENDENT_AMBULATORY_CARE_PROVIDER_SITE_OTHER): Payer: PRIVATE HEALTH INSURANCE | Admitting: Obstetrics and Gynecology

## 2020-10-27 VITALS — HR 101 | Ht 66.0 in | Wt 197.0 lb

## 2020-10-27 DIAGNOSIS — Z113 Encounter for screening for infections with a predominantly sexual mode of transmission: Secondary | ICD-10-CM | POA: Insufficient documentation

## 2020-10-27 DIAGNOSIS — Z01419 Encounter for gynecological examination (general) (routine) without abnormal findings: Secondary | ICD-10-CM | POA: Diagnosis not present

## 2020-10-27 DIAGNOSIS — Z124 Encounter for screening for malignant neoplasm of cervix: Secondary | ICD-10-CM | POA: Diagnosis not present

## 2020-10-27 DIAGNOSIS — Z3009 Encounter for other general counseling and advice on contraception: Secondary | ICD-10-CM | POA: Diagnosis not present

## 2020-10-27 NOTE — Patient Instructions (Signed)
Surgery to Prevent Pregnancy Sterilization is surgery to prevent pregnancy. Sterilization is permanent. It should only be done if you are sure that you do not want to have children. For females, the fallopian tubes are either blocked or closed off. When the fallopian tubes are closed, the eggs that the ovaries release cannot enter the uterus, sperm cannot reach the eggs, and pregnancy is prevented. For males, the vas deferens is cut and then tied or burned (cauterized). The vas deferens is a tube that carries sperm from the testicles. This procedure prevents pregnancy by blocking sperm from going through the vas deferens and penis during ejaculation. Types of sterilization For females, the surgeries include:  Laparoscopic tubal ligation. In this surgery, the fallopian tubes are tied off, sealed with heat, or blocked with a clip, ring, or clamp. A small portion of each fallopian tube may also be removed. This surgery is done through several small cuts (incisions) with special instruments that are inserted into the abdomen.  Postpartum tubal ligation. This is also called a mini-laparotomy. This surgery is done right after childbirth or 1 or 2 days after childbirth. In this surgery, the fallopian tubes are tied off, sealed with heat, or blocked with a clip, ring, or clamp. A small portion of each fallopian tube may also be removed. The surgery is done through a single incision in the abdomen.  Tubal ligation during a C-section. In this surgery, the fallopian tubes are tied off, sealed with heat, or blocked with a clip, ring, or clamp. A small portion of each fallopian tube may also be removed. The surgery is done at the same time as a C-section delivery. For males, the surgeries include:  Incision vasectomy. In this surgery, one or two small incisions are made in the scrotum. The vas deferens will be pulled out of the scrotum and cut. The vas deferens will be tied off or sealed with heat and placed back  into your scrotum. The incision will be closed with absorbable stitches (sutures).  No scalpel vasectomy. In this surgery, a punctured opening is made in the scrotum. The vas deferens will be pulled out of the scrotum and cut. The vas deferens will be tied off or sealed with heat and placed back into your scrotum. The opening is small and will not require sutures.      What are the benefits of sterilization?  It is usually effective for a lifetime.  The procedures are generally safe.  For females, sterilization does not affect the hormones, like other types of birth control. Because of this, menstrual periods will not be affected.  For both males and females, sexual desire and sexual performance will not be affected. What are the disadvantages of sterilization? Risks from the surgery Generally, sterilization is safe. Complications are rare. However, there are some risks. They include:  Bleeding.  Infection.  Reaction to medicine used during the procedure.  Injury to surrounding organs. Risks after sterilization After a successful surgery, you may have other problems. Female sterilization risks may include:  Failure of the procedure. Sterilization is nearly 100% effective, but it can fail. In rare cases, the fallopian tubes can grow back together over time. If this happens, a female will be able to get pregnant again.  A higher risk of having an ectopic pregnancy. An ectopic pregnancy is a pregnancy that grows outside of the uterus. This kind of pregnancy can lead to serious bleeding if it is not treated. Female sterilization risks may include:  Bleeding and  swelling of the scrotum.  Failure of the procedure. There is a very small chance that the tied or cauterized ends of the vas deferens may reconnect (recanalization). If this happens, a female could still make a female pregnant. Other risks may include:  A risk that you may change your mind and decide you want have children.  Sterilization may be reversed, but a reversal is not always successful.  Lack of protection against sexually transmitted infections (STIs). What happens during the procedure? The steps of the procedure depend on the type of sterilization you are having. The procedure may vary among health care providers and hospitals. Questions to ask your health care provider  How effective are sterilization procedures?  What type of procedure is right for me?  Is it possible to reverse the procedure if I change my mind?  What can I expect after the procedure? Where to find more information [employer]: JewelryExec.com.pt U.S. Department of Health and Human Services: DustingSprays.pl [facility]: www.urologyhealth.org Summary  Sterilization is surgery to prevent pregnancy.  There are different types of sterilization surgeries.  Sterilization may be reversed, but a reversal is not always successful.  Sterilization does not protect against STIs. This information is not intended to replace advice given to you by your health care provider. Make sure you discuss any questions you have with your health care provider. Document Revised: 05/29/2020 Document Reviewed: 05/29/2020 Elsevier Patient Education  2021 [employer].

## 2020-10-27 NOTE — Progress Notes (Signed)
30 y.o. [id number] Legally Separated White or Caucasian Not Hispanic or Latino female here to discuss sterilization, desires sterilization.  She is currently on depo-provera. Sexually active, same partner x 2.5 years. Her children were born at 67 and 16 weeks for severe preeclampsia via C/S. Had severe postpartum depression and anxiety. A year ago she finished 3 months of inpatient therapy from PTSD from her postpartum depression. Doesn't want to risk that again.     No LMP recorded. Patient has had an injection.          Sexually active: Yes.    The current method of family planning is Depo-Provera injections.    Exercising: No.  The patient does not participate in regular exercise at present. Smoker:  no  Health Maintenance: Pap:  2016 WNL  History of abnormal Pap:  no MMG:  None  BMD:   None  Colonoscopy: none  TDaP:  09/07/18  Gardasil: no    reports that she has never smoked. She has never used smokeless tobacco. She reports that she does not drink alcohol and does not use drugs. Sons are 4 and 5, shares custody 50/50. 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   Past Medical History:  Diagnosis Date  . Anxiety   . Asthma   . Depression   . Headache   . IBS (irritable bowel syndrome)   . Panic attack   . Preeclampsia     Past Surgical History:  Procedure Laterality Date  . CESAREAN SECTION N/A 04/10/2015   Procedure: CESAREAN SECTION;  Surgeon: Crawford Givens, MD;  Location: [facility];  Service: Obstetrics;  Laterality: N/A;  . EYE SURGERY     laser  . SCLERAL BUCKLE  08/09/2011   Procedure: SCLERAL BUCKLE;  Surgeon: Clent Demark Rankin;  Location: [facility];  Service: Ophthalmology;  Laterality: Left;  with cryo  . TENDON REPAIR     R middle finger    Current Outpatient Medications  Medication Sig Dispense Refill  . atenolol (TENORMIN) 25 MG tablet TAKE 0.5 TABLETS (12.5 MG TOTAL) BY MOUTH 2 (TWO) TIMES DAILY. 60 tablet 0  . budesonide (PULMICORT) 1 MG/2ML nebulizer solution  Take 2 mLs (1 mg total) by nebulization in the morning and at bedtime. 60 mL 0  . busPIRone (BUSPAR) 10 MG tablet Take 10 mg by mouth in the morning and at bedtime.    Marland Kitchen EPINEPHrine (EPIPEN 2-PAK) 0.3 mg/0.3 mL IJ SOAJ injection Inject 0.3 mLs (0.3 mg total) into the muscle once as needed (for severe allergic reaction). CAll 911 immediately if you have to use this medicine 1 Device 2  . Fluticasone Furoate (ARNUITY ELLIPTA) 100 MCG/ACT AEPB Inhale 1 puff into the lungs daily. 30 each 5  . gabapentin (NEURONTIN) 600 MG tablet Take 1,800 mg by mouth daily.    Marland Kitchen lithium 600 MG capsule Take 600 mg by mouth daily.    . medroxyPROGESTERone (DEPO-PROVERA) 150 MG/ML injection Inject 1 mL (150 mg total) into the muscle every 3 (three) months. 1 mL 3  . Semaglutide,0.25 or 0.5MG /DOS, (OZEMPIC, 0.25 OR 0.5 MG/DOSE,) 2 MG/1.5ML SOPN Inject 0.5 mg into the skin once a week. 1.5 mL 3  . venlafaxine XR (EFFEXOR-XR) 150 MG 24 hr capsule Take 150 mg by mouth daily.    Marland Kitchen venlafaxine XR (EFFEXOR-XR) 37.5 MG 24 hr capsule Take 37.5 mg by mouth daily.     No current facility-administered medications for this visit.    Family History  Problem Relation Age  of Onset  . Depression Father   . Depression Sister   . Mental illness Sister   . Depression Maternal Grandmother   . Mental illness Maternal Grandmother   . Thyroid disease Maternal Grandmother   . Heart disease Maternal Grandfather   . Hypertension Maternal Grandfather   . Hyperlipidemia Maternal Grandfather   . CAD Maternal Grandfather   . Thyroid disease Other   . CAD Other   . Cancer Neg Hx     Review of Systems  Psychiatric/Behavioral: Negative for dysphoric mood. The patient is not nervous/anxious.   All other systems reviewed and are negative.   Exam:   Pulse (!) 101   Ht 5\' 6"  (1.676 m)   Wt 197 lb (89.4 kg)   SpO2 97%   BMI 31.80 kg/m   Weight change: @WEIGHTCHANGE @ Height:   Height: 5\' 6"  (167.6 cm)  Ht Readings from Last 3  Encounters:  10/27/20 5\' 6"  (1.676 m)  07/20/20 5\' 6"  (1.676 m)  06/24/20 5\' 6"  (1.676 m)    General appearance: alert, cooperative and appears stated age Head: Normocephalic, without obvious abnormality, atraumatic Neck: no adenopathy, supple, symmetrical, trachea midline and thyroid normal to inspection and palpation Lungs: clear to auscultation bilaterally Cardiovascular: regular rate and rhythm Breasts: normal appearance, no masses or tenderness Abdomen: soft, non-tender; non distended,  no masses,  no organomegaly Extremities: extremities normal, atraumatic, no cyanosis or edema Skin: Skin color, texture, turgor normal. No rashes or lesions Lymph nodes: Cervical, supraclavicular, and axillary nodes normal. No abnormal inguinal nodes palpated Neurologic: Grossly normal   Pelvic: External genitalia:  no lesions              Urethra:  normal appearing urethra with no masses, tenderness or lesions              Bartholins and Skenes: normal                 Vagina: normal appearing vagina with normal color and discharge, no lesions              Cervix: no lesions               Bimanual Exam:  Uterus:  no masses or tenderness              Adnexa: no mass, fullness, tenderness               Rectovaginal: Confirms               Anus:  normal sphincter tone, no lesions  Gae Dry chaperoned for the exam.  1. Well woman exam Discussed breast self exam Discussed calcium and vit D intake Pap today Labs with primary  2. Screening for cervical cancer - Cytology - PAP with reflex hpv  3. Screening examination for STD (sexually transmitted disease) - Cytology - PAP with GC/CT/Trich Declines blood work   4. General counseling and advice on female contraception Desires sterilization. H/O severe preeclampsia with early deliveries of both of her children. Also had life altering post partum depression.  Sure she doesn't want any more children. Discussed laparoscopic salpingectomies.  Reviewed risks, including but not limited to: bleeding, infection, damage to near by organs (bowel, bladder, blood vessels or ureters). Discussed recovery. Questions answered. She would like to proceed.

## 2020-11-01 LAB — CYTOLOGY - PAP
Chlamydia: NEGATIVE
Comment: NEGATIVE
Comment: NEGATIVE
Comment: NORMAL
Diagnosis: NEGATIVE
Neisseria Gonorrhea: NEGATIVE
Trichomonas: NEGATIVE

## 2020-11-02 ENCOUNTER — Telehealth: Payer: Self-pay

## 2020-11-02 NOTE — Telephone Encounter (Signed)
-----   Message from Salvadore Dom, MD sent at 10/27/2020  3:47 PM EST ----- Please schedule this patient for laparoscopic bilateral salpingectomies for sterilization. Thanks, Sharee Pimple  CCAngie Fava

## 2020-11-02 NOTE — Telephone Encounter (Signed)
Call to patient. Per DPR, OK to leave message on voicemail.   Left voicemail requesting a return call to [facility] to review benefits for recommended surgery with Sumner Boast, MD.

## 2020-11-08 NOTE — Telephone Encounter (Signed)
Call to patient. Per DPR, OK to leave message on voicemail.  Left voicemail requesting a return call to [facility] to review benefits for recommended surgery with Sumner Boast, MD.

## 2020-11-10 ENCOUNTER — Ambulatory Visit: Payer: Medicare Other | Admitting: Physician Assistant

## 2020-11-16 ENCOUNTER — Ambulatory Visit (INDEPENDENT_AMBULATORY_CARE_PROVIDER_SITE_OTHER): Payer: PRIVATE HEALTH INSURANCE

## 2020-11-16 ENCOUNTER — Ambulatory Visit: Payer: PRIVATE HEALTH INSURANCE

## 2020-11-16 DIAGNOSIS — Z3040 Encounter for surveillance of contraceptives, unspecified: Secondary | ICD-10-CM | POA: Diagnosis not present

## 2020-11-16 MED ORDER — MEDROXYPROGESTERONE ACETATE 150 MG/ML IM SUSP
150.0000 mg | Freq: Once | INTRAMUSCULAR | Status: AC
  Administered 2020-11-16: 150 mg via INTRAMUSCULAR

## 2020-11-18 NOTE — Progress Notes (Signed)
After obtaining consent, and per orders of [name] PA  injection of Depo Provera given by Loralyn Freshwater.

## 2020-11-24 ENCOUNTER — Ambulatory Visit: Payer: Medicare Other | Admitting: Physician Assistant

## 2020-11-30 NOTE — Telephone Encounter (Signed)
Left message to call Sharee Pimple, RN at [facility], [phone].  Called to review surgery dates.

## 2020-12-11 ENCOUNTER — Other Ambulatory Visit: Payer: Self-pay | Admitting: Physician Assistant

## 2020-12-11 DIAGNOSIS — Z79899 Other long term (current) drug therapy: Secondary | ICD-10-CM

## 2020-12-11 DIAGNOSIS — F3162 Bipolar disorder, current episode mixed, moderate: Secondary | ICD-10-CM | POA: Insufficient documentation

## 2020-12-11 DIAGNOSIS — F603 Borderline personality disorder: Secondary | ICD-10-CM

## 2020-12-11 DIAGNOSIS — F4312 Post-traumatic stress disorder, chronic: Secondary | ICD-10-CM

## 2020-12-14 NOTE — Telephone Encounter (Signed)
No response from patient, MyChart message sent.  

## 2020-12-20 ENCOUNTER — Other Ambulatory Visit (HOSPITAL_COMMUNITY)
Admission: RE | Admit: 2020-12-20 | Discharge: 2020-12-20 | Disposition: A | Discharge status: Home / Self Care | Payer: Medicare Other | Source: Ambulatory Visit | Attending: Physician Assistant | Admitting: Physician Assistant

## 2020-12-20 ENCOUNTER — Encounter: Payer: Self-pay | Admitting: Physician Assistant

## 2020-12-20 ENCOUNTER — Ambulatory Visit (INDEPENDENT_AMBULATORY_CARE_PROVIDER_SITE_OTHER): Payer: PRIVATE HEALTH INSURANCE | Admitting: Physician Assistant

## 2020-12-20 VITALS — BP 126/84 | HR 80 | Temp 98.2°F | Ht 66.0 in | Wt 193.4 lb

## 2020-12-20 DIAGNOSIS — N898 Other specified noninflammatory disorders of vagina: Secondary | ICD-10-CM | POA: Insufficient documentation

## 2020-12-20 DIAGNOSIS — R300 Dysuria: Secondary | ICD-10-CM

## 2020-12-20 LAB — POCT URINALYSIS DIPSTICK
Bilirubin, UA: NEGATIVE
Blood, UA: NEGATIVE
Glucose, UA: NEGATIVE
Ketones, UA: NEGATIVE
Leukocytes, UA: NEGATIVE
Nitrite, UA: NEGATIVE
Protein, UA: NEGATIVE
Spec Grav, UA: 1.02 (ref 1.010–1.025)
Urobilinogen, UA: 0.2 E.U./dL
pH, UA: 6 (ref 5.0–8.0)

## 2020-12-20 MED ORDER — NITROFURANTOIN MONOHYD MACRO 100 MG PO CAPS: 100.0000 mg | ORAL_CAPSULE | Freq: Two times a day (BID) | ORAL | 0 refills | Status: DC

## 2020-12-20 NOTE — Progress Notes (Signed)
Kathy Zimmerman is a 30 y.o. female here for a new problem.  I acted as a Education administrator for [employer], PA-C Anselmo Pickler, LPN   History of Present Illness:   Chief Complaint  Patient presents with  . Dysuria    HPI  Dysuria and Vaginitis Pt c/o burning with urination and urgency x 3 days. She is having yellow vaginal discharge, without itch or odor. Denies fever, chills, back pain, hx of kidney stones/hematuria.  Denies concerns for pregnancy.   Past Medical History:  Diagnosis Date  . Anxiety   . Asthma   . Depression   . Headache   . IBS (irritable bowel syndrome)   . Panic attack   . Preeclampsia      Social History   Tobacco Use  . Smoking status: Never Smoker  . Smokeless tobacco: Never Used  Vaping Use  . Vaping Use: Never used  Substance Use Topics  . Alcohol use: No  . Drug use: No    Past Surgical History:  Procedure Laterality Date  . CESAREAN SECTION N/A 04/10/2015   Procedure: CESAREAN SECTION;  Surgeon: Crawford Givens, MD;  Location: [facility];  Service: Obstetrics;  Laterality: N/A;  . EYE SURGERY     laser  . SCLERAL BUCKLE  08/09/2011   Procedure: SCLERAL BUCKLE;  Surgeon: Clent Demark Rankin;  Location: [facility];  Service: Ophthalmology;  Laterality: Left;  with cryo  . TENDON REPAIR     R middle finger    Family History  Problem Relation Age of Onset  . Depression Father   . Depression Sister   . Mental illness Sister   . Depression Maternal Grandmother   . Mental illness Maternal Grandmother   . Thyroid disease Maternal Grandmother   . Heart disease Maternal Grandfather   . Hypertension Maternal Grandfather   . Hyperlipidemia Maternal Grandfather   . CAD Maternal Grandfather   . Thyroid disease Other   . CAD Other   . Cancer Neg Hx     Allergies  Allergen Reactions  . Cat Hair Extract Anaphylaxis  . Shrimp [Shellfish Allergy] Anaphylaxis and Swelling  . Sulfasalazine Rash  . Sulfa Antibiotics Rash    Current Medications:    Current Outpatient Medications:  .  atenolol (TENORMIN) 25 MG tablet, TAKE 0.5 TABLETS (12.5 MG TOTAL) BY MOUTH 2 (TWO) TIMES DAILY., Disp: 60 tablet, Rfl: 0 .  budesonide (PULMICORT) 1 MG/2ML nebulizer solution, Take 2 mLs (1 mg total) by nebulization in the morning and at bedtime., Disp: 60 mL, Rfl: 0 .  busPIRone (BUSPAR) 10 MG tablet, Take 10 mg by mouth in the morning and at bedtime., Disp: , Rfl:  .  EPINEPHrine (EPIPEN 2-PAK) 0.3 mg/0.3 mL IJ SOAJ injection, Inject 0.3 mLs (0.3 mg total) into the muscle once as needed (for severe allergic reaction). CAll 911 immediately if you have to use this medicine, Disp: 1 Device, Rfl: 2 .  gabapentin (NEURONTIN) 600 MG tablet, Take 1,800 mg by mouth daily., Disp: , Rfl:  .  lithium 600 MG capsule, Take 600 mg by mouth daily., Disp: , Rfl:  .  medroxyPROGESTERone (DEPO-PROVERA) 150 MG/ML injection, Inject 1 mL (150 mg total) into the muscle every 3 (three) months., Disp: 1 mL, Rfl: 3 .  minocycline (MINOCIN) 100 MG capsule, Take 100 mg by mouth 2 (two) times daily., Disp: , Rfl:  .  nitrofurantoin, macrocrystal-monohydrate, (MACROBID) 100 MG capsule, Take 1 capsule (100 mg total) by mouth 2 (two) times daily., Disp:  10 capsule, Rfl: 0 .  Semaglutide,0.25 or 0.5MG /DOS, (OZEMPIC, 0.25 OR 0.5 MG/DOSE,) 2 MG/1.5ML SOPN, Inject 0.5 mg into the skin once a week., Disp: 1.5 mL, Rfl: 3 .  venlafaxine XR (EFFEXOR-XR) 150 MG 24 hr capsule, Take 150 mg by mouth daily., Disp: , Rfl:  .  venlafaxine XR (EFFEXOR-XR) 37.5 MG 24 hr capsule, Take 37.5 mg by mouth daily., Disp: , Rfl:    Review of Systems:   ROS Negative unless otherwise specified per HPI.  Vitals:   Vitals:   12/20/20 0740  BP: 126/84  Pulse: 80  Temp: 98.2 F (36.8 C)  TempSrc: Temporal  SpO2: 98%  Weight: 193 lb 6.1 oz (87.7 kg)  Height: 5\' 6"  (1.676 m)     Body mass index is 31.21 kg/m.  Physical Exam:   Physical Exam Vitals and nursing note reviewed.  Constitutional:       General: She is not in acute distress.    Appearance: She is well-developed. She is not ill-appearing or toxic-appearing.  Cardiovascular:     Rate and Rhythm: Normal rate and regular rhythm.     Pulses: Normal pulses.     Heart sounds: Normal heart sounds, S1 normal and S2 normal.     Comments: No LE edema Pulmonary:     Effort: Pulmonary effort is normal.     Breath sounds: Normal breath sounds.  Skin:    General: Skin is warm and dry.  Neurological:     Mental Status: She is alert.     GCS: GCS eye subscore is 4. GCS verbal subscore is 5. GCS motor subscore is 6.  Psychiatric:        Speech: Speech normal.        Behavior: Behavior normal. Behavior is cooperative.    Results for orders placed or performed in visit on 12/20/20  POCT urinalysis dipstick  Result Value Ref Range   Color, UA yellow    Clarity, UA clear    Glucose, UA Negative Negative   Bilirubin, UA negative    Ketones, UA negative    Spec Grav, UA 1.020 1.010 - 1.025   Blood, UA negative    pH, UA 6.0 5.0 - 8.0   Protein, UA Negative Negative   Urobilinogen, UA 0.2 0.2 or 1.0 E.U./dL   Nitrite, UA negative    Leukocytes, UA Negative Negative   Appearance     Odor      Assessment and Plan:   Kathy Zimmerman was seen today for dysuria.  Diagnoses and all orders for this visit:  Dysuria No red flags on discussion/exam. Urine normal however, she is traveling soon so she would like pocket prescription, I think that this is reasonable -- macrobid sent. Follow-up with culture results as indicated. -     POCT urinalysis dipstick -     Urine Culture  Vaginal discharge Will await swab to treat. -     Cervicovaginal ancillary only  Other orders -     nitrofurantoin, macrocrystal-monohydrate, (MACROBID) 100 MG capsule; Take 1 capsule (100 mg total) by mouth 2 (two) times daily.    CMA or LPN served as scribe during this visit. History, Physical, and Plan performed by medical provider. The above documentation  has been reviewed and is accurate and complete.  [name], PA-C

## 2020-12-21 LAB — CERVICOVAGINAL ANCILLARY ONLY
Bacterial Vaginitis (gardnerella): NEGATIVE
Candida Glabrata: NEGATIVE
Candida Vaginitis: NEGATIVE
Chlamydia: NEGATIVE
Comment: NEGATIVE
Comment: NEGATIVE
Comment: NEGATIVE
Comment: NEGATIVE
Comment: NEGATIVE
Comment: NORMAL
Neisseria Gonorrhea: NEGATIVE
Trichomonas: NEGATIVE

## 2020-12-22 LAB — URINE CULTURE
MICRO NUMBER:: 11754159
SPECIMEN QUALITY:: ADEQUATE

## 2020-12-29 ENCOUNTER — Other Ambulatory Visit: Payer: Self-pay | Admitting: Physician Assistant

## 2020-12-29 ENCOUNTER — Other Ambulatory Visit (INDEPENDENT_AMBULATORY_CARE_PROVIDER_SITE_OTHER): Payer: Medicare Other

## 2020-12-29 ENCOUNTER — Other Ambulatory Visit: Payer: Self-pay

## 2020-12-29 DIAGNOSIS — F4312 Post-traumatic stress disorder, chronic: Secondary | ICD-10-CM

## 2020-12-29 DIAGNOSIS — Z79899 Other long term (current) drug therapy: Secondary | ICD-10-CM

## 2020-12-29 DIAGNOSIS — F3162 Bipolar disorder, current episode mixed, moderate: Secondary | ICD-10-CM | POA: Diagnosis not present

## 2020-12-29 DIAGNOSIS — F603 Borderline personality disorder: Secondary | ICD-10-CM

## 2020-12-29 LAB — LIPID PANEL
Cholesterol: 193 mg/dL (ref 0–200)
HDL: 33.5 mg/dL — ABNORMAL LOW (ref >39.00)
NonHDL: 159.16
Total CHOL/HDL Ratio: 6
Triglycerides: 384 mg/dL — ABNORMAL HIGH (ref 0.0–149.0)
VLDL: 76.8 mg/dL — ABNORMAL HIGH (ref 0.0–40.0)

## 2020-12-29 LAB — COMPREHENSIVE METABOLIC PANEL
ALT: 32 U/L (ref 0–35)
AST: 20 U/L (ref 0–37)
Albumin: 4.1 g/dL (ref 3.5–5.2)
Alkaline Phosphatase: 70 U/L (ref 39–117)
BUN: 9 mg/dL (ref 6–23)
CO2: 25 mEq/L (ref 19–32)
Calcium: 9.4 mg/dL (ref 8.4–10.5)
Chloride: 104 mEq/L (ref 96–112)
Creatinine, Ser: 0.79 mg/dL (ref 0.40–1.20)
GFR: 101.46 mL/min (ref >60.00)
Glucose, Bld: 96 mg/dL (ref 70–99)
Potassium: 4.4 mEq/L (ref 3.5–5.1)
Sodium: 138 mEq/L (ref 135–145)
Total Bilirubin: 0.4 mg/dL (ref 0.2–1.2)
Total Protein: 7.5 g/dL (ref 6.0–8.3)

## 2020-12-29 LAB — CBC WITH DIFFERENTIAL/PLATELET
Basophils Absolute: 0.1 10*3/uL (ref 0.0–0.1)
Basophils Relative: 0.9 % (ref 0.0–3.0)
Eosinophils Absolute: 0.1 10*3/uL (ref 0.0–0.7)
Eosinophils Relative: 1.8 % (ref 0.0–5.0)
HCT: 41.3 % (ref 36.0–46.0)
Hemoglobin: 14 g/dL (ref 12.0–15.0)
Lymphocytes Relative: 28.7 % (ref 12.0–46.0)
Lymphs Abs: 2 10*3/uL (ref 0.7–4.0)
MCHC: 33.9 g/dL (ref 30.0–36.0)
MCV: 87 fl (ref 78.0–100.0)
Monocytes Absolute: 0.5 10*3/uL (ref 0.1–1.0)
Monocytes Relative: 7.2 % (ref 3.0–12.0)
Neutro Abs: 4.2 10*3/uL (ref 1.4–7.7)
Neutrophils Relative %: 61.4 % (ref 43.0–77.0)
Platelets: 226 10*3/uL (ref 150.0–400.0)
RBC: 4.75 Mil/uL (ref 3.87–5.11)
RDW: 13.8 % (ref 11.5–15.5)
WBC: 6.9 10*3/uL (ref 4.0–10.5)

## 2020-12-29 LAB — TSH: TSH: 1.56 u[IU]/mL (ref 0.35–4.50)

## 2020-12-29 LAB — LDL CHOLESTEROL, DIRECT: Direct LDL: 131 mg/dL

## 2020-12-29 NOTE — Addendum Note (Signed)
Addended by: Brandy Hale on: 12/29/2020 08:18 AM   Modules accepted: Orders

## 2020-12-30 ENCOUNTER — Encounter: Payer: Self-pay | Admitting: Family

## 2020-12-30 ENCOUNTER — Ambulatory Visit (INDEPENDENT_AMBULATORY_CARE_PROVIDER_SITE_OTHER): Payer: PRIVATE HEALTH INSURANCE | Admitting: Family

## 2020-12-30 VITALS — BP 122/80 | HR 92 | Temp 98.5°F | Ht 66.0 in | Wt 195.4 lb

## 2020-12-30 DIAGNOSIS — H9202 Otalgia, left ear: Secondary | ICD-10-CM | POA: Diagnosis not present

## 2020-12-30 DIAGNOSIS — J029 Acute pharyngitis, unspecified: Secondary | ICD-10-CM | POA: Diagnosis not present

## 2020-12-30 DIAGNOSIS — R0982 Postnasal drip: Secondary | ICD-10-CM | POA: Diagnosis not present

## 2020-12-30 LAB — POCT RAPID STREP A (OFFICE): Rapid Strep A Screen: NEGATIVE

## 2020-12-30 LAB — LITHIUM LEVEL: Lithium Lvl: 0.4 mmol/L — ABNORMAL LOW (ref 0.6–1.2)

## 2020-12-30 NOTE — Patient Instructions (Signed)
Valere Dross &amp; Nadel's Textbook of Respiratory Medicine (7th ed., pp. [phone]). Elsevier.">  Postnasal Drip Postnasal drip is the feeling of mucus going down the back of your throat. Mucus is a slimy substance that moistens and cleans your nose and throat, as well as the air pockets in face bones near your forehead and cheeks (sinuses). Small amounts of mucus pass from your nose and sinuses down the back of your throat all the time. This is normal. When you produce too much mucus or the mucus gets too thick, you can feel it. Some common causes of postnasal drip include:  Having more mucus because of: ? A cold or the flu. ? Allergies. ? Cold air. ? Certain medicines.  Having more mucus that is thicker because of: ? A sinus or nasal infection. ? Dry air. ? A food allergy. Follow these instructions at home: Relieving discomfort  Gargle with a salt-water mixture 3-4 times a day or as needed. To make a salt-water mixture, completely dissolve -1 tsp of salt in 1 cup of warm water.  If the air in your home is dry, use a humidifier to add moisture to the air.  Use a saline spray or container (neti pot) to flush out the nose (nasal irrigation). These methods can help clear away mucus and keep the nasal passages moist.   General instructions  Take over-the-counter and prescription medicines only as told by your health care provider.  Follow instructions from your health care provider about eating or drinking restrictions. You may need to avoid caffeine.  Avoid things that you know you are allergic to (allergens), like dust, mold, pollen, pets, or certain foods.  Drink enough fluid to keep your urine pale yellow.  Keep all follow-up visits as told by your health care provider. This is important. Contact a health care provider if:  You have a fever.  You have a sore throat.  You have difficulty swallowing.  You have headache.  You have sinus pain.  You have a cough that does not go  away.  The mucus from your nose becomes thick and is green or yellow in color.  You have cold or flu symptoms that last more than 10 days. Summary  Postnasal drip is the feeling of mucus going down the back of your throat.  If your health care provider approves, use nasal irrigation or a nasal spray 2?4 times a day.  Avoid things that you know you are allergic to (allergens), like dust, mold, pollen, pets, or certain foods. This information is not intended to replace advice given to you by your health care provider. Make sure you discuss any questions you have with your health care provider. Document Revised: 06/08/2020 Document Reviewed: 06/08/2020 Elsevier Patient Education  2021 [employer].

## 2021-01-02 NOTE — Progress Notes (Signed)
Acute Office Visit  Subjective:    Patient ID: Kathy Zimmerman, female    DOB: 09-May-1991, 30 y.o.   MRN: [id number]  Chief Complaint  Patient presents with  . Sore Throat    Pt c/o sore throat started today and sore on tongue     HPI Patient is in today with c/o sore throat and sore area on her tongue x 1 day. Has a history of allergic rhinitis. Not taking any medication.  No known sick exposure.   Past Medical History:  Diagnosis Date  . Anxiety   . Asthma   . Depression   . Headache   . IBS (irritable bowel syndrome)   . Panic attack   . Preeclampsia     Past Surgical History:  Procedure Laterality Date  . CESAREAN SECTION N/A 04/10/2015   Procedure: CESAREAN SECTION;  Surgeon: Crawford Givens, MD;  Location: [facility];  Service: Obstetrics;  Laterality: N/A;  . EYE SURGERY     laser  . SCLERAL BUCKLE  08/09/2011   Procedure: SCLERAL BUCKLE;  Surgeon: Clent Demark Rankin;  Location: [facility];  Service: Ophthalmology;  Laterality: Left;  with cryo  . TENDON REPAIR     R middle finger    Family History  Problem Relation Age of Onset  . Depression Father   . Depression Sister   . Mental illness Sister   . Depression Maternal Grandmother   . Mental illness Maternal Grandmother   . Thyroid disease Maternal Grandmother   . Heart disease Maternal Grandfather   . Hypertension Maternal Grandfather   . Hyperlipidemia Maternal Grandfather   . CAD Maternal Grandfather   . Thyroid disease Other   . CAD Other   . Cancer Neg Hx     Social History   Socioeconomic History  . Marital status: Legally Separated    Spouse name: Not on file  . Number of children: 2  . Years of education: Not on file  . Highest education level: Not on file  Occupational History  . Not on file  Tobacco Use  . Smoking status: Never Smoker  . Smokeless tobacco: Never Used  Vaping Use  . Vaping Use: Never used  Substance and Sexual Activity  . Alcohol use: No  . Drug use: No  . Sexual activity:  Yes    Birth control/protection: Injection  Other Topics Concern  . Not on file  Social History Narrative   Moved to [place]   50 and 29 year old boys (2021)   7 years married   In a relationship   Social Determinants of Health   Financial Resource Strain: Not on file  Food Insecurity: Not on file  Transportation Needs: Not on file  Physical Activity: Not on file  Stress: Not on file  Social Connections: Not on file  Intimate Partner Violence: Not on file    Outpatient Medications Prior to Visit  Medication Sig Dispense Refill  . atenolol (TENORMIN) 25 MG tablet TAKE 0.5 TABLETS (12.5 MG TOTAL) BY MOUTH 2 (TWO) TIMES DAILY. 60 tablet 0  . budesonide (PULMICORT) 1 MG/2ML nebulizer solution Take 2 mLs (1 mg total) by nebulization in the morning and at bedtime. 60 mL 0  . busPIRone (BUSPAR) 10 MG tablet Take 10 mg by mouth in the morning and at bedtime.    Marland Kitchen EPINEPHrine (EPIPEN 2-PAK) 0.3 mg/0.3 mL IJ SOAJ injection Inject 0.3 mLs (0.3 mg total) into the muscle once as needed (for severe allergic reaction). CAll  911 immediately if you have to use this medicine 1 Device 2  . gabapentin (NEURONTIN) 600 MG tablet Take 1,800 mg by mouth daily.    Marland Kitchen lithium 600 MG capsule Take 600 mg by mouth daily.    . medroxyPROGESTERone (DEPO-PROVERA) 150 MG/ML injection Inject 1 mL (150 mg total) into the muscle every 3 (three) months. 1 mL 3  . minocycline (MINOCIN) 100 MG capsule Take 100 mg by mouth 2 (two) times daily.    . nitrofurantoin, macrocrystal-monohydrate, (MACROBID) 100 MG capsule Take 1 capsule (100 mg total) by mouth 2 (two) times daily. 10 capsule 0  . Semaglutide,0.25 or 0.5MG /DOS, (OZEMPIC, 0.25 OR 0.5 MG/DOSE,) 2 MG/1.5ML SOPN Inject 0.5 mg into the skin once a week. 1.5 mL 3  . venlafaxine XR (EFFEXOR-XR) 150 MG 24 hr capsule Take 150 mg by mouth daily.    Marland Kitchen venlafaxine XR (EFFEXOR-XR) 37.5 MG 24 hr capsule Take 37.5 mg by mouth daily.     No facility-administered medications  prior to visit.    Allergies  Allergen Reactions  . Cat Hair Extract Anaphylaxis  . Shrimp [Shellfish Allergy] Anaphylaxis and Swelling  . Sulfasalazine Rash  . Sulfa Antibiotics Rash    Review of Systems  Constitutional: Negative.   HENT: Positive for sore throat. Negative for congestion, sinus pain and sneezing.   Respiratory: Negative.   Cardiovascular: Negative.   Endocrine: Negative.   Musculoskeletal: Negative.   Allergic/Immunologic: Negative.   Neurological: Negative.   Hematological: Negative.   Psychiatric/Behavioral: Negative.        Objective:    Physical Exam Constitutional:      Appearance: She is well-developed.  HENT:     Right Ear: Drainage present. No swelling. Tympanic membrane is not erythematous.     Left Ear: Drainage present. No swelling. Tympanic membrane is not erythematous.     Mouth/Throat:     Mouth: Mucous membranes are moist.     Tonsils: No tonsillar exudate or tonsillar abscesses.  Cardiovascular:     Rate and Rhythm: Normal rate and regular rhythm.     Heart sounds: Normal heart sounds.  Pulmonary:     Effort: Pulmonary effort is normal.     Breath sounds: Normal breath sounds.  Musculoskeletal:     Cervical back: Normal range of motion and neck supple.  Lymphadenopathy:     Cervical: No cervical adenopathy.  Skin:    General: Skin is warm and dry.  Neurological:     Mental Status: She is alert.  Psychiatric:        Mood and Affect: Mood normal.        Behavior: Behavior normal.     BP 122/80 (BP Location: Left Arm, Patient Position: Sitting, Cuff Size: Normal)   Pulse 92   Temp 98.5 F (36.9 C) (Temporal)   Ht 5\' 6"  (1.676 m)   Wt 195 lb 6.1 oz (88.6 kg)   SpO2 97%   BMI 31.54 kg/m  Wt Readings from Last 3 Encounters:  12/30/20 195 lb 6.1 oz (88.6 kg)  12/20/20 193 lb 6.1 oz (87.7 kg)  10/27/20 197 lb (89.4 kg)    Health Maintenance Due  Topic Date Due  . COVID-19 Vaccine (3 - Booster) 06/10/2020    There  are no preventive care reminders to display for this patient.   Lab Results  Component Value Date   TSH 1.56 12/29/2020   Lab Results  Component Value Date   WBC 6.9 12/29/2020   HGB 14.0 12/29/2020  HCT 41.3 12/29/2020   MCV 87.0 12/29/2020   PLT 226.0 12/29/2020   Lab Results  Component Value Date   NA 138 12/29/2020   K 4.4 12/29/2020   CO2 25 12/29/2020   GLUCOSE 96 12/29/2020   BUN 9 12/29/2020   CREATININE 0.79 12/29/2020   BILITOT 0.4 12/29/2020   ALKPHOS 70 12/29/2020   AST 20 12/29/2020   ALT 32 12/29/2020   PROT 7.5 12/29/2020   ALBUMIN 4.1 12/29/2020   CALCIUM 9.4 12/29/2020   ANIONGAP 9 11/28/2018   GFR 101.46 12/29/2020   Lab Results  Component Value Date   CHOL 193 12/29/2020   Lab Results  Component Value Date   HDL 33.50 (L) 12/29/2020   Lab Results  Component Value Date   LDLCALC 133 (H) 05/25/2020   Lab Results  Component Value Date   TRIG 384.0 (H) 12/29/2020   Lab Results  Component Value Date   CHOLHDL 6 12/29/2020   Lab Results  Component Value Date   HGBA1C 5.0 05/25/2020       Assessment & Plan:   Problem List Items Addressed This Visit   None   Visit Diagnoses    Sore throat    -  Primary   Relevant Orders   POCT rapid strep A (Completed)   Left ear pain       PND (post-nasal drip)           Ibuprofen or Tylenol as needed for sore throat. Avoid acidic foods. Consider an antihistamine to help with allergy issues. Strep negative   Kennyth Arnold, FNP

## 2021-01-06 NOTE — Telephone Encounter (Signed)
No response from patient.  MyChart message [place] by patient Last [place] by Jerrye Bushy Patrie at 8:31 AM on 12/31/2020. .   Dr. Talbert Nan -please advise.

## 2021-01-06 NOTE — Telephone Encounter (Signed)
Reviewed with Dr. Talbert Nan.  OK to close encounter.   Encounter closed.

## 2021-02-03 ENCOUNTER — Ambulatory Visit (INDEPENDENT_AMBULATORY_CARE_PROVIDER_SITE_OTHER): Payer: Medicare Other

## 2021-02-03 DIAGNOSIS — Z308 Encounter for other contraceptive management: Secondary | ICD-10-CM | POA: Diagnosis not present

## 2021-02-03 MED ORDER — MEDROXYPROGESTERONE ACETATE 150 MG/ML IM SUSP
150.0000 mg | Freq: Once | INTRAMUSCULAR | Status: AC
  Administered 2021-02-03: 150 mg via INTRAMUSCULAR

## 2021-02-03 NOTE — Progress Notes (Signed)
Patient presented to get Medroxyprogesterone 150mg  injection Given on R Deltoid Patient tolerated well advised to return between Aug 10th thru 24th to get next injection  Injection documented on [facility]

## 2021-02-16 ENCOUNTER — Ambulatory Visit: Payer: Medicare Other

## 2021-03-24 ENCOUNTER — Ambulatory Visit: Payer: Medicare Other | Admitting: Physician Assistant

## 2021-03-31 ENCOUNTER — Telehealth (INDEPENDENT_AMBULATORY_CARE_PROVIDER_SITE_OTHER): Payer: Medicare Other | Admitting: Family

## 2021-03-31 ENCOUNTER — Encounter: Payer: Self-pay | Admitting: Family

## 2021-03-31 ENCOUNTER — Other Ambulatory Visit: Payer: Self-pay

## 2021-03-31 VITALS — Ht 66.0 in | Wt 195.3 lb

## 2021-03-31 DIAGNOSIS — J028 Acute pharyngitis due to other specified organisms: Secondary | ICD-10-CM | POA: Diagnosis not present

## 2021-03-31 DIAGNOSIS — B9689 Other specified bacterial agents as the cause of diseases classified elsewhere: Secondary | ICD-10-CM | POA: Diagnosis not present

## 2021-03-31 DIAGNOSIS — R110 Nausea: Secondary | ICD-10-CM | POA: Diagnosis not present

## 2021-03-31 MED ORDER — AZITHROMYCIN 250 MG PO TABS
250.0000 mg | ORAL_TABLET | Freq: Every day | ORAL | 0 refills | Status: DC
Start: 2021-03-31 — End: 2021-04-06

## 2021-03-31 NOTE — Progress Notes (Signed)
Virtual Visit via Video   I connected with patient on 03/31/21 at  9:00 AM EDT by a video enabled telemedicine application and verified that I am speaking with the correct person using two identifiers.  Location patient: Home Location provider: [place], Office Persons participating in the virtual visit: Patient, Provider, Melitta  [place]  I discussed the limitations of evaluation and management by telemedicine and the availability of in person appointments. The patient expressed understanding and agreed to proceed.  Subjective:   HPI:   30 year old female presents via video visit with concerns of sore throat and 1 episode of nausea that occurred last night.  Denies any sneezing, cough, or congestion.  Reports was sick last week with vomiting.  Reports taken a rapid antigen test for COVID-19 that was negative.  She has been vaccinated but is not boosted.  Does report going to [place] is past weekend.  ROS:   See pertinent positives and negatives per HPI.  Patient Active Problem List   Diagnosis Date Noted   Mixed bipolar affective disorder, moderate ([place]) 12/11/2020   Asthma 04/19/2020   Depression 04/19/2020   Borderline personality disorder ([place]) 04/19/2020   PTSD (post-traumatic stress disorder) 08/15/2018   GAD (generalized anxiety disorder) 10/25/2017   Recurrent vertigo 07/12/2017   POTS (postural orthostatic tachycardia syndrome) 09/11/2016   Hx of severe preeclampsia 03/20/2016   Anxiety 04/11/2015   Retinal detachment of left eye with retinal break 08/08/2011    Class: Acute    Social History   Tobacco Use   Smoking status: Never   Smokeless tobacco: Never  Substance Use Topics   Alcohol use: No    Current Outpatient Medications:    atenolol (TENORMIN) 25 MG tablet, TAKE 0.5 TABLETS (12.5 MG TOTAL) BY MOUTH 2 (TWO) TIMES DAILY., Disp: 60 tablet, Rfl: 0   azithromycin (ZITHROMAX Z-PAK) 250 MG tablet, Take 1 tablet (250 mg total) by mouth  daily. 2 tabs po today then 1 tab daily x 4 more days, Disp: 6 tablet, Rfl: 0   budesonide (PULMICORT) 1 MG/2ML nebulizer solution, Take 2 mLs (1 mg total) by nebulization in the morning and at bedtime., Disp: 60 mL, Rfl: 0   busPIRone (BUSPAR) 10 MG tablet, Take 10 mg by mouth in the morning and at bedtime., Disp: , Rfl:    EPINEPHrine (EPIPEN 2-PAK) 0.3 mg/0.3 mL IJ SOAJ injection, Inject 0.3 mLs (0.3 mg total) into the muscle once as needed (for severe allergic reaction). CAll 911 immediately if you have to use this medicine, Disp: 1 Device, Rfl: 2   gabapentin (NEURONTIN) 600 MG tablet, Take 1,800 mg by mouth daily., Disp: , Rfl:    lithium 600 MG capsule, Take 600 mg by mouth daily., Disp: , Rfl:    medroxyPROGESTERone (DEPO-PROVERA) 150 MG/ML injection, Inject 1 mL (150 mg total) into the muscle every 3 (three) months., Disp: 1 mL, Rfl: 3   minocycline (MINOCIN) 100 MG capsule, Take 100 mg by mouth 2 (two) times daily., Disp: , Rfl:    Semaglutide,0.25 or 0.5MG /DOS, (OZEMPIC, 0.25 OR 0.5 MG/DOSE,) 2 MG/1.5ML SOPN, Inject 0.5 mg into the skin once a week., Disp: 1.5 mL, Rfl: 3   venlafaxine XR (EFFEXOR-XR) 150 MG 24 hr capsule, Take 150 mg by mouth daily., Disp: , Rfl:    venlafaxine XR (EFFEXOR-XR) 37.5 MG 24 hr capsule, Take 37.5 mg by mouth daily., Disp: , Rfl:    ondansetron (ZOFRAN-ODT) 4 MG disintegrating tablet, Take 4 mg by mouth every 8 (  eight) hours as needed., Disp: , Rfl:   Allergies  Allergen Reactions   Cat Hair Extract Anaphylaxis   Shrimp [Shellfish Allergy] Anaphylaxis and Swelling   Sulfasalazine Rash   Sulfa Antibiotics Rash    Objective:   Ht 5\' 6"  (1.676 m)   Wt 195 lb 5.2 oz (88.6 kg)   BMI 31.53 kg/m   Patient is well-developed, well-nourished in no acute distress.  Resting comfortably in her car.  Head is normocephalic, atraumatic.  No labored breathing.  Speech is clear and coherent with logical content.  Patient is alert and oriented at baseline.     Assessment and Plan:    Plan: We will cover patient for possible strep pharyngitis.  However, suspicious for COVID-19 despite her rapid antigen test being negative.  Discussed with patient the need to repeat rapid antigen test in 2 days if her symptoms are worsening.  Rest.  Drink plenty of fluids.  Call the office if symptoms worsen or persist.  Kennyth Arnold, FNP 03/31/2021

## 2021-04-06 ENCOUNTER — Ambulatory Visit (INDEPENDENT_AMBULATORY_CARE_PROVIDER_SITE_OTHER): Payer: PRIVATE HEALTH INSURANCE | Admitting: Family Medicine

## 2021-04-06 ENCOUNTER — Other Ambulatory Visit: Payer: Self-pay

## 2021-04-06 ENCOUNTER — Encounter: Payer: Self-pay | Admitting: Family Medicine

## 2021-04-06 VITALS — BP 126/82 | HR 91 | Temp 98.7°F

## 2021-04-06 DIAGNOSIS — J029 Acute pharyngitis, unspecified: Secondary | ICD-10-CM

## 2021-04-06 DIAGNOSIS — Z79899 Other long term (current) drug therapy: Secondary | ICD-10-CM | POA: Diagnosis not present

## 2021-04-06 LAB — POCT RAPID STREP A (OFFICE): Rapid Strep A Screen: NEGATIVE

## 2021-04-06 LAB — MONONUCLEOSIS SCREEN: Mono Screen: NEGATIVE

## 2021-04-06 NOTE — Patient Instructions (Signed)
Please follow up if symptoms do not improve or as needed.    Please complete your antibiotics.  You will be able to see your lithium level result via mychart.   Pharyngitis  Pharyngitis is redness, pain, and swelling (inflammation) of the throat (pharynx). It is a very common cause of sore throat. Pharyngitis can be caused by a bacteria, but it is usually caused by a virus. Most cases of pharyngitis getbetter on their own without treatment. What are the causes? This condition may be caused by: Infection by viruses (viral). Viral pharyngitis spreads from person to person (is contagious) through coughing, sneezing, and sharing of personal items or utensils such as cups, forks, spoons, and toothbrushes. Infection by bacteria (bacterial). Bacterial pharyngitis may be spread by touching the nose or face after coming in contact with the bacteria, or through more intimate contact, such as kissing. Allergies. Allergies can cause buildup of mucus in the throat (post-nasal drip), leading to inflammation and irritation. Allergies can also cause blocked nasal passages, forcing breathing through the mouth, which dries and irritates the throat. What increases the risk? You are more likely to develop this condition if: You are 35-99 years old. You are exposed to crowded environments such as daycare, school, or dormitory living. You live in a cold climate. You have a weakened disease-fighting (immune) system. What are the signs or symptoms? Symptoms of this condition vary by the cause (viral, bacterial, or allergies) and can include: Sore throat. Fatigue. Low-grade fever. Headache. Joint pain and muscle aches. Skin rashes. Swollen glands in the throat (lymph nodes). Plaque-like film on the throat or tonsils. This is often a symptom of bacterial pharyngitis. Vomiting. Stuffy nose (nasal congestion). Cough. Red, itchy eyes (conjunctivitis). Loss of appetite. How is this diagnosed? This condition is  often diagnosed based on your medical history and a physical exam. Your health care provider will ask you questions about your illness and your symptoms. A swab of your throat may be done to check for bacteria (rapid strep test). Other lab tests may also be done, depending on the suspected cause, butthese are rare. How is this treated? This condition usually gets better in 3-4 days without medicine. Bacterialpharyngitis may be treated with antibiotic medicines. Follow these instructions at home: Take over-the-counter and prescription medicines only as told by your health care provider. If you were prescribed an antibiotic medicine, take it as told by your health care provider. Do not stop taking the antibiotic even if you start to feel better. Do not give children aspirin because of the association with Reye syndrome. Drink enough water and fluids to keep your urine clear or pale yellow. Get a lot of rest. Gargle with a salt-water mixture 3-4 times a day or as needed. To make a salt-water mixture, completely dissolve -1 tsp of salt in 1 cup of warm water. Do not swallow this mixture. If your health care provider approves, you may use throat lozenges or sprays to soothe your throat. Contact a health care provider if: You have large, tender lumps in your neck. You have a rash. You cough up green, yellow-brown, or bloody spit. Get help right away if: Your neck becomes stiff. You drool or are unable to swallow liquids. You cannot drink or take medicines without vomiting. You have severe pain that does not go away, even after you take medicine. You have trouble breathing, and it is not caused by a stuffy nose. You have new pain and swelling in your joints such as the  knees, ankles, wrists, or elbows. Summary Pharyngitis is redness, pain, and swelling (inflammation) of the throat (pharynx). While pharyngitis can be caused by a bacteria, the most common causes are viral. Most cases of  pharyngitis get better on their own without treatment. Bacterial pharyngitis is treated with antibiotic medicines. This information is not intended to replace advice given to you by your health care provider. Make sure you discuss any questions you have with your healthcare provider. Document Revised: 07/29/2020 Document Reviewed: 07/29/2020 Elsevier Patient Education  2022 [employer].

## 2021-04-06 NOTE — Progress Notes (Signed)
Subjective  CC:  Chief Complaint  Patient presents with   Sore Throat    Started a week ago, two negative COVID tests, white spots in her throat    Same day acute visit; PCP not available. New pt to me. Chart reviewed.    HPI: Kathy Zimmerman is a 30 y.o. female who presents to the office today to address the problems listed above in the chief complaint. Reviewed recent virtual health visit for sore throat.  Patient was given Z-Pak to cover for possible bacterial pharyngitis.  Patient reports sore throat that started over a week ago.  Symptoms include mild congestion.  She feels warm but does not have any fevers.  She denies cough, shortness of breath or GI symptoms.  No known exposures.  She did take 2 COVID test 3 days apart and both were negative.  She is vaccinated against COVID.  She is unable to take NSAIDs due to risk because of lithium use.  She has been using Tylenol without improvement.  Eating and drinking okay.  She did take 1 dose of the Z-Pak but then stopped.  She started her second dose yesterday.  No other sick contacts at home. Mood disorder managed by psychiatry.  Requesting lithium level.  Last dose last night.  Assessment  1. Acute pharyngitis, unspecified etiology   2. High risk medication use      Plan  Acute pharyngitis: Possible bacterial versus viral.  Education given.  Patient to complete Z-Pak, await strep culture although result will not be very helpful if negative given that she has had 2 doses of antibiotics prior.  Patient understands this.  We will also check for mono.  Supportive care.  See after visit summary for instructions. Check lithium level and sent to psychiatry for management.  Follow up: As scheduled 04/21/2021  Orders Placed This Encounter  Procedures   Culture, Group A Strep   Monospot   Lithium level   POCT rapid strep A   No orders of the defined types were placed in this encounter.     I reviewed the patients updated PMH, FH,  and SocHx.    Patient Active Problem List   Diagnosis Date Noted   Mixed bipolar affective disorder, moderate ([place]) 12/11/2020   Asthma 04/19/2020   Depression 04/19/2020   Borderline personality disorder ([place]) 04/19/2020   PTSD (post-traumatic stress disorder) 08/15/2018   GAD (generalized anxiety disorder) 10/25/2017   Recurrent vertigo 07/12/2017   POTS (postural orthostatic tachycardia syndrome) 09/11/2016   Hx of severe preeclampsia 03/20/2016   Anxiety 04/11/2015   Retinal detachment of left eye with retinal break 08/08/2011    Class: Acute   Current Meds  Medication Sig   atenolol (TENORMIN) 25 MG tablet TAKE 0.5 TABLETS (12.5 MG TOTAL) BY MOUTH 2 (TWO) TIMES DAILY.   budesonide (PULMICORT) 1 MG/2ML nebulizer solution Take 2 mLs (1 mg total) by nebulization in the morning and at bedtime.   busPIRone (BUSPAR) 10 MG tablet Take 10 mg by mouth in the morning and at bedtime.   EPINEPHrine (EPIPEN 2-PAK) 0.3 mg/0.3 mL IJ SOAJ injection Inject 0.3 mLs (0.3 mg total) into the muscle once as needed (for severe allergic reaction). CAll 911 immediately if you have to use this medicine   gabapentin (NEURONTIN) 600 MG tablet Take 1,800 mg by mouth daily.   lithium 600 MG capsule Take 600 mg by mouth daily.   medroxyPROGESTERone (DEPO-PROVERA) 150 MG/ML injection Inject 1 mL (150 mg total) into  the muscle every 3 (three) months.   minocycline (MINOCIN) 100 MG capsule Take 100 mg by mouth 2 (two) times daily.   ondansetron (ZOFRAN-ODT) 4 MG disintegrating tablet Take 4 mg by mouth every 8 (eight) hours as needed.   Semaglutide,0.25 or 0.'5MG'$ /DOS, (OZEMPIC, 0.25 OR 0.5 MG/DOSE,) 2 MG/1.5ML SOPN Inject 0.5 mg into the skin once a week.   venlafaxine XR (EFFEXOR-XR) 150 MG 24 hr capsule Take 150 mg by mouth daily.   venlafaxine XR (EFFEXOR-XR) 37.5 MG 24 hr capsule Take 37.5 mg by mouth daily.    Allergies: Patient is allergic to cat hair extract, shrimp [shellfish allergy], sulfasalazine, and  sulfa antibiotics. Family History: Patient family history includes CAD in her maternal grandfather and another family member; Depression in her father, maternal grandmother, and sister; Heart disease in her maternal grandfather; Hyperlipidemia in her maternal grandfather; Hypertension in her maternal grandfather; Mental illness in her maternal grandmother and sister; Thyroid disease in her maternal grandmother and another family member. Social History:  Patient  reports that she has never smoked. She has never used smokeless tobacco. She reports that she does not drink alcohol and does not use drugs.  Review of Systems: Constitutional: Negative for fever malaise or anorexia Cardiovascular: negative for chest pain Respiratory: negative for SOB or persistent cough Gastrointestinal: negative for abdominal pain  Objective  Vitals: BP 126/82   Pulse 91   Temp 98.7 F (37.1 C) (Temporal)   SpO2 99%  General: no acute distress , A&Ox3, no respiratory distress HEENT: PEERL, conjunctiva normal, neck is supple, TMs normal bilaterally, oropharynx erythematous with exudate, +3 tonsils, positive cervical lymphadenopathy Cardiovascular:  RRR without murmur or gallop.  Respiratory:  Good breath sounds bilaterally, CTAB with normal respiratory effort Skin:  Warm, no rashes  No visits with results within 1 Day(s) from this visit.  Latest known visit with results is:  Office Visit on 12/30/2020  Component Date Value Ref Range Status   Rapid Strep A Screen 12/30/2020 Negative  Negative Final     Commons side effects, risks, benefits, and alternatives for medications and treatment plan prescribed today were discussed, and the patient expressed understanding of the given instructions. Patient is instructed to call or message via MyChart if he/she has any questions or concerns regarding our treatment plan. No barriers to understanding were identified. We discussed Red Flag symptoms and signs in detail.  Patient expressed understanding regarding what to do in case of urgent or emergency type symptoms.  Medication list was reconciled, printed and provided to the patient in AVS. Patient instructions and summary information was reviewed with the patient as documented in the AVS. This note was prepared with assistance of Dragon voice recognition software. Occasional wrong-word or sound-a-like substitutions may have occurred due to the inherent limitations of voice recognition software  This visit occurred during the SARS-CoV-2 public health emergency.  Safety protocols were in place, including screening questions prior to the visit, additional usage of staff PPE, and extensive cleaning of exam room while observing appropriate contact time as indicated for disinfecting solutions.

## 2021-04-08 LAB — CULTURE, GROUP A STREP
MICRO NUMBER:: 12169341
SPECIMEN QUALITY:: ADEQUATE

## 2021-04-08 LAB — LITHIUM LEVEL: Lithium Lvl: 0.6 mmol/L (ref 0.6–1.2)

## 2021-04-19 ENCOUNTER — Other Ambulatory Visit: Payer: Self-pay | Admitting: Cardiovascular Disease

## 2021-04-21 ENCOUNTER — Ambulatory Visit (INDEPENDENT_AMBULATORY_CARE_PROVIDER_SITE_OTHER): Payer: Medicare Other

## 2021-04-21 DIAGNOSIS — Z308 Encounter for other contraceptive management: Secondary | ICD-10-CM | POA: Diagnosis not present

## 2021-04-21 MED ORDER — MEDROXYPROGESTERONE ACETATE 150 MG/ML IM SUSP
150.0000 mg | Freq: Once | INTRAMUSCULAR | Status: AC
  Administered 2021-04-21: 150 mg via INTRAMUSCULAR

## 2021-04-21 NOTE — Progress Notes (Signed)
Per orders of Kathy Zimmerman, [place], injection of Depo-Provera  given by [name] in left deltoid. Patient tolerated injection well. Patient will make appointment for 3 months.

## 2021-07-04 ENCOUNTER — Encounter: Payer: Self-pay | Admitting: Physician Assistant

## 2021-07-06 ENCOUNTER — Encounter: Payer: Self-pay | Admitting: Physician Assistant

## 2021-07-06 ENCOUNTER — Telehealth (INDEPENDENT_AMBULATORY_CARE_PROVIDER_SITE_OTHER): Payer: Medicare Other | Admitting: Physician Assistant

## 2021-07-06 VITALS — Ht 66.0 in | Wt 200.0 lb

## 2021-07-06 DIAGNOSIS — R197 Diarrhea, unspecified: Secondary | ICD-10-CM | POA: Diagnosis not present

## 2021-07-06 DIAGNOSIS — E669 Obesity, unspecified: Secondary | ICD-10-CM

## 2021-07-06 MED ORDER — ONDANSETRON HCL 4 MG PO TABS: 4.0000 mg | ORAL_TABLET | Freq: Three times a day (TID) | ORAL | 0 refills | Status: DC | PRN

## 2021-07-06 MED ORDER — LOPERAMIDE HCL 2 MG PO TABS: 2.0000 mg | ORAL_TABLET | Freq: Four times a day (QID) | ORAL | 0 refills | Status: DC | PRN

## 2021-07-06 NOTE — Progress Notes (Signed)
Virtual Visit via Video   I connected with Kathy Zimmerman on 07/06/21 at  2:00 PM EDT by a video enabled telemedicine application and verified that I am speaking with the correct person using two identifiers. Location patient: Home Location provider: [facility], Office Persons participating in the virtual visit: Jamarie Joplin Opfer, [name] PA-C, Anselmo Pickler, LPN   I discussed the limitations of evaluation and management by telemedicine and the availability of in person appointments. The patient expressed understanding and agreed to proceed.  I acted as a Education administrator for [employer], [employer], LPN   Subjective:   HPI:   Obesity Pt would like to discuss weight medications. She is presently not on anything. She was on ozempic in the past and did not have side effects with this but did feel like it was slightly helpful. She stopped this due to running out of prescription. She is working on [employer] and has recently joined a gym.  Diarrhea Occasionally has diarrhea spells and nausea/vomiting. When she was on ozempic and saxenda in the past she did not have worsening symptoms. She has relief with occasional zofran and immodium and would like refill on this today. Denies: rectal bleeding, unintentional weight loss, severe abdominal pain.  ROS: See pertinent positives and negatives per HPI.  Patient Active Problem List   Diagnosis Date Noted   Mixed bipolar affective disorder, moderate ([place]) 12/11/2020   Asthma 04/19/2020   Depression 04/19/2020   Borderline personality disorder ([place]) 04/19/2020   PTSD (post-traumatic stress disorder) 08/15/2018   GAD (generalized anxiety disorder) 10/25/2017   Recurrent vertigo 07/12/2017   POTS (postural orthostatic tachycardia syndrome) 09/11/2016   Hx of severe preeclampsia 03/20/2016   Anxiety 04/11/2015   Retinal detachment of left eye with retinal break 08/08/2011    Class: Acute    Social History   Tobacco  Use   Smoking status: Never   Smokeless tobacco: Never  Substance Use Topics   Alcohol use: No    Current Outpatient Medications:    atenolol (TENORMIN) 25 MG tablet, TAKE 0.5 TABLETS (12.5 MG TOTAL) BY MOUTH 2 (TWO) TIMES DAILY., Disp: 60 tablet, Rfl: 0   budesonide (PULMICORT) 1 MG/2ML nebulizer solution, Take 2 mLs (1 mg total) by nebulization in the morning and at bedtime., Disp: 60 mL, Rfl: 0   busPIRone (BUSPAR) 10 MG tablet, Take 10 mg by mouth in the morning and at bedtime., Disp: , Rfl:    chlorproMAZINE (THORAZINE) 50 MG tablet, Take 50 mg by mouth at bedtime., Disp: , Rfl:    EPINEPHrine (EPIPEN 2-PAK) 0.3 mg/0.3 mL IJ SOAJ injection, Inject 0.3 mLs (0.3 mg total) into the muscle once as needed (for severe allergic reaction). CAll 911 immediately if you have to use this medicine, Disp: 1 Device, Rfl: 2   gabapentin (NEURONTIN) 600 MG tablet, Take 1,800 mg by mouth daily., Disp: , Rfl:    lithium 600 MG capsule, Take 600 mg by mouth daily., Disp: , Rfl:    loperamide (IMODIUM A-D) 2 MG tablet, Take 1 tablet (2 mg total) by mouth 4 (four) times daily as needed for diarrhea or loose stools., Disp: 30 tablet, Rfl: 0   LORazepam (ATIVAN) 0.5 MG tablet, Take 0.5 mg by mouth daily as needed., Disp: , Rfl:    medroxyPROGESTERone (DEPO-PROVERA) 150 MG/ML injection, Inject 1 mL (150 mg total) into the muscle every 3 (three) months., Disp: 1 mL, Rfl: 3   minocycline (MINOCIN) 100 MG capsule, Take 100  mg by mouth 2 (two) times daily., Disp: , Rfl:    ondansetron (ZOFRAN) 4 MG tablet, Take 1 tablet (4 mg total) by mouth every 8 (eight) hours as needed for nausea or vomiting., Disp: 30 tablet, Rfl: 0   venlafaxine XR (EFFEXOR-XR) 150 MG 24 hr capsule, Take 150 mg by mouth daily., Disp: , Rfl:    venlafaxine XR (EFFEXOR-XR) 37.5 MG 24 hr capsule, Take 37.5 mg by mouth daily., Disp: , Rfl:   Allergies  Allergen Reactions   Cat Hair Extract Anaphylaxis   Shrimp [Shellfish Allergy] Anaphylaxis and  Swelling   Sulfasalazine Rash   Sulfa Antibiotics Rash    Objective:   VITALS: Per patient if applicable, see vitals. GENERAL: Alert, appears well and in no acute distress. HEENT: Atraumatic, conjunctiva clear, no obvious abnormalities on inspection of external nose and ears. NECK: Normal movements of the head and neck. CARDIOPULMONARY: No increased WOB. Speaking in clear sentences. I:E ratio WNL.  MS: Moves all visible extremities without noticeable abnormality. PSYCH: Pleasant and cooperative, well-groomed. Speech normal rate and rhythm. Affect is appropriate. Insight and judgement are appropriate. Attention is focused, linear, and appropriate.  NEURO: CN grossly intact. Oriented as arrived to appointment on time with no prompting. Moves both UE equally.  SKIN: No obvious lesions, wounds, erythema, or cyanosis noted on face or hands.  Assessment and Plan:   Elgie was seen today for weight management.  Diagnoses and all orders for this visit:  Obesity, unspecified classification, unspecified obesity type, unspecified whether serious comorbidity present Will trial mounjaro Did discuss that this could exacerbate her GI issues but she would like to trial as she did tolerate GLP-1's well in the past  No hx of pancreatitis or fam hx of thyroid cancer Asked patient to let us know if she would like to continue this after a few weeks Follow-up in 3 months, sooner if concerns  Diarrhea, unspecified type No red flags on discussion Will send in oral imodium and zofran for prn use If new/worsening symptoms develop, she will let us know Consider GI referral if indicated  Other orders -     loperamide (IMODIUM A-D) 2 MG tablet; Take 1 tablet (2 mg total) by mouth 4 (four) times daily as needed for diarrhea or loose stools. -     ondansetron (ZOFRAN) 4 MG tablet; Take 1 tablet (4 mg total) by mouth every 8 (eight) hours as needed for nausea or vomiting.   I discussed the assessment and  treatment plan with the patient. The patient was provided an opportunity to ask questions and all were answered. The patient agreed with the plan and demonstrated an understanding of the instructions.   The patient was advised to call back or seek an in-person evaluation if the symptoms worsen or if the condition fails to improve as anticipated.   CMA or LPN served as scribe during this visit. History, Physical, and Plan performed by medical provider. The above documentation has been reviewed and is accurate and complete.   [place], [place] 07/06/2021

## 2021-07-20 ENCOUNTER — Ambulatory Visit (INDEPENDENT_AMBULATORY_CARE_PROVIDER_SITE_OTHER): Payer: Medicare Other

## 2021-07-20 ENCOUNTER — Other Ambulatory Visit: Payer: Self-pay

## 2021-07-20 DIAGNOSIS — Z30013 Encounter for initial prescription of injectable contraceptive: Secondary | ICD-10-CM

## 2021-07-20 MED ORDER — MEDROXYPROGESTERONE ACETATE 150 MG/ML IM SUSP
150.0000 mg | Freq: Once | INTRAMUSCULAR | Status: AC
  Administered 2021-07-20: 150 mg via INTRAMUSCULAR

## 2021-07-20 NOTE — Progress Notes (Signed)
Per orders of [name] PA,Injection of Depo Provera given by Loralyn Freshwater

## 2021-07-21 ENCOUNTER — Ambulatory Visit: Payer: No Typology Code available for payment source

## 2021-08-09 ENCOUNTER — Encounter: Payer: Self-pay | Admitting: Physician Assistant

## 2021-08-09 MED ORDER — TIRZEPATIDE 5 MG/0.5ML ~~LOC~~ SOAJ: 5.0000 mg | SUBCUTANEOUS | 0 refills | Status: DC

## 2021-09-02 ENCOUNTER — Telehealth: Payer: PRIVATE HEALTH INSURANCE | Admitting: Nurse Practitioner

## 2021-09-02 DIAGNOSIS — R6889 Other general symptoms and signs: Secondary | ICD-10-CM

## 2021-09-02 MED ORDER — OSELTAMIVIR PHOSPHATE 75 MG PO CAPS: 75.0000 mg | ORAL_CAPSULE | Freq: Two times a day (BID) | ORAL | 0 refills | Status: DC

## 2021-09-02 NOTE — Patient Instructions (Signed)
Influenza, Adult °Influenza is also called "the flu." It is an infection in the lungs, nose, and throat (respiratory tract). It spreads easily from person to person (is contagious). The flu causes symptoms that are like a cold, along with high fever and body aches. °What are the causes? °This condition is caused by the influenza virus. You can get the virus by: °Breathing in droplets that are in the air after a person infected with the flu coughed or sneezed. °Touching something that has the virus on it and then touching your mouth, nose, or eyes. °What increases the risk? °Certain things may make you more likely to get the flu. These include: °Not washing your hands often. °Having close contact with many people during cold and flu season. °Touching your mouth, eyes, or nose without first washing your hands. °Not getting a flu shot every year. °You may have a higher risk for the flu, and serious problems, such as a lung infection (pneumonia), if you: °Are older than 65. °Are pregnant. °Have a weakened disease-fighting system (immune system) because of a disease or because you are taking certain medicines. °Have a long-term (chronic) condition, such as: °Heart, kidney, or lung disease. °Diabetes. °Asthma. °Have a liver disorder. °Are very overweight (morbidly obese). °Have anemia. °What are the signs or symptoms? °Symptoms usually begin suddenly and last 4-14 days. They may include: °Fever and chills. °Headaches, body aches, or muscle aches. °Sore throat. °Cough. °Runny or stuffy (congested) nose. °Feeling discomfort in your chest. °Not wanting to eat as much as normal. °Feeling weak or tired. °Feeling dizzy. °Feeling sick to your stomach or throwing up. °How is this treated? °If the flu is found early, you can be treated with antiviral medicine. This can help to reduce how bad the illness is and how long it lasts. This may be given by mouth or through an IV tube. °Taking care of yourself at home can help your  symptoms get better. Your doctor may want you to: °Take over-the-counter medicines. °Drink plenty of fluids. °The flu often goes away on its own. If you have very bad symptoms or other problems, you may be treated in a hospital. °Follow these instructions at home: °  °Activity °Rest as needed. Get plenty of sleep. °Stay home from work or school as told by your doctor. °Do not leave home until you do not have a fever for 24 hours without taking medicine. °Leave home only to go to your doctor. °Eating and drinking °Take an ORS (oral rehydration solution). This is a drink that is sold at pharmacies and stores. °Drink enough fluid to keep your pee pale yellow. °Drink clear fluids in small amounts as you are able. Clear fluids include: °Water. °Ice chips. °Fruit juice mixed with water. °Low-calorie sports drinks. °Eat bland foods that are easy to digest. Eat small amounts as you are able. These foods include: °Bananas. °Applesauce. °Rice. °Lean meats. °Toast. °Crackers. °Do not eat or drink: °Fluids that have a lot of sugar or caffeine. °Alcohol. °Spicy or fatty foods. °General instructions °Take over-the-counter and prescription medicines only as told by your doctor. °Use a cool mist humidifier to add moisture to the air in your home. This can make it easier for you to breathe. °When using a cool mist humidifier, clean it daily. Empty water and replace with clean water. °Cover your mouth and nose when you cough or sneeze. °Wash your hands with soap and water often and for at least 20 seconds. This is also important after   you cough or sneeze. If you cannot use soap and water, use alcohol-based hand sanitizer. °Keep all follow-up visits. °How is this prevented? ° °Get a flu shot every year. You may get the flu shot in late summer, fall, or winter. Ask your doctor when you should get your flu shot. °Avoid contact with people who are sick during fall and winter. This is cold and flu season. °Contact a doctor if: °You get  new symptoms. °You have: °Chest pain. °Watery poop (diarrhea). °A fever. °Your cough gets worse. °You start to have more mucus. °You feel sick to your stomach. °You throw up. °Get help right away if you: °Have shortness of breath. °Have trouble breathing. °Have skin or nails that turn a bluish color. °Have very bad pain or stiffness in your neck. °Get a sudden headache. °Get sudden pain in your face or ear. °Cannot eat or drink without throwing up. °These symptoms may represent a serious problem that is an emergency. Get medical help right away. Call your local emergency services (911 in the [place] °Do not wait to see if the symptoms will go away. °Do not drive yourself to the hospital. °Summary °Influenza is also called "the flu." It is an infection in the lungs, nose, and throat. It spreads easily from person to person. °Take over-the-counter and prescription medicines only as told by your doctor. °Getting a flu shot every year is the best way to not get the flu. °This information is not intended to replace advice given to you by your health care provider. Make sure you discuss any questions you have with your health care provider. °Document Revised: 04/16/2020 Document Reviewed: 04/16/2020 °Elsevier Patient Education © 2022 Else[employer] Inc. ° °

## 2021-09-02 NOTE — Progress Notes (Signed)
Virtual Visit Consent   Kathy Zimmerman, you are scheduled for a virtual visit with Mary-Margaret Hassell Done, [place], a [facility] provider, today.     Just as with appointments in the office, your consent must be obtained to participate.  Your consent will be active for this visit and any virtual visit you may have with one of our providers in the next 365 days.     If you have a MyChart account, a copy of this consent can be sent to you electronically.  All virtual visits are billed to your insurance company just like a traditional visit in the office.    As this is a virtual visit, video technology does not allow for your provider to perform a traditional examination.  This may limit your provider's ability to fully assess your condition.  If your provider identifies any concerns that need to be evaluated in person or the need to arrange testing (such as labs, EKG, etc.), we will make arrangements to do so.     Although advances in technology are sophisticated, we cannot ensure that it will always work on either your end or our end.  If the connection with a video visit is poor, the visit may have to be switched to a telephone visit.  With either a video or telephone visit, we are not always able to ensure that we have a secure connection.     I need to obtain your verbal consent now.   Are you willing to proceed with your visit today? YES   Zera Markwardt Illingworth has provided verbal consent on 09/02/2021 for a virtual visit (video or telephone).   Mary-Margaret Hassell Done, FNP   Date: 09/02/2021 7:15 PM   Virtual Visit via Video Note   I, Mary-Margaret Hassell Done, connected with Tyah Acord Hymes ([id number], [birth year]) on 09/02/21 at  7:30 PM EST by a video-enabled telemedicine application and verified that I am speaking with the correct person using two identifiers.  Location: Patient: Virtual Visit Location Patient: Home Provider: Virtual Visit Location Provider: Mobile   I discussed the  limitations of evaluation and management by telemedicine and the availability of in person appointments. The patient expressed understanding and agreed to proceed.    History of Present Illness: Kathy Zimmerman is a 30 y.o. who identifies as a female who was assigned female at birth, and is being seen today for congestion.  HPI: Patient says she developed a sore throat yesterday with body aches and feels weak. Fever but  not sure how high. Her kids were sick last week from day care, where flu was going around.  URI  This is a new problem. The current episode started yesterday. The problem has been gradually worsening. The maximum temperature recorded prior to her arrival was 100.4 - 100.9 F. The fever has been present for Less than 1 day. Associated symptoms include congestion, headaches and a sore throat. Pertinent negatives include no coughing. She has tried antihistamine for the symptoms. The treatment provided no relief.   Review of Systems  HENT:  Positive for congestion and sore throat.   Respiratory:  Negative for cough.   Neurological:  Positive for headaches.   Problems:  Patient Active Problem List   Diagnosis Date Noted   Mixed bipolar affective disorder, moderate ([place]) 12/11/2020   Asthma 04/19/2020   Depression 04/19/2020   Borderline personality disorder ([place]) 04/19/2020   PTSD (post-traumatic stress disorder) 08/15/2018   GAD (generalized anxiety disorder) 10/25/2017   Recurrent vertigo  07/12/2017   POTS (postural orthostatic tachycardia syndrome) 09/11/2016   Hx of severe preeclampsia 03/20/2016   Anxiety 04/11/2015   Retinal detachment of left eye with retinal break 08/08/2011    Class: Acute    Allergies:  Allergies  Allergen Reactions   Cat Hair Extract Anaphylaxis   Shrimp [Shellfish Allergy] Anaphylaxis and Swelling   Sulfasalazine Rash   Sulfa Antibiotics Rash   Medications:  Current Outpatient Medications:    atenolol (TENORMIN) 25 MG tablet, TAKE 0.5  TABLETS (12.5 MG TOTAL) BY MOUTH 2 (TWO) TIMES DAILY., Disp: 60 tablet, Rfl: 0   budesonide (PULMICORT) 1 MG/2ML nebulizer solution, Take 2 mLs (1 mg total) by nebulization in the morning and at bedtime., Disp: 60 mL, Rfl: 0   busPIRone (BUSPAR) 10 MG tablet, Take 10 mg by mouth in the morning and at bedtime., Disp: , Rfl:    chlorproMAZINE (THORAZINE) 50 MG tablet, Take 50 mg by mouth at bedtime., Disp: , Rfl:    EPINEPHrine (EPIPEN 2-PAK) 0.3 mg/0.3 mL IJ SOAJ injection, Inject 0.3 mLs (0.3 mg total) into the muscle once as needed (for severe allergic reaction). CAll 911 immediately if you have to use this medicine, Disp: 1 Device, Rfl: 2   gabapentin (NEURONTIN) 600 MG tablet, Take 1,800 mg by mouth daily., Disp: , Rfl:    lithium 600 MG capsule, Take 600 mg by mouth daily., Disp: , Rfl:    loperamide (IMODIUM A-D) 2 MG tablet, Take 1 tablet (2 mg total) by mouth 4 (four) times daily as needed for diarrhea or loose stools., Disp: 30 tablet, Rfl: 0   LORazepam (ATIVAN) 0.5 MG tablet, Take 0.5 mg by mouth daily as needed., Disp: , Rfl:    medroxyPROGESTERone (DEPO-PROVERA) 150 MG/ML injection, Inject 1 mL (150 mg total) into the muscle every 3 (three) months., Disp: 1 mL, Rfl: 3   minocycline (MINOCIN) 100 MG capsule, Take 100 mg by mouth 2 (two) times daily., Disp: , Rfl:    ondansetron (ZOFRAN) 4 MG tablet, Take 1 tablet (4 mg total) by mouth every 8 (eight) hours as needed for nausea or vomiting., Disp: 30 tablet, Rfl: 0   tirzepatide (MOUNJARO) 5 MG/0.5ML Pen, Inject 5 mg into the skin once a week., Disp: 2 mL, Rfl: 0   venlafaxine XR (EFFEXOR-XR) 150 MG 24 hr capsule, Take 150 mg by mouth daily., Disp: , Rfl:    venlafaxine XR (EFFEXOR-XR) 37.5 MG 24 hr capsule, Take 37.5 mg by mouth daily., Disp: , Rfl:   Observations/Objective: Patient is well-developed, well-nourished in no acute distress.  Resting comfortably  at home.  Head is normocephalic, atraumatic.  No labored breathing.  Speech  is clear and coherent with logical content.  Patient is alert and oriented at baseline.  Raspy voice No cough  Assessment and Plan:  Marylee Belzer Horsman in today with chief complaint of URI   1. Flu-like symptoms   1. Take meds as prescribed 2. Use a cool mist humidifier especially during the winter months and when heat has been humid. 3. Use saline nose sprays frequently 4. Saline irrigations of the nose can be very helpful if done frequently.  * 4X daily for 1 week*  * Use of a nettie pot can be helpful with this. Follow directions with this* 5. Drink plenty of fluids 6. Keep thermostat turn down low 7.For any cough or congestion- robitussin OTC 8. For fever or aces or pains- take tylenol or ibuprofen appropriate for age and weight.  *  for fevers greater than 101 orally you may alternate ibuprofen and tylenol every  3 hours.   Meds ordered this encounter  Medications   oseltamivir (TAMIFLU) 75 MG capsule    Sig: Take 1 capsule (75 mg total) by mouth 2 (two) times daily.    Dispense:  10 capsule    Refill:  0    Order Specific Question:   Supervising Provider    Answer:   Noemi Chapel [[id number]]     Follow Up Instructions: I discussed the assessment and treatment plan with the patient. The patient was provided an opportunity to ask questions and all were answered. The patient agreed with the plan and demonstrated an understanding of the instructions.  A copy of instructions were sent to the patient via MyChart.  The patient was advised to call back or seek an in-person evaluation if the symptoms worsen or if the condition fails to improve as anticipated.  Time:  I spent 10 minutes with the patient via telehealth technology discussing the above problems/concerns.    Mary-Margaret Hassell Done, FNP

## 2021-09-13 ENCOUNTER — Other Ambulatory Visit: Payer: Self-pay | Admitting: Physician Assistant

## 2021-09-13 MED ORDER — TIRZEPATIDE 5 MG/0.5ML ~~LOC~~ SOAJ: 5.0000 mg | SUBCUTANEOUS | 0 refills | Status: DC

## 2021-09-21 ENCOUNTER — Encounter: Payer: Self-pay | Admitting: Physician Assistant

## 2021-09-21 ENCOUNTER — Telehealth (INDEPENDENT_AMBULATORY_CARE_PROVIDER_SITE_OTHER): Payer: No Typology Code available for payment source | Admitting: Physician Assistant

## 2021-09-21 ENCOUNTER — Other Ambulatory Visit: Payer: Self-pay

## 2021-09-21 ENCOUNTER — Telehealth: Payer: Self-pay | Admitting: *Deleted

## 2021-09-21 VITALS — Ht 66.0 in | Wt 178.0 lb

## 2021-09-21 DIAGNOSIS — R110 Nausea: Secondary | ICD-10-CM | POA: Diagnosis not present

## 2021-09-21 DIAGNOSIS — G90A Postural orthostatic tachycardia syndrome (POTS): Secondary | ICD-10-CM

## 2021-09-21 DIAGNOSIS — R12 Heartburn: Secondary | ICD-10-CM

## 2021-09-21 DIAGNOSIS — E663 Overweight: Secondary | ICD-10-CM | POA: Diagnosis not present

## 2021-09-21 MED ORDER — ONDANSETRON HCL 4 MG PO TABS: 4.0000 mg | ORAL_TABLET | Freq: Three times a day (TID) | ORAL | 0 refills | Status: DC | PRN

## 2021-09-21 MED ORDER — PANTOPRAZOLE SODIUM 40 MG PO TBEC: 40.0000 mg | DELAYED_RELEASE_TABLET | Freq: Every day | ORAL | 1 refills | Status: DC

## 2021-09-21 MED ORDER — TIRZEPATIDE 7.5 MG/0.5ML ~~LOC~~ SOAJ: 7.5000 mg | SUBCUTANEOUS | 0 refills | Status: DC

## 2021-09-21 MED ORDER — ATENOLOL 25 MG PO TABS: 12.5000 mg | ORAL_TABLET | Freq: Two times a day (BID) | ORAL | 1 refills | Status: DC

## 2021-09-21 NOTE — Telephone Encounter (Signed)
Pt done.

## 2021-09-21 NOTE — Telephone Encounter (Signed)
Left message on voicemail to call office. Called pt to see if we could do virtual visit earlier.

## 2021-09-21 NOTE — Progress Notes (Signed)
I acted as a Education administrator for [employer], PA-C Anselmo Pickler, LPN  Virtual Visit via Video Note   I, [name], connected with  Kathy Zimmerman  ([id number], 1990-11-16) on 09/21/21 at  1:00 PM EST by a video-enabled telemedicine application and verified that I am speaking with the correct person using two identifiers.  Location: Patient: Home Provider: [place] office   I discussed the limitations of evaluation and management by telemedicine and the availability of in person appointments. The patient expressed understanding and agreed to proceed.    History of Present Illness: Kathy Zimmerman is a 31 y.o. who identifies as a female who was assigned female at birth, and is being seen today for weight management.   Overweight Pt is currently taking Mounjaro 5 mg once a week. Pt has been having nausea everyday and vomiting once a week. She has run out of zofran and needs a refill. Pt is down 30 pounds since starting Mounjaro.  Wt Readings from Last 4 Encounters:  09/21/21 178 lb (80.7 kg)  07/06/21 200 lb (90.7 kg)  03/31/21 195 lb 5.2 oz (88.6 kg)  12/30/20 195 lb 6.1 oz (88.6 kg)   Having positional lightheadedness. Feels like she is not eating enough at times. She has not changed her activity. Goal weight is 130-140 lb  Heartburn Having some intermittent issues with heartburn. Would like daily medication sent in. Denies: rectal bleeding, abdominal pain, inability to keep po's down. Trying her best to avoid trigger-foods.  POTS Needs refill on atenolol -- currently takes 12.5 mg in AM. Rarely takes PM dose. This helps with the tachycardia that she sometimes experiences. Denies significant changes in her symptoms, other than the slight increase in positional lightheadedness that she feel is possibly related to her Mounjaro and decreased oral intake.  Problems:  Patient Active Problem List   Diagnosis Date Noted   Mixed bipolar affective disorder,  moderate ([place]) 12/11/2020   Asthma 04/19/2020   Depression 04/19/2020   Borderline personality disorder ([place]) 04/19/2020   PTSD (post-traumatic stress disorder) 08/15/2018   GAD (generalized anxiety disorder) 10/25/2017   Recurrent vertigo 07/12/2017   POTS (postural orthostatic tachycardia syndrome) 09/11/2016   Hx of severe preeclampsia 03/20/2016   Anxiety 04/11/2015   Retinal detachment of left eye with retinal break 08/08/2011    Class: Acute    Allergies:  Allergies  Allergen Reactions   Cat Hair Extract Anaphylaxis   Shrimp [Shellfish Allergy] Anaphylaxis and Swelling   Sulfasalazine Rash   Sulfa Antibiotics Rash   Medications:  Current Outpatient Medications:    budesonide (PULMICORT) 1 MG/2ML nebulizer solution, Take 2 mLs (1 mg total) by nebulization in the morning and at bedtime., Disp: 60 mL, Rfl: 0   busPIRone (BUSPAR) 10 MG tablet, Take 10 mg by mouth in the morning and at bedtime., Disp: , Rfl:    chlorproMAZINE (THORAZINE) 50 MG tablet, Take 50 mg by mouth at bedtime., Disp: , Rfl:    EPINEPHrine (EPIPEN 2-PAK) 0.3 mg/0.3 mL IJ SOAJ injection, Inject 0.3 mLs (0.3 mg total) into the muscle once as needed (for severe allergic reaction). CAll 911 immediately if you have to use this medicine, Disp: 1 Device, Rfl: 2   gabapentin (NEURONTIN) 600 MG tablet, Take 1,800 mg by mouth daily., Disp: , Rfl:    lithium 600 MG capsule, Take 600 mg by mouth daily., Disp: , Rfl:    lithium carbonate 300 MG capsule, Take 300 mg by mouth daily., Disp: ,  Rfl:    loperamide (IMODIUM A-D) 2 MG tablet, Take 1 tablet (2 mg total) by mouth 4 (four) times daily as needed for diarrhea or loose stools., Disp: 30 tablet, Rfl: 0   LORazepam (ATIVAN) 0.5 MG tablet, Take 0.5 mg by mouth daily as needed., Disp: , Rfl:    medroxyPROGESTERone (DEPO-PROVERA) 150 MG/ML injection, Inject 1 mL (150 mg total) into the muscle every 3 (three) months., Disp: 1 mL, Rfl: 3   minocycline (MINOCIN) 100 MG capsule,  Take 100 mg by mouth 2 (two) times daily., Disp: , Rfl:    pantoprazole (PROTONIX) 40 MG tablet, Take 1 tablet (40 mg total) by mouth daily., Disp: 90 tablet, Rfl: 1   tirzepatide (MOUNJARO) 7.5 MG/0.5ML Pen, Inject 7.5 mg into the skin once a week., Disp: 6 mL, Rfl: 0   venlafaxine XR (EFFEXOR-XR) 150 MG 24 hr capsule, Take 150 mg by mouth daily., Disp: , Rfl:    venlafaxine XR (EFFEXOR-XR) 75 MG 24 hr capsule, Take by mouth., Disp: , Rfl:    atenolol (TENORMIN) 25 MG tablet, Take 0.5 tablets (12.5 mg total) by mouth 2 (two) times daily., Disp: 90 tablet, Rfl: 1   ondansetron (ZOFRAN) 4 MG tablet, Take 1 tablet (4 mg total) by mouth every 8 (eight) hours as needed for nausea or vomiting., Disp: 30 tablet, Rfl: 0  Observations/Objective: Patient is well-developed, well-nourished in no acute distress.  Resting comfortably  at home.  Head is normocephalic, atraumatic.  No labored breathing.  Speech is clear and coherent with logical content.  Patient is alert and oriented at baseline.    Assessment and Plan: 1. Overweight Improving Increase Mounjaro to 7.5 mg weekly I have asked her to let us know after she completes a few weeks of this dose if she would like to increase or maintain Continue healthy eating Work on incorporating exercise  2. Heartburn Uncontrolled No red flags Possibly related to [facility] Trial protonix 40 mg daily If no improvement of sx, will refer to GI  3. Nausea Uncontrolled No red flags Suspect related to [facility] Provided zofran 4 mg refill If worsens, consider holding Mounjaro  4. POTS (postural orthostatic tachycardia syndrome) Uncontrolled Symptoms potentially worsening with Mounjaro Work on adequate hydration Refill atenolol today Follow-up with cardiology or [place] if worsens  Follow Up Instructions: I discussed the assessment and treatment plan with the patient. The patient was provided an opportunity to ask questions and all were answered. The  patient agreed with the plan and demonstrated an understanding of the instructions.  A copy of instructions were sent to the patient via MyChart unless otherwise noted below.   The patient was advised to call back or seek an in-person evaluation if the symptoms worsen or if the condition fails to improve as anticipated.  [name], [place]

## 2021-09-22 DIAGNOSIS — H33021 Retinal detachment with multiple breaks, right eye: Secondary | ICD-10-CM | POA: Diagnosis not present

## 2021-09-22 DIAGNOSIS — H33321 Round hole, right eye: Secondary | ICD-10-CM | POA: Diagnosis not present

## 2021-09-22 DIAGNOSIS — H31093 Other chorioretinal scars, bilateral: Secondary | ICD-10-CM | POA: Diagnosis not present

## 2021-09-22 DIAGNOSIS — H35412 Lattice degeneration of retina, left eye: Secondary | ICD-10-CM | POA: Diagnosis not present

## 2021-09-26 DIAGNOSIS — H43821 Vitreomacular adhesion, right eye: Secondary | ICD-10-CM | POA: Diagnosis not present

## 2021-09-26 DIAGNOSIS — H35412 Lattice degeneration of retina, left eye: Secondary | ICD-10-CM | POA: Diagnosis not present

## 2021-09-26 DIAGNOSIS — H33021 Retinal detachment with multiple breaks, right eye: Secondary | ICD-10-CM | POA: Diagnosis not present

## 2021-09-26 DIAGNOSIS — H33321 Round hole, right eye: Secondary | ICD-10-CM | POA: Diagnosis not present

## 2021-10-05 DIAGNOSIS — H33021 Retinal detachment with multiple breaks, right eye: Secondary | ICD-10-CM | POA: Diagnosis not present

## 2021-10-05 DIAGNOSIS — H5211 Myopia, right eye: Secondary | ICD-10-CM | POA: Diagnosis not present

## 2021-10-06 DIAGNOSIS — H33021 Retinal detachment with multiple breaks, right eye: Secondary | ICD-10-CM | POA: Diagnosis not present

## 2021-10-06 DIAGNOSIS — H43821 Vitreomacular adhesion, right eye: Secondary | ICD-10-CM | POA: Diagnosis not present

## 2021-10-06 DIAGNOSIS — H33321 Round hole, right eye: Secondary | ICD-10-CM | POA: Diagnosis not present

## 2021-10-13 ENCOUNTER — Encounter: Payer: Self-pay | Admitting: Physician Assistant

## 2021-10-14 ENCOUNTER — Other Ambulatory Visit: Payer: Self-pay | Admitting: Physician Assistant

## 2021-10-14 MED ORDER — TIRZEPATIDE 10 MG/0.5ML ~~LOC~~ SOAJ: 10.0000 mg | SUBCUTANEOUS | 0 refills | Status: DC

## 2021-10-20 ENCOUNTER — Ambulatory Visit (INDEPENDENT_AMBULATORY_CARE_PROVIDER_SITE_OTHER): Payer: No Typology Code available for payment source | Admitting: *Deleted

## 2021-10-20 ENCOUNTER — Other Ambulatory Visit: Payer: Self-pay

## 2021-10-20 ENCOUNTER — Encounter: Payer: Self-pay | Admitting: *Deleted

## 2021-10-20 DIAGNOSIS — Z308 Encounter for other contraceptive management: Secondary | ICD-10-CM | POA: Diagnosis not present

## 2021-10-20 DIAGNOSIS — N912 Amenorrhea, unspecified: Secondary | ICD-10-CM

## 2021-10-20 LAB — POCT URINE PREGNANCY: Preg Test, Ur: NEGATIVE

## 2021-10-20 MED ORDER — MEDROXYPROGESTERONE ACETATE 150 MG/ML IM SUSP
150.0000 mg | Freq: Once | INTRAMUSCULAR | Status: AC
  Administered 2021-10-20: 150 mg via INTRAMUSCULAR

## 2021-10-20 NOTE — Progress Notes (Signed)
Per orders of Dr. Jerline Pain, POCT pregnancy test done due to patient late for injection. Test was Negative.Injection of Depo-Provera 150 mg given IM by Anselmo Pickler, LPN in left deltoid. Patient tolerated injection well. Patient will make appointment for next injection April 27 thru May 11.

## 2021-10-29 DIAGNOSIS — Z20822 Contact with and (suspected) exposure to covid-19: Secondary | ICD-10-CM | POA: Diagnosis not present

## 2021-10-29 DIAGNOSIS — J020 Streptococcal pharyngitis: Secondary | ICD-10-CM | POA: Diagnosis not present

## 2021-10-29 DIAGNOSIS — R1310 Dysphagia, unspecified: Secondary | ICD-10-CM | POA: Diagnosis not present

## 2021-11-10 DIAGNOSIS — H33021 Retinal detachment with multiple breaks, right eye: Secondary | ICD-10-CM | POA: Diagnosis not present

## 2021-11-30 ENCOUNTER — Other Ambulatory Visit: Payer: Self-pay | Admitting: Physician Assistant

## 2021-12-01 MED ORDER — TIRZEPATIDE 10 MG/0.5ML ~~LOC~~ SOAJ: 10.0000 mg | SUBCUTANEOUS | 0 refills | Status: DC

## 2021-12-13 ENCOUNTER — Encounter: Payer: Self-pay | Admitting: Physician Assistant

## 2021-12-19 DIAGNOSIS — H43811 Vitreous degeneration, right eye: Secondary | ICD-10-CM | POA: Diagnosis not present

## 2021-12-19 DIAGNOSIS — H33021 Retinal detachment with multiple breaks, right eye: Secondary | ICD-10-CM | POA: Diagnosis not present

## 2022-01-05 ENCOUNTER — Other Ambulatory Visit: Payer: Self-pay | Admitting: Physician Assistant

## 2022-01-05 MED ORDER — TIRZEPATIDE 10 MG/0.5ML ~~LOC~~ SOAJ: 10.0000 mg | SUBCUTANEOUS | 0 refills | Status: DC

## 2022-01-06 MED ORDER — TIRZEPATIDE 12.5 MG/0.5ML ~~LOC~~ SOAJ: 12.5000 mg | SUBCUTANEOUS | 0 refills | Status: DC

## 2022-01-11 ENCOUNTER — Ambulatory Visit: Payer: No Typology Code available for payment source

## 2022-01-12 ENCOUNTER — Ambulatory Visit (INDEPENDENT_AMBULATORY_CARE_PROVIDER_SITE_OTHER): Payer: Medicare Other

## 2022-01-12 DIAGNOSIS — Z308 Encounter for other contraceptive management: Secondary | ICD-10-CM | POA: Diagnosis not present

## 2022-01-12 DIAGNOSIS — Z3042 Encounter for surveillance of injectable contraceptive: Secondary | ICD-10-CM | POA: Diagnosis not present

## 2022-01-12 MED ORDER — MEDROXYPROGESTERONE ACETATE 150 MG/ML IM SUSP
150.0000 mg | Freq: Once | INTRAMUSCULAR | Status: AC
  Administered 2022-01-12: 150 mg via INTRAMUSCULAR

## 2022-01-12 NOTE — Progress Notes (Signed)
Kathy Zimmerman 31 yr old female presents to office today for 3 month birth control encounter per [name], [place]. Administered MedroxyPROGESTERone Acetate '150mg'$ /mL IM right arm per patient request. Patient tolerated well.  ?

## 2022-01-18 ENCOUNTER — Ambulatory Visit: Payer: No Typology Code available for payment source

## 2022-02-03 MED ORDER — TIRZEPATIDE 12.5 MG/0.5ML ~~LOC~~ SOAJ: 12.5000 mg | SUBCUTANEOUS | 0 refills | Status: DC

## 2022-02-03 NOTE — Addendum Note (Signed)
Addended by: Marian Sorrow on: 02/03/2022 08:32 AM   Modules accepted: Orders

## 2022-02-08 ENCOUNTER — Encounter: Payer: Self-pay | Admitting: Physician Assistant

## 2022-02-08 MED ORDER — ATENOLOL 25 MG PO TABS: 12.5000 mg | ORAL_TABLET | Freq: Two times a day (BID) | ORAL | 1 refills | Status: DC

## 2022-02-24 ENCOUNTER — Encounter: Payer: Self-pay | Admitting: Physician Assistant

## 2022-02-24 ENCOUNTER — Ambulatory Visit (INDEPENDENT_AMBULATORY_CARE_PROVIDER_SITE_OTHER): Payer: PRIVATE HEALTH INSURANCE | Admitting: Physician Assistant

## 2022-02-24 VITALS — BP 100/70 | HR 84 | Temp 98.2°F | Ht 66.5 in | Wt 143.5 lb

## 2022-02-24 DIAGNOSIS — H539 Unspecified visual disturbance: Secondary | ICD-10-CM

## 2022-02-24 DIAGNOSIS — R5383 Other fatigue: Secondary | ICD-10-CM

## 2022-02-24 DIAGNOSIS — G90A Postural orthostatic tachycardia syndrome (POTS): Secondary | ICD-10-CM | POA: Diagnosis not present

## 2022-02-24 DIAGNOSIS — L299 Pruritus, unspecified: Secondary | ICD-10-CM | POA: Diagnosis not present

## 2022-02-24 DIAGNOSIS — R42 Dizziness and giddiness: Secondary | ICD-10-CM

## 2022-02-24 DIAGNOSIS — Z0001 Encounter for general adult medical examination with abnormal findings: Secondary | ICD-10-CM | POA: Diagnosis not present

## 2022-02-24 DIAGNOSIS — Z79899 Other long term (current) drug therapy: Secondary | ICD-10-CM

## 2022-02-24 LAB — TSH: TSH: 3.27 u[IU]/mL (ref 0.35–5.50)

## 2022-02-24 LAB — CBC WITH DIFFERENTIAL/PLATELET
Basophils Absolute: 0 10*3/uL (ref 0.0–0.1)
Basophils Relative: 0.4 % (ref 0.0–3.0)
Eosinophils Absolute: 0.2 10*3/uL (ref 0.0–0.7)
Eosinophils Relative: 2 % (ref 0.0–5.0)
HCT: 37.4 % (ref 36.0–46.0)
Hemoglobin: 12.8 g/dL (ref 12.0–15.0)
Lymphocytes Relative: 24.6 % (ref 12.0–46.0)
Lymphs Abs: 2.1 10*3/uL (ref 0.7–4.0)
MCHC: 34.2 g/dL (ref 30.0–36.0)
MCV: 89 fl (ref 78.0–100.0)
Monocytes Absolute: 0.5 10*3/uL (ref 0.1–1.0)
Monocytes Relative: 5.5 % (ref 3.0–12.0)
Neutro Abs: 5.8 10*3/uL (ref 1.4–7.7)
Neutrophils Relative %: 67.5 % (ref 43.0–77.0)
Platelets: 218 10*3/uL (ref 150.0–400.0)
RBC: 4.2 Mil/uL (ref 3.87–5.11)
RDW: 13.4 % (ref 11.5–15.5)
WBC: 8.5 10*3/uL (ref 4.0–10.5)

## 2022-02-24 LAB — COMPREHENSIVE METABOLIC PANEL
ALT: 13 U/L (ref 0–35)
AST: 15 U/L (ref 0–37)
Albumin: 4.3 g/dL (ref 3.5–5.2)
Alkaline Phosphatase: 62 U/L (ref 39–117)
BUN: 8 mg/dL (ref 6–23)
CO2: 23 mEq/L (ref 19–32)
Calcium: 9.9 mg/dL (ref 8.4–10.5)
Chloride: 107 mEq/L (ref 96–112)
Creatinine, Ser: 0.98 mg/dL (ref 0.40–1.20)
GFR: 77.7 mL/min (ref >60.00)
Glucose, Bld: 82 mg/dL (ref 70–99)
Potassium: 4.3 mEq/L (ref 3.5–5.1)
Sodium: 137 mEq/L (ref 135–145)
Total Bilirubin: 0.4 mg/dL (ref 0.2–1.2)
Total Protein: 7.7 g/dL (ref 6.0–8.3)

## 2022-02-24 LAB — HEMOGLOBIN A1C: Hgb A1c MFr Bld: 4.6 % (ref 4.6–6.5)

## 2022-02-24 LAB — IBC + FERRITIN
Ferritin: 191.3 ng/mL (ref 10.0–291.0)
Iron: 126 ug/dL (ref 42–145)
Saturation Ratios: 40.5 % (ref 20.0–50.0)
TIBC: 310.8 ug/dL (ref 250.0–450.0)
Transferrin: 222 mg/dL (ref 212.0–360.0)

## 2022-02-24 MED ORDER — HYDROXYZINE HCL 50 MG PO TABS: 50.0000 mg | ORAL_TABLET | Freq: Three times a day (TID) | ORAL | 0 refills | Status: DC | PRN

## 2022-02-24 NOTE — Patient Instructions (Addendum)
It was great to see you!  For your itching take 1/2 to 1 tablet of hydroxyzine up to 3 times daily We will also check blood work Please schedule an appointment with your dermatologist I recommend sunscreen!  For your vision darkness Stop mounjaro Hold atenolol Push fluids -- 64 oz daily!!!!! Try to eat small, frequent meals  Follow-up in 1 month  Please go to the lab for blood work.   Our office will call you with your results unless you have chosen to receive results via MyChart.  If your blood work is normal we will follow-up each year for physicals and as scheduled for chronic medical problems.  If anything is abnormal we will treat accordingly and get you in for a follow-up.  Take care,  Aldona Bar

## 2022-02-24 NOTE — Progress Notes (Signed)
Subjective:    Kathy Zimmerman is a 31 y.o. female and is here for a comprehensive physical exam.  HPI  There are no preventive care reminders to display for this patient.  Acute Concerns: Itching -- has been experiencing symptoms for a few months. Whenever she goes into the sun she feels itching all over her body. Has tried 1-3 benadryl, topical benadryl, moisturizer -- all without relief. Denies lesions.  Black spots in her vision; Lightheadedness; Fatigue -- for the past 6 months, she has noticed that when she goes to stand up she has black in her peripheral vision. This occurs pretty much every time she stands up. She admits to dehydration and low calorie intake. She has lost (intentionally) 50+ pounds since October with Mounjaro. Denies: syncope, dizziness. Does get occasional nausea. This also makes her fatigued at times.  Chronic Issues: POTS -- currently taking 12.5 mg atenolol bid for this. Tolerating well but since she has lost so much weight, she is wondering if she still needs this.  Health Maintenance: Immunizations -- UTD PAP -- UTD Bone Density -- n/a Diet -- low calorie Sleep habits -- no concerns Exercise -- minimal Current Weight -- Weight: 143 lb 8 oz (65.1 kg)  Weight History: Wt Readings from Last 10 Encounters:  02/24/22 143 lb 8 oz (65.1 kg)  09/21/21 178 lb (80.7 kg)  07/06/21 200 lb (90.7 kg)  03/31/21 195 lb 5.2 oz (88.6 kg)  12/30/20 195 lb 6.1 oz (88.6 kg)  12/20/20 193 lb 6.1 oz (87.7 kg)  10/27/20 197 lb (89.4 kg)  07/20/20 192 lb 4 oz (87.2 kg)  06/24/20 185 lb 6.4 oz (84.1 kg)  06/21/20 183 lb (83 kg)   Body mass index is 22.81 kg/m. Mood -- overall stable  No LMP recorded. Patient has had an injection.  Birth control -- depo-provera    reports no history of alcohol use.  Tobacco Use: Low Risk  (02/24/2022)   Patient History    Smoking Tobacco Use: Never    Smokeless Tobacco Use: Never    Passive Exposure: Not on file         02/24/2022    8:29 AM  Depression screen PHQ 2/9  Decreased Interest 0  Down, Depressed, Hopeless 0  PHQ - 2 Score 0  Altered sleeping 0  Tired, decreased energy 1  Change in appetite 0  Feeling bad or failure about yourself  0  Trouble concentrating 0  Moving slowly or fidgety/restless 0  Suicidal thoughts 0  PHQ-9 Score 1     Other providers/specialists: Patient Care Team: [name], [place] as PCP - General (Physician Assistant) Josue Hector, MD as PCP - Cardiology (Cardiology)   PMHx, SurgHx, SocialHx, Medications, and Allergies were reviewed in the Visit Navigator and updated as appropriate.   Past Medical History:  Diagnosis Date   Anxiety    Asthma    Depression    Headache    IBS (irritable bowel syndrome)    Panic attack    Preeclampsia      Past Surgical History:  Procedure Laterality Date   CESAREAN SECTION N/A 04/10/2015   Procedure: CESAREAN SECTION;  Surgeon: Crawford Givens, MD;  Location: [facility];  Service: Obstetrics;  Laterality: N/A;   EYE SURGERY     laser   SCLERAL BUCKLE  08/09/2011   Procedure: SCLERAL BUCKLE;  Surgeon: Clent Demark Rankin;  Location: [facility];  Service: Ophthalmology;  Laterality: Left;  with cryo   TENDON  REPAIR     R middle finger     Family History  Problem Relation Age of Onset   Depression Father    Depression Sister    Mental illness Sister    Depression Maternal Grandmother    Mental illness Maternal Grandmother    Thyroid disease Maternal Grandmother    Heart disease Maternal Grandfather    Hypertension Maternal Grandfather    Hyperlipidemia Maternal Grandfather    CAD Maternal Grandfather    Thyroid disease Other    CAD Other    Cancer Neg Hx     Social History   Tobacco Use   Smoking status: Never   Smokeless tobacco: Never  Vaping Use   Vaping Use: Never used  Substance Use Topics   Alcohol use: No   Drug use: No    Review of Systems:   Review of Systems  Constitutional:  Negative  for chills, fever, malaise/fatigue and weight loss.  HENT:  Negative for hearing loss, sinus pain and sore throat.   Respiratory:  Negative for cough and hemoptysis.   Cardiovascular:  Negative for chest pain, palpitations, leg swelling and PND.  Gastrointestinal:  Negative for abdominal pain, constipation, diarrhea, heartburn, nausea and vomiting.  Genitourinary:  Negative for dysuria, frequency and urgency.  Musculoskeletal:  Negative for back pain, myalgias and neck pain.  Skin:  Negative for itching and rash.  Neurological:  Negative for dizziness, tingling, seizures and headaches.  Endo/Heme/Allergies:  Negative for polydipsia.  Psychiatric/Behavioral:  Negative for depression. The patient is not nervous/anxious.     Objective:   BP 100/70 (BP Location: Right Arm, Patient Position: Sitting, Cuff Size: Normal)   Pulse 84   Temp 98.2 F (36.8 C) (Temporal)   Ht 5' 6.5" (1.689 m)   Wt 143 lb 8 oz (65.1 kg)   SpO2 94%   BMI 22.81 kg/m   General Appearance:    Alert, cooperative, no distress, appears stated age  Head:    Normocephalic, without obvious abnormality, atraumatic  Eyes:    PERRL, conjunctiva/corneas clear, EOM's intact, fundi    benign, both eyes  Ears:    Normal TM's and external ear canals, both ears  Nose:   Nares normal, septum midline, mucosa normal, no drainage    or sinus tenderness  Throat:   Lips, mucosa, and tongue normal; teeth and gums normal  Neck:   Supple, symmetrical, trachea midline, no adenopathy;    thyroid:  no enlargement/tenderness/nodules; no carotid   bruit or JVD  Back:     Symmetric, no curvature, ROM normal, no CVA tenderness  Lungs:     Clear to auscultation bilaterally, respirations unlabored  Chest Wall:    No tenderness or deformity   Heart:    Regular rate and rhythm, S1 and S2 normal, no murmur, rub   or gallop  Breast Exam:    Deferred  Abdomen:     Soft, non-tender, bowel sounds active all four quadrants,    no masses, no  organomegaly  Genitalia:    Deferred  Rectal:    Deferred  Extremities:   Extremities normal, atraumatic, no cyanosis or edema  Pulses:   2+ and symmetric all extremities  Skin:   Skin color, texture, turgor normal, no rashes or lesions  Lymph nodes:   Cervical, supraclavicular, and axillary nodes normal  Neurologic:   CNII-XII intact, normal strength, sensation and reflexes    throughout    Assessment/Plan:   Encounter for general adult medical examination with  abnormal findings Today patient counseled on age appropriate routine health concerns for screening and prevention, each reviewed and up to date or declined. Immunizations reviewed and up to date or declined. Labs ordered and reviewed. Risk factors for depression reviewed and negative. Hearing function and visual acuity are intact. ADLs screened and addressed as needed. Functional ability and level of safety reviewed and appropriate. Education, counseling and referrals performed based on assessed risks today. Patient provided with a copy of personalized plan for preventive services.  High risk medication use Update lithium level and provide recommendations accordingly Stable with her psychiatrist  Vision changes; Lightheadedness Suspect due to rapid weight loss and dehydration Stop mounjaro Hold atenolol Push fluids -- 64 oz daily Try to eat small, frequent meals Follow-up in 1 month, sooner if concerns  POTS (postural orthostatic tachycardia syndrome) Suspect dehydration causing worsening sx Push fluids -- 64 oz daily We are going to try to hold atenolol due to the above and weight loss Continue to monitor  Fatigue, unspecified type Update blood work today Suspect due to low calorie diet and dehydration Work on more consistent nutrition/hydration Follow-up in 1 month, sooner if concerns  Pruritus, unspecified Uncontrolled Unclear etiology Will offer hydroxyzine for this 25-50 mg TID prn Recommend sunscreen and  reaching out to dermatologist Update blood work as well  Patient Counseling:   '[x]'$     Nutrition: Stressed importance of moderation in sodium/caffeine intake, saturated fat and cholesterol, caloric balance, sufficient intake of fresh fruits, vegetables, fiber, calcium, iron, and 1 mg of folate supplement per day (for females capable of pregnancy).   '[x]'$      Stressed the importance of regular exercise.    '[x]'$     Substance Abuse: Discussed cessation/primary prevention of tobacco, alcohol, or other drug use; driving or other dangerous activities under the influence; availability of treatment for abuse.    '[x]'$      Injury prevention: Discussed safety belts, safety helmets, smoke detector, smoking near bedding or upholstery.    '[x]'$      Sexuality: Discussed sexually transmitted diseases, partner selection, use of condoms, avoidance of unintended pregnancy  and contraceptive alternatives.    '[x]'$     Dental health: Discussed importance of regular tooth brushing, flossing, and dental visits.   '[x]'$      Health maintenance and immunizations reviewed. Please refer to Health maintenance section.    [name], PA-C [place]

## 2022-02-25 LAB — LITHIUM LEVEL: Lithium Lvl: 1.3 mmol/L — ABNORMAL HIGH (ref 0.6–1.2)

## 2022-03-03 ENCOUNTER — Other Ambulatory Visit: Payer: Self-pay | Admitting: Physician Assistant

## 2022-03-03 ENCOUNTER — Encounter: Payer: Self-pay | Admitting: Physician Assistant

## 2022-03-03 DIAGNOSIS — E785 Hyperlipidemia, unspecified: Secondary | ICD-10-CM

## 2022-03-06 ENCOUNTER — Other Ambulatory Visit: Payer: Self-pay | Admitting: Physician Assistant

## 2022-03-06 DIAGNOSIS — Z79899 Other long term (current) drug therapy: Secondary | ICD-10-CM

## 2022-03-06 NOTE — Telephone Encounter (Signed)
**Note De-identified  [place] Obfuscation** Please advise 

## 2022-03-10 ENCOUNTER — Other Ambulatory Visit (INDEPENDENT_AMBULATORY_CARE_PROVIDER_SITE_OTHER): Payer: PRIVATE HEALTH INSURANCE

## 2022-03-10 DIAGNOSIS — Z79899 Other long term (current) drug therapy: Secondary | ICD-10-CM

## 2022-03-10 DIAGNOSIS — E785 Hyperlipidemia, unspecified: Secondary | ICD-10-CM | POA: Diagnosis not present

## 2022-03-10 LAB — LIPID PANEL
Cholesterol: 206 mg/dL — ABNORMAL HIGH (ref 0–200)
HDL: 39.5 mg/dL (ref >39.00)
LDL Cholesterol: 128 mg/dL — ABNORMAL HIGH (ref 0–99)
NonHDL: 166.86
Total CHOL/HDL Ratio: 5
Triglycerides: 196 mg/dL — ABNORMAL HIGH (ref 0.0–149.0)
VLDL: 39.2 mg/dL (ref 0.0–40.0)

## 2022-03-11 LAB — LITHIUM LEVEL: Lithium Lvl: 0.4 mmol/L — ABNORMAL LOW (ref 0.6–1.2)

## 2022-03-21 DIAGNOSIS — J3081 Allergic rhinitis due to animal (cat) (dog) hair and dander: Secondary | ICD-10-CM | POA: Diagnosis not present

## 2022-03-23 ENCOUNTER — Other Ambulatory Visit: Payer: Self-pay | Admitting: Physician Assistant

## 2022-03-23 ENCOUNTER — Encounter: Payer: Self-pay | Admitting: Physician Assistant

## 2022-03-23 MED ORDER — MEGESTROL ACETATE 40 MG PO TABS: ORAL_TABLET | ORAL | 0 refills | Status: AC

## 2022-03-26 ENCOUNTER — Other Ambulatory Visit: Payer: Self-pay | Admitting: Physician Assistant

## 2022-03-27 ENCOUNTER — Ambulatory Visit: Payer: Medicare Other | Admitting: Physician Assistant

## 2022-03-29 ENCOUNTER — Encounter: Payer: Self-pay | Admitting: Physician Assistant

## 2022-03-29 ENCOUNTER — Ambulatory Visit (INDEPENDENT_AMBULATORY_CARE_PROVIDER_SITE_OTHER): Payer: PRIVATE HEALTH INSURANCE | Admitting: Physician Assistant

## 2022-03-29 VITALS — BP 110/80 | HR 88 | Temp 98.3°F | Ht 66.5 in | Wt 145.2 lb

## 2022-03-29 DIAGNOSIS — R42 Dizziness and giddiness: Secondary | ICD-10-CM | POA: Diagnosis not present

## 2022-03-29 DIAGNOSIS — Z79899 Other long term (current) drug therapy: Secondary | ICD-10-CM | POA: Diagnosis not present

## 2022-03-29 NOTE — Progress Notes (Signed)
Kathy Zimmerman is a 31 y.o. female here for a follow up of a pre-existing problem.  History of Present Illness:   Chief Complaint  Patient presents with   Dizziness    Pt states has resolved.   Weight Check    Pt is up 2.25 lbs    HPI  Lightheadedness  Patient here to follow-up. She was feeling lightheadedness in the previous visit on 02/24/2022. At that time, she was having black in her peripheral vision after standing up. Has had intentionally lost about 50 pounds or more with Mounjaro. Her symptoms was suspected to be due to rapid weight loss and she was stopped on Mounjaro. Today, she states her lightheadedness has resolved and she has been feeling well.   Patient has been had lost about 50 pounds and has been managing her weight well. She has gained about 2 pounds since previous visit. She denies any other concerning sx.   Lithium level She needs her lithium level rechecked. Its been abnormal the last two draws and her psychiatrist is adjusting her medications. Currently on lithium 600 mg daily.  Past Medical History:  Diagnosis Date   Anxiety    Asthma    Depression    Headache    IBS (irritable bowel syndrome)    Panic attack    Preeclampsia      Social History   Tobacco Use   Smoking status: Never   Smokeless tobacco: Never  Vaping Use   Vaping Use: Never used  Substance Use Topics   Alcohol use: No   Drug use: No    Past Surgical History:  Procedure Laterality Date   CESAREAN SECTION N/A 04/10/2015   Procedure: CESAREAN SECTION;  Surgeon: Crawford Givens, MD;  Location: [facility];  Service: Obstetrics;  Laterality: N/A;   EYE SURGERY     laser   SCLERAL BUCKLE  08/09/2011   Procedure: SCLERAL BUCKLE;  Surgeon: Clent Demark Rankin;  Location: [facility];  Service: Ophthalmology;  Laterality: Left;  with cryo   TENDON REPAIR     R middle finger    Family History  Problem Relation Age of Onset   Depression Father    Depression Sister    Mental illness Sister     Depression Maternal Grandmother    Mental illness Maternal Grandmother    Thyroid disease Maternal Grandmother    Heart disease Maternal Grandfather    Hypertension Maternal Grandfather    Hyperlipidemia Maternal Grandfather    CAD Maternal Grandfather    Thyroid disease Other    CAD Other    Cancer Neg Hx     Allergies  Allergen Reactions   Cat Hair Extract Anaphylaxis   Shrimp [Shellfish Allergy] Anaphylaxis and Swelling   Sulfasalazine Rash   Sulfa Antibiotics Rash    Current Medications:   Current Outpatient Medications:    budesonide (PULMICORT) 1 MG/2ML nebulizer solution, Take 2 mLs (1 mg total) by nebulization in the morning and at bedtime., Disp: 60 mL, Rfl: 0   busPIRone (BUSPAR) 10 MG tablet, Take 10 mg by mouth in the morning and at bedtime., Disp: , Rfl:    chlorproMAZINE (THORAZINE) 100 MG tablet, Take 100 mg by mouth at bedtime., Disp: , Rfl:    EPINEPHrine (EPIPEN 2-PAK) 0.3 mg/0.3 mL IJ SOAJ injection, Inject 0.3 mLs (0.3 mg total) into the muscle once as needed (for severe allergic reaction). CAll 911 immediately if you have to use this medicine, Disp: 1 Device, Rfl: 2   gabapentin (  NEURONTIN) 600 MG tablet, Take 1,800 mg by mouth daily., Disp: , Rfl:    hydrOXYzine (ATARAX) 50 MG tablet, Take 1 tablet (50 mg total) by mouth 3 (three) times daily as needed for itching., Disp: 30 tablet, Rfl: 0   lithium 600 MG capsule, Take 600 mg by mouth daily., Disp: , Rfl:    lithium carbonate 300 MG capsule, Take 300 mg by mouth daily., Disp: , Rfl:    loperamide (IMODIUM A-D) 2 MG tablet, Take 1 tablet (2 mg total) by mouth 4 (four) times daily as needed for diarrhea or loose stools., Disp: 30 tablet, Rfl: 0   LORazepam (ATIVAN) 0.5 MG tablet, Take 0.5 mg by mouth daily as needed., Disp: , Rfl:    medroxyPROGESTERone (DEPO-PROVERA) 150 MG/ML injection, Inject 1 mL (150 mg total) into the muscle every 3 (three) months., Disp: 1 mL, Rfl: 3   megestrol (MEGACE) 40 MG tablet,  TAKE 3 TABS X 5 DAYS, 2 TABS X 5 DAYS, 1 TAB DAILY TO CONTROL VAG BLEEDING, STOP WHEN BLEEDING STOPS,, Disp: 45 tablet, Rfl: 0   ondansetron (ZOFRAN) 4 MG tablet, Take 1 tablet (4 mg total) by mouth every 8 (eight) hours as needed for nausea or vomiting., Disp: 30 tablet, Rfl: 0   venlafaxine XR (EFFEXOR-XR) 150 MG 24 hr capsule, Take 150 mg by mouth daily., Disp: , Rfl:    venlafaxine XR (EFFEXOR-XR) 75 MG 24 hr capsule, Take by mouth., Disp: , Rfl:    Review of Systems:   ROS Negative unless otherwise specified per HPI.   Vitals:   Vitals:   03/29/22 1016  BP: 110/80  Pulse: 88  Temp: 98.3 F (36.8 C)  TempSrc: Temporal  SpO2: 97%  Weight: 145 lb 4 oz (65.9 kg)  Height: 5' 6.5" (1.689 m)     Body mass index is 23.09 kg/m.  Physical Exam:   Physical Exam Vitals and nursing note reviewed.  Constitutional:      General: She is not in acute distress.    Appearance: She is well-developed. She is not ill-appearing or toxic-appearing.  Cardiovascular:     Rate and Rhythm: Normal rate and regular rhythm.     Pulses: Normal pulses.     Heart sounds: Normal heart sounds, S1 normal and S2 normal.  Pulmonary:     Effort: Pulmonary effort is normal.     Breath sounds: Normal breath sounds.  Skin:    General: Skin is warm and dry.  Neurological:     Mental Status: She is alert.     GCS: GCS eye subscore is 4. GCS verbal subscore is 5. GCS motor subscore is 6.  Psychiatric:        Speech: Speech normal.        Behavior: Behavior normal. Behavior is cooperative.     Assessment and Plan:   High risk medication use Update lithium level today -- defer adjustments to her psychiatrist  Lightheadedness Resolved Weight has stabilized Remain off of atenolol Follow-up prn if new/worsening/return  I,Savera Zaman,acting as a scribe for [employer], PA.,have documented all relevant documentation on the behalf of [name], PA,as directed by  [name], PA while in  the presence of [name], [place].   I, [name], [place], have reviewed all documentation for this visit. The documentation on 03/29/22 for the exam, diagnosis, procedures, and orders are all accurate and complete.   [name], PA-C

## 2022-03-30 ENCOUNTER — Other Ambulatory Visit: Payer: Self-pay | Admitting: Physician Assistant

## 2022-03-30 LAB — LITHIUM LEVEL: Lithium Lvl: 1.2 mmol/L (ref 0.6–1.2)

## 2022-03-30 NOTE — Telephone Encounter (Signed)
OK to refill or was medication d/c yesterday?

## 2022-04-05 ENCOUNTER — Ambulatory Visit: Payer: Medicare Other

## 2022-04-05 ENCOUNTER — Telehealth: Payer: Self-pay | Admitting: Physician Assistant

## 2022-04-05 NOTE — Telephone Encounter (Signed)
x

## 2022-04-06 ENCOUNTER — Ambulatory Visit: Payer: Medicaid Other

## 2022-04-06 ENCOUNTER — Telehealth: Payer: Self-pay | Admitting: *Deleted

## 2022-04-06 MED ORDER — MEDROXYPROGESTERONE ACETATE 150 MG/ML IM SUSP: 150.0000 mg | INTRAMUSCULAR | 0 refills | Status: AC

## 2022-04-06 NOTE — Telephone Encounter (Signed)
Left message Rx was sent to pharmacy. Any questions please call office.

## 2022-04-06 NOTE — Addendum Note (Signed)
Addended by: Marian Sorrow on: 04/06/2022 11:44 AM   Modules accepted: Orders

## 2022-04-06 NOTE — Telephone Encounter (Signed)
Patient came into the office to get her depo shot, office is out of depo provera, not sure when it will come in. Patient would like Rx sent to the pharmacy so that she can come back today to have it done.

## 2022-04-07 ENCOUNTER — Ambulatory Visit (INDEPENDENT_AMBULATORY_CARE_PROVIDER_SITE_OTHER): Payer: PRIVATE HEALTH INSURANCE

## 2022-04-07 DIAGNOSIS — Z308 Encounter for other contraceptive management: Secondary | ICD-10-CM

## 2022-04-07 DIAGNOSIS — Z3042 Encounter for surveillance of injectable contraceptive: Secondary | ICD-10-CM

## 2022-04-07 MED ORDER — MEDROXYPROGESTERONE ACETATE 150 MG/ML IM SUSP
150.0000 mg | Freq: Once | INTRAMUSCULAR | Status: AC
  Administered 2022-04-07: 150 mg via INTRAMUSCULAR

## 2022-04-07 NOTE — Progress Notes (Signed)
Pt came in for Depo injection, pt supplied her own medication; administered in left deltoid and tolerated well.

## 2022-04-20 ENCOUNTER — Encounter: Payer: Self-pay | Admitting: Physician Assistant

## 2022-04-20 ENCOUNTER — Other Ambulatory Visit: Payer: Self-pay | Admitting: Physician Assistant

## 2022-04-20 MED ORDER — TIRZEPATIDE 5 MG/0.5ML ~~LOC~~ SOAJ: 5.0000 mg | SUBCUTANEOUS | 1 refills | Status: DC

## 2022-06-01 DIAGNOSIS — H59813 Chorioretinal scars after surgery for detachment, bilateral: Secondary | ICD-10-CM | POA: Diagnosis not present

## 2022-06-01 DIAGNOSIS — H43813 Vitreous degeneration, bilateral: Secondary | ICD-10-CM | POA: Diagnosis not present

## 2022-06-01 DIAGNOSIS — H43391 Other vitreous opacities, right eye: Secondary | ICD-10-CM | POA: Diagnosis not present

## 2022-06-05 ENCOUNTER — Encounter: Payer: Self-pay | Admitting: *Deleted

## 2022-06-05 DIAGNOSIS — H43391 Other vitreous opacities, right eye: Secondary | ICD-10-CM | POA: Diagnosis not present

## 2022-06-05 DIAGNOSIS — H4311 Vitreous hemorrhage, right eye: Secondary | ICD-10-CM | POA: Diagnosis not present

## 2022-06-05 DIAGNOSIS — H4422 Degenerative myopia, left eye: Secondary | ICD-10-CM | POA: Diagnosis not present

## 2022-06-05 DIAGNOSIS — H43813 Vitreous degeneration, bilateral: Secondary | ICD-10-CM | POA: Diagnosis not present

## 2022-06-08 ENCOUNTER — Encounter: Payer: Self-pay | Admitting: Physician Assistant

## 2022-06-08 ENCOUNTER — Telehealth: Payer: Self-pay | Admitting: Physician Assistant

## 2022-06-08 NOTE — Telephone Encounter (Signed)
Copied from [place] [id number]. Topic: Medicare AWV >> Jun 08, 2022 10:33 AM Devoria Glassing wrote: Reason for CRM: Left message for patient to schedule Annual Wellness Visit.  Please schedule with Nurse Health Advisor Charlott Rakes, RN at [facility]. This appt can be telephone or office visit. Please call [phone] ask for [facility]

## 2022-06-09 ENCOUNTER — Encounter: Payer: Self-pay | Admitting: Family

## 2022-06-09 ENCOUNTER — Ambulatory Visit (INDEPENDENT_AMBULATORY_CARE_PROVIDER_SITE_OTHER): Payer: Medicare Other | Admitting: Family

## 2022-06-09 VITALS — BP 96/68 | HR 114 | Temp 97.8°F | Ht 66.5 in | Wt 143.0 lb

## 2022-06-09 DIAGNOSIS — F3162 Bipolar disorder, current episode mixed, moderate: Secondary | ICD-10-CM | POA: Diagnosis not present

## 2022-06-09 DIAGNOSIS — F4312 Post-traumatic stress disorder, chronic: Secondary | ICD-10-CM | POA: Diagnosis not present

## 2022-06-09 DIAGNOSIS — F603 Borderline personality disorder: Secondary | ICD-10-CM | POA: Diagnosis not present

## 2022-06-09 DIAGNOSIS — J029 Acute pharyngitis, unspecified: Secondary | ICD-10-CM

## 2022-06-09 LAB — POC COVID19 BINAXNOW: SARS Coronavirus 2 Ag: NEGATIVE

## 2022-06-09 LAB — POCT RAPID STREP A (OFFICE): Rapid Strep A Screen: NEGATIVE

## 2022-06-09 NOTE — Progress Notes (Signed)
Patient ID: Kathy Zimmerman, female    DOB: 1990/11/03, 31 y.o.   MRN: [id number]  Chief Complaint  Patient presents with   Sore Throat    Pt c/o sore throat for about 2 days. Has tried tyleol when did not help. Son has strep.     HPI:      Sore Throat: Patient complains of sore throat. Associated symptoms include sinus and nasal congestion and sore throat.Onset of symptoms was 2 days ago, gradually worsening since that time. She is drinking moderate amounts of fluids. She has had recent close exposure to someone with proven streptococcal pharyngitis.   Assessment & Plan:  1. Sore throat rapid strep and covid neg. Advised pt on extra strength tylenol q4h, warm salt water gargles & spit, throat analgesic sprays or lozenges prn, drink plenty of fluids.  - POCT rapid strep A - POC COVID-19   Subjective:    Outpatient Medications Prior to Visit  Medication Sig Dispense Refill   budesonide (PULMICORT) 1 MG/2ML nebulizer solution Take 2 mLs (1 mg total) by nebulization in the morning and at bedtime. 60 mL 0   busPIRone (BUSPAR) 10 MG tablet Take 10 mg by mouth in the morning and at bedtime.     chlorproMAZINE (THORAZINE) 100 MG tablet Take 100 mg by mouth at bedtime.     EPINEPHrine (EPIPEN 2-PAK) 0.3 mg/0.3 mL IJ SOAJ injection Inject 0.3 mLs (0.3 mg total) into the muscle once as needed (for severe allergic reaction). CAll 911 immediately if you have to use this medicine 1 Device 2   gabapentin (NEURONTIN) 600 MG tablet Take 1,800 mg by mouth daily.     hydrOXYzine (ATARAX) 50 MG tablet Take 1 tablet (50 mg total) by mouth 3 (three) times daily as needed for itching. 30 tablet 0   lithium 600 MG capsule Take 600 mg by mouth daily.     lithium carbonate 300 MG capsule Take 300 mg by mouth daily.     loperamide (IMODIUM A-D) 2 MG tablet Take 1 tablet (2 mg total) by mouth 4 (four) times daily as needed for diarrhea or loose stools. 30 tablet 0   LORazepam (ATIVAN) 0.5 MG tablet Take  0.5 mg by mouth daily as needed.     medroxyPROGESTERone (DEPO-PROVERA) 150 MG/ML injection Inject 1 mL (150 mg total) into the muscle every 3 (three) months. 1 mL 0   megestrol (MEGACE) 40 MG tablet TAKE 3 TABS X 5 DAYS, 2 TABS X 5 DAYS, 1 TAB DAILY TO CONTROL VAG BLEEDING, STOP WHEN BLEEDING STOPS, 45 tablet 0   ondansetron (ZOFRAN) 4 MG tablet Take 1 tablet (4 mg total) by mouth every 8 (eight) hours as needed for nausea or vomiting. 30 tablet 0   tirzepatide (MOUNJARO) 5 MG/0.5ML Pen Inject 5 mg into the skin once a week. 6 mL 1   venlafaxine XR (EFFEXOR-XR) 150 MG 24 hr capsule Take 150 mg by mouth daily.     venlafaxine XR (EFFEXOR-XR) 75 MG 24 hr capsule Take by mouth.     No facility-administered medications prior to visit.   Past Medical History:  Diagnosis Date   Anxiety    Asthma    Depression    Headache    IBS (irritable bowel syndrome)    Panic attack    Preeclampsia    Past Surgical History:  Procedure Laterality Date   CESAREAN SECTION N/A 04/10/2015   Procedure: CESAREAN SECTION;  Surgeon: Crawford Givens, MD;  Location: [facility];  Service: Obstetrics;  Laterality: N/A;   EYE SURGERY     laser   SCLERAL BUCKLE  08/09/2011   Procedure: SCLERAL BUCKLE;  Surgeon: Clent Demark Rankin;  Location: [facility];  Service: Ophthalmology;  Laterality: Left;  with cryo   TENDON REPAIR     R middle finger   Allergies  Allergen Reactions   Cat Hair Extract Anaphylaxis   Shrimp [Shellfish Allergy] Anaphylaxis and Swelling   Sulfasalazine Rash   Sulfa Antibiotics Rash      Objective:    Physical Exam Vitals and nursing note reviewed.  Constitutional:      Appearance: Normal appearance.  HENT:     Right Ear: Tympanic membrane and ear canal normal.     Left Ear: Tympanic membrane and ear canal normal.     Nose:     Right Sinus: No maxillary sinus tenderness or frontal sinus tenderness.     Left Sinus: No maxillary sinus tenderness or frontal sinus tenderness.     Mouth/Throat:      Mouth: Mucous membranes are moist.     Pharynx: Posterior oropharyngeal erythema present. No pharyngeal swelling, oropharyngeal exudate or uvula swelling.     Tonsils: No tonsillar exudate or tonsillar abscesses.  Cardiovascular:     Rate and Rhythm: Normal rate and regular rhythm.  Pulmonary:     Effort: Pulmonary effort is normal.     Breath sounds: Normal breath sounds.  Musculoskeletal:        General: Normal range of motion.  Skin:    General: Skin is warm and dry.  Neurological:     Mental Status: She is alert.  Psychiatric:        Mood and Affect: Mood normal.        Behavior: Behavior normal.    BP 96/68 (BP Location: Left Arm, Patient Position: Sitting, Cuff Size: Large)   Pulse (!) 114   Temp 97.8 F (36.6 C) (Temporal)   Ht 5' 6.5" (1.689 m)   Wt 143 lb (64.9 kg)   LMP  (LMP Unknown)   SpO2 100%   BMI 22.74 kg/m  Wt Readings from Last 3 Encounters:  06/09/22 143 lb (64.9 kg)  03/29/22 145 lb 4 oz (65.9 kg)  02/24/22 143 lb 8 oz (65.1 kg)       Jeanie Sewer, NP

## 2022-06-12 ENCOUNTER — Ambulatory Visit (INDEPENDENT_AMBULATORY_CARE_PROVIDER_SITE_OTHER): Payer: PRIVATE HEALTH INSURANCE | Admitting: Physician Assistant

## 2022-06-12 ENCOUNTER — Encounter: Payer: Self-pay | Admitting: Physician Assistant

## 2022-06-12 VITALS — BP 100/70 | HR 108 | Temp 98.0°F | Ht 66.5 in | Wt 142.0 lb

## 2022-06-12 DIAGNOSIS — J029 Acute pharyngitis, unspecified: Secondary | ICD-10-CM | POA: Diagnosis not present

## 2022-06-12 LAB — POC INFLUENZA A&B (BINAX/QUICKVUE)
Influenza A, POC: NEGATIVE
Influenza B, POC: NEGATIVE

## 2022-06-12 LAB — POCT RAPID STREP A (OFFICE): Rapid Strep A Screen: NEGATIVE

## 2022-06-12 MED ORDER — AMOXICILLIN-POT CLAVULANATE 875-125 MG PO TABS: 1.0000 | ORAL_TABLET | Freq: Two times a day (BID) | ORAL | 0 refills | Status: DC

## 2022-06-12 NOTE — Progress Notes (Signed)
Kathy Zimmerman is a 31 y.o. female here for a follow up of a pre-existing problem.  History of Present Illness:   Chief Complaint  Patient presents with   Sore Throat    Pt c/o sore throat x 1 week, not improving   Nasal Congestion    Pt has nasal congestion, facial pain and headache off and on.    Sore Throat     Sore throat Symptoms for about a week now. Sister and son both treated for strep. She has had worsening ST and facial pain, congestion. She also had nausea, vomiting and diarrhea x 1 day on Saturday - this has resolved. Takes tylenol w/o relief of sx. Tried flonase (?) -- a few times. Denies fevers, chills, n/v/d.   Past Medical History:  Diagnosis Date   Anxiety    Asthma    Depression    Headache    IBS (irritable bowel syndrome)    Panic attack    Preeclampsia      Social History   Tobacco Use   Smoking status: Never   Smokeless tobacco: Never  Vaping Use   Vaping Use: Never used  Substance Use Topics   Alcohol use: No   Drug use: No    Past Surgical History:  Procedure Laterality Date   CESAREAN SECTION N/A 04/10/2015   Procedure: CESAREAN SECTION;  Surgeon: Crawford Givens, MD;  Location: [facility];  Service: Obstetrics;  Laterality: N/A;   EYE SURGERY     laser   SCLERAL BUCKLE  08/09/2011   Procedure: SCLERAL BUCKLE;  Surgeon: Clent Demark Rankin;  Location: [facility];  Service: Ophthalmology;  Laterality: Left;  with cryo   TENDON REPAIR     R middle finger    Family History  Problem Relation Age of Onset   Depression Father    Depression Sister    Mental illness Sister    Depression Maternal Grandmother    Mental illness Maternal Grandmother    Thyroid disease Maternal Grandmother    Heart disease Maternal Grandfather    Hypertension Maternal Grandfather    Hyperlipidemia Maternal Grandfather    CAD Maternal Grandfather    Thyroid disease Other    CAD Other    Cancer Neg Hx     Allergies  Allergen Reactions   Cat Hair Extract Anaphylaxis    Shrimp [Shellfish Allergy] Anaphylaxis and Swelling   Sulfasalazine Rash   Sulfa Antibiotics Rash    Current Medications:   Current Outpatient Medications:    budesonide (PULMICORT) 1 MG/2ML nebulizer solution, Take 2 mLs (1 mg total) by nebulization in the morning and at bedtime., Disp: 60 mL, Rfl: 0   busPIRone (BUSPAR) 10 MG tablet, Take 10 mg by mouth in the morning and at bedtime., Disp: , Rfl:    chlorproMAZINE (THORAZINE) 100 MG tablet, Take 100 mg by mouth at bedtime., Disp: , Rfl:    EPINEPHrine (EPIPEN 2-PAK) 0.3 mg/0.3 mL IJ SOAJ injection, Inject 0.3 mLs (0.3 mg total) into the muscle once as needed (for severe allergic reaction). CAll 911 immediately if you have to use this medicine, Disp: 1 Device, Rfl: 2   gabapentin (NEURONTIN) 600 MG tablet, Take 1,800 mg by mouth daily., Disp: , Rfl:    hydrOXYzine (ATARAX) 50 MG tablet, Take 1 tablet (50 mg total) by mouth 3 (three) times daily as needed for itching., Disp: 30 tablet, Rfl: 0   lithium 600 MG capsule, Take 600 mg by mouth daily., Disp: , Rfl:  lithium carbonate 300 MG capsule, Take 300 mg by mouth daily., Disp: , Rfl:    loperamide (IMODIUM A-D) 2 MG tablet, Take 1 tablet (2 mg total) by mouth 4 (four) times daily as needed for diarrhea or loose stools., Disp: 30 tablet, Rfl: 0   LORazepam (ATIVAN) 0.5 MG tablet, Take 0.5 mg by mouth daily as needed., Disp: , Rfl:    medroxyPROGESTERone (DEPO-PROVERA) 150 MG/ML injection, Inject 1 mL (150 mg total) into the muscle every 3 (three) months., Disp: 1 mL, Rfl: 0   megestrol (MEGACE) 40 MG tablet, TAKE 3 TABS X 5 DAYS, 2 TABS X 5 DAYS, 1 TAB DAILY TO CONTROL VAG BLEEDING, STOP WHEN BLEEDING STOPS,, Disp: 45 tablet, Rfl: 0   ondansetron (ZOFRAN) 4 MG tablet, Take 1 tablet (4 mg total) by mouth every 8 (eight) hours as needed for nausea or vomiting., Disp: 30 tablet, Rfl: 0   tirzepatide (MOUNJARO) 5 MG/0.5ML Pen, Inject 5 mg into the skin once a week., Disp: 6 mL, Rfl: 1    venlafaxine XR (EFFEXOR-XR) 150 MG 24 hr capsule, Take 150 mg by mouth daily., Disp: , Rfl:    venlafaxine XR (EFFEXOR-XR) 75 MG 24 hr capsule, Take by mouth., Disp: , Rfl:    Review of Systems:   ROS Negative unless otherwise specified per HPI.   Vitals:   Vitals:   06/12/22 1414  BP: 100/70  Pulse: (!) 108  Temp: 98 F (36.7 C)  TempSrc: Temporal  SpO2: 98%  Weight: 142 lb (64.4 kg)  Height: 5' 6.5" (1.689 m)     Body mass index is 22.58 kg/m.  Physical Exam:   Physical Exam Vitals and nursing note reviewed.  Constitutional:      General: She is not in acute distress.    Appearance: She is well-developed. She is not ill-appearing or toxic-appearing.  HENT:     Head: Normocephalic and atraumatic.     Right Ear: Tympanic membrane, ear canal and external ear normal. Tympanic membrane is not erythematous, retracted or bulging.     Left Ear: Tympanic membrane, ear canal and external ear normal. Tympanic membrane is not erythematous, retracted or bulging.     Nose: Nose normal.     Right Sinus: No maxillary sinus tenderness or frontal sinus tenderness.     Left Sinus: No maxillary sinus tenderness or frontal sinus tenderness.     Mouth/Throat:     Pharynx: Uvula midline. Posterior oropharyngeal erythema present.     Tonsils: Tonsillar exudate present. 3+ on the right. 3+ on the left.  Eyes:     General: Lids are normal.     Conjunctiva/sclera: Conjunctivae normal.  Neck:     Trachea: Trachea normal.  Cardiovascular:     Rate and Rhythm: Normal rate and regular rhythm.     Heart sounds: Normal heart sounds, S1 normal and S2 normal.  Pulmonary:     Effort: Pulmonary effort is normal.     Breath sounds: Normal breath sounds. No decreased breath sounds, wheezing, rhonchi or rales.  Lymphadenopathy:     Cervical: No cervical adenopathy.  Skin:    General: Skin is warm and dry.  Neurological:     Mental Status: She is alert.  Psychiatric:        Speech: Speech  normal.        Behavior: Behavior normal. Behavior is cooperative.    Results for orders placed or performed in visit on 06/12/22  POCT rapid strep A  Result Value  Ref Range   Rapid Strep A Screen Negative Negative  POC Influenza A&B(BINAX/QUICKVUE)  Result Value Ref Range   Influenza A, POC Negative Negative   Influenza B, POC Negative Negative    Assessment and Plan:   Sore throat Strep and Flu negative Given sx and appearance of tonsils, will treat for tonsillitis Follow-up as needed, worsening precautions advised   [name], PA-C

## 2022-06-12 NOTE — Patient Instructions (Signed)
It was great to see you!  Start antitiobic   Take care,  [name] PA-C

## 2022-06-27 ENCOUNTER — Ambulatory Visit: Payer: Medicare Other

## 2022-06-29 ENCOUNTER — Encounter: Payer: Self-pay | Admitting: Physician Assistant

## 2022-06-29 ENCOUNTER — Ambulatory Visit (INDEPENDENT_AMBULATORY_CARE_PROVIDER_SITE_OTHER): Payer: Medicare Other | Admitting: Physician Assistant

## 2022-06-29 VITALS — BP 90/60 | HR 95 | Temp 97.7°F | Ht 66.5 in | Wt 143.5 lb

## 2022-06-29 DIAGNOSIS — R519 Headache, unspecified: Secondary | ICD-10-CM | POA: Diagnosis not present

## 2022-06-29 DIAGNOSIS — M542 Cervicalgia: Secondary | ICD-10-CM

## 2022-06-29 DIAGNOSIS — Z308 Encounter for other contraceptive management: Secondary | ICD-10-CM

## 2022-06-29 MED ORDER — MEDROXYPROGESTERONE ACETATE 150 MG/ML IM SUSP
150.0000 mg | Freq: Once | INTRAMUSCULAR | Status: AC
  Administered 2022-06-29: 150 mg via INTRAMUSCULAR

## 2022-06-29 NOTE — Progress Notes (Signed)
Kathy Zimmerman is a 31 y.o. female here for a follow up of a pre-existing problem.  History of Present Illness:   Chief Complaint  Patient presents with   Migraines    Pt c/o headaches for the past two weeks, almost everyday. Headache at base of skull. She has been taking 3 extra strength Tylenol. Denies sensitivity to light, but is having nausea.    HPI  Headaches: She complains of consistent headaches because of family stress and insomnia. She reports more left sided neck painthan right. She complains of neck pain when she looks down. She is taking gabapentin 1800 mg, tylenol, and about 3 benadryl 25 mg. She denies vision changes, slurred speech, or any other symptoms. She denies any muscle relaxer. She cannot tolerate ibuprofen.   She is not getting consistent sleep because of headaches. She wakes up more frequently during night than before. She notes that she is taking more naps than usual, around 1-2 hrs.   Contraceptive: She is receiving her Depo vaccine during today's visit.   Past Medical History:  Diagnosis Date   Anxiety    Asthma    Depression    Headache    IBS (irritable bowel syndrome)    Panic attack    Preeclampsia      Social History   Tobacco Use   Smoking status: Never   Smokeless tobacco: Never  Vaping Use   Vaping Use: Never used  Substance Use Topics   Alcohol use: No   Drug use: No    Past Surgical History:  Procedure Laterality Date   CESAREAN SECTION N/A 04/10/2015   Procedure: CESAREAN SECTION;  Surgeon: Crawford Givens, MD;  Location: [facility];  Service: Obstetrics;  Laterality: N/A;   EYE SURGERY     laser   SCLERAL BUCKLE  08/09/2011   Procedure: SCLERAL BUCKLE;  Surgeon: Clent Demark Rankin;  Location: [facility];  Service: Ophthalmology;  Laterality: Left;  with cryo   TENDON REPAIR     R middle finger    Family History  Problem Relation Age of Onset   Depression Father    Depression Sister    Mental illness Sister    Depression Maternal  Grandmother    Mental illness Maternal Grandmother    Thyroid disease Maternal Grandmother    Heart disease Maternal Grandfather    Hypertension Maternal Grandfather    Hyperlipidemia Maternal Grandfather    CAD Maternal Grandfather    Thyroid disease Other    CAD Other    Cancer Neg Hx     Allergies  Allergen Reactions   Cat Hair Extract Anaphylaxis   Shrimp [Shellfish Allergy] Anaphylaxis and Swelling   Sulfasalazine Rash   Sulfa Antibiotics Rash    Current Medications:   Current Outpatient Medications:    budesonide (PULMICORT) 1 MG/2ML nebulizer solution, Take 2 mLs (1 mg total) by nebulization in the morning and at bedtime., Disp: 60 mL, Rfl: 0   busPIRone (BUSPAR) 10 MG tablet, Take 10 mg by mouth in the morning and at bedtime., Disp: , Rfl:    chlorproMAZINE (THORAZINE) 100 MG tablet, Take 100 mg by mouth at bedtime., Disp: , Rfl:    diphenhydrAMINE (BENADRYL) 50 MG tablet, Take 50 mg by mouth at bedtime as needed., Disp: , Rfl:    EPINEPHrine (EPIPEN 2-PAK) 0.3 mg/0.3 mL IJ SOAJ injection, Inject 0.3 mLs (0.3 mg total) into the muscle once as needed (for severe allergic reaction). CAll 911 immediately if you have to use this  medicine, Disp: 1 Device, Rfl: 2   gabapentin (NEURONTIN) 600 MG tablet, Take 1,800 mg by mouth daily., Disp: , Rfl:    lithium 600 MG capsule, Take 600 mg by mouth daily., Disp: , Rfl:    lithium carbonate 300 MG capsule, Take 300 mg by mouth daily., Disp: , Rfl:    loperamide (IMODIUM A-D) 2 MG tablet, Take 1 tablet (2 mg total) by mouth 4 (four) times daily as needed for diarrhea or loose stools., Disp: 30 tablet, Rfl: 0   medroxyPROGESTERone (DEPO-PROVERA) 150 MG/ML injection, Inject 1 mL (150 mg total) into the muscle every 3 (three) months., Disp: 1 mL, Rfl: 0   megestrol (MEGACE) 40 MG tablet, TAKE 3 TABS X 5 DAYS, 2 TABS X 5 DAYS, 1 TAB DAILY TO CONTROL VAG BLEEDING, STOP WHEN BLEEDING STOPS,, Disp: 45 tablet, Rfl: 0   ondansetron (ZOFRAN) 4 MG  tablet, Take 1 tablet (4 mg total) by mouth every 8 (eight) hours as needed for nausea or vomiting., Disp: 30 tablet, Rfl: 0   tirzepatide (MOUNJARO) 5 MG/0.5ML Pen, Inject 5 mg into the skin once a week., Disp: 6 mL, Rfl: 1   venlafaxine XR (EFFEXOR-XR) 150 MG 24 hr capsule, Take 150 mg by mouth daily., Disp: , Rfl:    venlafaxine XR (EFFEXOR-XR) 75 MG 24 hr capsule, Take by mouth., Disp: , Rfl:    Review of Systems:   Review of Systems  Neurological:  Positive for headaches (More on the left side).    Vitals:   Vitals:   06/29/22 0900  BP: 90/60  Pulse: 95  Temp: 97.7 F (36.5 C)  TempSrc: Temporal  SpO2: 95%  Weight: 143 lb 8 oz (65.1 kg)  Height: 5' 6.5" (1.689 m)     Body mass index is 22.81 kg/m.  Physical Exam:   Physical Exam Constitutional:      Appearance: Normal appearance. She is well-developed.  HENT:     Head: Normocephalic and atraumatic.  Eyes:     General: Lids are normal.     Extraocular Movements: Extraocular movements intact.     Conjunctiva/sclera: Conjunctivae normal.  Neck:     Comments: L trapezius tenderness  Normal ROM of neck Pulmonary:     Effort: Pulmonary effort is normal.  Musculoskeletal:        General: Normal range of motion.     Cervical back: Normal range of motion and neck supple.  Skin:    General: Skin is warm and dry.  Neurological:     Mental Status: She is alert and oriented to person, place, and time.  Psychiatric:        Attention and Perception: Attention and perception normal.        Mood and Affect: Mood normal.        Behavior: Behavior normal.        Thought Content: Thought content normal.        Judgment: Judgment normal.     Assessment and Plan:   Frequent headaches; Cervical pain No red flags on exam Suspect chronic tension HA from stress, insomnia and neck pain Limited medication options at this time Provided handout on cervical stretches Feel like she would be greatly benefit from PT Referral  placed  Encounter for other contraceptive management Depo administered today  I,Param Shah,acting as a scribe for [employer], PA.,have documented all relevant documentation on the behalf of [name], PA,as directed by  [name], PA while in the presence of [name],  PA.  Faythe Dingwall, PA, have reviewed all documentation for this visit. The documentation on 06/29/22 for the exam, diagnosis, procedures, and orders are all accurate and complete.   [name], PA-C

## 2022-06-29 NOTE — Patient Instructions (Signed)
It was great to see you!  Referral for physical therapy placed at this time.  Please work on Stress and Sleep!  Jan 4- 18th for your next depo.  Sleep Hygiene  Do: (1) Go to bed at the same time each day. (2) Get up from bed at the same time each day. (3) Get regular exercise each day, preferably in the morning.  There is goof evidence that regular exercise improves restful sleep.  This includes stretching and aerobic exercise. (4) Get regular exposure to outdoor or bright lights, especially in the late afternoon. (5) Keep the temperature in your bedroom comfortable. (6) Keep the bedroom quiet when sleeping. (7) Keep the bedroom dark enough to facilitate sleep. (8) Use your bed only for sleep and sex. (9) Take medications as directed.  It is helpful to take prescribed sleeping pills 1 hour before bedtime, so they are causing drowsiness when you lie down, or 10 hours before getting up, to avoid daytime drowsiness. (10) Use a relaxation exercise just before going to sleep -- imagery, massage, warm bath. (11) Keep your feet and hands warm.  Wear warm socks and/or mittens or gloves to bed.  Don't: (1) Exercise just before going to bed. (2) Engage in stimulating activity just before bed, such as playing a competitive game, watching an exciting program on television, or having an important discussion with a loved one. (3) Have caffeine in the evening (coffee, teas, chocolate, sodas, etc.) (4) Filter or watch television in bed. (5) Use alcohol to help you sleep. (6) Go to bed too hungry or too full. (7) Take another person's sleeping pills. (8) Take over-the-counter sleeping pills, without your doctor's knowledge.  Tolerance can develop rapidly with these medications.  Diphenhydramine can have serious side effects for elderly patients. (9) Take daytime naps. (10) Command yourself to go to sleep.  This only makes your mind and body more alert.  If you lie awake for more than 20-30 minutes,  get up, go to a different room, participate in a quiet activity (Ex - non-excitable reading or television), and then return to bed when you feel sleepy.  Do this as many times during the night as needed.  This may cause you to have a night or two of poor sleep but it will train your brain to know when it is time for sleep.

## 2022-07-19 ENCOUNTER — Encounter: Payer: Self-pay | Admitting: Physician Assistant

## 2022-07-19 DIAGNOSIS — L709 Acne, unspecified: Secondary | ICD-10-CM

## 2022-07-19 NOTE — Telephone Encounter (Signed)
Okay for referral to Dermatology?

## 2022-08-07 ENCOUNTER — Other Ambulatory Visit: Payer: Self-pay | Admitting: Physician Assistant

## 2022-08-24 DIAGNOSIS — F603 Borderline personality disorder: Secondary | ICD-10-CM | POA: Diagnosis not present

## 2022-08-24 DIAGNOSIS — F3162 Bipolar disorder, current episode mixed, moderate: Secondary | ICD-10-CM | POA: Diagnosis not present

## 2022-08-24 DIAGNOSIS — F4312 Post-traumatic stress disorder, chronic: Secondary | ICD-10-CM | POA: Diagnosis not present

## 2022-09-02 ENCOUNTER — Encounter: Payer: Self-pay | Admitting: Physician Assistant

## 2022-09-05 ENCOUNTER — Ambulatory Visit (HOSPITAL_BASED_OUTPATIENT_CLINIC_OR_DEPARTMENT_OTHER): Payer: Medicare Other | Attending: Physician Assistant | Admitting: Physical Therapy

## 2022-09-05 ENCOUNTER — Encounter (HOSPITAL_BASED_OUTPATIENT_CLINIC_OR_DEPARTMENT_OTHER): Payer: Self-pay | Admitting: Physical Therapy

## 2022-09-05 ENCOUNTER — Other Ambulatory Visit: Payer: Self-pay

## 2022-09-05 DIAGNOSIS — M542 Cervicalgia: Secondary | ICD-10-CM | POA: Diagnosis not present

## 2022-09-05 DIAGNOSIS — M62838 Other muscle spasm: Secondary | ICD-10-CM | POA: Diagnosis not present

## 2022-09-05 NOTE — Therapy (Addendum)
OUTPATIENT PHYSICAL THERAPY CERVICAL EVALUATION/discharge    Patient Name: Kathy Zimmerman MRN: [id number] DOB:03-22-1991, 31 y.o., female Today's Date: 09/05/2022  END OF SESSION:  PT End of Session - 09/05/22 1615     Visit Number 1    Number of Visits 6    Date for PT Re-Evaluation 10/17/22    PT Start Time [id number]    PT Stop Time 1558    PT Time Calculation (min) 43 min    Activity Tolerance Patient tolerated treatment well    Behavior During Therapy WFL for tasks assessed/performed             Past Medical History:  Diagnosis Date   Anxiety    Asthma    Depression    Headache    IBS (irritable bowel syndrome)    Panic attack    Preeclampsia    Past Surgical History:  Procedure Laterality Date   CESAREAN SECTION N/A 04/10/2015   Procedure: CESAREAN SECTION;  Surgeon: Crawford Givens, MD;  Location: [facility];  Service: Obstetrics;  Laterality: N/A;   EYE SURGERY     laser   SCLERAL BUCKLE  08/09/2011   Procedure: SCLERAL BUCKLE;  Surgeon: Clent Demark Rankin;  Location: [facility];  Service: Ophthalmology;  Laterality: Left;  with cryo   TENDON REPAIR     R middle finger   Patient Active Problem List   Diagnosis Date Noted   Mixed bipolar affective disorder, moderate ([place]) 12/11/2020   Asthma 04/19/2020   Depression 04/19/2020   Borderline personality disorder ([place]) 04/19/2020   PTSD (post-traumatic stress disorder) 08/15/2018   GAD (generalized anxiety disorder) 10/25/2017   Recurrent vertigo 07/12/2017   POTS (postural orthostatic tachycardia syndrome) 09/11/2016   Hx of severe preeclampsia 03/20/2016   Anxiety 04/11/2015   Retinal detachment of left eye with retinal break 08/08/2011    Class: Acute    PCP: [name] PA   REFERRING PROVIDER: [name] PA   REFERRING DIAG:   M54.2 (ICD-10-CM) - Cervical pain    THERAPY DIAG:  Cervicalgia  Other muscle spasm  Rationale for Evaluation and Treatment: Rehabilitation  ONSET DATE: worse ver the  past 3-4 months   SUBJECTIVE:                                                                                                                                                                                                         SUBJECTIVE STATEMENT: The patient reports over the last 3-4 months she has been having frequent headaches. This has been going on for a  while but worse ofver the past 3-4 months. She also has mid to upper back pain. Ta pain has always been there. The patient reports she is not getting consistent sleep because of her headaches.   PERTINENT HISTORY:  Anxiety, depression, heacdaches, panic attacks  PAIN:  Are you having pain? Yes: NPRS scale: 7/10 at worst  Pain location: the back of her head  Pain description: aching  Aggravating factors: movement of the head; gets worse as the day goes on  Relieving factors: nothing/ tries 3-4 tylenols   PRECAUTIONS: None  WEIGHT BEARING RESTRICTIONS: No  FALLS:  Has patient fallen in last 6 months? 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    CERVICAL ROM:   Active ROM A/PROM (deg) eval  Flexion 25 with tightness   Extension 38 with mild pain   Right lateral flexion   Left lateral flexion   Right rotation 60   Left rotation 40    (Blank rows = not tested)  UPPER EXTREMITY ROM:  Active ROM Right eval Left eval  Shoulder flexion    Shoulder extension    Shoulder abduction    Shoulder  adduction    Shoulder extension    Shoulder internal rotation    Shoulder external rotation    Elbow flexion    Elbow extension    Wrist flexion    Wrist extension    Wrist ulnar deviation    Wrist radial deviation    Wrist pronation    Wrist supination     (Blank rows = not tested)  UPPER EXTREMITY MMT:  MMT Right eval Left eval  Shoulder flexion WNL Muld pain at end range   Shoulder extension    Shoulder abduction    Shoulder adduction    Shoulder extension    Shoulder internal rotation WNL Tightness at end range   Shoulder external rotation WNL WNL  Middle trapezius    Lower trapezius    Elbow flexion    Elbow extension    Wrist flexion    Wrist extension    Wrist ulnar deviation    Wrist radial deviation    Wrist pronation    Wrist supination    Grip strength     (Blank rows = not tested)   TODAY'S TREATMENT:  DATE:   Access Code: [email or web address] URL: [email or web address] Date: 09/05/2022 Prepared by: Carolyne Littles  Exercises - Shoulder extension with resistance - Neutral  - 1 x daily - 7 x weekly - 3 sets - 10 reps - Scapular Retraction with Resistance  - 1 x daily - 7 x weekly - 3 sets - 10 reps - Gentle Levator Scapulae Stretch  - 1 x daily - 7 x weekly - 3 sets - 10 reps - Seated Upper Trapezius Stretch  - 1 x daily - 7 x weekly - 3 sets - 10 reps - Theracane Over Shoulder  - 3 x daily - 7 x weekly - 3 sets - 2 min  hold  Manual: trigger point release to upper traps and sub-occipital group   PATIENT EDUCATION:  Education details: HE, symptom management  Person educated: Patient Education method: Explanation, Demonstration, Tactile cues, Verbal cues, and Handouts Education comprehension: verbalized understanding, returned demonstration, verbal cues required, tactile cues required, and needs further education  HOME  EXERCISE PROGRAM: Access Code: [email or web address] URL: [email or web address] Date: 09/05/2022 Prepared by: Carolyne Littles  Exercises - Shoulder extension with resistance - Neutral  - 1 x daily - 7 x weekly - 3 sets - 10 reps - Scapular Retraction with Resistance  - 1 x daily - 7 x weekly - 3 sets - 10 reps - Gentle Levator Scapulae Stretch  - 1 x daily - 7 x weekly - 3 sets - 10 reps - Seated Upper Trapezius Stretch  - 1 x daily - 7 x weekly - 3 sets - 10 reps - Theracane Over Shoulder  - 3 x daily - 7 x weekly - 3 sets - 2 min  hold  ASSESSMENT:  CLINICAL IMPRESSION: Patient is a 31 y.o. female who was seen today for physical therapy evaluation and treatment for cervical pain and frequent headaches. She has significant spasming in her cervical musculature. She has limited range in rotation, flexion and extension. She was shown different headache patterns and the muscles that can cause them. We reviewed use of thera-cane for self soft tissue mobilization. She would benefit from further skilled therapy to improve cervical motion, and to decrease frequency and intensity of headaches. At this time she reports a daily headache.   OBJECTIVE IMPAIRMENTS: decreased activity tolerance, decreased ROM, decreased strength, impaired UE functional use, postural dysfunction, and pain.   ACTIVITY LIMITATIONS: carrying, lifting, sleeping, and reach over head  PARTICIPATION LIMITATIONS: meal prep, cleaning, laundry, driving, occupation, and yard work all when she has a headache  PERSONAL FACTORS: 1-2 comorbidities: past cervical spine pain in high school, anxiety, depression   are also affecting patient's functional outcome.   REHAB POTENTIAL: Good  CLINICAL DECISION MAKING: Evolving/moderate complexity headaches beoming more frequent and intense   EVALUATION COMPLEXITY: Moderate   GOALS: Goals reviewed with patient? Yes  SHORT TERM GOALS: Target date:   Patient will increase cervical  rotation bilateral by 15 degrees  Baseline:  Goal status: INITIAL  2.  Patient will reduce headache intensity by 50% with home program  Baseline:  Goal status: INITIAL  3.  Patient will verbalize stress relief strategies  Baseline:  Goal status: INITIAL   LONG TERM GOALS: Target date: 10/18/2022   Patient will demonstrate full pain free ROM when reaching overhead with left arm in order to perfrom ADLs  Baseline:  Goal status: INITIAL  2.  Patient will reach behind her back without pain  Baseline:  Goal status: INITIAL  3.  Patient will be  independent with complete HEP for headache management and cervical pain  Baseline:  Goal status: INITIAL PLAN:  PT FREQUENCY: 1x/week  PT DURATION: 6 weeks  PLANNED INTERVENTIONS: Therapeutic exercises, Therapeutic activity, Neuromuscular re-education, Patient/Family education, Self Care, Joint mobilization, Dry Needling, Electrical stimulation, Spinal mobilization, Cryotherapy, Moist heat, and Manual therapy  PLAN FOR NEXT SESSION: consider TPDN next visit. Manual therapy to upper cervical spine. Posterior chain strengthening. Consider adding in seated series next visit.   PHYSICAL THERAPY DISCHARGE SUMMARY  Visits from Start of Care: 1  Current functional level related to goals / functional outcomes: Did not return for follow up    Remaining deficits: Unknown    Education / Equipment: HEP   Patient agrees to discharge. Patient goals were not met. Patient is being discharged due to not returning since the last visit.  Carney Living, PT 09/05/2022, 4:18 PM

## 2022-09-06 ENCOUNTER — Encounter (HOSPITAL_BASED_OUTPATIENT_CLINIC_OR_DEPARTMENT_OTHER): Payer: Self-pay | Admitting: Physical Therapy

## 2022-09-14 ENCOUNTER — Ambulatory Visit (HOSPITAL_BASED_OUTPATIENT_CLINIC_OR_DEPARTMENT_OTHER): Payer: Medicare Other | Admitting: Physical Therapy

## 2022-09-15 ENCOUNTER — Ambulatory Visit: Payer: Medicare Other | Admitting: Physician Assistant

## 2022-09-18 ENCOUNTER — Ambulatory Visit (HOSPITAL_BASED_OUTPATIENT_CLINIC_OR_DEPARTMENT_OTHER): Payer: Medicare Other | Admitting: Physical Therapy

## 2022-09-19 ENCOUNTER — Encounter: Payer: Self-pay | Admitting: Physician Assistant

## 2022-09-19 ENCOUNTER — Other Ambulatory Visit: Payer: Self-pay | Admitting: Physician Assistant

## 2022-09-19 ENCOUNTER — Telehealth (INDEPENDENT_AMBULATORY_CARE_PROVIDER_SITE_OTHER): Payer: Medicare Other | Admitting: Physician Assistant

## 2022-09-19 VITALS — Ht 66.5 in | Wt 149.0 lb

## 2022-09-19 DIAGNOSIS — R635 Abnormal weight gain: Secondary | ICD-10-CM | POA: Diagnosis not present

## 2022-09-19 MED ORDER — TIRZEPATIDE 7.5 MG/0.5ML ~~LOC~~ SOAJ: 7.5000 mg | SUBCUTANEOUS | 1 refills | Status: DC

## 2022-09-19 NOTE — Progress Notes (Signed)
Kathy acted as a Education administrator for [employer], PA-C Anselmo Pickler, LPN  Virtual Visit via Video Note   Kathy [name], PA connected with  Kathy Zimmerman  ([id number], 02/06/1991) on 09/19/22 at 10:20 AM EST by a video-enabled telemedicine application and verified that Kathy am speaking with the correct person using two identifiers.  Location: Patient: Home Provider: [place] office   Kathy discussed the limitations of evaluation and management by telemedicine and the availability of in person appointments. The patient expressed understanding and agreed to proceed.    History of Present Illness: Kathy Zimmerman is a 32 y.o. who identifies as a female who was assigned female at birth, and is being seen today for Obesity. Pt is currently on Mounjaro 5 mg once a week. Pt says her appetite has increased, gained 6 lbs, feels like she is not even taking anything.  Wt Readings from Last 3 Encounters:  09/19/22 149 lb (67.6 kg)  06/29/22 143 lb 8 oz (65.1 kg)  06/12/22 142 lb (64.4 kg)    Problems:  Patient Active Problem List   Diagnosis Date Noted   Mixed bipolar affective disorder, moderate ([place]) 12/11/2020   Asthma 04/19/2020   Depression 04/19/2020   Borderline personality disorder ([place]) 04/19/2020   PTSD (post-traumatic stress disorder) 08/15/2018   GAD (generalized anxiety disorder) 10/25/2017   Recurrent vertigo 07/12/2017   POTS (postural orthostatic tachycardia syndrome) 09/11/2016   Hx of severe preeclampsia 03/20/2016   Anxiety 04/11/2015   Retinal detachment of left eye with retinal break 08/08/2011    Class: Acute    Allergies:  Allergies  Allergen Reactions   Cat Hair Extract Anaphylaxis   Shrimp [Shellfish Allergy] Anaphylaxis and Swelling   Sulfasalazine Rash   Sulfa Antibiotics Rash   Medications:  Current Outpatient Medications:    budesonide (PULMICORT) 1 MG/2ML nebulizer solution, Take 2 mLs (1 mg total) by nebulization in the morning and at  bedtime., Disp: 60 mL, Rfl: 0   busPIRone (BUSPAR) 10 MG tablet, Take 10 mg by mouth in the morning and at bedtime., Disp: , Rfl:    chlorproMAZINE (THORAZINE) 100 MG tablet, Take 100 mg by mouth at bedtime., Disp: , Rfl:    diphenhydrAMINE (BENADRYL) 50 MG tablet, Take 50 mg by mouth at bedtime as needed., Disp: , Rfl:    EPINEPHrine (EPIPEN 2-PAK) 0.3 mg/0.3 mL IJ SOAJ injection, Inject 0.3 mLs (0.3 mg total) into the muscle once as needed (for severe allergic reaction). CAll 911 immediately if you have to use this medicine, Disp: 1 Device, Rfl: 2   gabapentin (NEURONTIN) 600 MG tablet, Take 1,800 mg by mouth daily., Disp: , Rfl:    lithium 600 MG capsule, Take 600 mg by mouth daily., Disp: , Rfl:    lithium carbonate 300 MG capsule, Take 300 mg by mouth daily., Disp: , Rfl:    loperamide (IMODIUM A-D) 2 MG tablet, Take 1 tablet (2 mg total) by mouth 4 (four) times daily as needed for diarrhea or loose stools., Disp: 30 tablet, Rfl: 0   medroxyPROGESTERone (DEPO-PROVERA) 150 MG/ML injection, Inject 1 mL (150 mg total) into the muscle every 3 (three) months., Disp: 1 mL, Rfl: 0   megestrol (MEGACE) 40 MG tablet, TAKE 3 TABS X 5 DAYS, 2 TABS X 5 DAYS, 1 TAB DAILY TO CONTROL VAG BLEEDING, STOP WHEN BLEEDING STOPS,, Disp: 45 tablet, Rfl: 0   ondansetron (ZOFRAN) 4 MG tablet, Take 1 tablet (4 mg total) by mouth every  8 (eight) hours as needed for nausea or vomiting., Disp: 30 tablet, Rfl: 0   tirzepatide (MOUNJARO) 7.5 MG/0.5ML Pen, Inject 7.5 mg into the skin once a week., Disp: 6 mL, Rfl: 1   venlafaxine XR (EFFEXOR-XR) 150 MG 24 hr capsule, Take 150 mg by mouth daily., Disp: , Rfl:    venlafaxine XR (EFFEXOR-XR) 75 MG 24 hr capsule, Take by mouth., Disp: , Rfl:   Observations/Objective: Patient is well-developed, well-nourished in no acute distress.  Resting comfortably  at home.  Head is normocephalic, atraumatic.  No labored breathing.  Speech is clear and coherent with logical content.   Patient is alert and oriented at baseline.   Assessment and Plan: 1. Weight gain No red flags Will prescribe increased dose of 7.5 mg weekly Mounjaro for maintenance Discussed that if she goes below 140 lb, we will hold medication, or if she has dizziness/lightheadedness or other concerns Follow-up in 3 months, sooner if concerns  Follow Up Instructions: Kathy discussed the assessment and treatment plan with the patient. The patient was provided an opportunity to ask questions and all were answered. The patient agreed with the plan and demonstrated an understanding of the instructions.  A copy of instructions were sent to the patient via MyChart unless otherwise noted below.   The patient was advised to call back or seek an in-person evaluation if the symptoms worsen or if the condition fails to improve as anticipated.  Inda Zimmerman, [place]

## 2022-09-20 DIAGNOSIS — L700 Acne vulgaris: Secondary | ICD-10-CM | POA: Diagnosis not present

## 2022-09-25 ENCOUNTER — Encounter: Payer: Self-pay | Admitting: Physician Assistant

## 2022-09-25 MED ORDER — ZEPBOUND 7.5 MG/0.5ML ~~LOC~~ SOAJ: 7.5000 mg | SUBCUTANEOUS | 0 refills | Status: DC

## 2022-09-25 NOTE — Telephone Encounter (Signed)
Pt called back told her PA was not done due to [facility] is no longer covered unless you are Diabetic. Told pt we can try to send in Zepbound 7.5 mg to see if it is covered, but may not be due to BMI is too low. Pt verbalized understanding and said let's try. Told her okay will send Rx for Zepbound and see what happens. Pt verbalized understanding. Rx sent to pharmacy.

## 2022-09-25 NOTE — Telephone Encounter (Signed)
Left message on voicemail to call office.  

## 2022-09-27 ENCOUNTER — Encounter (HOSPITAL_BASED_OUTPATIENT_CLINIC_OR_DEPARTMENT_OTHER): Payer: Medicare Other | Admitting: Physical Therapy

## 2022-10-04 ENCOUNTER — Encounter (HOSPITAL_BASED_OUTPATIENT_CLINIC_OR_DEPARTMENT_OTHER): Payer: Medicare Other | Admitting: Physical Therapy

## 2022-10-05 ENCOUNTER — Ambulatory Visit: Payer: Medicare Other | Admitting: Physician Assistant

## 2022-10-05 ENCOUNTER — Ambulatory Visit (INDEPENDENT_AMBULATORY_CARE_PROVIDER_SITE_OTHER): Payer: Medicare Other | Admitting: *Deleted

## 2022-10-05 DIAGNOSIS — Z308 Encounter for other contraceptive management: Secondary | ICD-10-CM

## 2022-10-05 MED ORDER — MEDROXYPROGESTERONE ACETATE 150 MG/ML IM SUSP
150.0000 mg | Freq: Once | INTRAMUSCULAR | Status: AC
  Administered 2022-10-05: 150 mg via INTRAMUSCULAR

## 2022-10-05 NOTE — Progress Notes (Signed)
Per orders of [employer]. PA, injection of Depo-provera given in left deltoid per patient preference by Zacarias Pontes, CMA. Patient tolerated injection well. Patient reminded to schedule next injection for 3 months.

## 2022-10-18 ENCOUNTER — Emergency Department (HOSPITAL_COMMUNITY)
Admission: EM | Admit: 2022-10-18 | Discharge: 2022-10-18 | Disposition: A | Discharge status: Home / Self Care | Payer: Medicare Other | Attending: Emergency Medicine | Admitting: Emergency Medicine

## 2022-10-18 DIAGNOSIS — T7840XA Allergy, unspecified, initial encounter: Secondary | ICD-10-CM | POA: Diagnosis not present

## 2022-10-18 DIAGNOSIS — J392 Other diseases of pharynx: Secondary | ICD-10-CM | POA: Diagnosis present

## 2022-10-18 MED ORDER — PREDNISONE 20 MG PO TABS: ORAL_TABLET | ORAL | 0 refills | Status: DC

## 2022-10-18 MED ORDER — FAMOTIDINE 20 MG PO TABS
40.0000 mg | ORAL_TABLET | Freq: Once | ORAL | Status: AC
  Administered 2022-10-18: 40 mg via ORAL
  Filled 2022-10-18: qty 2

## 2022-10-18 MED ORDER — PREDNISONE 20 MG PO TABS
60.0000 mg | ORAL_TABLET | Freq: Once | ORAL | Status: AC
  Administered 2022-10-18: 60 mg via ORAL
  Filled 2022-10-18: qty 3

## 2022-10-18 NOTE — ED Triage Notes (Deleted)
Pt came in after eating lunch. Severe allergy to shellfish, Dined at [employer] where seafood touched same food she intended to eat. Started experiencing similar symptoms as previous reaction.

## 2022-10-18 NOTE — ED Provider Notes (Signed)
[facility] Provider Note   CSN: [id number] Arrival date & time: 10/18/22  1321     History  Chief Complaint  Patient presents with   Allergic Reaction    Kathy Zimmerman is a 32 y.o. female.  32 yo F with a chief complaints of an itchy and scratchy throat.  This occurred after she had come in contact with shellfish.  Has a known shellfish allergy.  She took 50 mg of Benadryl with some improvement.  She denies wheezing denies nausea vomiting denies feeling like she might pass out denies rash.   Allergic Reaction      Home Medications Prior to Admission medications   Medication Sig Start Date End Date Taking? Authorizing Provider  predniSONE (DELTASONE) 20 MG tablet 2 tabs po daily x 4 days 10/18/22  Yes Deno Etienne, DO  budesonide (PULMICORT) 1 MG/2ML nebulizer solution Take 2 mLs (1 mg total) by nebulization in the morning and at bedtime. 09/23/20   Orma Flaming, MD  busPIRone (BUSPAR) 10 MG tablet Take 10 mg by mouth in the morning and at bedtime. 04/13/20   [provider]  chlorproMAZINE (THORAZINE) 100 MG tablet Take 100 mg by mouth at bedtime. 02/07/22   [provider]  diphenhydrAMINE (BENADRYL) 50 MG tablet Take 50 mg by mouth at bedtime as needed.    [provider]  EPINEPHrine (EPIPEN 2-PAK) 0.3 mg/0.3 mL IJ SOAJ injection Inject 0.3 mLs (0.3 mg total) into the muscle once as needed (for severe allergic reaction). CAll 911 immediately if you have to use this medicine 06/23/17   Muthersbaugh, Jarrett Soho, PA-C  gabapentin (NEURONTIN) 600 MG tablet Take 1,800 mg by mouth daily. 04/02/20   [provider]  lithium 600 MG capsule Take 600 mg by mouth daily.    [provider]  lithium carbonate 300 MG capsule Take 300 mg by mouth daily. 08/26/21   [provider]  loperamide (IMODIUM A-D) 2 MG tablet Take 1 tablet (2 mg total) by mouth 4 (four) times daily as needed for diarrhea or  loose stools. 07/06/21   [name], PA  medroxyPROGESTERone (DEPO-PROVERA) 150 MG/ML injection Inject 1 mL (150 mg total) into the muscle every 3 (three) months. 04/06/22   [name], PA  megestrol (MEGACE) 40 MG tablet TAKE 3 TABS X 5 DAYS, 2 TABS X 5 DAYS, 1 TAB DAILY TO CONTROL VAG BLEEDING, STOP WHEN BLEEDING STOPS, 03/23/22   [name], PA  ondansetron (ZOFRAN) 4 MG tablet Take 1 tablet (4 mg total) by mouth every 8 (eight) hours as needed for nausea or vomiting. 09/21/21   [name], PA  tirzepatide (ZEPBOUND) 7.5 MG/0.5ML Pen Inject 7.5 mg into the skin once a week. 09/25/22   [name], PA  venlafaxine XR (EFFEXOR-XR) 150 MG 24 hr capsule Take 150 mg by mouth daily. 04/14/20   [provider]  venlafaxine XR (EFFEXOR-XR) 75 MG 24 hr capsule Take by mouth. 08/15/21   [provider]      Allergies    Cat hair extract, Shrimp [shellfish allergy], Sulfasalazine, and Sulfa antibiotics    Review of Systems   Review of Systems  Physical Exam Updated Vital Signs BP (!) 144/107 (BP Location: Left Arm)   Pulse (!) 108   Temp 98.4 F (36.9 C) (Oral)   Resp 16   SpO2 100%  Physical Exam Vitals and nursing note reviewed.  Constitutional:      General: She is not  in acute distress.    Appearance: She is well-developed. She is not diaphoretic.  HENT:     Head: Normocephalic and atraumatic.     Comments: Swollen turbinates, posterior nasal drip Eyes:     Pupils: Pupils are equal, round, and reactive to light.  Cardiovascular:     Rate and Rhythm: Normal rate and regular rhythm.     Heart sounds: No murmur heard.    No friction rub. No gallop.  Pulmonary:     Effort: Pulmonary effort is normal.     Breath sounds: No wheezing or rales.  Abdominal:     General: There is no distension.     Palpations: Abdomen is soft.     Tenderness: There is no abdominal tenderness.  Musculoskeletal:        General: No tenderness.     Cervical back:  Normal range of motion and neck supple.  Skin:    General: Skin is warm and dry.  Neurological:     Mental Status: She is alert and oriented to person, place, and time.  Psychiatric:        Behavior: Behavior normal.     ED Results / Procedures / Treatments   Labs (all labs ordered are listed, but only abnormal results are displayed) Labs Reviewed - No data to display  EKG None  Radiology No results found.  Procedures Procedures    Medications Ordered in ED Medications  predniSONE (DELTASONE) tablet 60 mg (60 mg Oral Given 10/18/22 1403)  famotidine (PEPCID) tablet 40 mg (40 mg Oral Given 10/18/22 1403)    ED Course/ Medical Decision Making/ A&P                             Medical Decision Making Risk Prescription drug management.   32 yo F with a chief complaint of an itchy and scratchy throat.  This is typical when she has an allergic reaction.  She came in contact with shellfish earlier today.  She took 50 mg of Benadryl without significant improvement.  Will give steroids Pepcid.  Reassess.  Observed here in the ED for about 2 hours postingestion without any worsening symptoms.  Patient actually stating some improvement.  Would like to go home at this time.  PCP follow-up.  3:23 PM:  I have discussed the diagnosis/risks/treatment options with the patient.  Evaluation and diagnostic testing in the emergency department does not suggest an emergent condition requiring admission or immediate intervention beyond what has been performed at this time.  They will follow up with PCP. We also discussed returning to the ED immediately if new or worsening sx occur. We discussed the sx which are most concerning (e.g., sudden worsening pain, fever, inability to tolerate by mouth) that necessitate immediate return. Medications administered to the patient during their visit and any new prescriptions provided to the patient are listed below.  Medications given during this  visit Medications  predniSONE (DELTASONE) tablet 60 mg (60 mg Oral Given 10/18/22 1403)  famotidine (PEPCID) tablet 40 mg (40 mg Oral Given 10/18/22 1403)     The patient appears reasonably screen and/or stabilized for discharge and I doubt any other medical condition or other [facility] requiring further screening, evaluation, or treatment in the ED at this time prior to discharge.          Final Clinical Impression(s) / ED Diagnoses Final diagnoses:  Allergic reaction, initial encounter    Rx / DC Orders  ED Discharge Orders          Ordered    predniSONE (DELTASONE) 20 MG tablet        10/18/22 1522              Deno Etienne, DO 10/18/22 1523

## 2022-10-18 NOTE — Discharge Instructions (Signed)
Please return for worsening symptoms difficulty breathing feeling and pass outs or difficulty breathing.  If you need to use your EpiPen and then return to the emergency department.

## 2022-10-18 NOTE — ED Triage Notes (Signed)
Pt states she was just eating [place] food when her food and utensils touched some shrimp which she has had anaphylaxis to in the past. Pt states she feels like her throat is tighter and her tongue its tingling, but denies SOB at this time. Pt did not use Epi Pen PTA, but did take 50 mg Benadryl.

## 2022-10-26 NOTE — Progress Notes (Signed)
Kathy Zimmerman is a 32 y.o. female here for a follow up of a pre-existing problem.  History of Present Illness:   Chief Complaint  Patient presents with   Weight Gain    Pt is concerned about gaining weight.    HPI  Allergic reaction Seen in ED on 10/18/22 for an allergic reaction. Came into contact with shellfish at [employer]. Treated conservatively in ED with Deltasone 60 mg, Pepcid 40 mg. Stable upon discharge. Did not require epinephrine.  H/o Obesity  Previously taking Zepbound but insurance no longer covers. Finished last dose about one month ago. She has been eating healthy. Has not been exercising, plans to start using step machine soon. She is very concerned about her recent weight gain.  Wt Readings from Last 3 Encounters:  11/01/22 156 lb 12.8 oz (71.1 kg)  09/19/22 149 lb (67.6 kg)  06/29/22 143 lb 8 oz (65.1 kg)   Sore throat Has some throat pain today. Not severe. Denies nausea, vomiting, fever, chills, cough. Feels like it is allergies. Has not taken anything for her symptoms.   Past Medical History:  Diagnosis Date   Anxiety    Asthma    Depression    Headache    IBS (irritable bowel syndrome)    Panic attack    Preeclampsia      Social History   Tobacco Use   Smoking status: Never   Smokeless tobacco: Never  Vaping Use   Vaping Use: Never used  Substance Use Topics   Alcohol use: No   Drug use: No    Past Surgical History:  Procedure Laterality Date   CESAREAN SECTION N/A 04/10/2015   Procedure: CESAREAN SECTION;  Surgeon: Crawford Givens, MD;  Location: [facility];  Service: Obstetrics;  Laterality: N/A;   EYE SURGERY     laser   SCLERAL BUCKLE  08/09/2011   Procedure: SCLERAL BUCKLE;  Surgeon: Clent Demark Rankin;  Location: [facility];  Service: Ophthalmology;  Laterality: Left;  with cryo   TENDON REPAIR     R middle finger    Family History  Problem Relation Age of Onset   Depression Father    Depression Sister    Mental illness  Sister    Depression Maternal Grandmother    Mental illness Maternal Grandmother    Thyroid disease Maternal Grandmother    Heart disease Maternal Grandfather    Hypertension Maternal Grandfather    Hyperlipidemia Maternal Grandfather    CAD Maternal Grandfather    Thyroid disease Other    CAD Other    Cancer Neg Hx     Allergies  Allergen Reactions   Cat Hair Extract Anaphylaxis   Shrimp [Shellfish Allergy] Anaphylaxis and Swelling   Sulfasalazine Rash   Sulfa Antibiotics Rash    Current Medications:   Current Outpatient Medications:    budesonide (PULMICORT) 1 MG/2ML nebulizer solution, Take 2 mLs (1 mg total) by nebulization in the morning and at bedtime., Disp: 60 mL, Rfl: 0   busPIRone (BUSPAR) 10 MG tablet, Take 10 mg by mouth in the morning and at bedtime., Disp: , Rfl:    chlorproMAZINE (THORAZINE) 100 MG tablet, Take 100 mg by mouth at bedtime., Disp: , Rfl:    diphenhydrAMINE (BENADRYL) 50 MG tablet, Take 50 mg by mouth at bedtime as needed., Disp: , Rfl:    EPINEPHrine (EPIPEN 2-PAK) 0.3 mg/0.3 mL IJ SOAJ injection, Inject 0.3 mLs (0.3 mg total) into the muscle once as needed (for severe allergic reaction).  CAll 911 immediately if you have to use this medicine, Disp: 1 Device, Rfl: 2   gabapentin (NEURONTIN) 600 MG tablet, Take 1,800 mg by mouth daily., Disp: , Rfl:    lithium 600 MG capsule, Take 600 mg by mouth daily., Disp: , Rfl:    lithium carbonate 300 MG capsule, Take 300 mg by mouth daily., Disp: , Rfl:    loperamide (IMODIUM A-D) 2 MG tablet, Take 1 tablet (2 mg total) by mouth 4 (four) times daily as needed for diarrhea or loose stools., Disp: 30 tablet, Rfl: 0   medroxyPROGESTERone (DEPO-PROVERA) 150 MG/ML injection, Inject 1 mL (150 mg total) into the muscle every 3 (three) months., Disp: 1 mL, Rfl: 0   megestrol (MEGACE) 40 MG tablet, TAKE 3 TABS X 5 DAYS, 2 TABS X 5 DAYS, 1 TAB DAILY TO CONTROL VAG BLEEDING, STOP WHEN BLEEDING STOPS,, Disp: 45 tablet, Rfl:  0   ondansetron (ZOFRAN) 4 MG tablet, Take 1 tablet (4 mg total) by mouth every 8 (eight) hours as needed for nausea or vomiting., Disp: 30 tablet, Rfl: 0   tretinoin (RETIN-A) 0.025 % cream, Apply 1 Application topically at bedtime., Disp: , Rfl:    venlafaxine XR (EFFEXOR-XR) 150 MG 24 hr capsule, Take 150 mg by mouth daily., Disp: , Rfl:    venlafaxine XR (EFFEXOR-XR) 75 MG 24 hr capsule, Take by mouth., Disp: , Rfl:    predniSONE (DELTASONE) 20 MG tablet, 2 tabs po daily x 4 days (Patient not taking: Reported on 11/01/2022), Disp: 8 tablet, Rfl: 0   Review of Systems:   Review of Systems  Constitutional:  Negative for fever and malaise/fatigue.  HENT:  Positive for sore throat. Negative for congestion.   Eyes:  Negative for blurred vision.  Respiratory:  Negative for cough and shortness of breath.   Cardiovascular:  Negative for chest pain, palpitations and leg swelling.  Gastrointestinal:  Negative for vomiting.  Musculoskeletal:  Negative for back pain.  Skin:  Negative for rash.  Neurological:  Negative for loss of consciousness and headaches.  Psychiatric/Behavioral:  The patient is nervous/anxious (Regarding weight gain).     Vitals:   Vitals:   11/01/22 1501  BP: 110/78  Pulse: 97  Temp: 98.4 F (36.9 C)  TempSrc: Temporal  SpO2: 99%  Weight: 156 lb 12.8 oz (71.1 kg)  Height: 5' 6.5" (1.689 m)     Body mass index is 24.93 kg/m.  Physical Exam:   Physical Exam Vitals and nursing note reviewed.  Constitutional:      General: She is not in acute distress.    Appearance: She is well-developed. She is not ill-appearing or toxic-appearing.  HENT:     Head: Normocephalic and atraumatic.     Right Ear: Tympanic membrane, ear canal and external ear normal. Tympanic membrane is not erythematous, retracted or bulging.     Left Ear: Tympanic membrane, ear canal and external ear normal. Tympanic membrane is not erythematous, retracted or bulging.     Nose: Nose normal.      Right Sinus: No maxillary sinus tenderness or frontal sinus tenderness.     Left Sinus: No maxillary sinus tenderness or frontal sinus tenderness.     Mouth/Throat:     Pharynx: Uvula midline. Posterior oropharyngeal erythema present.     Tonsils: No tonsillar exudate. 2+ on the right. 2+ on the left.  Eyes:     General: Lids are normal.     Conjunctiva/sclera: Conjunctivae normal.  Neck:  Trachea: Trachea normal.  Cardiovascular:     Rate and Rhythm: Normal rate and regular rhythm.     Heart sounds: Normal heart sounds, S1 normal and S2 normal.  Pulmonary:     Effort: Pulmonary effort is normal.     Breath sounds: Normal breath sounds. No decreased breath sounds, wheezing, rhonchi or rales.  Lymphadenopathy:     Cervical: No cervical adenopathy.  Skin:    General: Skin is warm and dry.  Neurological:     Mental Status: She is alert.  Psychiatric:        Speech: Speech normal.        Behavior: Behavior normal. Behavior is cooperative.    Results for orders placed or performed in visit on 11/01/22  POCT rapid strep A  Result Value Ref Range   Rapid Strep A Screen Negative Negative    Assessment and Plan:   Sore throat No red flags on exam.   Strep test is negative. Suspect viral URI Recommend supportive care including tylenol for pain prn.  Reviewed return precautions including worsening fever, SOB, worsening cough or other concerns. Push fluids and rest. I recommend that patient follow-up if symptoms worsen or persist despite treatment x 7-10 days, sooner if needed.  Weight gain Unable to rx GLP-1 at this time due to insurance and BMI purposes Recommend working on adding in healthy exercise  Allergic reaction, subsequent encounter No red flags Has recovered well overall Continue to monitor sx and avoid shellfish  I,Alexander Ruley,acting as a scribe for [employer], PA.,have documented all relevant documentation on the behalf of [name], PA,as  directed by  [name], PA while in the presence of [name], [place].  I, [name], [place], have reviewed all documentation for this visit. The documentation on 11/01/22 for the exam, diagnosis, procedures, and orders are all accurate and complete.   [name], PA-C

## 2022-11-01 ENCOUNTER — Encounter: Payer: Self-pay | Admitting: Physician Assistant

## 2022-11-01 ENCOUNTER — Ambulatory Visit (INDEPENDENT_AMBULATORY_CARE_PROVIDER_SITE_OTHER): Payer: Medicare Other | Admitting: Physician Assistant

## 2022-11-01 VITALS — BP 110/78 | HR 97 | Temp 98.4°F | Ht 66.5 in | Wt 156.8 lb

## 2022-11-01 DIAGNOSIS — T7840XD Allergy, unspecified, subsequent encounter: Secondary | ICD-10-CM

## 2022-11-01 DIAGNOSIS — J029 Acute pharyngitis, unspecified: Secondary | ICD-10-CM

## 2022-11-01 DIAGNOSIS — R635 Abnormal weight gain: Secondary | ICD-10-CM | POA: Diagnosis not present

## 2022-11-01 LAB — POCT RAPID STREP A (OFFICE): Rapid Strep A Screen: NEGATIVE

## 2022-11-01 NOTE — Patient Instructions (Signed)
Wt Readings from Last 3 Encounters:  11/01/22 156 lb 12.8 oz (71.1 kg)  09/19/22 149 lb (67.6 kg)  06/29/22 143 lb 8 oz (65.1 kg)

## 2022-11-05 ENCOUNTER — Encounter: Payer: Self-pay | Admitting: Physician Assistant

## 2022-11-06 MED ORDER — ONDANSETRON HCL 4 MG PO TABS: 4.0000 mg | ORAL_TABLET | Freq: Three times a day (TID) | ORAL | 0 refills | Status: DC | PRN

## 2022-11-14 NOTE — Progress Notes (Unsigned)
New Patient Note  RE: Kathy Zimmerman MRN: [id number] DOB: 26-Dec-1990 Date of Office Visit: 11/15/2022  Consult requested by: [name], [place] Primary care provider: [name], [place]  Chief Complaint: No chief complaint on file.  History of Present Illness: I had the pleasure of seeing Kathy Zimmerman for initial evaluation at the [facility] on 11/14/2022. She is a 32 y.o. female, who is referred here by [name], PA for the evaluation of ***.  ***  Assessment and Plan: Kathy Zimmerman is a 32 y.o. female with: No problem-specific Assessment & Plan notes found for this encounter.  No follow-ups on file.  No orders of the defined types were placed in this encounter.  Lab Orders  No laboratory test(s) ordered today    Other allergy screening: Asthma: {Blank single:19197::"yes","no"} Rhino conjunctivitis: {Blank single:19197::"yes","no"} Food allergy: {Blank single:19197::"yes","no"} Medication allergy: {Blank single:19197::"yes","no"} Hymenoptera allergy: {Blank single:19197::"yes","no"} Urticaria: {Blank single:19197::"yes","no"} Eczema:{Blank single:19197::"yes","no"} History of recurrent infections suggestive of immunodeficency: {Blank single:19197::"yes","no"}  Diagnostics: Spirometry:  Tracings reviewed. Her effort: {Blank single:19197::"Good reproducible efforts.","It was hard to get consistent efforts and there is a question as to whether this reflects a maximal maneuver.","Poor effort, data can not be interpreted."} FVC: ***L FEV1: ***L, ***% predicted FEV1/FVC ratio: ***% Interpretation: {Blank single:19197::"Spirometry consistent with mild obstructive disease","Spirometry consistent with moderate obstructive disease","Spirometry consistent with severe obstructive disease","Spirometry consistent with possible restrictive disease","Spirometry consistent with mixed obstructive and restrictive disease","Spirometry uninterpretable due to  technique","Spirometry consistent with normal pattern","No overt abnormalities noted given today's efforts"}.  Please see scanned spirometry results for details.  Skin Testing: {Blank single:19197::"Select foods","Environmental allergy panel","Environmental allergy panel and select foods","Food allergy panel","None","Deferred due to recent antihistamines use"}. *** Results discussed with patient/family.   Past Medical History: Patient Active Problem List   Diagnosis Date Noted  . Mixed bipolar affective disorder, moderate ([place]) 12/11/2020  . Asthma 04/19/2020  . Depression 04/19/2020  . Borderline personality disorder ([place]) 04/19/2020  . PTSD (post-traumatic stress disorder) 08/15/2018  . GAD (generalized anxiety disorder) 10/25/2017  . Recurrent vertigo 07/12/2017  . POTS (postural orthostatic tachycardia syndrome) 09/11/2016  . Hx of severe preeclampsia 03/20/2016  . Anxiety 04/11/2015  . Retinal detachment of left eye with retinal break 08/08/2011    Class: Acute   Past Medical History:  Diagnosis Date  . Anxiety   . Asthma   . Depression   . Headache   . IBS (irritable bowel syndrome)   . Panic attack   . Preeclampsia    Past Surgical History: Past Surgical History:  Procedure Laterality Date  . CESAREAN SECTION N/A 04/10/2015   Procedure: CESAREAN SECTION;  Surgeon: Crawford Givens, MD;  Location: [facility];  Service: Obstetrics;  Laterality: N/A;  . EYE SURGERY     laser  . SCLERAL BUCKLE  08/09/2011   Procedure: SCLERAL BUCKLE;  Surgeon: Clent Demark Rankin;  Location: [facility];  Service: Ophthalmology;  Laterality: Left;  with cryo  . TENDON REPAIR     R middle finger   Medication List:  Current Outpatient Medications  Medication Sig Dispense Refill  . budesonide (PULMICORT) 1 MG/2ML nebulizer solution Take 2 mLs (1 mg total) by nebulization in the morning and at bedtime. 60 mL 0  . busPIRone (BUSPAR) 10 MG tablet Take 10 mg by mouth in the morning and at bedtime.    .  chlorproMAZINE (THORAZINE) 100 MG tablet Take 100 mg by mouth at bedtime.    . diphenhydrAMINE (BENADRYL) 50 MG tablet Take 50 mg  by mouth at bedtime as needed.    Marland Kitchen EPINEPHrine (EPIPEN 2-PAK) 0.3 mg/0.3 mL IJ SOAJ injection Inject 0.3 mLs (0.3 mg total) into the muscle once as needed (for severe allergic reaction). CAll 911 immediately if you have to use this medicine 1 Device 2  . gabapentin (NEURONTIN) 600 MG tablet Take 1,800 mg by mouth daily.    Marland Kitchen lithium 600 MG capsule Take 600 mg by mouth daily.    Marland Kitchen lithium carbonate 300 MG capsule Take 300 mg by mouth daily.    Marland Kitchen loperamide (IMODIUM A-D) 2 MG tablet Take 1 tablet (2 mg total) by mouth 4 (four) times daily as needed for diarrhea or loose stools. 30 tablet 0  . medroxyPROGESTERone (DEPO-PROVERA) 150 MG/ML injection Inject 1 mL (150 mg total) into the muscle every 3 (three) months. 1 mL 0  . megestrol (MEGACE) 40 MG tablet TAKE 3 TABS X 5 DAYS, 2 TABS X 5 DAYS, 1 TAB DAILY TO CONTROL VAG BLEEDING, STOP WHEN BLEEDING STOPS, 45 tablet 0  . ondansetron (ZOFRAN) 4 MG tablet Take 1 tablet (4 mg total) by mouth every 8 (eight) hours as needed for nausea or vomiting. 30 tablet 0  . predniSONE (DELTASONE) 20 MG tablet 2 tabs po daily x 4 days (Patient not taking: Reported on 11/01/2022) 8 tablet 0  . tretinoin (RETIN-A) 0.025 % cream Apply 1 Application topically at bedtime.    Marland Kitchen venlafaxine XR (EFFEXOR-XR) 150 MG 24 hr capsule Take 150 mg by mouth daily.    Marland Kitchen venlafaxine XR (EFFEXOR-XR) 75 MG 24 hr capsule Take by mouth.     No current facility-administered medications for this visit.   Allergies: Allergies  Allergen Reactions  . Cat Hair Extract Anaphylaxis  . Shrimp [Shellfish Allergy] Anaphylaxis and Swelling  . Sulfasalazine Rash  . Sulfa Antibiotics Rash   Social History: Social History   Socioeconomic History  . Marital status: Legally Separated    Spouse name: Not on file  . Number of children: 2  . Years of education: Not on  file  . Highest education level: Not on file  Occupational History  . Not on file  Tobacco Use  . Smoking status: Never  . Smokeless tobacco: Never  Vaping Use  . Vaping Use: Never used  Substance and Sexual Activity  . Alcohol use: No  . Drug use: No  . Sexual activity: Yes    Birth control/protection: Injection  Other Topics Concern  . Not on file  Social History Narrative   Moved to [place]   54 and 31 year old boys (2021)   7 years married   In a relationship   Social Determinants of Health   Financial Resource Strain: Not on file  Food Insecurity: Not on file  Transportation Needs: Not on file  Physical Activity: Not on file  Stress: Not on file  Social Connections: Not on file   Lives in a ***. Smoking: *** Occupation: ***  Environmental HistoryFreight forwarder in the house: Estate agent in the family room: {Blank single:19197::"yes","no"} Carpet in the bedroom: {Blank single:19197::"yes","no"} Heating: {Blank single:19197::"electric","gas","heat pump"} Cooling: {Blank single:19197::"central","window","heat pump"} Pet: {Blank single:19197::"yes ***","no"}  Family History: Family History  Problem Relation Age of Onset  . Depression Father   . Depression Sister   . Mental illness Sister   . Depression Maternal Grandmother   . Mental illness Maternal Grandmother   . Thyroid disease Maternal Grandmother   . Heart disease Maternal Grandfather   . Hypertension  Maternal Grandfather   . Hyperlipidemia Maternal Grandfather   . CAD Maternal Grandfather   . Thyroid disease Other   . CAD Other   . Cancer Neg Hx    Problem                               Relation Asthma                                   *** Eczema                                *** Food allergy                          *** Allergic rhino conjunctivitis     ***  Review of Systems  Constitutional:  Negative for appetite change, chills, fever and unexpected  weight change.  HENT:  Negative for congestion and rhinorrhea.   Eyes:  Negative for itching.  Respiratory:  Negative for cough, chest tightness, shortness of breath and wheezing.   Cardiovascular:  Negative for chest pain.  Gastrointestinal:  Negative for abdominal pain.  Genitourinary:  Negative for difficulty urinating.  Skin:  Negative for rash.  Neurological:  Negative for headaches.   Objective: There were no vitals taken for this visit. There is no height or weight on file to calculate BMI. Physical Exam Vitals and nursing note reviewed.  Constitutional:      Appearance: Normal appearance. She is well-developed.  HENT:     Head: Normocephalic and atraumatic.     Right Ear: Tympanic membrane and external ear normal.     Left Ear: Tympanic membrane and external ear normal.     Nose: Nose normal.     Mouth/Throat:     Mouth: Mucous membranes are moist.     Pharynx: Oropharynx is clear.  Eyes:     Conjunctiva/sclera: Conjunctivae normal.  Cardiovascular:     Rate and Rhythm: Normal rate and regular rhythm.     Heart sounds: Normal heart sounds. No murmur heard.    No friction rub. No gallop.  Pulmonary:     Effort: Pulmonary effort is normal.     Breath sounds: Normal breath sounds. No wheezing, rhonchi or rales.  Musculoskeletal:     Cervical back: Neck supple.  Skin:    General: Skin is warm.     Findings: No rash.  Neurological:     Mental Status: She is alert and oriented to person, place, and time.  Psychiatric:        Behavior: Behavior normal.  The plan was reviewed with the patient/family, and all questions/concerned were addressed.  It was my pleasure to see Kathy Zimmerman today and participate in her care. Please feel free to contact me with any questions or concerns.  Sincerely,  Rexene Alberts, DO Allergy & Immunology  [facility] office: [place] office: [phone]

## 2022-11-15 ENCOUNTER — Encounter: Payer: Self-pay | Admitting: Allergy

## 2022-11-15 ENCOUNTER — Other Ambulatory Visit: Payer: Self-pay

## 2022-11-15 ENCOUNTER — Ambulatory Visit (INDEPENDENT_AMBULATORY_CARE_PROVIDER_SITE_OTHER): Payer: Medicare Other | Admitting: Allergy

## 2022-11-15 VITALS — BP 118/82 | HR 100 | Temp 98.5°F | Resp 16 | Ht 66.24 in | Wt 153.8 lb

## 2022-11-15 DIAGNOSIS — T7840XA Allergy, unspecified, initial encounter: Secondary | ICD-10-CM

## 2022-11-15 DIAGNOSIS — J3089 Other allergic rhinitis: Secondary | ICD-10-CM | POA: Insufficient documentation

## 2022-11-15 DIAGNOSIS — T7840XD Allergy, unspecified, subsequent encounter: Secondary | ICD-10-CM

## 2022-11-15 DIAGNOSIS — J4520 Mild intermittent asthma, uncomplicated: Secondary | ICD-10-CM

## 2022-11-15 DIAGNOSIS — T7800XD Anaphylactic reaction due to unspecified food, subsequent encounter: Secondary | ICD-10-CM | POA: Diagnosis not present

## 2022-11-15 MED ORDER — EPINEPHRINE 0.3 MG/0.3ML IJ SOAJ: 0.3000 mg | INTRAMUSCULAR | 1 refills | Status: DC | PRN

## 2022-11-15 NOTE — Patient Instructions (Addendum)
Today's skin testing showed: Borderline positive to horse only. Negative to seafood.   Results given.  Food Continue strict avoidance of shellfish - be careful about cross contamination. The patients history suggests shellfish allergy, though todays skin tests were negative despite a positive histamine control.  Food allergen skin testing has excellent negative predictive value however there is still a small chance that the allergy exists. Therefore, we will investigate further with serum specific IgE levels and, if negative then schedule for open graded oral food challenge. A laboratory order form has been provided for serum specific IgE against seafood.  Epinephrine injectable device refilled. For mild symptoms you can take over the counter antihistamines such as Benadryl 1-2 tablets = 25-'50mg'$  and monitor symptoms closely. If symptoms worsen or if you have severe symptoms including breathing issues, throat closure, significant swelling, whole body hives, severe diarrhea and vomiting, lightheadedness then inject epinephrine and seek immediate medical care afterwards. Emergency action plan given.  Environmental allergies Start environmental control measures as below regarding cat exposure. Wear a mask when going to someone's house with a cat. Take an allergy pill beforehand. Come home and take a shower and change your clothes.  Use over the counter antihistamines such as Zyrtec (cetirizine), Claritin (loratadine), Allegra (fexofenadine), or Xyzal (levocetirizine) daily as needed. May take twice a day during allergy flares. May switch antihistamines every few months. Get bloodwork.  Asthma Normal breathing test today. May use albuterol rescue inhaler 2 puffs every 4 to 6 hours as needed for shortness of breath, chest tightness, coughing, and wheezing. Monitor frequency of use.   Follow up in 6 months or sooner if needed.    Pet Allergen Avoidance: Contrary to popular opinion, there are  no "hypoallergenic" breeds of dogs or cats. That is because people are not allergic to an animal's hair, but to an allergen found in the animal's saliva, dander (dead skin flakes) or urine. Pet allergy symptoms typically occur within minutes. For some people, symptoms can build up and become most severe 8 to 12 hours after contact with the animal. People with severe allergies can experience reactions in public places if dander has been transported on the pet owners' clothing. Keeping an animal outdoors is only a partial solution, since homes with pets in the yard still have higher concentrations of animal allergens. Before getting a pet, ask your allergist to determine if you are allergic to animals. If your pet is already considered part of your family, try to minimize contact and keep the pet out of the bedroom and other rooms where you spend a great deal of time. As with dust mites, vacuum carpets often or replace carpet with a hardwood floor, tile or linoleum. High-efficiency particulate air (HEPA) cleaners can reduce allergen levels over time. While dander and saliva are the source of cat and dog allergens, urine is the source of allergens from rabbits, hamsters, mice and [place] pigs; so ask a non-allergic family member to clean the animal's cage. If you have a pet allergy, talk to your allergist about the potential for allergy immunotherapy (allergy shots). This strategy can often provide long-term relief.

## 2022-11-15 NOTE — Assessment & Plan Note (Signed)
Mainly exercise triggered and only has to use albuterol a few times per year. Today's spirometry was normal. May use albuterol rescue inhaler 2 puffs every 4 to 6 hours as needed for shortness of breath, chest tightness, coughing, and wheezing. Monitor frequency of use.

## 2022-11-15 NOTE — Assessment & Plan Note (Signed)
Reaction to shrimp in her 77s with lip tingling, throat tightness and hives. Recently had cross-contamination with shrimp at [employer] that required ER visit and Epinephrine. Symptoms improved immediately afterwards. Bloodwork in the past was positive to shrimp per patient. Avoiding all seafood due to above reaction. Today's skin testing showed: Negative to all seafood.  Continue strict avoidance of shellfish - be careful about cross contamination. The patients history suggests shellfish allergy, though todays skin tests were negative despite a positive histamine control.  Food allergen skin testing has excellent negative predictive value however there is still a small chance that the allergy exists. Therefore, we will investigate further with serum specific IgE levels and, if negative then schedule for open graded oral food challenge. A laboratory order form has been provided for serum specific IgE against seafood. Epinephrine injectable device refilled. For mild symptoms you can take over the counter antihistamines such as Benadryl 1-2 tablets = 25-'50mg'$  and monitor symptoms closely. If symptoms worsen or if you have severe symptoms including breathing issues, throat closure, significant swelling, whole body hives, severe diarrhea and vomiting, lightheadedness then inject epinephrine and seek immediate medical care afterwards. Emergency action plan given.

## 2022-11-15 NOTE — Assessment & Plan Note (Signed)
Known allergy to cats and in July had cat exposure at friend's house which caused lip tingling, throat tightness and chest tightness. Took Benadryl then went too UC where she was given  a steroid injection. Did not go to ER at that time. Bloodwork in the past was positive to cat per patient. Today's skin prick testing was borderline positive to horse only.  Get bloodwork as already getting bloodwork.  Start environmental control measures as below regarding cat exposure. Wear a mask when going to someone's house with a cat. Take an allergy pill beforehand. Come home and take a shower and change your clothes.  Use over the counter antihistamines such as Zyrtec (cetirizine), Claritin (loratadine), Allegra (fexofenadine), or Xyzal (levocetirizine) daily as needed. May take twice a day during allergy flares. May switch antihistamines every few months.

## 2022-11-17 LAB — TRYPTASE: Tryptase: 9.5 ug/L (ref 2.2–13.2)

## 2022-11-20 LAB — ALLERGENS W/TOTAL IGE AREA 2
Alternaria Alternata IgE: <0.1 kU/L
Aspergillus Fumigatus IgE: <0.1 kU/L
Bermuda Grass IgE: <0.1 kU/L
Cat Dander IgE: 3.78 kU/L — AB
Cedar, Mountain IgE: <0.1 kU/L
Cladosporium Herbarum IgE: <0.1 kU/L
Cockroach, German IgE: 0.33 kU/L — AB
Common Silver Birch IgE: <0.1 kU/L
Cottonwood IgE: <0.1 kU/L
D Farinae IgE: <0.1 kU/L
D Pteronyssinus IgE: 0.11 kU/L — AB
Dog Dander IgE: 1.01 kU/L — AB
Elm, American IgE: <0.1 kU/L
IgE (Immunoglobulin E), Serum: 28 IU/mL (ref 6–495)
Johnson Grass IgE: <0.1 kU/L
Maple/Box Elder IgE: <0.1 kU/L
Mouse Urine IgE: <0.1 kU/L
Oak, White IgE: <0.1 kU/L
Pecan, Hickory IgE: <0.1 kU/L
Penicillium Chrysogen IgE: <0.1 kU/L
Pigweed, Rough IgE: <0.1 kU/L
Ragweed, Short IgE: <0.1 kU/L
Sheep Sorrel IgE Qn: <0.1 kU/L
Timothy Grass IgE: <0.1 kU/L
White Mulberry IgE: <0.1 kU/L

## 2022-11-20 LAB — ALLERGEN PROFILE, SHELLFISH
Clam IgE: <0.1 kU/L
F023-IgE Crab: <0.1 kU/L
F080-IgE Lobster: <0.1 kU/L
F290-IgE Oyster: <0.1 kU/L
Scallop IgE: <0.1 kU/L
Shrimp IgE: 0.28 kU/L — AB

## 2022-11-20 LAB — ALLERGEN PROFILE, FOOD-FISH
Allergen Mackerel IgE: <0.1 kU/L
Allergen Salmon IgE: <0.1 kU/L
Allergen Trout IgE: <0.1 kU/L
Allergen Walley Pike IgE: <0.1 kU/L
Codfish IgE: <0.1 kU/L
Halibut IgE: <0.1 kU/L
Tuna: <0.1 kU/L

## 2022-11-28 DIAGNOSIS — E559 Vitamin D deficiency, unspecified: Secondary | ICD-10-CM | POA: Diagnosis not present

## 2022-11-28 DIAGNOSIS — R7989 Other specified abnormal findings of blood chemistry: Secondary | ICD-10-CM | POA: Diagnosis not present

## 2022-11-28 DIAGNOSIS — F4310 Post-traumatic stress disorder, unspecified: Secondary | ICD-10-CM | POA: Diagnosis not present

## 2022-11-28 DIAGNOSIS — Z6824 Body mass index (BMI) 24.0-24.9, adult: Secondary | ICD-10-CM | POA: Diagnosis not present

## 2022-11-28 DIAGNOSIS — F419 Anxiety disorder, unspecified: Secondary | ICD-10-CM | POA: Diagnosis not present

## 2022-11-28 DIAGNOSIS — N951 Menopausal and female climacteric states: Secondary | ICD-10-CM | POA: Diagnosis not present

## 2022-11-28 DIAGNOSIS — E7800 Pure hypercholesterolemia, unspecified: Secondary | ICD-10-CM | POA: Diagnosis not present

## 2022-11-28 DIAGNOSIS — R635 Abnormal weight gain: Secondary | ICD-10-CM | POA: Diagnosis not present

## 2022-12-12 DIAGNOSIS — Z6824 Body mass index (BMI) 24.0-24.9, adult: Secondary | ICD-10-CM | POA: Diagnosis not present

## 2022-12-12 DIAGNOSIS — E559 Vitamin D deficiency, unspecified: Secondary | ICD-10-CM | POA: Diagnosis not present

## 2022-12-12 DIAGNOSIS — E7800 Pure hypercholesterolemia, unspecified: Secondary | ICD-10-CM | POA: Diagnosis not present

## 2022-12-12 DIAGNOSIS — Z1331 Encounter for screening for depression: Secondary | ICD-10-CM | POA: Diagnosis not present

## 2022-12-12 DIAGNOSIS — Z1339 Encounter for screening examination for other mental health and behavioral disorders: Secondary | ICD-10-CM | POA: Diagnosis not present

## 2022-12-12 DIAGNOSIS — F331 Major depressive disorder, recurrent, moderate: Secondary | ICD-10-CM | POA: Diagnosis not present

## 2022-12-26 ENCOUNTER — Ambulatory Visit: Payer: Medicare Other

## 2022-12-27 ENCOUNTER — Ambulatory Visit (INDEPENDENT_AMBULATORY_CARE_PROVIDER_SITE_OTHER): Payer: Medicare Other

## 2022-12-27 DIAGNOSIS — Z308 Encounter for other contraceptive management: Secondary | ICD-10-CM | POA: Diagnosis not present

## 2022-12-27 MED ORDER — MEDROXYPROGESTERONE ACETATE 150 MG/ML IM SUSP
150.0000 mg | Freq: Once | INTRAMUSCULAR | Status: AC
  Administered 2022-12-27: 150 mg via INTRAMUSCULAR

## 2022-12-27 NOTE — Progress Notes (Signed)
Kathy Zimmerman 32 yr old female presents to office today for 3 month birth control encounter per [name], [place]. Administered MEDROXYPROGESTERONE ACETATE 150 mg/mL IM left arm per patient preference. Aware to return in 3 months for next injection.

## 2023-01-01 DIAGNOSIS — Z6824 Body mass index (BMI) 24.0-24.9, adult: Secondary | ICD-10-CM | POA: Diagnosis not present

## 2023-01-01 DIAGNOSIS — E7800 Pure hypercholesterolemia, unspecified: Secondary | ICD-10-CM | POA: Diagnosis not present

## 2023-01-09 DIAGNOSIS — Z6825 Body mass index (BMI) 25.0-25.9, adult: Secondary | ICD-10-CM | POA: Diagnosis not present

## 2023-01-09 DIAGNOSIS — E559 Vitamin D deficiency, unspecified: Secondary | ICD-10-CM | POA: Diagnosis not present

## 2023-01-09 DIAGNOSIS — F319 Bipolar disorder, unspecified: Secondary | ICD-10-CM | POA: Diagnosis not present

## 2023-01-11 DIAGNOSIS — F4312 Post-traumatic stress disorder, chronic: Secondary | ICD-10-CM | POA: Diagnosis not present

## 2023-01-11 DIAGNOSIS — F603 Borderline personality disorder: Secondary | ICD-10-CM | POA: Diagnosis not present

## 2023-01-11 DIAGNOSIS — F3162 Bipolar disorder, current episode mixed, moderate: Secondary | ICD-10-CM | POA: Diagnosis not present

## 2023-01-25 DIAGNOSIS — F603 Borderline personality disorder: Secondary | ICD-10-CM | POA: Diagnosis not present

## 2023-01-25 DIAGNOSIS — F3162 Bipolar disorder, current episode mixed, moderate: Secondary | ICD-10-CM | POA: Diagnosis not present

## 2023-01-25 DIAGNOSIS — F4312 Post-traumatic stress disorder, chronic: Secondary | ICD-10-CM | POA: Diagnosis not present

## 2023-01-30 DIAGNOSIS — Z6826 Body mass index (BMI) 26.0-26.9, adult: Secondary | ICD-10-CM | POA: Diagnosis not present

## 2023-01-30 DIAGNOSIS — E559 Vitamin D deficiency, unspecified: Secondary | ICD-10-CM | POA: Diagnosis not present

## 2023-01-30 DIAGNOSIS — E7800 Pure hypercholesterolemia, unspecified: Secondary | ICD-10-CM | POA: Diagnosis not present

## 2023-02-07 DIAGNOSIS — F3162 Bipolar disorder, current episode mixed, moderate: Secondary | ICD-10-CM | POA: Diagnosis not present

## 2023-02-07 DIAGNOSIS — F4312 Post-traumatic stress disorder, chronic: Secondary | ICD-10-CM | POA: Diagnosis not present

## 2023-02-07 DIAGNOSIS — F603 Borderline personality disorder: Secondary | ICD-10-CM | POA: Diagnosis not present

## 2023-02-15 ENCOUNTER — Encounter: Payer: Self-pay | Admitting: Physician Assistant

## 2023-02-15 DIAGNOSIS — F411 Generalized anxiety disorder: Secondary | ICD-10-CM

## 2023-02-15 DIAGNOSIS — F3162 Bipolar disorder, current episode mixed, moderate: Secondary | ICD-10-CM

## 2023-02-15 DIAGNOSIS — R5383 Other fatigue: Secondary | ICD-10-CM

## 2023-02-15 DIAGNOSIS — Z79899 Other long term (current) drug therapy: Secondary | ICD-10-CM

## 2023-02-15 NOTE — Telephone Encounter (Signed)
Please see message and advise 

## 2023-02-20 DIAGNOSIS — E559 Vitamin D deficiency, unspecified: Secondary | ICD-10-CM | POA: Diagnosis not present

## 2023-02-20 DIAGNOSIS — Z6826 Body mass index (BMI) 26.0-26.9, adult: Secondary | ICD-10-CM | POA: Diagnosis not present

## 2023-02-21 DIAGNOSIS — F603 Borderline personality disorder: Secondary | ICD-10-CM | POA: Diagnosis not present

## 2023-02-21 DIAGNOSIS — F3162 Bipolar disorder, current episode mixed, moderate: Secondary | ICD-10-CM | POA: Diagnosis not present

## 2023-02-21 DIAGNOSIS — F4312 Post-traumatic stress disorder, chronic: Secondary | ICD-10-CM | POA: Diagnosis not present

## 2023-02-22 ENCOUNTER — Other Ambulatory Visit: Payer: Medicare Other

## 2023-02-26 DIAGNOSIS — F3162 Bipolar disorder, current episode mixed, moderate: Secondary | ICD-10-CM | POA: Diagnosis not present

## 2023-02-26 DIAGNOSIS — F603 Borderline personality disorder: Secondary | ICD-10-CM | POA: Diagnosis not present

## 2023-02-26 DIAGNOSIS — F4312 Post-traumatic stress disorder, chronic: Secondary | ICD-10-CM | POA: Diagnosis not present

## 2023-02-28 ENCOUNTER — Ambulatory Visit (INDEPENDENT_AMBULATORY_CARE_PROVIDER_SITE_OTHER): Payer: Medicare Other | Admitting: Physician Assistant

## 2023-02-28 ENCOUNTER — Encounter: Payer: Self-pay | Admitting: Physician Assistant

## 2023-02-28 VITALS — BP 118/76 | HR 78 | Ht 66.0 in | Wt 166.0 lb

## 2023-02-28 DIAGNOSIS — E559 Vitamin D deficiency, unspecified: Secondary | ICD-10-CM | POA: Diagnosis not present

## 2023-02-28 DIAGNOSIS — Z79899 Other long term (current) drug therapy: Secondary | ICD-10-CM | POA: Diagnosis not present

## 2023-02-28 DIAGNOSIS — F411 Generalized anxiety disorder: Secondary | ICD-10-CM | POA: Diagnosis not present

## 2023-02-28 DIAGNOSIS — E785 Hyperlipidemia, unspecified: Secondary | ICD-10-CM | POA: Diagnosis not present

## 2023-02-28 DIAGNOSIS — E663 Overweight: Secondary | ICD-10-CM | POA: Diagnosis not present

## 2023-02-28 DIAGNOSIS — F3162 Bipolar disorder, current episode mixed, moderate: Secondary | ICD-10-CM | POA: Diagnosis not present

## 2023-02-28 DIAGNOSIS — F329 Major depressive disorder, single episode, unspecified: Secondary | ICD-10-CM

## 2023-02-28 DIAGNOSIS — K580 Irritable bowel syndrome with diarrhea: Secondary | ICD-10-CM | POA: Diagnosis not present

## 2023-02-28 LAB — TSH: TSH: 1.09 u[IU]/mL (ref 0.35–5.50)

## 2023-02-28 NOTE — Progress Notes (Signed)
Kathy Zimmerman is a 32 y.o. female here for an office visit.  History of Present Illness:   Chief Complaint  Patient presents with   Annual Exam    HPI  Anxiety, Depression, Bipolar Disorder Stable with venlafaxine 225 mg, Remeron 15 mg daily, gabapentin 1800 mg daily, lithium 300 mg daily. These are helping symptoms without negative side effects. Denies suicidal ideation/hi  IBS w diarrhea Treated with loperamide 2 mg four times daily as needed. Having frequent diarrhea but does not affect daily life. No rectal bleeding.  Overweight Believes she needs to lose more weight. Has gained 10 pounds between 11/01/22 and today. Goes to [facility] for weight loss counseling. Was doing well with [employer] but then lost coverage - would like to resume  Hyperlipidemia CHOL elevated at 206, TRIG at 196, LDL at 128. Currently not taking medication.  Taking oral Vitamin D. Vaccine counseling: UTD on flu, tetanus vaccines. PAP smear last completed 10/27/20. Negative for intraepithelial lesion or malignancy. Recommended repeat in 2025. UTD on dentist, eye exam. Seen by dermatologist regularly.  Past Medical History:  Diagnosis Date   Anxiety    Asthma    Depression    Headache    IBS (irritable bowel syndrome)    Panic attack    Preeclampsia      Social History   Tobacco Use   Smoking status: Never    Passive exposure: Never   Smokeless tobacco: Never  Vaping Use   Vaping Use: Never used  Substance Use Topics   Alcohol use: No   Drug use: No    Past Surgical History:  Procedure Laterality Date   CESAREAN SECTION N/A 04/10/2015   Procedure: CESAREAN SECTION;  Surgeon: Jaymes Graff, MD;  Location: [facility];  Service: Obstetrics;  Laterality: N/A;   EYE SURGERY     laser   SCLERAL BUCKLE  08/09/2011   Procedure: SCLERAL BUCKLE;  Surgeon: Alford Highland Rankin;  Location: [facility];  Service: Ophthalmology;  Laterality: Left;  with cryo   TENDON REPAIR     R middle finger     Family History  Problem Relation Age of Onset   Allergic rhinitis Mother    Eczema Father    Asthma Father    Allergic rhinitis Father    Depression Father    Depression Sister    Mental illness Sister    Depression Maternal Grandmother    Mental illness Maternal Grandmother    Thyroid disease Maternal Grandmother    Heart disease Maternal Grandfather    Hypertension Maternal Grandfather    Hyperlipidemia Maternal Grandfather    CAD Maternal Grandfather    Thyroid disease Other    CAD Other    Cancer Neg Hx     Allergies  Allergen Reactions   Cat Hair Extract Anaphylaxis   Shrimp [Shellfish Allergy] Anaphylaxis and Swelling   Sulfasalazine Rash   Sulfa Antibiotics Rash    Current Medications:   Current Outpatient Medications:    budesonide (PULMICORT) 1 MG/2ML nebulizer solution, Take 2 mLs (1 mg total) by nebulization in the morning and at bedtime., Disp: 60 mL, Rfl: 0   busPIRone (BUSPAR) 10 MG tablet, Take 10 mg by mouth in the morning and at bedtime., Disp: , Rfl:    chlorproMAZINE (THORAZINE) 100 MG tablet, Take 100 mg by mouth at bedtime., Disp: , Rfl:    diphenhydrAMINE (BENADRYL) 50 MG tablet, Take 50 mg by mouth at bedtime as needed., Disp: , Rfl:    EPINEPHrine  0.3 mg/0.3 mL IJ SOAJ injection, Inject 0.3 mg into the muscle as needed for anaphylaxis., Disp: 2 each, Rfl: 1   gabapentin (NEURONTIN) 600 MG tablet, Take 1,800 mg by mouth daily., Disp: , Rfl:    lithium 600 MG capsule, Take 600 mg by mouth daily., Disp: , Rfl:    lithium carbonate 300 MG capsule, Take 300 mg by mouth daily., Disp: , Rfl:    loperamide (IMODIUM A-D) 2 MG tablet, Take 1 tablet (2 mg total) by mouth 4 (four) times daily as needed for diarrhea or loose stools., Disp: 30 tablet, Rfl: 0   medroxyPROGESTERone (DEPO-PROVERA) 150 MG/ML injection, Inject 1 mL (150 mg total) into the muscle every 3 (three) months., Disp: 1 mL, Rfl: 0   megestrol (MEGACE) 40 MG tablet, TAKE 3 TABS X 5 DAYS, 2  TABS X 5 DAYS, 1 TAB DAILY TO CONTROL VAG BLEEDING, STOP WHEN BLEEDING STOPS,, Disp: 45 tablet, Rfl: 0   ondansetron (ZOFRAN) 4 MG tablet, Take 1 tablet (4 mg total) by mouth every 8 (eight) hours as needed for nausea or vomiting., Disp: 30 tablet, Rfl: 0   tretinoin (RETIN-A) 0.025 % cream, Apply 1 Application topically at bedtime., Disp: , Rfl:    venlafaxine XR (EFFEXOR-XR) 150 MG 24 hr capsule, Take 150 mg by mouth daily., Disp: , Rfl:    venlafaxine XR (EFFEXOR-XR) 75 MG 24 hr capsule, Take by mouth., Disp: , Rfl:    Review of Systems:   Review of Systems  Constitutional:  Negative for fever and malaise/fatigue.  HENT:  Negative for congestion.   Eyes:  Negative for blurred vision.  Respiratory:  Negative for cough and shortness of breath.   Cardiovascular:  Negative for chest pain, palpitations and leg swelling.  Gastrointestinal:  Positive for diarrhea. Negative for vomiting.  Musculoskeletal:  Negative for back pain.  Skin:  Negative for rash.  Neurological:  Negative for loss of consciousness and headaches.    Vitals:   Vitals:   02/28/23 1405  BP: 118/76  Pulse: 78  SpO2: 98%  Weight: 166 lb (75.3 kg)  Height: 5\' 6"  (1.676 m)     Body mass index is 26.79 kg/m.  Physical Exam:   Physical Exam Vitals and nursing note reviewed.  Constitutional:      General: She is not in acute distress.    Appearance: She is well-developed. She is not ill-appearing or toxic-appearing.  Cardiovascular:     Rate and Rhythm: Normal rate and regular rhythm.     Pulses: Normal pulses.     Heart sounds: Normal heart sounds, S1 normal and S2 normal.  Pulmonary:     Effort: Pulmonary effort is normal.     Breath sounds: Normal breath sounds.  Skin:    General: Skin is warm and dry.  Neurological:     Mental Status: She is alert.     GCS: GCS eye subscore is 4. GCS verbal subscore is 5. GCS motor subscore is 6.  Psychiatric:        Speech: Speech normal.        Behavior: Behavior  normal. Behavior is cooperative.     Assessment and Plan:   Reactive depression; GAD (generalized anxiety disorder);Mixed bipolar affective disorder, moderate (HCC) Reviewed Overall stable per patient report Continue management with psychiatry  Vitamin D deficiency Continue supplementation  High risk medication use Update lithium level per her request She will have her psychiatry manage results  Irritable bowel syndrome with diarrhea Consider reduced or  no-dairy diet Consider gastroenterology referral  Overweight Does not meet criteria for GLP-1 Work on diet and exercise as able Follow-up with [place] or [facility]  HLD Continue to work on healthy lifestyle No need to statin at this time  [employer] as a Neurosurgeon for [employer], PA.,have documented all relevant documentation on the behalf of [name], PA,as directed by  [name], PA while in the presence of [name], [place].   I, [name], [place], have reviewed all documentation for this visit. The documentation on 02/28/23 for the exam, diagnosis, procedures, and orders are all accurate and complete.   [name], PA-C

## 2023-02-28 NOTE — Patient Instructions (Addendum)
It was great to see you!  Send me a MyChart message if you have any weight gain concerns  Keep working hard! You are doing a good job no matter what the scale says!  Take care,  [name] PA-C

## 2023-03-01 LAB — LITHIUM LEVEL: Lithium Lvl: 0.7 mmol/L (ref 0.6–1.2)

## 2023-03-07 ENCOUNTER — Other Ambulatory Visit: Payer: Self-pay | Admitting: Physician Assistant

## 2023-03-07 ENCOUNTER — Telehealth (INDEPENDENT_AMBULATORY_CARE_PROVIDER_SITE_OTHER): Payer: Medicare Other | Admitting: Physician Assistant

## 2023-03-07 ENCOUNTER — Encounter: Payer: Self-pay | Admitting: Physician Assistant

## 2023-03-07 VITALS — Ht 66.0 in | Wt 170.0 lb

## 2023-03-07 DIAGNOSIS — R0981 Nasal congestion: Secondary | ICD-10-CM | POA: Diagnosis not present

## 2023-03-07 DIAGNOSIS — E663 Overweight: Secondary | ICD-10-CM | POA: Diagnosis not present

## 2023-03-07 DIAGNOSIS — F603 Borderline personality disorder: Secondary | ICD-10-CM | POA: Diagnosis not present

## 2023-03-07 DIAGNOSIS — F3162 Bipolar disorder, current episode mixed, moderate: Secondary | ICD-10-CM | POA: Diagnosis not present

## 2023-03-07 DIAGNOSIS — E785 Hyperlipidemia, unspecified: Secondary | ICD-10-CM | POA: Diagnosis not present

## 2023-03-07 DIAGNOSIS — F4312 Post-traumatic stress disorder, chronic: Secondary | ICD-10-CM | POA: Diagnosis not present

## 2023-03-07 MED ORDER — AMOXICILLIN-POT CLAVULANATE 875-125 MG PO TABS: 1.0000 | ORAL_TABLET | Freq: Two times a day (BID) | ORAL | 0 refills | Status: DC

## 2023-03-07 MED ORDER — FEXOFENADINE HCL 180 MG PO TABS: 180.0000 mg | ORAL_TABLET | Freq: Every day | ORAL | 1 refills | Status: DC

## 2023-03-07 MED ORDER — ZEPBOUND 2.5 MG/0.5ML ~~LOC~~ SOAJ: 2.5000 mg | SUBCUTANEOUS | 0 refills | Status: DC

## 2023-03-07 NOTE — Progress Notes (Signed)
I acted as a Neurosurgeon for [employer], PA-C Corky Mull, LPN  Virtual Visit via Video Note   I, [name], Pa, connected with  Cayleigh Paull Carneal  ([id number], August 20, 1991) on 03/07/23 at  3:20 PM EDT by a video-enabled telemedicine application and verified that I am speaking with the correct person using two identifiers.  Location: Patient: Home Provider: [facility] [place] office   I discussed the limitations of evaluation and management by telemedicine and the availability of in person appointments. The patient expressed understanding and agreed to proceed.    History of Present Illness: Kathy Zimmerman is a 32 y.o. who identifies as a female who was assigned female at birth, and is being seen today for sinus problem. Pt c/o nasal congestion, facial pain and ear pressure started 2 days ago. Denies fever or chills. Her son was sick with upper respiratory infection (URI) symptom(s) last week but did not have fever or go to the doctor for this issue. She has taken benadryl but no other medications for this.  Overweight/HLD She continues to gain weight She is currently 170 pounds and BMI is 27.4 She is dieting and exercise and would like to re-start Zepbound She did not tolerate Wegovy and cannot take phentermine  Problems:  Patient Active Problem List   Diagnosis Date Noted   Anaphylactic reaction due to food, subsequent encounter 11/15/2022   Other allergic rhinitis 11/15/2022   Mixed bipolar affective disorder, moderate (HCC) 12/11/2020   Asthma 04/19/2020   Depression 04/19/2020   Borderline personality disorder (HCC) 04/19/2020   PTSD (post-traumatic stress disorder) 08/15/2018   GAD (generalized anxiety disorder) 10/25/2017   Recurrent vertigo 07/12/2017   POTS (postural orthostatic tachycardia syndrome) 09/11/2016   Hx of severe preeclampsia 03/20/2016   Anxiety 04/11/2015   Retinal detachment of left eye with retinal break 08/08/2011    Class: Acute     Allergies:  Allergies  Allergen Reactions   Cat Hair Extract Anaphylaxis   Shrimp [Shellfish Allergy] Anaphylaxis and Swelling   Sulfasalazine Rash   Sulfa Antibiotics Rash   Medications:  Current Outpatient Medications:    amoxicillin-clavulanate (AUGMENTIN) 875-125 MG tablet, Take 1 tablet by mouth 2 (two) times daily., Disp: 14 tablet, Rfl: 0   budesonide (PULMICORT) 1 MG/2ML nebulizer solution, Take 2 mLs (1 mg total) by nebulization in the morning and at bedtime., Disp: 60 mL, Rfl: 0   busPIRone (BUSPAR) 10 MG tablet, Take 10 mg by mouth in the morning and at bedtime., Disp: , Rfl:    diphenhydrAMINE (BENADRYL) 50 MG tablet, Take 50 mg by mouth at bedtime as needed., Disp: , Rfl:    EPINEPHrine 0.3 mg/0.3 mL IJ SOAJ injection, Inject 0.3 mg into the muscle as needed for anaphylaxis., Disp: 2 each, Rfl: 1   fexofenadine (ALLEGRA) 180 MG tablet, Take 1 tablet (180 mg total) by mouth daily., Disp: 90 tablet, Rfl: 1   gabapentin (NEURONTIN) 600 MG tablet, Take 1,800 mg by mouth daily., Disp: , Rfl:    lithium carbonate 300 MG capsule, Take 300 mg by mouth daily., Disp: , Rfl:    loperamide (IMODIUM A-D) 2 MG tablet, Take 1 tablet (2 mg total) by mouth 4 (four) times daily as needed for diarrhea or loose stools., Disp: 30 tablet, Rfl: 0   medroxyPROGESTERone (DEPO-PROVERA) 150 MG/ML injection, Inject 1 mL (150 mg total) into the muscle every 3 (three) months., Disp: 1 mL, Rfl: 0   megestrol (MEGACE) 40 MG tablet, TAKE 3 TABS  X 5 DAYS, 2 TABS X 5 DAYS, 1 TAB DAILY TO CONTROL VAG BLEEDING, STOP WHEN BLEEDING STOPS,, Disp: 45 tablet, Rfl: 0   ondansetron (ZOFRAN) 4 MG tablet, Take 1 tablet (4 mg total) by mouth every 8 (eight) hours as needed for nausea or vomiting., Disp: 30 tablet, Rfl: 0   tirzepatide (ZEPBOUND) 2.5 MG/0.5ML Pen, Inject 2.5 mg into the skin once a week., Disp: 2 mL, Rfl: 0   topiramate (TOPAMAX) 100 MG tablet, Take 1 tablet by mouth daily., Disp: , Rfl:    tretinoin  (RETIN-A) 0.025 % cream, Apply 1 Application topically at bedtime., Disp: , Rfl:    venlafaxine XR (EFFEXOR-XR) 150 MG 24 hr capsule, Take 150 mg by mouth daily., Disp: , Rfl:    venlafaxine XR (EFFEXOR-XR) 75 MG 24 hr capsule, Take by mouth., Disp: , Rfl:   Observations/Objective: Patient is well-developed, well-nourished in no acute distress.  Resting comfortably  at home.  Head is normocephalic, atraumatic.  No labored breathing.  Speech is clear and coherent with logical content.  Patient is alert and oriented at baseline.    Assessment and Plan: 1. Sinus congestion No red flags on discussion, patient is not in any obvious distress during our visit. Discussed progression of most viral illness, and recommended supportive care at this point in time. I did however provide pocket rx for oral augmentin should symptoms not improve as anticipated. Discussed over the counter supportive care options, with recommendations to push fluids and rest. Reviewed return precautions including new/worsening fever, SOB, new/worsening cough or other concerns.  Recommended need to self-quarantine and practice social distancing until symptoms resolve. Discussed current recommendations for COVID testing. I recommend that patient follow-up if symptoms worsen or persist despite treatment x 7-10 days, sooner if needed.  2. Overweight; 3. Hyperlipidemia, unspecified hyperlipidemia type Continues to gain weight despite efforts with diet and exercise Will send in Zepbound 2.5 mg weekly Follow-up in 1-3 month(s), sooner if concerns  Follow Up Instructions: I discussed the assessment and treatment plan with the patient. The patient was provided an opportunity to ask questions and all were answered. The patient agreed with the plan and demonstrated an understanding of the instructions.  A copy of instructions were sent to the patient via MyChart unless otherwise noted below.   The patient was advised to call back  or seek an in-person evaluation if the symptoms worsen or if the condition fails to improve as anticipated.  [name], [place]

## 2023-03-12 DIAGNOSIS — E7800 Pure hypercholesterolemia, unspecified: Secondary | ICD-10-CM | POA: Diagnosis not present

## 2023-03-12 DIAGNOSIS — E559 Vitamin D deficiency, unspecified: Secondary | ICD-10-CM | POA: Diagnosis not present

## 2023-03-12 DIAGNOSIS — Z6827 Body mass index (BMI) 27.0-27.9, adult: Secondary | ICD-10-CM | POA: Diagnosis not present

## 2023-03-13 ENCOUNTER — Telehealth: Payer: Self-pay | Admitting: *Deleted

## 2023-03-13 NOTE — Telephone Encounter (Signed)
Please do PA for Zepbound.

## 2023-03-13 NOTE — Telephone Encounter (Signed)
Please do PA for Zepbound. Thanks

## 2023-03-14 ENCOUNTER — Telehealth: Payer: Self-pay

## 2023-03-14 ENCOUNTER — Other Ambulatory Visit (HOSPITAL_COMMUNITY): Payer: Self-pay

## 2023-03-14 ENCOUNTER — Encounter: Payer: Self-pay | Admitting: Physician Assistant

## 2023-03-14 NOTE — Telephone Encounter (Signed)
Pharmacy Patient Advocate Encounter   Received notification that prior authorization for Zepbound is required/requested.   PA submitted to [facility] via CoverMyMeds Key  # [id number] Status is: You requested a drug that is only supported to treat weight loss alone. The Medicare rule in the Prescription Drug Manual (Chapter 6, Section 20.1) says drugs used to help you lose weight are excluded from Medicare Part D coverage. [employer] follows Medicare rules. If the drug is being used for weight loss, per Medicare rules it cannot be covered.

## 2023-03-14 NOTE — Telephone Encounter (Signed)
Message sent to pt via My Chart

## 2023-03-14 NOTE — Telephone Encounter (Signed)
Please see messages and advise. 

## 2023-03-18 ENCOUNTER — Other Ambulatory Visit: Payer: Self-pay | Admitting: Physician Assistant

## 2023-03-19 MED ORDER — ONDANSETRON HCL 4 MG PO TABS: 4.0000 mg | ORAL_TABLET | Freq: Three times a day (TID) | ORAL | 0 refills | Status: DC | PRN

## 2023-03-21 DIAGNOSIS — R7989 Other specified abnormal findings of blood chemistry: Secondary | ICD-10-CM | POA: Diagnosis not present

## 2023-03-21 DIAGNOSIS — E559 Vitamin D deficiency, unspecified: Secondary | ICD-10-CM | POA: Diagnosis not present

## 2023-03-21 DIAGNOSIS — Z6826 Body mass index (BMI) 26.0-26.9, adult: Secondary | ICD-10-CM | POA: Diagnosis not present

## 2023-03-23 DIAGNOSIS — F3162 Bipolar disorder, current episode mixed, moderate: Secondary | ICD-10-CM | POA: Diagnosis not present

## 2023-03-23 DIAGNOSIS — F4312 Post-traumatic stress disorder, chronic: Secondary | ICD-10-CM | POA: Diagnosis not present

## 2023-03-23 DIAGNOSIS — F603 Borderline personality disorder: Secondary | ICD-10-CM | POA: Diagnosis not present

## 2023-04-03 DIAGNOSIS — E559 Vitamin D deficiency, unspecified: Secondary | ICD-10-CM | POA: Diagnosis not present

## 2023-04-03 DIAGNOSIS — Z6826 Body mass index (BMI) 26.0-26.9, adult: Secondary | ICD-10-CM | POA: Diagnosis not present

## 2023-04-11 ENCOUNTER — Telehealth: Payer: Self-pay | Admitting: Physician Assistant

## 2023-04-11 NOTE — Telephone Encounter (Signed)
Patient scheduled for Nurse Visit 04/17/23. Advised to drink water prior to appointment time.

## 2023-04-11 NOTE — Telephone Encounter (Signed)
Please call pt and schedule Nurse Visit for Depo shot, she is late. She will need urine pregnancy test when she comes in so please tell her drink water.

## 2023-04-11 NOTE — Telephone Encounter (Signed)
Patient requests to be scheduled for a Depo Shot. Please advise.

## 2023-04-17 ENCOUNTER — Encounter: Payer: Self-pay | Admitting: *Deleted

## 2023-04-17 ENCOUNTER — Ambulatory Visit (INDEPENDENT_AMBULATORY_CARE_PROVIDER_SITE_OTHER): Payer: Medicare Other | Admitting: *Deleted

## 2023-04-17 DIAGNOSIS — N912 Amenorrhea, unspecified: Secondary | ICD-10-CM | POA: Diagnosis not present

## 2023-04-17 DIAGNOSIS — Z3042 Encounter for surveillance of injectable contraceptive: Secondary | ICD-10-CM

## 2023-04-17 DIAGNOSIS — Z308 Encounter for other contraceptive management: Secondary | ICD-10-CM

## 2023-04-17 LAB — POCT URINE PREGNANCY: Preg Test, Ur: NEGATIVE

## 2023-04-17 MED ORDER — MEDROXYPROGESTERONE ACETATE 150 MG/ML IM SUSP
150.0000 mg | Freq: Once | INTRAMUSCULAR | Status: AC
Start: 2023-04-17 — End: 2023-04-17
  Administered 2023-04-17: 150 mg via INTRAMUSCULAR

## 2023-04-17 NOTE — Progress Notes (Signed)
Pt arrived for Depo-provera injection, she is late for injection was due 7/3 thru 7/17. POCT pregnancy test done was Negative Per orders of [name], [place], injection of Depo-provera 150 mg given IM by Corky Mull, LPN in right deltoid. Patient tolerated injection well. Patient will make appointment for 3 months between Oct 22 thru Nov 5. Pt verbalized understanding.

## 2023-05-09 DIAGNOSIS — H43391 Other vitreous opacities, right eye: Secondary | ICD-10-CM | POA: Diagnosis not present

## 2023-05-09 DIAGNOSIS — H43813 Vitreous degeneration, bilateral: Secondary | ICD-10-CM | POA: Diagnosis not present

## 2023-05-09 DIAGNOSIS — H59813 Chorioretinal scars after surgery for detachment, bilateral: Secondary | ICD-10-CM | POA: Diagnosis not present

## 2023-05-09 DIAGNOSIS — H43821 Vitreomacular adhesion, right eye: Secondary | ICD-10-CM | POA: Diagnosis not present

## 2023-05-18 DIAGNOSIS — F603 Borderline personality disorder: Secondary | ICD-10-CM | POA: Diagnosis not present

## 2023-05-18 DIAGNOSIS — F3162 Bipolar disorder, current episode mixed, moderate: Secondary | ICD-10-CM | POA: Diagnosis not present

## 2023-05-18 DIAGNOSIS — F4312 Post-traumatic stress disorder, chronic: Secondary | ICD-10-CM | POA: Diagnosis not present

## 2023-05-21 ENCOUNTER — Other Ambulatory Visit: Payer: Self-pay | Admitting: Physician Assistant

## 2023-06-11 DIAGNOSIS — E7800 Pure hypercholesterolemia, unspecified: Secondary | ICD-10-CM | POA: Diagnosis not present

## 2023-06-11 DIAGNOSIS — Z6827 Body mass index (BMI) 27.0-27.9, adult: Secondary | ICD-10-CM | POA: Diagnosis not present

## 2023-06-14 DIAGNOSIS — H59813 Chorioretinal scars after surgery for detachment, bilateral: Secondary | ICD-10-CM | POA: Diagnosis not present

## 2023-06-14 DIAGNOSIS — H43821 Vitreomacular adhesion, right eye: Secondary | ICD-10-CM | POA: Diagnosis not present

## 2023-06-14 DIAGNOSIS — H43391 Other vitreous opacities, right eye: Secondary | ICD-10-CM | POA: Diagnosis not present

## 2023-06-14 DIAGNOSIS — H43813 Vitreous degeneration, bilateral: Secondary | ICD-10-CM | POA: Diagnosis not present

## 2023-06-18 DIAGNOSIS — Z6827 Body mass index (BMI) 27.0-27.9, adult: Secondary | ICD-10-CM | POA: Diagnosis not present

## 2023-06-18 DIAGNOSIS — E559 Vitamin D deficiency, unspecified: Secondary | ICD-10-CM | POA: Diagnosis not present

## 2023-06-25 DIAGNOSIS — Z6827 Body mass index (BMI) 27.0-27.9, adult: Secondary | ICD-10-CM | POA: Diagnosis not present

## 2023-06-25 DIAGNOSIS — E7800 Pure hypercholesterolemia, unspecified: Secondary | ICD-10-CM | POA: Diagnosis not present

## 2023-06-26 ENCOUNTER — Ambulatory Visit
Admission: RE | Admit: 2023-06-26 | Discharge: 2023-06-26 | Disposition: A | Discharge status: Home / Self Care | Payer: Medicare Other | Source: Ambulatory Visit | Attending: Family Medicine | Admitting: Family Medicine

## 2023-06-26 VITALS — BP 107/78 | HR 91 | Temp 99.0°F

## 2023-06-26 DIAGNOSIS — J0300 Acute streptococcal tonsillitis, unspecified: Secondary | ICD-10-CM | POA: Diagnosis not present

## 2023-06-26 DIAGNOSIS — T161XXA Foreign body in right ear, initial encounter: Secondary | ICD-10-CM | POA: Diagnosis not present

## 2023-06-26 LAB — POCT RAPID STREP A (OFFICE): Rapid Strep A Screen: POSITIVE — AB

## 2023-06-26 MED ORDER — AMOXICILLIN 875 MG PO TABS: 875.0000 mg | ORAL_TABLET | Freq: Two times a day (BID) | ORAL | 0 refills | Status: DC

## 2023-06-26 NOTE — ED Triage Notes (Signed)
Pt complains of sore throat, rt ear pain. And body aches x3 days. Taking ibuprofen.

## 2023-06-26 NOTE — ED Provider Notes (Signed)
[facility]    CSN: [id number] Arrival date & time: 06/26/23  1205      History   Chief Complaint Chief Complaint  Patient presents with   Sore Throat    Sore throat, ear pain, body ache - Entered by patient    HPI Kathy Zimmerman is a 32 y.o. female.   Patient presenting today with 3-day history of bodyaches, sore swollen feeling throat, and now right ear pain.  Denies cough, congestion, chest pain, shortness of breath abdominal pain, rashes, vomiting, diarrhea.  So far trying ibuprofen with minimal relief.    Past Medical History:  Diagnosis Date   Anxiety    Asthma    Depression    Headache    IBS (irritable bowel syndrome)    Panic attack    Preeclampsia     Patient Active Problem List   Diagnosis Date Noted   Anaphylactic reaction due to food, subsequent encounter 11/15/2022   Other allergic rhinitis 11/15/2022   Mixed bipolar affective disorder, moderate (HCC) 12/11/2020   Asthma 04/19/2020   Depression 04/19/2020   Borderline personality disorder (HCC) 04/19/2020   PTSD (post-traumatic stress disorder) 08/15/2018   GAD (generalized anxiety disorder) 10/25/2017   Recurrent vertigo 07/12/2017   POTS (postural orthostatic tachycardia syndrome) 09/11/2016   Hx of severe preeclampsia 03/20/2016   Anxiety 04/11/2015   Retinal detachment of left eye with retinal break 08/08/2011    Class: Acute    Past Surgical History:  Procedure Laterality Date   CESAREAN SECTION N/A 04/10/2015   Procedure: CESAREAN SECTION;  Surgeon: Jaymes Graff, MD;  Location: [facility];  Service: Obstetrics;  Laterality: N/A;   EYE SURGERY     laser   SCLERAL BUCKLE  08/09/2011   Procedure: SCLERAL BUCKLE;  Surgeon: Alford Highland Rankin;  Location: [facility];  Service: Ophthalmology;  Laterality: Left;  with cryo   TENDON REPAIR     R middle finger    OB History     Gravida  2   Para  2   Term      Preterm  2   AB      Living  2      SAB      IAB      Ectopic       Multiple  0   Live Births  2            Home Medications    Prior to Admission medications   Medication Sig Start Date End Date Taking? Authorizing Provider  amoxicillin (AMOXIL) 875 MG tablet Take 1 tablet (875 mg total) by mouth 2 (two) times daily. 06/26/23  Yes Particia Nearing, PA-C  budesonide (PULMICORT) 1 MG/2ML nebulizer solution Take 2 mLs (1 mg total) by nebulization in the morning and at bedtime. 09/23/20   Orland Mustard, MD  busPIRone (BUSPAR) 10 MG tablet Take 10 mg by mouth in the morning and at bedtime. 04/13/20   [provider]  diphenhydrAMINE (BENADRYL) 50 MG tablet Take 50 mg by mouth at bedtime as needed.    [provider]  EPINEPHrine 0.3 mg/0.3 mL IJ SOAJ injection Inject 0.3 mg into the muscle as needed for anaphylaxis. 11/15/22   Ellamae Sia, DO  fexofenadine (ALLEGRA) 180 MG tablet TAKE 1 TABLET BY MOUTH EVERY DAY 05/21/23   [name], PA  gabapentin (NEURONTIN) 600 MG tablet Take 1,800 mg by mouth daily. 04/02/20   [provider]  lithium carbonate 300 MG capsule  Take 300 mg by mouth daily. 08/26/21   [provider]  loperamide (IMODIUM A-D) 2 MG tablet Take 1 tablet (2 mg total) by mouth 4 (four) times daily as needed for diarrhea or loose stools. 07/06/21   [name], PA  medroxyPROGESTERone (DEPO-PROVERA) 150 MG/ML injection Inject 1 mL (150 mg total) into the muscle every 3 (three) months. 04/06/22   [name], PA  megestrol (MEGACE) 40 MG tablet TAKE 3 TABS X 5 DAYS, 2 TABS X 5 DAYS, 1 TAB DAILY TO CONTROL VAG BLEEDING, STOP WHEN BLEEDING STOPS, 03/23/22   [name], PA  ondansetron (ZOFRAN) 4 MG tablet Take 1 tablet (4 mg total) by mouth every 8 (eight) hours as needed for nausea or vomiting. 03/19/23   [name], PA  tretinoin (RETIN-A) 0.025 % cream Apply 1 Application topically at bedtime. 10/31/22   [provider]  venlafaxine XR (EFFEXOR-XR) 150 MG 24 hr capsule Take  150 mg by mouth daily. 04/14/20   [provider]  venlafaxine XR (EFFEXOR-XR) 75 MG 24 hr capsule Take by mouth. 08/15/21   [provider]    Family History Family History  Problem Relation Age of Onset   Allergic rhinitis Mother    Eczema Father    Asthma Father    Allergic rhinitis Father    Depression Father    Depression Sister    Mental illness Sister    Depression Maternal Grandmother    Mental illness Maternal Grandmother    Thyroid disease Maternal Grandmother    Heart disease Maternal Grandfather    Hypertension Maternal Grandfather    Hyperlipidemia Maternal Grandfather    CAD Maternal Grandfather    Thyroid disease Other    CAD Other    Cancer Neg Hx     Social History Social History   Tobacco Use   Smoking status: Never    Passive exposure: Never   Smokeless tobacco: Never  Vaping Use   Vaping status: Never Used  Substance Use Topics   Alcohol use: No   Drug use: No     Allergies   Cat hair extract, Shrimp [shellfish allergy], Sulfasalazine, and Sulfa antibiotics   Review of Systems Review of Systems Per HPI  Physical Exam Triage Vital Signs ED Triage Vitals  Encounter Vitals Group     BP 06/26/23 1246 107/78     Systolic BP Percentile --      Diastolic BP Percentile --      Pulse Rate 06/26/23 1246 91     Resp --      Temp 06/26/23 1246 99 F (37.2 C)     Temp Source 06/26/23 1246 Oral     SpO2 06/26/23 1246 91 %     Weight --      Height --      Head Circumference --      Peak Flow --      Pain Score 06/26/23 1247 6     Pain Loc --      Pain Education --      Exclude from Growth Chart --    No data found.  Updated Vital Signs BP 107/78 (BP Location: Right Arm)   Pulse 91   Temp 99 F (37.2 C) (Oral)   SpO2 91%   Visual Acuity Right Eye Distance:   Left Eye Distance:   Bilateral Distance:    Right Eye Near:   Left Eye Near:    Bilateral Near:     Physical Exam Vitals  and nursing note reviewed.   Constitutional:      Appearance: Normal appearance. She is not ill-appearing.  HENT:     Head: Atraumatic.     Ears:     Comments: Silicone earbud piece within right ear canal    Mouth/Throat:     Pharynx: Oropharyngeal exudate and posterior oropharyngeal erythema present.  Eyes:     Extraocular Movements: Extraocular movements intact.     Conjunctiva/sclera: Conjunctivae normal.  Cardiovascular:     Rate and Rhythm: Normal rate and regular rhythm.     Heart sounds: Normal heart sounds.  Pulmonary:     Effort: Pulmonary effort is normal.     Breath sounds: Normal breath sounds.  Musculoskeletal:        General: Normal range of motion.     Cervical back: Normal range of motion and neck supple.  Lymphadenopathy:     Cervical: Cervical adenopathy present.  Skin:    General: Skin is warm and dry.  Neurological:     Mental Status: She is alert and oriented to person, place, and time.  Psychiatric:        Mood and Affect: Mood normal.        Thought Content: Thought content normal.        Judgment: Judgment normal.      UC Treatments / Results  Labs (all labs ordered are listed, but only abnormal results are displayed) Labs Reviewed  POCT RAPID STREP A (OFFICE) - Abnormal; Notable for the following components:      Result Value   Rapid Strep A Screen Positive (*)    All other components within normal limits    EKG   Radiology No results found.  Procedures Procedures (including critical care time)  Medications Ordered in UC Medications - No data to display  Initial Impression / Assessment and Plan / UC Course  I have reviewed the triage vital signs and the nursing notes.  Pertinent labs & imaging results that were available during my care of the patient were reviewed by me and considered in my medical decision making (see chart for details).     Rapid strep positive, treat with Amoxil, supportive over-the-counter medications and home care.  Foreign body  removed from right ear without complication with forceps, TM visualized and benign post removal and patient's symptoms of ear pain and pressure have resolved.  Final Clinical Impressions(s) / UC Diagnoses   Final diagnoses:  Strep tonsillitis  Foreign body of right ear, initial encounter   Discharge Instructions   None    ED Prescriptions     Medication Sig Dispense Auth. Provider   amoxicillin (AMOXIL) 875 MG tablet Take 1 tablet (875 mg total) by mouth 2 (two) times daily. 20 tablet Particia Nearing, [place]      PDMP not reviewed this encounter.   Particia Nearing, [place] 06/26/23 1336

## 2023-07-03 ENCOUNTER — Ambulatory Visit: Payer: Medicare Other

## 2023-07-04 ENCOUNTER — Ambulatory Visit (INDEPENDENT_AMBULATORY_CARE_PROVIDER_SITE_OTHER): Payer: Medicare Other

## 2023-07-04 DIAGNOSIS — Z3042 Encounter for surveillance of injectable contraceptive: Secondary | ICD-10-CM | POA: Diagnosis not present

## 2023-07-04 DIAGNOSIS — Z308 Encounter for other contraceptive management: Secondary | ICD-10-CM

## 2023-07-04 MED ORDER — MEDROXYPROGESTERONE ACETATE 150 MG/ML IM SUSP
150.0000 mg | Freq: Once | INTRAMUSCULAR | Status: AC
Start: 2023-07-04 — End: 2023-07-04
  Administered 2023-07-04: 150 mg via INTRAMUSCULAR

## 2023-07-04 NOTE — Progress Notes (Signed)
Patient is in office today for a nurse visit for  Depo-Provera . Patient Injection was given in the  Right deltoid. Patient tolerated injection well.

## 2023-07-09 ENCOUNTER — Encounter: Payer: Self-pay | Admitting: Physician Assistant

## 2023-07-09 ENCOUNTER — Ambulatory Visit (INDEPENDENT_AMBULATORY_CARE_PROVIDER_SITE_OTHER): Payer: Medicare Other | Admitting: Physician Assistant

## 2023-07-09 VITALS — BP 130/80 | HR 95 | Temp 98.0°F | Ht 66.0 in | Wt 172.4 lb

## 2023-07-09 DIAGNOSIS — J029 Acute pharyngitis, unspecified: Secondary | ICD-10-CM | POA: Diagnosis not present

## 2023-07-09 DIAGNOSIS — R7989 Other specified abnormal findings of blood chemistry: Secondary | ICD-10-CM | POA: Diagnosis not present

## 2023-07-09 DIAGNOSIS — E7800 Pure hypercholesterolemia, unspecified: Secondary | ICD-10-CM | POA: Diagnosis not present

## 2023-07-09 DIAGNOSIS — Z6827 Body mass index (BMI) 27.0-27.9, adult: Secondary | ICD-10-CM | POA: Diagnosis not present

## 2023-07-09 LAB — POCT RAPID STREP A (OFFICE): Rapid Strep A Screen: POSITIVE — AB

## 2023-07-09 MED ORDER — PENICILLIN G BENZATHINE 1200000 UNIT/2ML IM SUSY
1.2000 10*6.[IU] | PREFILLED_SYRINGE | Freq: Once | INTRAMUSCULAR | Status: AC
Start: 2023-07-09 — End: 2023-07-09
  Administered 2023-07-09: 1.2 10*6.[IU] via INTRAMUSCULAR

## 2023-07-09 NOTE — Progress Notes (Signed)
Kathy Zimmerman is a 32 y.o. female here for a new problem.  History of Present Illness:   Chief Complaint  Patient presents with   Sore Throat    Pt c/o sore throat started again Sunday,. Pt was just treated for strep, went away for 3-4 days. Denies fever or chills. Also c/o bilateral ear pain since Sunday.    HPI  Sore throat She currently complains of sore throat.  Pt was previously treated for strep on 10/15 at urgent care Her sx were resolved for 3-4 days before returning on Sunday.  She reports waking up on Sunday with a sore throat and sx have worsened since then.  Has not been seen by ENT. Denies any fever or chills.   She reports having a rubber piece from her Airpod Pros stuck in her ear for 5 days.  Presented to the ED on 10/15 for sore throat and ear pain - had the foreign object removed.  Was prescribed Amoxicillin BID for 10 days, pt has finished this.  She is still experiencing some ear pain currently.    Past Medical History:  Diagnosis Date   Anxiety    Asthma    Depression    Headache    IBS (irritable bowel syndrome)    Panic attack    Preeclampsia      Social History   Tobacco Use   Smoking status: Never    Passive exposure: Never   Smokeless tobacco: Never  Vaping Use   Vaping status: Never Used  Substance Use Topics   Alcohol use: No   Drug use: No    Past Surgical History:  Procedure Laterality Date   CESAREAN SECTION N/A 04/10/2015   Procedure: CESAREAN SECTION;  Surgeon: Jaymes Graff, MD;  Location: [facility];  Service: Obstetrics;  Laterality: N/A;   EYE SURGERY     laser   SCLERAL BUCKLE  08/09/2011   Procedure: SCLERAL BUCKLE;  Surgeon: Alford Highland Rankin;  Location: [facility];  Service: Ophthalmology;  Laterality: Left;  with cryo   TENDON REPAIR     R middle finger    Family History  Problem Relation Age of Onset   Allergic rhinitis Mother    Eczema Father    Asthma Father    Allergic rhinitis Father    Depression Father     Depression Sister    Mental illness Sister    Depression Maternal Grandmother    Mental illness Maternal Grandmother    Thyroid disease Maternal Grandmother    Heart disease Maternal Grandfather    Hypertension Maternal Grandfather    Hyperlipidemia Maternal Grandfather    CAD Maternal Grandfather    Thyroid disease Other    CAD Other    Cancer Neg Hx     Allergies  Allergen Reactions   Cat Hair Extract Anaphylaxis   Shrimp [Shellfish Allergy] Anaphylaxis and Swelling   Sulfasalazine Rash   Sulfa Antibiotics Rash    Current Medications:   Current Outpatient Medications:    budesonide (PULMICORT) 1 MG/2ML nebulizer solution, Take 2 mLs (1 mg total) by nebulization in the morning and at bedtime., Disp: 60 mL, Rfl: 0   busPIRone (BUSPAR) 10 MG tablet, Take 10 mg by mouth in the morning and at bedtime., Disp: , Rfl:    chlorproMAZINE (THORAZINE) 200 MG tablet, Take 200 mg by mouth at bedtime., Disp: , Rfl:    diphenhydrAMINE (BENADRYL) 50 MG tablet, Take 50 mg by mouth at bedtime as needed., Disp: , Rfl:  EPINEPHrine 0.3 mg/0.3 mL IJ SOAJ injection, Inject 0.3 mg into the muscle as needed for anaphylaxis., Disp: 2 each, Rfl: 1   fexofenadine (ALLEGRA) 180 MG tablet, TAKE 1 TABLET BY MOUTH EVERY DAY, Disp: 90 tablet, Rfl: 1   gabapentin (NEURONTIN) 600 MG tablet, Take 1,800 mg by mouth daily., Disp: , Rfl:    lithium carbonate 300 MG capsule, Take 300 mg by mouth daily., Disp: , Rfl:    loperamide (IMODIUM A-D) 2 MG tablet, Take 1 tablet (2 mg total) by mouth 4 (four) times daily as needed for diarrhea or loose stools., Disp: 30 tablet, Rfl: 0   medroxyPROGESTERone (DEPO-PROVERA) 150 MG/ML injection, Inject 1 mL (150 mg total) into the muscle every 3 (three) months., Disp: 1 mL, Rfl: 0   megestrol (MEGACE) 40 MG tablet, TAKE 3 TABS X 5 DAYS, 2 TABS X 5 DAYS, 1 TAB DAILY TO CONTROL VAG BLEEDING, STOP WHEN BLEEDING STOPS,, Disp: 45 tablet, Rfl: 0   ondansetron (ZOFRAN) 4 MG tablet,  Take 1 tablet (4 mg total) by mouth every 8 (eight) hours as needed for nausea or vomiting., Disp: 30 tablet, Rfl: 0   tretinoin (RETIN-A) 0.025 % cream, Apply 1 Application topically at bedtime., Disp: , Rfl:    venlafaxine XR (EFFEXOR-XR) 150 MG 24 hr capsule, Take 150 mg by mouth daily., Disp: , Rfl:    venlafaxine XR (EFFEXOR-XR) 75 MG 24 hr capsule, Take by mouth., Disp: , Rfl:    Review of Systems:   ROS Negative unless otherwise specified per HPI.   Vitals:   Vitals:   07/09/23 1130  BP: 130/80  Pulse: 95  Temp: 98 F (36.7 C)  TempSrc: Temporal  SpO2: 96%  Weight: 172 lb 6.1 oz (78.2 kg)  Height: 5\' 6"  (1.676 m)     Body mass index is 27.82 kg/m.  Physical Exam:   Physical Exam Vitals and nursing note reviewed.  Constitutional:      General: She is not in acute distress.    Appearance: She is well-developed. She is not ill-appearing or toxic-appearing.  HENT:     Head: Normocephalic and atraumatic.     Right Ear: Tympanic membrane, ear canal and external ear normal. Tympanic membrane is not erythematous, retracted or bulging.     Left Ear: Tympanic membrane, ear canal and external ear normal. Tympanic membrane is not erythematous, retracted or bulging.     Nose: Nose normal.     Right Sinus: No maxillary sinus tenderness or frontal sinus tenderness.     Left Sinus: No maxillary sinus tenderness or frontal sinus tenderness.     Mouth/Throat:     Pharynx: Uvula midline. Posterior oropharyngeal erythema present.     Tonsils: Tonsillar exudate present. 2+ on the right. 2+ on the left.  Eyes:     General: Lids are normal.     Conjunctiva/sclera: Conjunctivae normal.  Neck:     Trachea: Trachea normal.  Cardiovascular:     Rate and Rhythm: Normal rate and regular rhythm.     Heart sounds: Normal heart sounds, S1 normal and S2 normal.  Pulmonary:     Effort: Pulmonary effort is normal.     Breath sounds: Normal breath sounds. No decreased breath sounds,  wheezing, rhonchi or rales.  Lymphadenopathy:     Cervical: No cervical adenopathy.  Skin:    General: Skin is warm and dry.  Neurological:     Mental Status: She is alert.  Psychiatric:  Speech: Speech normal.        Behavior: Behavior normal. Behavior is cooperative.    Results for orders placed or performed in visit on 07/09/23  POCT rapid strep A  Result Value Ref Range   Rapid Strep A Screen Positive (A) Negative     Assessment and Plan:   Sore throat No red flags on exam.   Patient was given 1,200,000 units of Bicillin today and was instructed to return in 2 days (Wednesday) for second dose of 1,200,000 units of Bicillin at that time Reviewed return precautions including worsening fever, SOB, worsening cough or other concerns. Push fluids and rest. I recommend that patient follow-up if symptoms worsen or persist despite treatment x 7-10 days, sooner if needed.  I, Isabelle Course, acting as a Neurosurgeon for [name], [place]., have documented all relevant documentation on the behalf of [name], [place], as directed by  [name], PA while in the presence of [name], [place].  I, [name], [place], have reviewed all documentation for this visit. The documentation on 07/09/23 for the exam, diagnosis, procedures, and orders are all accurate and complete.  [name], PA-C

## 2023-07-09 NOTE — Patient Instructions (Signed)
It was great to see you!  Penicillin injection today  Penicillin injection Wednesday  Take care,  [name] PA-C

## 2023-07-11 ENCOUNTER — Ambulatory Visit (INDEPENDENT_AMBULATORY_CARE_PROVIDER_SITE_OTHER): Payer: Medicare Other | Admitting: *Deleted

## 2023-07-11 ENCOUNTER — Encounter: Payer: Self-pay | Admitting: *Deleted

## 2023-07-11 DIAGNOSIS — J029 Acute pharyngitis, unspecified: Secondary | ICD-10-CM

## 2023-07-11 DIAGNOSIS — J020 Streptococcal pharyngitis: Secondary | ICD-10-CM | POA: Diagnosis not present

## 2023-07-11 MED ORDER — PENICILLIN G BENZATHINE 1200000 UNIT/2ML IM SUSY
1.2000 10*6.[IU] | PREFILLED_SYRINGE | Freq: Once | INTRAMUSCULAR | Status: AC
  Administered 2023-07-11: 1.2 10*6.[IU] via INTRAMUSCULAR

## 2023-07-11 NOTE — Progress Notes (Signed)
Per orders of [name], [place], injection of Bicillin LA 1.2 Million Units  given IM by Corky Mull, LPN in Left upper outer quadrant. Patient tolerated injection well.

## 2023-07-13 DIAGNOSIS — F4312 Post-traumatic stress disorder, chronic: Secondary | ICD-10-CM | POA: Diagnosis not present

## 2023-07-13 DIAGNOSIS — F603 Borderline personality disorder: Secondary | ICD-10-CM | POA: Diagnosis not present

## 2023-07-13 DIAGNOSIS — F3162 Bipolar disorder, current episode mixed, moderate: Secondary | ICD-10-CM | POA: Diagnosis not present

## 2023-07-14 ENCOUNTER — Encounter: Payer: Self-pay | Admitting: Physician Assistant

## 2023-07-16 ENCOUNTER — Encounter: Payer: Self-pay | Admitting: Physician Assistant

## 2023-07-16 ENCOUNTER — Ambulatory Visit (INDEPENDENT_AMBULATORY_CARE_PROVIDER_SITE_OTHER): Payer: Medicare Other | Admitting: Physician Assistant

## 2023-07-16 VITALS — BP 100/70 | HR 96 | Temp 98.0°F | Ht 66.0 in | Wt 172.0 lb

## 2023-07-16 DIAGNOSIS — J029 Acute pharyngitis, unspecified: Secondary | ICD-10-CM | POA: Diagnosis not present

## 2023-07-16 MED ORDER — AMOXICILLIN-POT CLAVULANATE 875-125 MG PO TABS: 1.0000 | ORAL_TABLET | Freq: Two times a day (BID) | ORAL | 0 refills | Status: DC

## 2023-07-16 NOTE — Progress Notes (Signed)
Kathy Zimmerman is a 32 y.o. female here for a new problem.  History of Present Illness:   Chief Complaint  Patient presents with   Sore Throat    Pt c/o sore throat started again 2 days ago. Denies fever or chills.    Sore Throat  She continues to complain of a sore throat that returned about 2 days ago. She is also experiencing accompanying body aches. Denies any new exposures since being here. She denies any fevers, chills, or abdominal pains.  She states that her symptoms are improved after the Penicillin injections. She is currently taking 2 ibuprofen at night time to help relief her symptoms.   [facility]ill[facility] on 10/15 and took as prescribed.  Past Medical History:  Diagnosis Date   Anxiety    Asthma    Depression    Headache    IBS (irritable bowel syndrome)    Panic attack    Preeclampsia      Social History   Tobacco Use   Smoking status: Never    Passive exposure: Never   Smokeless tobacco: Never  Vaping Use   Vaping status: Never Used  Substance Use Topics   Alcohol use: No   Drug use: No    Past Surgical History:  Procedure Laterality Date   CESAREAN SECTION N/A 04/10/2015   Procedure: CESAREAN SECTION;  Surgeon: Jaymes Graff, MD;  Location: [facility];  Service: Obstetrics;  Laterality: N/A;   EYE SURGERY     laser   SCLERAL BUCKLE  08/09/2011   Procedure: SCLERAL BUCKLE;  Surgeon: Alford Highland Rankin;  Location: [facility];  Service: Ophthalmology;  Laterality: Left;  with cryo   TENDON REPAIR     R middle finger    Family History  Problem Relation Age of Onset   Allergic rhinitis Mother    Eczema Father    Asthma Father    Allergic rhinitis Father    Depression Father    Depression Sister    Mental illness Sister    Depression Maternal Grandmother    Mental illness Maternal Grandmother    Thyroid disease Maternal Grandmother    Heart disease Maternal Grandfather    Hypertension Maternal Grandfather     Hyperlipidemia Maternal Grandfather    CAD Maternal Grandfather    Thyroid disease Other    CAD Other    Cancer Neg Hx     Allergies  Allergen Reactions   Cat Hair Extract Anaphylaxis   Shrimp [Shellfish Allergy] Anaphylaxis and Swelling   Sulfasalazine Rash   Sulfa Antibiotics Rash    Current Medications:   Current Outpatient Medications:    amoxicillin-clavulanate (AUGMENTIN) 875-125 MG tablet, Take 1 tablet by mouth 2 (two) times daily., Disp: 20 tablet, Rfl: 0   budesonide (PULMICORT) 1 MG/2ML nebulizer solution, Take 2 mLs (1 mg total) by nebulization in the morning and at bedtime., Disp: 60 mL, Rfl: 0   busPIRone (BUSPAR) 10 MG tablet, Take 10 mg by mouth in the morning and at bedtime., Disp: , Rfl:    chlorproMAZINE (THORAZINE) 200 MG tablet, Take 200 mg by mouth at bedtime., Disp: , Rfl:    diphenhydrAMINE (BENADRYL) 50 MG tablet, Take 50 mg by mouth at bedtime as needed., Disp: , Rfl:    EPINEPHrine 0.3 mg/0.3 mL IJ SOAJ injection, Inject 0.3 mg into the muscle as needed for anaphylaxis., Disp: 2 each, Rfl: 1   fexofenadine (ALLEGRA) 180 MG tablet, TAKE 1 TABLET  BY MOUTH EVERY DAY, Disp: 90 tablet, Rfl: 1   gabapentin (NEURONTIN) 600 MG tablet, Take 1,800 mg by mouth daily., Disp: , Rfl:    lithium carbonate 300 MG capsule, Take 300 mg by mouth daily., Disp: , Rfl:    loperamide (IMODIUM A-D) 2 MG tablet, Take 1 tablet (2 mg total) by mouth 4 (four) times daily as needed for diarrhea or loose stools., Disp: 30 tablet, Rfl: 0   medroxyPROGESTERone (DEPO-PROVERA) 150 MG/ML injection, Inject 1 mL (150 mg total) into the muscle every 3 (three) months., Disp: 1 mL, Rfl: 0   megestrol (MEGACE) 40 MG tablet, TAKE 3 TABS X 5 DAYS, 2 TABS X 5 DAYS, 1 TAB DAILY TO CONTROL VAG BLEEDING, STOP WHEN BLEEDING STOPS,, Disp: 45 tablet, Rfl: 0   ondansetron (ZOFRAN) 4 MG tablet, Take 1 tablet (4 mg total) by mouth every 8 (eight) hours as needed for nausea or vomiting., Disp: 30 tablet, Rfl: 0    tretinoin (RETIN-A) 0.025 % cream, Apply 1 Application topically at bedtime., Disp: , Rfl:    venlafaxine XR (EFFEXOR-XR) 150 MG 24 hr capsule, Take 150 mg by mouth daily., Disp: , Rfl:    venlafaxine XR (EFFEXOR-XR) 75 MG 24 hr capsule, Take by mouth., Disp: , Rfl:    Review of Systems:   Review of Systems  Constitutional:  Negative for chills and fever.  HENT:  Positive for sore throat.     Vitals:   Vitals:   07/16/23 1353  BP: 100/70  Pulse: 96  Temp: 98 F (36.7 C)  TempSrc: Temporal  SpO2: 97%  Weight: 172 lb (78 kg)  Height: 5\' 6"  (1.676 m)     Body mass index is 27.76 kg/m.  Physical Exam:   Physical Exam Vitals and nursing note reviewed.  Constitutional:      General: She is not in acute distress.    Appearance: She is well-developed. She is not ill-appearing or toxic-appearing.  HENT:     Mouth/Throat:     Tonsils: Tonsillar exudate present. 2+ on the right. 2+ on the left.  Cardiovascular:     Rate and Rhythm: Normal rate and regular rhythm.     Pulses: Normal pulses.     Heart sounds: Normal heart sounds, S1 normal and S2 normal.  Pulmonary:     Effort: Pulmonary effort is normal.     Breath sounds: Normal breath sounds.  Skin:    General: Skin is warm and dry.  Neurological:     Mental Status: She is alert.     GCS: GCS eye subscore is 4. GCS verbal subscore is 5. GCS motor subscore is 6.  Psychiatric:        Speech: Speech normal.        Behavior: Behavior normal. Behavior is cooperative.     Assessment and Plan:   Sore throat No red flags on exam.  Will initiate augmentin per orders for recurrent tonsillitis. Discussed taking medications as prescribed. Reviewed return precautions including worsening fever, SOB, worsening cough or other concerns. Push fluids and rest. I recommend that patient follow-up if symptoms worsen or persist despite treatment x 7-10 days, sooner if needed.  [name], PA-C  I,Safa M Kadhim,acting as a scribe  for [name], PA.,have documented all relevant documentation on the behalf of [name], PA,as directed by  [name], PA while in the presence of [name], [place].   I, [name], [place], have reviewed all documentation for this visit. The documentation  on 07/16/23 for the exam, diagnosis, procedures, and orders are all accurate and complete.

## 2023-07-19 DIAGNOSIS — F419 Anxiety disorder, unspecified: Secondary | ICD-10-CM | POA: Diagnosis not present

## 2023-07-19 DIAGNOSIS — Z6827 Body mass index (BMI) 27.0-27.9, adult: Secondary | ICD-10-CM | POA: Diagnosis not present

## 2023-07-19 DIAGNOSIS — E7800 Pure hypercholesterolemia, unspecified: Secondary | ICD-10-CM | POA: Diagnosis not present

## 2023-07-26 DIAGNOSIS — E7800 Pure hypercholesterolemia, unspecified: Secondary | ICD-10-CM | POA: Diagnosis not present

## 2023-07-26 DIAGNOSIS — Z6827 Body mass index (BMI) 27.0-27.9, adult: Secondary | ICD-10-CM | POA: Diagnosis not present

## 2023-07-26 DIAGNOSIS — E559 Vitamin D deficiency, unspecified: Secondary | ICD-10-CM | POA: Diagnosis not present

## 2023-08-06 DIAGNOSIS — R432 Parageusia: Secondary | ICD-10-CM | POA: Diagnosis not present

## 2023-08-06 DIAGNOSIS — R52 Pain, unspecified: Secondary | ICD-10-CM | POA: Diagnosis not present

## 2023-08-06 DIAGNOSIS — J029 Acute pharyngitis, unspecified: Secondary | ICD-10-CM | POA: Diagnosis not present

## 2023-08-06 DIAGNOSIS — R059 Cough, unspecified: Secondary | ICD-10-CM | POA: Diagnosis not present

## 2023-08-08 DIAGNOSIS — Z6826 Body mass index (BMI) 26.0-26.9, adult: Secondary | ICD-10-CM | POA: Diagnosis not present

## 2023-08-08 DIAGNOSIS — E559 Vitamin D deficiency, unspecified: Secondary | ICD-10-CM | POA: Diagnosis not present

## 2023-08-10 DIAGNOSIS — F603 Borderline personality disorder: Secondary | ICD-10-CM | POA: Diagnosis not present

## 2023-08-10 DIAGNOSIS — F4312 Post-traumatic stress disorder, chronic: Secondary | ICD-10-CM | POA: Diagnosis not present

## 2023-08-10 DIAGNOSIS — F3162 Bipolar disorder, current episode mixed, moderate: Secondary | ICD-10-CM | POA: Diagnosis not present

## 2023-08-14 NOTE — Progress Notes (Signed)
Kathy Zimmerman is a 32 y.o. female here for a new problem.  History of Present Illness:   No chief complaint on file.   HPI  Has been experiencing sore throat since    Past Medical History:  Diagnosis Date   Anxiety    Asthma    Depression    Headache    IBS (irritable bowel syndrome)    Panic attack    Preeclampsia      Social History   Tobacco Use   Smoking status: Never    Passive exposure: Never   Smokeless tobacco: Never  Vaping Use   Vaping status: Never Used  Substance Use Topics   Alcohol use: No   Drug use: No    Past Surgical History:  Procedure Laterality Date   CESAREAN SECTION N/A 04/10/2015   Procedure: CESAREAN SECTION;  Surgeon: Jaymes Graff, MD;  Location: [facility];  Service: Obstetrics;  Laterality: N/A;   EYE SURGERY     laser   SCLERAL BUCKLE  08/09/2011   Procedure: SCLERAL BUCKLE;  Surgeon: Alford Highland Rankin;  Location: [facility];  Service: Ophthalmology;  Laterality: Left;  with cryo   TENDON REPAIR     R middle finger    Family History  Problem Relation Age of Onset   Allergic rhinitis Mother    Eczema Father    Asthma Father    Allergic rhinitis Father    Depression Father    Depression Sister    Mental illness Sister    Depression Maternal Grandmother    Mental illness Maternal Grandmother    Thyroid disease Maternal Grandmother    Heart disease Maternal Grandfather    Hypertension Maternal Grandfather    Hyperlipidemia Maternal Grandfather    CAD Maternal Grandfather    Thyroid disease Other    CAD Other    Cancer Neg Hx     Allergies  Allergen Reactions   Cat Hair Extract Anaphylaxis   Shrimp [Shellfish Allergy] Anaphylaxis and Swelling   Sulfasalazine Rash   Sulfa Antibiotics Rash    Current Medications:   Current Outpatient Medications:    amoxicillin-clavulanate (AUGMENTIN) 875-125 MG tablet, Take 1 tablet by mouth 2 (two) times daily., Disp: 20 tablet, Rfl: 0   budesonide (PULMICORT) 1 MG/2ML nebulizer solution,  Take 2 mLs (1 mg total) by nebulization in the morning and at bedtime., Disp: 60 mL, Rfl: 0   busPIRone (BUSPAR) 10 MG tablet, Take 10 mg by mouth in the morning and at bedtime., Disp: , Rfl:    chlorproMAZINE (THORAZINE) 200 MG tablet, Take 200 mg by mouth at bedtime., Disp: , Rfl:    diphenhydrAMINE (BENADRYL) 50 MG tablet, Take 50 mg by mouth at bedtime as needed., Disp: , Rfl:    EPINEPHrine 0.3 mg/0.3 mL IJ SOAJ injection, Inject 0.3 mg into the muscle as needed for anaphylaxis., Disp: 2 each, Rfl: 1   fexofenadine (ALLEGRA) 180 MG tablet, TAKE 1 TABLET BY MOUTH EVERY DAY, Disp: 90 tablet, Rfl: 1   gabapentin (NEURONTIN) 600 MG tablet, Take 1,800 mg by mouth daily., Disp: , Rfl:    lithium carbonate 300 MG capsule, Take 300 mg by mouth daily., Disp: , Rfl:    loperamide (IMODIUM A-D) 2 MG tablet, Take 1 tablet (2 mg total) by mouth 4 (four) times daily as needed for diarrhea or loose stools., Disp: 30 tablet, Rfl: 0   medroxyPROGESTERone (DEPO-PROVERA) 150 MG/ML injection, Inject 1 mL (150 mg total) into the muscle every 3 (three) months.,  Disp: 1 mL, Rfl: 0   megestrol (MEGACE) 40 MG tablet, TAKE 3 TABS X 5 DAYS, 2 TABS X 5 DAYS, 1 TAB DAILY TO CONTROL VAG BLEEDING, STOP WHEN BLEEDING STOPS,, Disp: 45 tablet, Rfl: 0   ondansetron (ZOFRAN) 4 MG tablet, Take 1 tablet (4 mg total) by mouth every 8 (eight) hours as needed for nausea or vomiting., Disp: 30 tablet, Rfl: 0   tretinoin (RETIN-A) 0.025 % cream, Apply 1 Application topically at bedtime., Disp: , Rfl:    venlafaxine XR (EFFEXOR-XR) 150 MG 24 hr capsule, Take 150 mg by mouth daily., Disp: , Rfl:    venlafaxine XR (EFFEXOR-XR) 75 MG 24 hr capsule, Take by mouth., Disp: , Rfl:    Review of Systems:   ROS  Vitals:   There were no vitals filed for this visit.   There is no height or weight on file to calculate BMI.  Physical Exam:   Physical Exam  Assessment and Plan:   ***   I,Alexander Ruley,acting as a scribe for [name], PA.,have documented all relevant documentation on the behalf of [name], PA,as directed by  [name], PA while in the presence of [name], [place].   ***  [name], PA-C

## 2023-08-15 ENCOUNTER — Encounter: Payer: Self-pay | Admitting: Physician Assistant

## 2023-08-15 ENCOUNTER — Ambulatory Visit: Payer: Medicare Other | Admitting: Physician Assistant

## 2023-08-15 VITALS — BP 122/76 | HR 110 | Temp 97.5°F | Wt 167.6 lb

## 2023-08-15 DIAGNOSIS — E663 Overweight: Secondary | ICD-10-CM

## 2023-08-15 DIAGNOSIS — E782 Mixed hyperlipidemia: Secondary | ICD-10-CM | POA: Diagnosis not present

## 2023-08-15 DIAGNOSIS — K219 Gastro-esophageal reflux disease without esophagitis: Secondary | ICD-10-CM | POA: Diagnosis not present

## 2023-08-15 DIAGNOSIS — J029 Acute pharyngitis, unspecified: Secondary | ICD-10-CM

## 2023-08-15 LAB — POCT RAPID STREP A (OFFICE): Rapid Strep A Screen: NEGATIVE

## 2023-08-15 MED ORDER — WEGOVY 0.25 MG/0.5ML ~~LOC~~ SOAJ: 0.2500 mg | SUBCUTANEOUS | 1 refills | Status: DC

## 2023-08-16 ENCOUNTER — Other Ambulatory Visit: Payer: Self-pay | Admitting: Physician Assistant

## 2023-08-18 DIAGNOSIS — R1084 Generalized abdominal pain: Secondary | ICD-10-CM | POA: Diagnosis not present

## 2023-08-18 DIAGNOSIS — R197 Diarrhea, unspecified: Secondary | ICD-10-CM | POA: Diagnosis not present

## 2023-08-18 DIAGNOSIS — R110 Nausea: Secondary | ICD-10-CM | POA: Diagnosis not present

## 2023-08-22 ENCOUNTER — Encounter: Payer: Self-pay | Admitting: Physician Assistant

## 2023-08-24 ENCOUNTER — Telehealth: Payer: Medicare Other | Admitting: Physician Assistant

## 2023-08-24 ENCOUNTER — Encounter: Payer: Self-pay | Admitting: Physician Assistant

## 2023-08-24 ENCOUNTER — Other Ambulatory Visit: Payer: Self-pay | Admitting: Physician Assistant

## 2023-08-24 DIAGNOSIS — D500 Iron deficiency anemia secondary to blood loss (chronic): Secondary | ICD-10-CM | POA: Diagnosis not present

## 2023-08-24 DIAGNOSIS — Z113 Encounter for screening for infections with a predominantly sexual mode of transmission: Secondary | ICD-10-CM | POA: Diagnosis not present

## 2023-08-24 DIAGNOSIS — Z79899 Other long term (current) drug therapy: Secondary | ICD-10-CM

## 2023-08-24 DIAGNOSIS — J3489 Other specified disorders of nose and nasal sinuses: Secondary | ICD-10-CM | POA: Diagnosis not present

## 2023-08-24 DIAGNOSIS — R6889 Other general symptoms and signs: Secondary | ICD-10-CM | POA: Diagnosis not present

## 2023-08-24 MED ORDER — VALACYCLOVIR HCL 1 G PO TABS: ORAL_TABLET | ORAL | 1 refills | Status: DC

## 2023-08-24 NOTE — Progress Notes (Signed)
Virtual Visit via Video Note   I, [name], connected with  Kathy Zimmerman  ([id number], [birth year]) on 08/24/23 at 11:00 AM EST by a video-enabled telemedicine application and verified that I am speaking with the correct person using two identifiers.  Location: Patient: Home Provider: [facility] [place] [facility] office   I discussed the limitations of evaluation and management by telemedicine and the availability of in person appointments. The patient expressed understanding and agreed to proceed.    History of Present Illness: Kathy Zimmerman is a 32 y.o. who identifies as a female who was assigned female at birth, and is being seen today for a few issues:  Nasal pain A few days ago she woke up and had significant nasal pain She has a bump on the bridge of her nose that is very tender Denies fevers, chills, trauma, upper respiratory infection (URI) symptom(s) Concerned that she may have fallen asleep with her glasses on  Cold intolerance She is constantly cold, cannot get warm Does not feel like she has had this before Would like her blood work checked  High risk medication use She is due for recheck of her lithium level Denies any concerns with compliance with this medication  STD screening She is not sure if she has been tested for HSV and is inquiring if we can test for this today   Problems:  Patient Active Problem List   Diagnosis Date Noted   Anaphylactic reaction due to food, subsequent encounter 11/15/2022   Other allergic rhinitis 11/15/2022   Mixed bipolar affective disorder, moderate (HCC) 12/11/2020   Asthma 04/19/2020   Depression 04/19/2020   Borderline personality disorder (HCC) 04/19/2020   PTSD (post-traumatic stress disorder) 08/15/2018   GAD (generalized anxiety disorder) 10/25/2017   Recurrent vertigo 07/12/2017   POTS (postural orthostatic tachycardia syndrome) 09/11/2016   Hx of severe preeclampsia 03/20/2016   Anxiety 04/11/2015    Retinal detachment of left eye with retinal break 08/08/2011    Class: Acute    Allergies:  Allergies  Allergen Reactions   Cat Hair Extract Anaphylaxis   Shrimp [Shellfish Allergy] Anaphylaxis and Swelling   Sulfasalazine Rash   Sulfa Antibiotics Rash   Medications:  Current Outpatient Medications:    budesonide (PULMICORT) 1 MG/2ML nebulizer solution, Take 2 mLs (1 mg total) by nebulization in the morning and at bedtime., Disp: 60 mL, Rfl: 0   busPIRone (BUSPAR) 10 MG tablet, Take 10 mg by mouth in the morning and at bedtime., Disp: , Rfl:    chlorproMAZINE (THORAZINE) 200 MG tablet, Take 200 mg by mouth at bedtime., Disp: , Rfl:    diphenhydrAMINE (BENADRYL) 50 MG tablet, Take 50 mg by mouth at bedtime as needed., Disp: , Rfl:    EPINEPHrine 0.3 mg/0.3 mL IJ SOAJ injection, Inject 0.3 mg into the muscle as needed for anaphylaxis., Disp: 2 each, Rfl: 1   fexofenadine (ALLEGRA) 180 MG tablet, TAKE 1 TABLET BY MOUTH EVERY DAY, Disp: 90 tablet, Rfl: 1   gabapentin (NEURONTIN) 600 MG tablet, Take 1,800 mg by mouth daily., Disp: , Rfl:    lithium carbonate 300 MG capsule, Take 300 mg by mouth daily., Disp: , Rfl:    loperamide (IMODIUM A-D) 2 MG tablet, Take 1 tablet (2 mg total) by mouth 4 (four) times daily as needed for diarrhea or loose stools., Disp: 30 tablet, Rfl: 0   medroxyPROGESTERone (DEPO-PROVERA) 150 MG/ML injection, Inject 1 mL (150 mg total) into the muscle every 3 (three) months., Disp:  1 mL, Rfl: 0   megestrol (MEGACE) 40 MG tablet, TAKE 3 TABS X 5 DAYS, 2 TABS X 5 DAYS, 1 TAB DAILY TO CONTROL VAG BLEEDING, STOP WHEN BLEEDING STOPS,, Disp: 45 tablet, Rfl: 0   ondansetron (ZOFRAN) 4 MG tablet, TAKE 1 TABLET BY MOUTH EVERY 8 HOURS AS NEEDED FOR NAUSEA AND VOMITING, Disp: 30 tablet, Rfl: 0   tretinoin (RETIN-A) 0.025 % cream, Apply 1 Application topically at bedtime., Disp: , Rfl:    venlafaxine XR (EFFEXOR-XR) 150 MG 24 hr capsule, Take 150 mg by mouth daily., Disp: , Rfl:     venlafaxine XR (EFFEXOR-XR) 75 MG 24 hr capsule, Take by mouth., Disp: , Rfl:    amoxicillin-clavulanate (AUGMENTIN) 875-125 MG tablet, Take 1 tablet by mouth 2 (two) times daily. (Patient not taking: Reported on 08/24/2023), Disp: 20 tablet, Rfl: 0   Semaglutide-Weight Management (WEGOVY) 0.25 MG/0.5ML SOAJ, Inject 0.25 mg into the skin once a week. (Patient not taking: Reported on 08/24/2023), Disp: 2 mL, Rfl: 1  Observations/Objective: Patient is well-developed, well-nourished in no acute distress.  Resting comfortably  at home.  Head is normocephalic, atraumatic.  No labored breathing.  Speech is clear and coherent with logical content.  Patient is alert and oriented at baseline.   Assessment and Plan: 1. Cold intolerance (Primary); iron deficiency anemia  - CBC with Differential/Platelet; Future - Comprehensive metabolic panel; Future - TSH; Future  Update blood work to rule out organic cause of symptom(s) No red flags on discussion May be related to recent extreme cold weather drop Will also assess iron stores as well Follow-up based on results/symptom(s)   2. Nasal pain - CBC with Differential/Platelet; Future - Comprehensive metabolic panel; Future - HSV(herpes simplex vrs) 1+2 ab-IgG; Future  Unclear etiology Question HSV? Will order HSV-1 and 2 Will order valtrex to empirically treat If no change in symptom(s), will order xray vs ENT referral  3. High risk medication use - CBC with Differential/Platelet; Future - Lithium level; Future  4. Screening examination for STD (sexually transmitted disease) - HSV(herpes simplex vrs) 1+2 ab-IgG; Future   Follow Up Instructions: I discussed the assessment and treatment plan with the patient. The patient was provided an opportunity to ask questions and all were answered. The patient agreed with the plan and demonstrated an understanding of the instructions.  A copy of instructions were sent to the patient via MyChart unless  otherwise noted below.   The patient was advised to call back or seek an in-person evaluation if the symptoms worsen or if the condition fails to improve as anticipated.  [name], [place]

## 2023-08-30 ENCOUNTER — Telehealth: Payer: Self-pay

## 2023-08-30 ENCOUNTER — Other Ambulatory Visit (HOSPITAL_COMMUNITY): Payer: Self-pay

## 2023-08-30 NOTE — Telephone Encounter (Signed)
Pharmacy Patient Advocate Encounter   Received notification from RX Request Messages that prior authorization for [facility] is required/requested.   Insurance verification completed.   The patient is insured through [employer] .   Patient has [employer] Medicare Part D. Weight loss medication is excluded from coverage. PA not submitted.

## 2023-08-31 ENCOUNTER — Other Ambulatory Visit (HOSPITAL_COMMUNITY): Payer: Self-pay

## 2023-08-31 NOTE — Telephone Encounter (Signed)
Pharmacy Patient Advocate Encounter   Received notification from RX Request Messages that prior authorization for [facility] is required/requested.   Insurance verification completed.   The patient is insured through [facility] MEDICAID .   Per test claim: PA required; PA submitted to above mentioned insurance via Fax Key/confirmation #/EOC --- Status is pending   Fax: [phone] Phone: [phone]

## 2023-08-31 NOTE — Telephone Encounter (Signed)
Please submit PA through pt's Medicaid.

## 2023-09-03 ENCOUNTER — Encounter: Payer: Self-pay | Admitting: Physician Assistant

## 2023-09-03 DIAGNOSIS — Z6825 Body mass index (BMI) 25.0-25.9, adult: Secondary | ICD-10-CM | POA: Diagnosis not present

## 2023-09-03 DIAGNOSIS — E7800 Pure hypercholesterolemia, unspecified: Secondary | ICD-10-CM | POA: Diagnosis not present

## 2023-09-04 ENCOUNTER — Other Ambulatory Visit: Payer: Medicare Other

## 2023-09-04 NOTE — Telephone Encounter (Signed)
Hello, pt inquiring about PA for [facility]. Is it covered?

## 2023-09-06 ENCOUNTER — Other Ambulatory Visit (HOSPITAL_COMMUNITY): Payer: Self-pay

## 2023-09-07 DIAGNOSIS — F4312 Post-traumatic stress disorder, chronic: Secondary | ICD-10-CM | POA: Diagnosis not present

## 2023-09-07 DIAGNOSIS — F603 Borderline personality disorder: Secondary | ICD-10-CM | POA: Diagnosis not present

## 2023-09-07 DIAGNOSIS — F3162 Bipolar disorder, current episode mixed, moderate: Secondary | ICD-10-CM | POA: Diagnosis not present

## 2023-09-11 ENCOUNTER — Other Ambulatory Visit (HOSPITAL_COMMUNITY): Payer: Self-pay

## 2023-09-11 NOTE — Telephone Encounter (Signed)
Please start PA for rx wegovy

## 2023-09-11 NOTE — Telephone Encounter (Signed)
 Placed a call to [facility] Medicaid to check the status of the PA. Per the recording, they are closed for the holiday.

## 2023-09-11 NOTE — Telephone Encounter (Signed)
Please start PA Rx wegovy  Thanks

## 2023-09-17 ENCOUNTER — Other Ambulatory Visit (HOSPITAL_COMMUNITY): Payer: Self-pay

## 2023-09-25 ENCOUNTER — Other Ambulatory Visit (HOSPITAL_COMMUNITY): Payer: Self-pay

## 2023-09-25 NOTE — Telephone Encounter (Signed)
 Pharmacy Patient Advocate Encounter  Received notification from [facility] MEDICAID that Prior Authorization for Wegovy 0.25mg /0.35ml has been APPROVED from 09/02/23 to 02/24/24   PA #/Case ID/Reference #: [id number]  Call reference# [id number]

## 2023-09-25 NOTE — Telephone Encounter (Signed)
 Kathy Grebe, do we know if Reginal Lutes has been approved or not yet?

## 2023-09-26 ENCOUNTER — Encounter: Payer: Self-pay | Admitting: *Deleted

## 2023-09-26 ENCOUNTER — Other Ambulatory Visit: Payer: Self-pay | Admitting: Physician Assistant

## 2023-09-26 NOTE — Telephone Encounter (Signed)
 Left message on voicemail to call office.

## 2023-09-26 NOTE — Telephone Encounter (Signed)
 Lelon Mast, patients Reginal Lutes has been approved. Pt has been on Semiglutide 1.25 mg for the past 5 months from [facility]. Please advise what dose of Wegovy pt should be started on?

## 2023-09-26 NOTE — Telephone Encounter (Signed)
 Spoke to pt asked her are you something now? Pt said yes, was prescribed from [facility] the highest dose think 25 mg. Asked pt how long have you been on this? Pt said the past 5 months. Told pt I need to know exactly what dose you are on and then I will send message to [facility] to make sure you are on the correct dose for starting [facility]. Pt verbalized understanding and will call me back.

## 2023-09-26 NOTE — Telephone Encounter (Signed)
 Copied from CRM [id number]. Topic: Clinical - Medication Question >> Sep 26, 2023 10:11 AM Denese Killings wrote: Reason for CRM: Patient is returning a call from [place] regarding Wegovy.

## 2023-09-26 NOTE — Telephone Encounter (Signed)
 Pt called back and said she is on Semiglutide 1.25 mg. Told her okay I will let Lelon Mast know and get back to you thru My Chart on dosage of Wegovy to start on. Pt verbalized understanding.

## 2023-09-28 NOTE — Telephone Encounter (Signed)
Called [facility] and spoke to Brent, asked her to explain why Rx for [facility] can not be run thru [place], message was sent from pharmacy on Rx. Ezechielle said with [facility] as her primary and so even though Medicaid approved  it has to be ran through both insurances will not allow them to run through just one. Told her okay, I will let the patient know.

## 2023-09-28 NOTE — Telephone Encounter (Signed)
Spoke to pt told her I had to call the pharmacy about your [facility] Rx. Unfortunately it can not be filled thru your Medicaid due to your Medicare [employer] denied it, has to be ran through both insurances can not just run through [place], so we will not be able to get filled for you. Pt verbalized understanding.  Samantha notified.

## 2023-10-02 ENCOUNTER — Ambulatory Visit: Payer: Medicare Other

## 2023-10-04 ENCOUNTER — Ambulatory Visit: Payer: Medicare Other

## 2023-10-04 DIAGNOSIS — Z308 Encounter for other contraceptive management: Secondary | ICD-10-CM

## 2023-10-04 MED ORDER — MEDROXYPROGESTERONE ACETATE 150 MG/ML IM SUSP
150.0000 mg | Freq: Once | INTRAMUSCULAR | Status: AC
  Administered 2023-10-04: 150 mg via INTRAMUSCULAR

## 2023-10-04 NOTE — Progress Notes (Signed)
Patient is in office today for a nurse visit for  Depo-Provera , per PCP's order. Patient Injection was given in the  Left deltoid. Patient tolerated injection well. Pt declined pregnancy test, states that she has not been sexually active in 2 years. Made Wyvonnia Lora nurse aware, Lupita Leash ok'd pt to have Depo Provera without pregnancy test.

## 2023-10-08 ENCOUNTER — Telehealth: Payer: Self-pay | Admitting: *Deleted

## 2023-10-08 DIAGNOSIS — R6889 Other general symptoms and signs: Secondary | ICD-10-CM

## 2023-10-08 NOTE — Telephone Encounter (Signed)
Copied from CRM [id number]. Topic: Clinical - Request for Lab/Test Order >> Oct 08, 2023  3:14 PM [name] wrote: Reason for CRM: Patient's psychiatrist is asking that patient complete a thyroid panel when she does her other labs. Labs are scheduled for 9:15 tomorrow morning if Kathy Zimmerman, [place] can please order the thyroid panel.

## 2023-10-08 NOTE — Telephone Encounter (Signed)
Please see message.

## 2023-10-08 NOTE — Telephone Encounter (Signed)
TSH is already ordered to be done with labs.

## 2023-10-09 ENCOUNTER — Other Ambulatory Visit: Payer: Medicare Other

## 2023-10-11 ENCOUNTER — Other Ambulatory Visit (INDEPENDENT_AMBULATORY_CARE_PROVIDER_SITE_OTHER): Payer: Medicare Other

## 2023-10-11 DIAGNOSIS — R6889 Other general symptoms and signs: Secondary | ICD-10-CM

## 2023-10-11 DIAGNOSIS — J3489 Other specified disorders of nose and nasal sinuses: Secondary | ICD-10-CM | POA: Diagnosis not present

## 2023-10-11 DIAGNOSIS — Z113 Encounter for screening for infections with a predominantly sexual mode of transmission: Secondary | ICD-10-CM

## 2023-10-11 DIAGNOSIS — Z79899 Other long term (current) drug therapy: Secondary | ICD-10-CM

## 2023-10-11 DIAGNOSIS — D500 Iron deficiency anemia secondary to blood loss (chronic): Secondary | ICD-10-CM | POA: Diagnosis not present

## 2023-10-11 LAB — CBC WITH DIFFERENTIAL/PLATELET
Basophils Absolute: 0 10*3/uL (ref 0.0–0.1)
Basophils Relative: 0.7 % (ref 0.0–3.0)
Eosinophils Absolute: 0.1 10*3/uL (ref 0.0–0.7)
Eosinophils Relative: 2.1 % (ref 0.0–5.0)
HCT: 41.4 % (ref 36.0–46.0)
Hemoglobin: 13.8 g/dL (ref 12.0–15.0)
Lymphocytes Relative: 29.6 % (ref 12.0–46.0)
Lymphs Abs: 1.8 10*3/uL (ref 0.7–4.0)
MCHC: 33.3 g/dL (ref 30.0–36.0)
MCV: 89 fL (ref 78.0–100.0)
Monocytes Absolute: 0.4 10*3/uL (ref 0.1–1.0)
Monocytes Relative: 6.8 % (ref 3.0–12.0)
Neutro Abs: 3.8 10*3/uL (ref 1.4–7.7)
Neutrophils Relative %: 60.8 % (ref 43.0–77.0)
Platelets: 230 10*3/uL (ref 150.0–400.0)
RBC: 4.66 Mil/uL (ref 3.87–5.11)
RDW: 13.9 % (ref 11.5–15.5)
WBC: 6.2 10*3/uL (ref 4.0–10.5)

## 2023-10-11 LAB — IBC + FERRITIN
Ferritin: 94.6 ng/mL (ref 10.0–291.0)
Iron: 106 ug/dL (ref 42–145)
Saturation Ratios: 31.8 % (ref 20.0–50.0)
TIBC: 333.2 ug/dL (ref 250.0–450.0)
Transferrin: 238 mg/dL (ref 212.0–360.0)

## 2023-10-11 LAB — COMPREHENSIVE METABOLIC PANEL
ALT: 18 U/L (ref 0–35)
AST: 18 U/L (ref 0–37)
Albumin: 4.7 g/dL (ref 3.5–5.2)
Alkaline Phosphatase: 64 U/L (ref 39–117)
BUN: 11 mg/dL (ref 6–23)
CO2: 25 meq/L (ref 19–32)
Calcium: 10.1 mg/dL (ref 8.4–10.5)
Chloride: 106 meq/L (ref 96–112)
Creatinine, Ser: 1.01 mg/dL (ref 0.40–1.20)
GFR: 74.09 mL/min (ref >60.00)
Glucose, Bld: 83 mg/dL (ref 70–99)
Potassium: 4 meq/L (ref 3.5–5.1)
Sodium: 138 meq/L (ref 135–145)
Total Bilirubin: 0.4 mg/dL (ref 0.2–1.2)
Total Protein: 8.1 g/dL (ref 6.0–8.3)

## 2023-10-11 LAB — TSH: TSH: 3.06 u[IU]/mL (ref 0.35–5.50)

## 2023-10-12 LAB — LITHIUM LEVEL: Lithium Lvl: 1.3 mmol/L — ABNORMAL HIGH (ref 0.6–1.2)

## 2023-10-12 LAB — HSV(HERPES SIMPLEX VRS) I + II AB-IGG
HSV 1 IGG,TYPE SPECIFIC AB: <0.9 {index}
HSV 2 IGG,TYPE SPECIFIC AB: <0.9 {index}

## 2023-10-14 ENCOUNTER — Encounter: Payer: Self-pay | Admitting: Physician Assistant

## 2023-10-15 DIAGNOSIS — F603 Borderline personality disorder: Secondary | ICD-10-CM | POA: Diagnosis not present

## 2023-10-15 DIAGNOSIS — F4312 Post-traumatic stress disorder, chronic: Secondary | ICD-10-CM | POA: Diagnosis not present

## 2023-10-15 DIAGNOSIS — F3162 Bipolar disorder, current episode mixed, moderate: Secondary | ICD-10-CM | POA: Diagnosis not present

## 2023-10-22 DIAGNOSIS — H52223 Regular astigmatism, bilateral: Secondary | ICD-10-CM | POA: Diagnosis not present

## 2023-10-26 DIAGNOSIS — F3162 Bipolar disorder, current episode mixed, moderate: Secondary | ICD-10-CM | POA: Diagnosis not present

## 2023-10-26 DIAGNOSIS — F4312 Post-traumatic stress disorder, chronic: Secondary | ICD-10-CM | POA: Diagnosis not present

## 2023-10-26 DIAGNOSIS — F603 Borderline personality disorder: Secondary | ICD-10-CM | POA: Diagnosis not present

## 2023-11-01 DIAGNOSIS — F603 Borderline personality disorder: Secondary | ICD-10-CM | POA: Diagnosis not present

## 2023-11-01 DIAGNOSIS — F4312 Post-traumatic stress disorder, chronic: Secondary | ICD-10-CM | POA: Diagnosis not present

## 2023-11-01 DIAGNOSIS — F3162 Bipolar disorder, current episode mixed, moderate: Secondary | ICD-10-CM | POA: Diagnosis not present

## 2023-11-16 DIAGNOSIS — F603 Borderline personality disorder: Secondary | ICD-10-CM | POA: Diagnosis not present

## 2023-11-16 DIAGNOSIS — F3162 Bipolar disorder, current episode mixed, moderate: Secondary | ICD-10-CM | POA: Diagnosis not present

## 2023-11-16 DIAGNOSIS — F4312 Post-traumatic stress disorder, chronic: Secondary | ICD-10-CM | POA: Diagnosis not present

## 2023-11-26 ENCOUNTER — Encounter: Payer: Self-pay | Admitting: Physician Assistant

## 2023-12-11 ENCOUNTER — Ambulatory Visit (INDEPENDENT_AMBULATORY_CARE_PROVIDER_SITE_OTHER): Admitting: Family Medicine

## 2023-12-11 ENCOUNTER — Encounter: Payer: Self-pay | Admitting: Family Medicine

## 2023-12-11 VITALS — BP 116/72 | HR 108 | Temp 98.7°F | Ht 66.0 in | Wt 163.8 lb

## 2023-12-11 DIAGNOSIS — J069 Acute upper respiratory infection, unspecified: Secondary | ICD-10-CM | POA: Diagnosis not present

## 2023-12-11 DIAGNOSIS — E559 Vitamin D deficiency, unspecified: Secondary | ICD-10-CM | POA: Diagnosis not present

## 2023-12-11 DIAGNOSIS — Z6826 Body mass index (BMI) 26.0-26.9, adult: Secondary | ICD-10-CM | POA: Diagnosis not present

## 2023-12-11 DIAGNOSIS — R0981 Nasal congestion: Secondary | ICD-10-CM

## 2023-12-11 LAB — POCT INFLUENZA A/B
Influenza A, POC: NEGATIVE
Influenza B, POC: NEGATIVE

## 2023-12-11 LAB — POCT RAPID STREP A (OFFICE): Rapid Strep A Screen: NEGATIVE

## 2023-12-11 LAB — POC COVID19 BINAXNOW: SARS Coronavirus 2 Ag: NEGATIVE

## 2023-12-11 NOTE — Patient Instructions (Signed)
 Strep, influenza, and Covid testing all negative  Follow up for any persistent or worsening symptoms.

## 2023-12-11 NOTE — Progress Notes (Signed)
 Established Patient Office Visit  Subjective   Patient ID: Kathy Zimmerman, female    DOB: 1991/04/14  Age: 33 y.o. MRN: [id number]  Chief Complaint  Patient presents with   Sore Throat    Right side face and throat and ear pain, runny nose, started 2 days ago    HPI   Kathy Zimmerman is seen as a work in with upper respiratory symptoms which started couple days ago.  She had some sore throat and nasal congestion along with right ear pain.  No cough.  Denies any fever.  Has had some mild bodyaches.  No known sick contacts.  Reported history of asthma.  No wheezing.  Denies any nausea, vomiting, or diarrhea.  Past Medical History:  Diagnosis Date   Anxiety    Asthma    Depression    Headache    IBS (irritable bowel syndrome)    Panic attack    Preeclampsia    Past Surgical History:  Procedure Laterality Date   CESAREAN SECTION N/A 04/10/2015   Procedure: CESAREAN SECTION;  Surgeon: Jaymes Graff, MD;  Location: [facility];  Service: Obstetrics;  Laterality: N/A;   EYE SURGERY     laser   SCLERAL BUCKLE  08/09/2011   Procedure: SCLERAL BUCKLE;  Surgeon: Alford Highland Rankin;  Location: [facility] OR;  Service: Ophthalmology;  Laterality: Left;  with cryo   TENDON REPAIR     R middle finger    reports that she has never smoked. She has never been exposed to tobacco smoke. She has never used smokeless tobacco. She reports that she does not drink alcohol and does not use drugs. family history includes Allergic rhinitis in her father and mother; Asthma in her father; CAD in her maternal grandfather and another family member; Depression in her father, maternal grandmother, and sister; Eczema in her father; Heart disease in her maternal grandfather; Hyperlipidemia in her maternal grandfather; Hypertension in her maternal grandfather; Mental illness in her maternal grandmother and sister; Thyroid disease in her maternal grandmother and another family member. Allergies  Allergen Reactions   Cat Dander  Anaphylaxis   Shrimp [Shellfish Allergy] Anaphylaxis and Swelling   Sulfasalazine Rash   Sulfa Antibiotics Rash    Review of Systems  Constitutional:  Negative for chills and fever.  HENT:  Positive for congestion, ear pain and sore throat.   Respiratory:  Negative for cough.       Objective:     BP 116/72 (BP Location: Right Arm, Cuff Size: Normal)   Pulse (!) 108   Temp 98.7 F (37.1 C) (Oral)   Ht 5\' 6"  (1.676 m)   Wt 163 lb 12.8 oz (74.3 kg)   LMP  (LMP Unknown)   SpO2 99%   BMI 26.44 kg/m  BP Readings from Last 3 Encounters:  12/11/23 116/72  08/15/23 122/76  07/16/23 100/70   Wt Readings from Last 3 Encounters:  12/11/23 163 lb 12.8 oz (74.3 kg)  08/15/23 167 lb 9.6 oz (76 kg)  07/16/23 172 lb (78 kg)      Physical Exam Vitals reviewed.  Constitutional:      General: She is not in acute distress.    Appearance: She is well-developed. She is not ill-appearing.  HENT:     Right Ear: Tympanic membrane normal.     Left Ear: Tympanic membrane normal.     Mouth/Throat:     Mouth: Mucous membranes are moist.     Pharynx: Oropharynx is clear. No oropharyngeal exudate or  posterior oropharyngeal erythema.  Neck:     Comments: Have some mild tender anterior cervical adenopathy bilaterally Musculoskeletal:     Cervical back: Neck supple.  Lymphadenopathy:     Cervical: Cervical adenopathy present.  Neurological:     Mental Status: She is alert.      Results for orders placed or performed in visit on 12/11/23  POC Influenza A/B  Result Value Ref Range   Influenza A, POC Negative Negative   Influenza B, POC Negative Negative  POC COVID-19 BinaxNow  Result Value Ref Range   SARS Coronavirus 2 Ag Negative Negative  POCT rapid strep A  Result Value Ref Range   Rapid Strep A Screen Negative Negative      The ASCVD Risk score (Arnett DK, et al., 2019) failed to calculate for the following reasons:   The 2019 ASCVD risk score is only valid for ages 59 to  72    Assessment & Plan:   Problem List Items Addressed This Visit   None Visit Diagnoses       Congested nose    -  Primary   Relevant Orders   POC Influenza A/B (Completed)   POC COVID-19 BinaxNow (Completed)   POCT rapid strep A (Completed)     33 year old female with 2-day history of URI type symptoms.  Nonfocal exam.  Influenza, strep, and COVID testing all negative.  Patient nontoxic in appearance.  Recommend plenty of fluids and rest and over-the-counter analgesics as needed for sore throat Follow-up for any persistent or worsening symptoms  No follow-ups on file.    Evelena Peat, MD

## 2023-12-27 ENCOUNTER — Ambulatory Visit (INDEPENDENT_AMBULATORY_CARE_PROVIDER_SITE_OTHER): Payer: Medicare Other | Admitting: *Deleted

## 2023-12-27 DIAGNOSIS — Z789 Other specified health status: Secondary | ICD-10-CM

## 2023-12-27 MED ORDER — MEDROXYPROGESTERONE ACETATE 150 MG/ML IM SUSP
150.0000 mg | Freq: Once | INTRAMUSCULAR | Status: AC
  Administered 2023-12-27: 150 mg via INTRAMUSCULAR

## 2023-12-27 NOTE — Progress Notes (Signed)
 Patient present for Depo INJ  Given on Rt deltoid  Patient tolerated well  Advise to schedule her next Depo Inj between Jul 3-17 2025 Verbalized understanding

## 2023-12-28 DIAGNOSIS — F4312 Post-traumatic stress disorder, chronic: Secondary | ICD-10-CM | POA: Diagnosis not present

## 2023-12-28 DIAGNOSIS — F3162 Bipolar disorder, current episode mixed, moderate: Secondary | ICD-10-CM | POA: Diagnosis not present

## 2023-12-28 DIAGNOSIS — F603 Borderline personality disorder: Secondary | ICD-10-CM | POA: Diagnosis not present

## 2024-01-03 ENCOUNTER — Ambulatory Visit: Admitting: Physician Assistant

## 2024-01-15 DIAGNOSIS — Z72820 Sleep deprivation: Secondary | ICD-10-CM | POA: Diagnosis not present

## 2024-01-15 DIAGNOSIS — Z6825 Body mass index (BMI) 25.0-25.9, adult: Secondary | ICD-10-CM | POA: Diagnosis not present

## 2024-01-15 DIAGNOSIS — E559 Vitamin D deficiency, unspecified: Secondary | ICD-10-CM | POA: Diagnosis not present

## 2024-01-15 DIAGNOSIS — E7800 Pure hypercholesterolemia, unspecified: Secondary | ICD-10-CM | POA: Diagnosis not present

## 2024-01-17 ENCOUNTER — Encounter: Payer: Self-pay | Admitting: Physician Assistant

## 2024-01-18 NOTE — Telephone Encounter (Signed)
 Ok to schedule patient appt for exam and possible removal/send off? Or referral?

## 2024-01-22 ENCOUNTER — Encounter: Payer: Self-pay | Admitting: Physician Assistant

## 2024-01-22 ENCOUNTER — Ambulatory Visit (INDEPENDENT_AMBULATORY_CARE_PROVIDER_SITE_OTHER): Admitting: Physician Assistant

## 2024-01-22 VITALS — BP 110/80 | HR 103 | Temp 98.4°F | Ht 66.0 in | Wt 162.2 lb

## 2024-01-22 DIAGNOSIS — L820 Inflamed seborrheic keratosis: Secondary | ICD-10-CM | POA: Diagnosis not present

## 2024-01-22 DIAGNOSIS — D229 Melanocytic nevi, unspecified: Secondary | ICD-10-CM | POA: Diagnosis not present

## 2024-01-22 NOTE — Progress Notes (Signed)
 Kathy Zimmerman is a 33 y.o. female here for a new problem.  History of Present Illness:   Chief Complaint  Patient presents with   Nevus    Pt c/o mole right upper thigh, has changed color and texture.     HPI  Right thigh Mole  Patient complains today of a mole located in her upper right thigh. Mole has persisted over the past several years.  It has however changed color and texture over the past few months.  She denies any accompanying pain or itchiness.  Will skin perform a shave biopsy today.   Past Medical History:  Diagnosis Date   Anxiety    Asthma    Depression    Headache    IBS (irritable bowel syndrome)    Panic attack    Preeclampsia      Social History   Tobacco Use   Smoking status: Never    Passive exposure: Never   Smokeless tobacco: Never  Vaping Use   Vaping status: Never Used  Substance Use Topics   Alcohol use: No   Drug use: No    Past Surgical History:  Procedure Laterality Date   CESAREAN SECTION N/A 04/10/2015   Procedure: CESAREAN SECTION;  Surgeon: Marylu Soda, MD;  Location: [facility];  Service: Obstetrics;  Laterality: N/A;   EYE SURGERY     laser   SCLERAL BUCKLE  08/09/2011   Procedure: SCLERAL BUCKLE;  Surgeon: Alleen Arbour Rankin;  Location: [facility] OR;  Service: Ophthalmology;  Laterality: Left;  with cryo   TENDON REPAIR     R middle finger    Family History  Problem Relation Age of Onset   Allergic rhinitis Mother    Eczema Father    Asthma Father    Allergic rhinitis Father    Depression Father    Depression Sister    Mental illness Sister    Depression Maternal Grandmother    Mental illness Maternal Grandmother    Thyroid  disease Maternal Grandmother    Heart disease Maternal Grandfather    Hypertension Maternal Grandfather    Hyperlipidemia Maternal Grandfather    CAD Maternal Grandfather    Thyroid  disease Other    CAD Other    Cancer Neg Hx     Allergies  Allergen Reactions   Cat Dander Anaphylaxis   Shrimp  [Shellfish Allergy ] Anaphylaxis and Swelling   Sulfasalazine Rash   Sulfa Antibiotics Rash    Current Medications:   Current Outpatient Medications:    amphetamine-dextroamphetamine (ADDERALL XR) 25 MG 24 hr capsule, Take 25 mg by mouth daily., Disp: , Rfl:    budesonide  (PULMICORT ) 1 MG/2ML nebulizer solution, Take 2 mLs (1 mg total) by nebulization in the morning and at bedtime., Disp: 60 mL, Rfl: 0   busPIRone (BUSPAR) 10 MG tablet, Take 10 mg by mouth in the morning and at bedtime., Disp: , Rfl:    chlorproMAZINE (THORAZINE) 200 MG tablet, Take 200 mg by mouth at bedtime., Disp: , Rfl:    diphenhydrAMINE  (BENADRYL ) 50 MG tablet, Take 50 mg by mouth at bedtime as needed., Disp: , Rfl:    doxepin  (SINEQUAN ) 50 MG capsule, Take 50 mg by mouth daily., Disp: , Rfl:    EPINEPHrine  0.3 mg/0.3 mL IJ SOAJ injection, Inject 0.3 mg into the muscle as needed for anaphylaxis., Disp: 2 each, Rfl: 1   fexofenadine  (ALLEGRA ) 180 MG tablet, TAKE 1 TABLET BY MOUTH EVERY DAY, Disp: 90 tablet, Rfl: 1   gabapentin (NEURONTIN) 600  MG tablet, Take 1,800 mg by mouth daily., Disp: , Rfl:    lithium  600 MG capsule, Take 600 mg by mouth at bedtime., Disp: , Rfl:    lithium  carbonate 300 MG capsule, Take 300 mg by mouth daily., Disp: , Rfl:    loperamide  (IMODIUM  A-D) 2 MG tablet, Take 1 tablet (2 mg total) by mouth 4 (four) times daily as needed for diarrhea or loose stools., Disp: 30 tablet, Rfl: 0   medroxyPROGESTERone  (DEPO-PROVERA ) 150 MG/ML injection, Inject 1 mL (150 mg total) into the muscle every 3 (three) months., Disp: 1 mL, Rfl: 0   megestrol  (MEGACE ) 40 MG tablet, TAKE 3 TABS X 5 DAYS, 2 TABS X 5 DAYS, 1 TAB DAILY TO CONTROL VAG BLEEDING, STOP WHEN BLEEDING STOPS,, Disp: 45 tablet, Rfl: 0   ondansetron  (ZOFRAN ) 4 MG tablet, TAKE 1 TABLET BY MOUTH EVERY 8 HOURS AS NEEDED FOR NAUSEA AND VOMITING, Disp: 30 tablet, Rfl: 0   tretinoin (RETIN-A) 0.025 % cream, Apply 1 Application topically at bedtime., Disp: ,  Rfl:    valACYclovir  (VALTREX ) 1000 MG tablet, Take 2 grams twice daily x 1 day for outbreaks as needed, Disp: 40 tablet, Rfl: 1   venlafaxine XR (EFFEXOR-XR) 150 MG 24 hr capsule, Take 150 mg by mouth daily., Disp: , Rfl:    venlafaxine XR (EFFEXOR-XR) 75 MG 24 hr capsule, Take by mouth., Disp: , Rfl:    Review of Systems:   ROS Negative unless otherwise specified per HPI.  Vitals:   Vitals:   01/22/24 0959  Temp: 98.4 F (36.9 C)  TempSrc: Temporal  Weight: 162 lb 4 oz (73.6 kg)  Height: 5\' 6"  (1.676 m)     Body mass index is 26.19 kg/m.  Physical Exam:   Physical Exam Constitutional:      Appearance: Normal appearance. She is well-developed.  HENT:     Head: Normocephalic and atraumatic.  Eyes:     General: Lids are normal.     Extraocular Movements: Extraocular movements intact.     Conjunctiva/sclera: Conjunctivae normal.  Pulmonary:     Effort: Pulmonary effort is normal.  Musculoskeletal:        General: Normal range of motion.     Cervical back: Normal range of motion and neck supple.  Skin:    General: Skin is warm and dry.     Comments: Right anterior thigh with brown nevus with slightly irregular borders  Neurological:     Mental Status: She is alert and oriented to person, place, and time.  Psychiatric:        Attention and Perception: Attention and perception normal.        Mood and Affect: Mood normal.        Behavior: Behavior normal.        Thought Content: Thought content normal.        Judgment: Judgment normal.    Shave biopsy  Meds, vitals, and allergies reviewed.   Indication: suspicious lesion Location: R anterior thigh Size: 1 cm  Informed consent obtained.  Pt aware of risks not limited to but including infection,  bleeding, damage to near by organs.  Prep: etoh/betadine  Anesthesia: 1%lidocaine  with epi, good effect  Shave made with dermablade  Minimal oozing, controlled with silver nitrate  Tolerated well   Assessment  and Plan:   Atypical mole Will send off for pathology Routine postprocedure instructions d/w pt- keep area clean and bandaged, follow up if concerns/spreading erythema/pain.    Alexander Iba,  PA-C  I,Safa M Kadhim,acting as a scribe for Alexander Iba, PA.,have documented all relevant documentation on the behalf of Alexander Iba, PA,as directed by  Alexander Iba, PA while in the presence of Alexander Iba, [place].   I, Alexander Iba, [place], have reviewed all documentation for this visit. The documentation on 01/22/24 for the exam, diagnosis, procedures, and orders are all accurate and complete.

## 2024-01-25 LAB — DERMATOLOGY PATHOLOGY

## 2024-01-28 ENCOUNTER — Ambulatory Visit: Payer: Self-pay | Admitting: Physician Assistant

## 2024-01-29 DIAGNOSIS — R7989 Other specified abnormal findings of blood chemistry: Secondary | ICD-10-CM | POA: Diagnosis not present

## 2024-01-29 DIAGNOSIS — R635 Abnormal weight gain: Secondary | ICD-10-CM | POA: Diagnosis not present

## 2024-01-29 DIAGNOSIS — Z6825 Body mass index (BMI) 25.0-25.9, adult: Secondary | ICD-10-CM | POA: Diagnosis not present

## 2024-01-29 DIAGNOSIS — E559 Vitamin D deficiency, unspecified: Secondary | ICD-10-CM | POA: Diagnosis not present

## 2024-02-05 DIAGNOSIS — F4312 Post-traumatic stress disorder, chronic: Secondary | ICD-10-CM | POA: Diagnosis not present

## 2024-02-05 DIAGNOSIS — F603 Borderline personality disorder: Secondary | ICD-10-CM | POA: Diagnosis not present

## 2024-02-05 DIAGNOSIS — F3162 Bipolar disorder, current episode mixed, moderate: Secondary | ICD-10-CM | POA: Diagnosis not present

## 2024-02-18 DIAGNOSIS — F3162 Bipolar disorder, current episode mixed, moderate: Secondary | ICD-10-CM | POA: Diagnosis not present

## 2024-02-18 DIAGNOSIS — F4312 Post-traumatic stress disorder, chronic: Secondary | ICD-10-CM | POA: Diagnosis not present

## 2024-02-18 DIAGNOSIS — F603 Borderline personality disorder: Secondary | ICD-10-CM | POA: Diagnosis not present

## 2024-02-29 DIAGNOSIS — F4312 Post-traumatic stress disorder, chronic: Secondary | ICD-10-CM | POA: Diagnosis not present

## 2024-02-29 DIAGNOSIS — F3162 Bipolar disorder, current episode mixed, moderate: Secondary | ICD-10-CM | POA: Diagnosis not present

## 2024-02-29 DIAGNOSIS — F603 Borderline personality disorder: Secondary | ICD-10-CM | POA: Diagnosis not present

## 2024-03-18 ENCOUNTER — Encounter: Admitting: Obstetrics and Gynecology

## 2024-03-18 ENCOUNTER — Ambulatory Visit

## 2024-03-19 ENCOUNTER — Encounter: Payer: Self-pay | Admitting: Obstetrics and Gynecology

## 2024-03-19 ENCOUNTER — Encounter: Payer: Self-pay | Admitting: Physician Assistant

## 2024-03-19 ENCOUNTER — Ambulatory Visit (INDEPENDENT_AMBULATORY_CARE_PROVIDER_SITE_OTHER): Admitting: Obstetrics and Gynecology

## 2024-03-19 ENCOUNTER — Ambulatory Visit: Admitting: Physician Assistant

## 2024-03-19 ENCOUNTER — Other Ambulatory Visit (HOSPITAL_COMMUNITY)
Admission: RE | Admit: 2024-03-19 | Discharge: 2024-03-19 | Disposition: A | Discharge status: Home / Self Care | Source: Ambulatory Visit | Attending: Obstetrics and Gynecology | Admitting: Obstetrics and Gynecology

## 2024-03-19 VITALS — BP 122/80 | HR 96 | Temp 98.1°F | Ht 66.0 in | Wt 174.4 lb

## 2024-03-19 VITALS — BP 116/84 | HR 103 | Ht 67.5 in | Wt 176.0 lb

## 2024-03-19 DIAGNOSIS — Z01419 Encounter for gynecological examination (general) (routine) without abnormal findings: Secondary | ICD-10-CM | POA: Diagnosis present

## 2024-03-19 DIAGNOSIS — Z9189 Other specified personal risk factors, not elsewhere classified: Secondary | ICD-10-CM

## 2024-03-19 DIAGNOSIS — Z3009 Encounter for other general counseling and advice on contraception: Secondary | ICD-10-CM

## 2024-03-19 DIAGNOSIS — Z124 Encounter for screening for malignant neoplasm of cervix: Secondary | ICD-10-CM

## 2024-03-19 DIAGNOSIS — Z1151 Encounter for screening for human papillomavirus (HPV): Secondary | ICD-10-CM | POA: Insufficient documentation

## 2024-03-19 DIAGNOSIS — Z308 Encounter for other contraceptive management: Secondary | ICD-10-CM | POA: Diagnosis not present

## 2024-03-19 DIAGNOSIS — E663 Overweight: Secondary | ICD-10-CM

## 2024-03-19 DIAGNOSIS — F329 Major depressive disorder, single episode, unspecified: Secondary | ICD-10-CM | POA: Diagnosis not present

## 2024-03-19 DIAGNOSIS — Z79899 Other long term (current) drug therapy: Secondary | ICD-10-CM | POA: Diagnosis not present

## 2024-03-19 DIAGNOSIS — B009 Herpesviral infection, unspecified: Secondary | ICD-10-CM | POA: Diagnosis not present

## 2024-03-19 DIAGNOSIS — F411 Generalized anxiety disorder: Secondary | ICD-10-CM | POA: Diagnosis not present

## 2024-03-19 DIAGNOSIS — Z1331 Encounter for screening for depression: Secondary | ICD-10-CM | POA: Diagnosis not present

## 2024-03-19 DIAGNOSIS — Z13220 Encounter for screening for lipoid disorders: Secondary | ICD-10-CM

## 2024-03-19 DIAGNOSIS — Z0000 Encounter for general adult medical examination without abnormal findings: Secondary | ICD-10-CM

## 2024-03-19 LAB — CBC WITH DIFFERENTIAL/PLATELET
Basophils Absolute: 0.1 K/uL (ref 0.0–0.1)
Basophils Relative: 0.8 % (ref 0.0–3.0)
Eosinophils Absolute: 0.1 K/uL (ref 0.0–0.7)
Eosinophils Relative: 2 % (ref 0.0–5.0)
HCT: 40.9 % (ref 36.0–46.0)
Hemoglobin: 13.6 g/dL (ref 12.0–15.0)
Lymphocytes Relative: 29.1 % (ref 12.0–46.0)
Lymphs Abs: 1.9 K/uL (ref 0.7–4.0)
MCHC: 33.3 g/dL (ref 30.0–36.0)
MCV: 88.6 fl (ref 78.0–100.0)
Monocytes Absolute: 0.5 K/uL (ref 0.1–1.0)
Monocytes Relative: 7 % (ref 3.0–12.0)
Neutro Abs: 3.9 K/uL (ref 1.4–7.7)
Neutrophils Relative %: 61.1 % (ref 43.0–77.0)
Platelets: 227 K/uL (ref 150.0–400.0)
RBC: 4.61 Mil/uL (ref 3.87–5.11)
RDW: 13.8 % (ref 11.5–15.5)
WBC: 6.4 K/uL (ref 4.0–10.5)

## 2024-03-19 LAB — COMPREHENSIVE METABOLIC PANEL WITH GFR
ALT: 33 U/L (ref 0–35)
AST: 23 U/L (ref 0–37)
Albumin: 4.7 g/dL (ref 3.5–5.2)
Alkaline Phosphatase: 61 U/L (ref 39–117)
BUN: 11 mg/dL (ref 6–23)
CO2: 28 meq/L (ref 19–32)
Calcium: 9.8 mg/dL (ref 8.4–10.5)
Chloride: 105 meq/L (ref 96–112)
Creatinine, Ser: 0.8 mg/dL (ref 0.40–1.20)
GFR: 97.7 mL/min (ref >60.00)
Glucose, Bld: 78 mg/dL (ref 70–99)
Potassium: 4.3 meq/L (ref 3.5–5.1)
Sodium: 138 meq/L (ref 135–145)
Total Bilirubin: 0.4 mg/dL (ref 0.2–1.2)
Total Protein: 7.9 g/dL (ref 6.0–8.3)

## 2024-03-19 LAB — LIPID PANEL
Cholesterol: 248 mg/dL — ABNORMAL HIGH (ref 0–200)
HDL: 51.1 mg/dL (ref >39.00)
LDL Cholesterol: 147 mg/dL — ABNORMAL HIGH (ref 0–99)
NonHDL: 196.76
Total CHOL/HDL Ratio: 5
Triglycerides: 249 mg/dL — ABNORMAL HIGH (ref 0.0–149.0)
VLDL: 49.8 mg/dL — ABNORMAL HIGH (ref 0.0–40.0)

## 2024-03-19 LAB — TSH: TSH: 1.45 u[IU]/mL (ref 0.35–5.50)

## 2024-03-19 LAB — HEMOGLOBIN A1C: Hgb A1c MFr Bld: 5.3 % (ref 4.6–6.5)

## 2024-03-19 MED ORDER — MEDROXYPROGESTERONE ACETATE 150 MG/ML IM SUSP
150.0000 mg | Freq: Once | INTRAMUSCULAR | Status: AC
  Administered 2024-03-19: 150 mg via INTRAMUSCULAR

## 2024-03-19 NOTE — Progress Notes (Unsigned)
 33 y.o. [id number] Legally Separated Caucasian female here for annual exam. Would like to discuss stopping depo and trying different birth control.  She received her Depo Provera  today at [facility].   No menstrual problems prior to starting Depo Provera .  No cycles at all.  She did have some abnormal bleeding about 2 years ago and did short course of Megace .   Has considered tubal ligation.  Would like to avoid general anesthesia.  Got pregnant while using birth control pills.  Hx severe pre-eclampsia and postpartum depression. Children born at 1 and 32 weeks.  7 and 43 yo sons.   She declines future childbearing.   Not currently in an intimate relationship. Declines STD screening.   PCP: Job Lukes, PA  Dr. Shirline - Psychiatry.  No LMP recorded. Patient has had an injection.           Sexually active: No  The current method of family planning is Depo-Provera  injections.    Menopausal hormone therapy:  n/a Exercising: No.   Smoker:  no  OB History  Gravida Para Term Preterm AB Living  2 2  2  2   SAB IAB Ectopic Multiple Live Births     0 2    # Outcome Date GA Lbr Len/2nd Weight Sex Type Anes PTL Lv  2 Preterm 08/29/16 [redacted]w[redacted]d   M CS-LTranv   LIV  1 Preterm 04/10/15 [redacted]w[redacted]d  1 lb 14.7 oz (0.87 kg) M CS-LTranv Spinal N LIV     HEALTH MAINTENANCE: Last 2 paps:  10/27/20 neg, 12/02/14 neg  History of abnormal Pap or positive HPV:  no Mammogram:   n/a Colonoscopy:  n/a Bone Density:  n/a  Result  n/a   Immunization History  Administered Date(s) Administered   DTP 04/06/1992, 06/15/1992, 08/10/1992, 06/13/1993   DTaP 03/24/1997   HIB (PRP-T) 04/06/1992, 06/15/1992, 08/10/1992, 06/13/1993   Hep B, Unspecified 04/06/1992, 06/15/1992, 03/04/1993   Influenza,inj,Quad PF,6+ Mos 06/30/2016, 06/29/2020   MMR 03/04/1993, 03/24/1997   Meningococcal Conjugate 03/15/2010   OPV 04/06/1992, 06/15/1992, 03/04/1993, 03/24/1997   PFIZER(Purple Top)SARS-COV-2  Vaccination 11/25/2019, 12/09/2019   PPD Test 10/28/2018, 01/29/2019   Rho (D) Immune Globulin  08/30/2016   Tdap 04/11/2015, 08/17/2016, 09/07/2018      reports that she has never smoked. She has never been exposed to tobacco smoke. She has never used smokeless tobacco. She reports that she does not drink alcohol and does not use drugs.  Past Medical History:  Diagnosis Date   Anxiety    Asthma    Bipolar disorder (HCC)    Depression    Headache    IBS (irritable bowel syndrome)    Panic attack    Preeclampsia    PTSD (post-traumatic stress disorder)     Past Surgical History:  Procedure Laterality Date   CESAREAN SECTION N/A 04/10/2015   Procedure: CESAREAN SECTION;  Surgeon: Ovid All, MD;  Location: [facility];  Service: Obstetrics;  Laterality: N/A;   EYE SURGERY     laser   SCLERAL BUCKLE  08/09/2011   Procedure: SCLERAL BUCKLE;  Surgeon: Arley LABOR Rankin;  Location: [facility];  Service: Ophthalmology;  Laterality: Left;  with cryo   TENDON REPAIR     R middle finger    Current Outpatient Medications  Medication Sig Dispense Refill   amphetamine-dextroamphetamine (ADDERALL XR) 25 MG 24 hr capsule Take 25 mg by mouth daily.     budesonide  (PULMICORT ) 1 MG/2ML nebulizer solution Take 2 mLs (1 mg total) by  nebulization in the morning and at bedtime. 60 mL 0   busPIRone (BUSPAR) 10 MG tablet Take 10 mg by mouth in the morning and at bedtime.     chlorproMAZINE (THORAZINE) 200 MG tablet Take 200 mg by mouth at bedtime.     diphenhydrAMINE  (BENADRYL ) 50 MG tablet Take 50 mg by mouth at bedtime as needed.     doxepin  (SINEQUAN ) 50 MG capsule Take 50 mg by mouth daily.     EPINEPHrine  0.3 mg/0.3 mL IJ SOAJ injection Inject 0.3 mg into the muscle as needed for anaphylaxis. 2 each 1   fexofenadine  (ALLEGRA ) 180 MG tablet TAKE 1 TABLET BY MOUTH EVERY DAY 90 tablet 1   gabapentin (NEURONTIN) 600 MG tablet Take 1,800 mg by mouth daily.     lithium  600 MG capsule Take 600 mg by mouth at  bedtime.     lithium  carbonate 300 MG capsule Take 300 mg by mouth daily.     loperamide  (IMODIUM  A-D) 2 MG tablet Take 1 tablet (2 mg total) by mouth 4 (four) times daily as needed for diarrhea or loose stools. 30 tablet 0   medroxyPROGESTERone  (DEPO-PROVERA ) 150 MG/ML injection Inject 1 mL (150 mg total) into the muscle every 3 (three) months. 1 mL 0   megestrol  (MEGACE ) 40 MG tablet TAKE 3 TABS X 5 DAYS, 2 TABS X 5 DAYS, 1 TAB DAILY TO CONTROL VAG BLEEDING, STOP WHEN BLEEDING STOPS, (Patient taking differently: as needed. TAKE 3 TABS X 5 DAYS, 2 TABS X 5 DAYS, 1 TAB DAILY TO CONTROL VAG BLEEDING, STOP WHEN BLEEDING STOPS,) 45 tablet 0   ondansetron  (ZOFRAN ) 4 MG tablet TAKE 1 TABLET BY MOUTH EVERY 8 HOURS AS NEEDED FOR NAUSEA AND VOMITING 30 tablet 0   traZODone (DESYREL) 100 MG tablet Take 100 mg by mouth at bedtime.     tretinoin (RETIN-A) 0.025 % cream Apply 1 Application topically at bedtime.     valACYclovir  (VALTREX ) 1000 MG tablet Take 2 grams twice daily x 1 day for outbreaks as needed (Patient taking differently: as needed. Take 2 grams twice daily x 1 day for outbreaks as needed) 40 tablet 1   venlafaxine XR (EFFEXOR-XR) 150 MG 24 hr capsule Take 150 mg by mouth daily.     venlafaxine XR (EFFEXOR-XR) 75 MG 24 hr capsule Take by mouth.     No current facility-administered medications for this visit.    ALLERGIES: Cat dander, Shrimp [shellfish allergy ], Sulfasalazine, and Sulfa antibiotics  Family History  Problem Relation Age of Onset   Allergic rhinitis Mother    Eczema Father    Asthma Father    Allergic rhinitis Father    Depression Father    Depression Sister    Mental illness Sister    Depression Maternal Grandmother    Mental illness Maternal Grandmother    Thyroid  disease Maternal Grandmother    Heart disease Maternal Grandfather    Hypertension Maternal Grandfather    Hyperlipidemia Maternal Grandfather    CAD Maternal Grandfather    Thyroid  disease Other    CAD  Other    Cancer Neg Hx     Review of Systems  All other systems reviewed and are negative.   PHYSICAL EXAM:  BP 116/84 (BP Location: Left Arm, Patient Position: Sitting)   Pulse (!) 103   Ht 5' 7.5 (1.715 m)   Wt 176 lb (79.8 kg)   SpO2 93%   BMI 27.16 kg/m     General appearance: alert, cooperative and  appears stated age Head: normocephalic, without obvious abnormality, atraumatic Neck: no adenopathy, supple, symmetrical, trachea midline and thyroid  normal to inspection and palpation Lungs: clear to auscultation bilaterally Breasts: normal appearance, no masses or tenderness, No nipple retraction or dimpling, No nipple discharge or bleeding, No axillary adenopathy Heart: regular rate and rhythm Abdomen: soft, non-tender; no masses, no organomegaly Extremities: extremities normal, atraumatic, no cyanosis or edema Skin: skin color, texture, turgor normal. No rashes or lesions Lymph nodes: cervical, supraclavicular, and axillary nodes normal. Neurologic: grossly normal  Pelvic: External genitalia:  no lesions              No abnormal inguinal nodes palpated.              Urethra:  normal appearing urethra with no masses, tenderness or lesions              Bartholins and Skenes: normal                 Vagina: normal appearing vagina with normal color and discharge, no lesions              Cervix: no lesions              Pap taken: yes Bimanual Exam:  Uterus:  normal size, contour, position, consistency, mobility, non-tender              Adnexa: no mass, fullness, tenderness          Chaperone was present for exam:  Kari HERO, CMA  ASSESSMENT: Well woman visit with gynecologic exam. Contraceptive counseling.  Current use of Depo Provera . PHQ-2-9: 3.  Currently under treatment.   PLAN: Mammogram screening discussed. Self breast awareness reviewed. Pap and HRV collected:  yes Guidelines for Calcium , Vitamin D, regular exercise program including cardiovascular and weight  bearing exercise. Medication refills:  NA. Patient prefers to take her Depo Provera  at [facility] office, which is close to her home.  I recommend regular intake of calcium  1200 mg daily and vit D at least 600 - 800 international units daily.  Contraceptive counseling provided with discussion of reversable options:  pills, patch, ring, Depo Provera , Nexplanon, and IUDs.  We had concentrated discussion about permanent sterilization with tubal ligation or tubal removal. Risks of procedure may be bleeding, infection, damage to surrounding organs, reaction to general anesthesia, DVT, PE, death, failure of the procedure if ligation versus tubal removal is performed.  She will consider her options.  She is aware she would need to see a surgical colleague for the procedure. Follow up:  yearly and prn.

## 2024-03-19 NOTE — Progress Notes (Signed)
 Subjective:    Kathy Zimmerman is a 33 y.o. female and is here for a comprehensive physical exam.  HPI  Health Maintenance Due  Topic Date Due   Medicare Annual Wellness (AWV)  Never done   Hepatitis B Vaccines (1 of 3 - 19+ 3-dose series) Never done    Acute Concerns: No acute concerns reported today.  Chronic Issues: Depression / Anxiety / Bipolar Disorder  Pt is on Adderall, Venlafaxine XR, Gabapentin, Lithium  Good compliance and tolerance. Mental health is stable. She reports she has suddenly been craving sweets. Denies SI/HI and gestational diabetes. Pt would like to check her lithium  today.  Health Maintenance: Immunizations -- See above. Colonoscopy -- N/a Mammogram -- N/a PAP -- Last done 10/27/2020, NILM. Repeat recommended in 2025. Bone Density -- N/a Diet -- Has not been eating healthy recently, but is starting to improve diet. Exercise -- Does not exercise often.  Sleep habits -- Good sleeping habits. Mood -- Stable.  UTD with dentist? - UTD UTD with eye doctor? - UTD  Weight history: Wt Readings from Last 10 Encounters:  03/19/24 174 lb 6.1 oz (79.1 kg)  01/22/24 162 lb 4 oz (73.6 kg)  12/11/23 163 lb 12.8 oz (74.3 kg)  08/15/23 167 lb 9.6 oz (76 kg)  07/16/23 172 lb (78 kg)  07/09/23 172 lb 6.1 oz (78.2 kg)  03/07/23 170 lb (77.1 kg)  02/28/23 166 lb (75.3 kg)  11/15/22 153 lb 12.8 oz (69.8 kg)  11/01/22 156 lb 12.8 oz (71.1 kg)   Body mass index is 28.15 kg/m. No LMP recorded. Patient has had an injection.  Alcohol use:  reports no history of alcohol use.  Tobacco use:  Tobacco Use: Low Risk  (03/19/2024)   Patient History    Smoking Tobacco Use: Never    Smokeless Tobacco Use: Never    Passive Exposure: Never   Eligible for lung cancer screening? no     03/19/2024    9:30 AM  Depression screen PHQ 2/9  Decreased Interest 0  Down, Depressed, Hopeless 1  PHQ - 2 Score 1  Altered sleeping 1  Tired, decreased energy 0  Change in  appetite 1  Feeling bad or failure about yourself  0  Trouble concentrating 0  Moving slowly or fidgety/restless 0  Suicidal thoughts 0  PHQ-9 Score 3  Difficult doing work/chores Not difficult at all     Other providers/specialists: Patient Care Team: Job Lukes, [place] as PCP - General (Physician Assistant) Delford Maude BROCKS, MD as PCP - Cardiology (Cardiology)    PMHx, SurgHx, SocialHx, Medications, and Allergies were reviewed in the Visit Navigator and updated as appropriate.   Past Medical History:  Diagnosis Date   Anxiety    Asthma    Depression    Headache    IBS (irritable bowel syndrome)    Panic attack    Preeclampsia      Past Surgical History:  Procedure Laterality Date   CESAREAN SECTION N/A 04/10/2015   Procedure: CESAREAN SECTION;  Surgeon: Ovid All, MD;  Location: [facility];  Service: Obstetrics;  Laterality: N/A;   EYE SURGERY     laser   SCLERAL BUCKLE  08/09/2011   Procedure: SCLERAL BUCKLE;  Surgeon: Arley LABOR Rankin;  Location: [facility];  Service: Ophthalmology;  Laterality: Left;  with cryo   TENDON REPAIR     R middle finger     Family History  Problem Relation Age of Onset   Allergic rhinitis Mother  Eczema Father    Asthma Father    Allergic rhinitis Father    Depression Father    Depression Sister    Mental illness Sister    Depression Maternal Grandmother    Mental illness Maternal Grandmother    Thyroid  disease Maternal Grandmother    Heart disease Maternal Grandfather    Hypertension Maternal Grandfather    Hyperlipidemia Maternal Grandfather    CAD Maternal Grandfather    Thyroid  disease Other    CAD Other    Cancer Neg Hx     Social History   Tobacco Use   Smoking status: Never    Passive exposure: Never   Smokeless tobacco: Never  Vaping Use   Vaping status: Never Used  Substance Use Topics   Alcohol use: No   Drug use: No    Review of Systems:   Review of Systems  Constitutional:  Negative for chills,  fever, malaise/fatigue and weight loss.  HENT:  Negative for hearing loss, sinus pain and sore throat.   Respiratory:  Negative for cough and hemoptysis.   Cardiovascular:  Negative for chest pain, palpitations, leg swelling and PND.  Gastrointestinal:  Negative for abdominal pain, constipation, diarrhea, heartburn, nausea and vomiting.  Genitourinary:  Negative for dysuria, frequency and urgency.  Musculoskeletal:  Negative for back pain, myalgias and neck pain.  Skin:  Negative for itching and rash.  Neurological:  Negative for dizziness, tingling, seizures and headaches.  Endo/Heme/Allergies:  Negative for polydipsia.  Psychiatric/Behavioral:  Negative for depression. The patient is not nervous/anxious.       Objective:   BP 122/80 (BP Location: Left Arm, Patient Position: Sitting, Cuff Size: Normal)   Pulse 96   Temp 98.1 F (36.7 C) (Temporal)   Ht 5' 6 (1.676 m)   Wt 174 lb 6.1 oz (79.1 kg)   SpO2 98%   BMI 28.15 kg/m  Body mass index is 28.15 kg/m.   General Appearance:    Alert, cooperative, no distress, appears stated age  Head:    Normocephalic, without obvious abnormality, atraumatic  Eyes:    PERRL, conjunctiva/corneas clear, EOM's intact, fundi    benign, both eyes  Ears:    Normal TM's and external ear canals, both ears  Nose:   Nares normal, septum midline, mucosa normal, no drainage    or sinus tenderness  Throat:   Lips, mucosa, and tongue normal; teeth and gums normal  Neck:   Supple, symmetrical, trachea midline, no adenopathy;    thyroid :  no enlargement/tenderness/nodules; no carotid   bruit or JVD  Back:     Symmetric, no curvature, ROM normal, no CVA tenderness  Lungs:     Clear to auscultation bilaterally, respirations unlabored  Chest Wall:    No tenderness or deformity   Heart:    Regular rate and rhythm, S1 and S2 normal, no murmur, rub or gallop  Breast Exam:    Deferred  Abdomen:     Soft, non-tender, bowel sounds active all four quadrants,     no masses, no organomegaly  Genitalia:    Deferred  Extremities:   Extremities normal, atraumatic, no cyanosis or edema  Pulses:   2+ and symmetric all extremities  Skin:   Skin color, texture, turgor normal, no rashes or lesions  Lymph nodes:   Cervical, supraclavicular, and axillary nodes normal  Neurologic:   CNII-XII intact, normal strength, sensation and reflexes    throughout    Assessment/Plan:   Routine physical examination Today patient counseled  on age appropriate routine health concerns for screening and prevention, each reviewed and up to date or declined. Immunizations reviewed and up to date or declined. Labs ordered and reviewed. Risk factors for depression reviewed and negative. Hearing function and visual acuity are intact. ADLs screened and addressed as needed. Functional ability and level of safety reviewed and appropriate. Education, counseling and referrals performed based on assessed risks today. Patient provided with a copy of personalized plan for preventive services.  Overweight Continue efforts at healthy diet and exercise  Generalized anxiety disorder; Depression Ongoing Overall stable per patient report Management per psychiatry Denies suicidal ideation/hi   High risk medication use Update lithium  panel   Encounter for other contraceptive management She is planning to see gynecology later today I have asked her to discuss long-term contraception options with them and let us  know what the verdict of this discussion is    I, Lavern Simmers, acting as a Neurosurgeon for [employer], [place]., have documented all relevant documentation on the behalf of [name], [place], as directed by [name], PA while in the presence of [name], [place].  I, [name], [place], have reviewed all documentation for this visit. The documentation on 03/19/24 for the exam, diagnosis, procedures, and orders are all accurate and complete.  [name], PA-C [facility]

## 2024-03-19 NOTE — Patient Instructions (Signed)
 It was great to see you!  Please return for your next Depo-Provera  shot Sept 24 thru Oct 8.  Call and see if [facility] Testing is covered for you -- ask Dr Shirline if this would be helpful for you  Let's follow-up at the end of September to plan for your upcoming trip and do your depo shot, please go ahead and see travel clinic in the meantime, sooner if you have concerns.  Take care,  [name] PA-C

## 2024-03-19 NOTE — Patient Instructions (Addendum)
 Closing the Fallopian Tubes (Laparoscopic Tubal Ligation): What to Expect Laparoscopic tubal ligation is closing the fallopian tubes to prevent pregnancy. When the fallopian tubes are closed, the eggs that your ovaries release can't enter the uterus, and sperm can't reach the released eggs. This is a permanent form of birth control. You shouldn't have this done if you want to get pregnant someday or if you're unsure about having more children. Tell a health care provider about: Any allergies you have. All medicines you take. These include vitamins, herbs, eye drops, and creams. Any problems you or family members have had with anesthesia. Any bleeding problems you have. Any surgeries you've had. Any medical problems you have. Whether you're pregnant or may be pregnant. Other pregnancies in the past. What are the risks? Your health care provider will talk with you about risks. These may include: Infection. Bleeding. Injury to other organs in the belly. Side effects from anesthesia. The ligation not working. If this happens, you could get pregnant. Ectopic pregnancy. This is a pregnancy in which the fertilized egg attaches outside the uterus. What happens before? When to stop eating and drinking Eat and drink only as you've been told. You may be told this: 8 hours before your procedure Stop eating most foods. Do not eat meat, fried foods, or fatty foods. Eat only light foods, such as toast or crackers. All liquids are OK except energy drinks and alcohol. 6 hours before your procedure Stop eating. Drink only clear liquids, such as water, clear fruit juice, black coffee, plain tea, and sports drinks. Do not drink energy drinks or alcohol. 2 hours before your procedure Stop drinking all liquids. You may be allowed to take medicines with small sips of water. If you don't eat and drink as told, your procedure may be delayed or canceled. Medicines Ask about changing or stopping: Any  medicines you take. Any vitamins, herbs, or supplements you take. Do not take aspirin or ibuprofen  unless you're told to. Surgery safety For your safety, you may: Need to wash your skin with a soap that kills germs. Get antibiotics. Have your procedure site marked. Have hair removed at the procedure site. General instructions Do not smoke, vape, or use nicotine or tobacco for at least 4 weeks before the procedure. Ask if you'll be staying overnight in the hospital. If you'll be going home right after the surgery, plan to have a responsible adult: Drive you home from the hospital or clinic. You won't be allowed to drive. Stay with you for the time you're told. What happens during laparoscopic tubal ligation?     An IV will be put into a vein in your hand or arm. You may be given: A sedative to help you relax. Anesthesia to keep you from feeling pain. A tube may be put down your throat to help you breathe. Your bladder may be emptied with a thin, soft tube (catheter). Two small cuts will be made in your lower belly and near your belly button. A gas will be pumped into your belly. This will let the surgeon see better and will give them room to work. A lighted tube with camera (laparoscope) will be put into your belly through one of the cuts. Small instruments will be inserted through the other cut. The fallopian tubes will be tied off, burned, or blocked with a clip, ring, or clamp. A small portion in the center of each fallopian tube may be removed. The gas will be released from your belly. The  cuts will be closed with stitches or skin tape. A bandage will be placed over the cuts. These steps may vary. Ask what you can expect. What happens after? You'll be watched closely until you leave. This includes checking your pain level, blood pressure, heart rate, and breathing rate. You'll be given medicine to help with pain. You'll be given medicines to prevent throwing up or the feeling  that you may throw up. You may have discharge from the vagina. You may need to wear a pad. If you were given a sedative, do not drive or use machines until you're told it's safe. A sedative can make you sleepy. This information is not intended to replace advice given to you by your health care provider. Make sure you discuss any questions you have with your health care provider. Document Revised: 06/17/2023 Document Reviewed: 06/17/2023 Elsevier Patient Education  2025 [employer]  EXERCISE AND DIET:  We recommended that you start or continue a regular exercise program for good health. Regular exercise means any activity that makes your heart beat faster and makes you sweat.  We recommend exercising at least 30 minutes per day at least 3 days a week, preferably 4 or 5.  We also recommend a diet low in fat and sugar.  Inactivity, poor dietary choices and obesity can cause diabetes, heart attack, stroke, and kidney damage, among others.    ALCOHOL AND SMOKING:  Women should limit their alcohol intake to no more than 7 drinks/beers/glasses of wine (combined, not each!) per week. Moderation of alcohol intake to this level decreases your risk of breast cancer and liver damage. And of course, no recreational drugs are part of a healthy lifestyle.  And absolutely no smoking or even second hand smoke. Most people know smoking can cause heart and lung diseases, but did you know it also contributes to weakening of your bones? Aging of your skin?  Yellowing of your teeth and nails?  CALCIUM  AND VITAMIN D:  Adequate intake of calcium  and Vitamin D are recommended.  The recommendations for exact amounts of these supplements seem to change often, but generally speaking 600 mg of calcium  (either carbonate or citrate) and 800 units of Vitamin D per day seems prudent. Certain women may benefit from higher intake of Vitamin D.  If you are among these women, your doctor will have told you during your visit.    PAP  SMEARS:  Pap smears, to check for cervical cancer or precancers,  have traditionally been done yearly, although recent scientific advances have shown that most women can have pap smears less often.  However, every woman still should have a physical exam from her gynecologist every year. It will include a breast check, inspection of the vulva and vagina to check for abnormal growths or skin changes, a visual exam of the cervix, and then an exam to evaluate the size and shape of the uterus and ovaries.  And after 32 years of age, a rectal exam is indicated to check for rectal cancers. We will also provide age appropriate advice regarding health maintenance, like when you should have certain vaccines, screening for sexually transmitted diseases, bone density testing, colonoscopy, mammograms, etc.   MAMMOGRAMS:  All women over 74 years old should have a yearly mammogram. Many facilities now offer a 3D mammogram, which may cost around $50 extra out of pocket. If possible,  we recommend you accept the option to have the 3D mammogram performed.  It both reduces the number of women  who will be called back for extra views which then turn out to be normal, and it is better than the routine mammogram at detecting truly abnormal areas.    COLONOSCOPY:  Colonoscopy to screen for colon cancer is recommended for all women at age 6.  We know, you hate the idea of the prep.  We agree, BUT, having colon cancer and not knowing it is worse!!  Colon cancer so often starts as a polyp that can be seen and removed at colonscopy, which can quite literally save your life!  And if your first colonoscopy is normal and you have no family history of colon cancer, most women don't have to have it again for 10 years.  Once every ten years, you can do something that may end up saving your life, right?  We will be happy to help you get it scheduled when you are ready.  Be sure to check your insurance coverage so you understand how much it  will cost.  It may be covered as a preventative service at no cost, but you should check your particular policy.

## 2024-03-20 ENCOUNTER — Other Ambulatory Visit: Payer: Self-pay | Admitting: Physician Assistant

## 2024-03-20 ENCOUNTER — Encounter: Payer: Self-pay | Admitting: Physician Assistant

## 2024-03-20 ENCOUNTER — Ambulatory Visit: Payer: Self-pay | Admitting: Physician Assistant

## 2024-03-20 DIAGNOSIS — F603 Borderline personality disorder: Secondary | ICD-10-CM | POA: Diagnosis not present

## 2024-03-20 DIAGNOSIS — F4312 Post-traumatic stress disorder, chronic: Secondary | ICD-10-CM | POA: Diagnosis not present

## 2024-03-20 DIAGNOSIS — R635 Abnormal weight gain: Secondary | ICD-10-CM

## 2024-03-20 DIAGNOSIS — E663 Overweight: Secondary | ICD-10-CM

## 2024-03-20 DIAGNOSIS — F3162 Bipolar disorder, current episode mixed, moderate: Secondary | ICD-10-CM | POA: Diagnosis not present

## 2024-03-20 LAB — LITHIUM LEVEL: Lithium Lvl: 0.8 mmol/L (ref 0.6–1.2)

## 2024-03-20 NOTE — Telephone Encounter (Signed)
 Please see patient message and advise on further testing

## 2024-03-21 ENCOUNTER — Emergency Department (HOSPITAL_BASED_OUTPATIENT_CLINIC_OR_DEPARTMENT_OTHER)
Admission: EM | Admit: 2024-03-21 | Discharge: 2024-03-21 | Disposition: A | Discharge status: Home / Self Care | Attending: Emergency Medicine | Admitting: Emergency Medicine

## 2024-03-21 ENCOUNTER — Other Ambulatory Visit: Payer: Self-pay

## 2024-03-21 ENCOUNTER — Encounter: Payer: Self-pay | Admitting: Physician Assistant

## 2024-03-21 ENCOUNTER — Encounter (HOSPITAL_BASED_OUTPATIENT_CLINIC_OR_DEPARTMENT_OTHER): Payer: Self-pay | Admitting: Emergency Medicine

## 2024-03-21 DIAGNOSIS — R070 Pain in throat: Secondary | ICD-10-CM | POA: Diagnosis present

## 2024-03-21 DIAGNOSIS — T781XXA Other adverse food reactions, not elsewhere classified, initial encounter: Secondary | ICD-10-CM | POA: Diagnosis not present

## 2024-03-21 DIAGNOSIS — T7840XA Allergy, unspecified, initial encounter: Secondary | ICD-10-CM | POA: Diagnosis not present

## 2024-03-21 MED ORDER — METHYLPREDNISOLONE SODIUM SUCC 125 MG IJ SOLR
125.0000 mg | Freq: Once | INTRAMUSCULAR | Status: AC
  Administered 2024-03-21: 125 mg via INTRAVENOUS
  Filled 2024-03-21: qty 2

## 2024-03-21 MED ORDER — EPINEPHRINE 0.3 MG/0.3ML IJ SOAJ
0.3000 mg | Freq: Once | INTRAMUSCULAR | Status: AC
  Administered 2024-03-21: 0.3 mg via INTRAMUSCULAR

## 2024-03-21 NOTE — ED Triage Notes (Signed)
 Ate at 2000- began feeling throat tightness, lips tingling, clear lungs. Allergic to shrimp. Took 4 benadryl . Has an Epi pen.

## 2024-03-21 NOTE — Telephone Encounter (Signed)
Please see patient message below and advise

## 2024-03-21 NOTE — Discharge Instructions (Signed)
 Please return to the department if you experience difficulty swallowing, throat closing sensation, shortness of breath, chest pain, or if you use your EpiPen  at home

## 2024-03-21 NOTE — ED Provider Notes (Signed)
 [place] EMERGENCY DEPARTMENT AT [facility] Provider Note   CSN: [id number] Arrival date & time: 03/21/24  2043     Patient presents with: Allergic Reaction   Kathy Zimmerman is a 33 y.o. female with shrimp allergy  presents emergency department for evaluation of lip paresthesia, throat tightness following eating tacos at [place] food car 45 minutes prior to arrival.  She has allergic reaction to shrimp.  Has her own EpiPen  but did not use it today.  She used for Benadryl  prior to arrival.  Denies rashes    Allergic Reaction Presenting symptoms: difficulty swallowing        Prior to Admission medications   Medication Sig Start Date End Date Taking? Authorizing Provider  amphetamine-dextroamphetamine (ADDERALL XR) 25 MG 24 hr capsule Take 25 mg by mouth daily. 12/28/23   [provider]  budesonide  (PULMICORT ) 1 MG/2ML nebulizer solution Take 2 mLs (1 mg total) by nebulization in the morning and at bedtime. 09/23/20   Waddell Rake, MD  busPIRone (BUSPAR) 10 MG tablet Take 10 mg by mouth in the morning and at bedtime. 04/13/20   [provider]  chlorproMAZINE (THORAZINE) 200 MG tablet Take 200 mg by mouth at bedtime. 06/15/23   [provider]  diphenhydrAMINE  (BENADRYL ) 50 MG tablet Take 50 mg by mouth at bedtime as needed.    [provider]  doxepin  (SINEQUAN ) 50 MG capsule Take 50 mg by mouth daily. 12/28/23   [provider]  EPINEPHrine  0.3 mg/0.3 mL IJ SOAJ injection Inject 0.3 mg into the muscle as needed for anaphylaxis. 11/15/22   Luke Orlan HERO, DO  fexofenadine  (ALLEGRA ) 180 MG tablet TAKE 1 TABLET BY MOUTH EVERY DAY 05/21/23   Job Lukes, PA  gabapentin (NEURONTIN) 600 MG tablet Take 1,800 mg by mouth daily. 04/02/20   [provider]  lithium  600 MG capsule Take 600 mg by mouth at bedtime. 12/28/23   [provider]  lithium  carbonate 300 MG capsule Take 300 mg by mouth daily. 08/26/21   [provider]  loperamide  (IMODIUM  A-D) 2 MG tablet Take 1 tablet (2 mg total) by mouth 4 (four) times daily as needed for diarrhea or loose stools. 07/06/21   Job Lukes, PA  medroxyPROGESTERone  (DEPO-PROVERA ) 150 MG/ML injection Inject 1 mL (150 mg total) into the muscle every 3 (three) months. 04/06/22   Job Lukes, PA  megestrol  (MEGACE ) 40 MG tablet TAKE 3 TABS X 5 DAYS, 2 TABS X 5 DAYS, 1 TAB DAILY TO CONTROL VAG BLEEDING, STOP WHEN BLEEDING STOPS, Patient taking differently: as needed. TAKE 3 TABS X 5 DAYS, 2 TABS X 5 DAYS, 1 TAB DAILY TO CONTROL VAG BLEEDING, STOP WHEN BLEEDING STOPS, 03/23/22   Job, Lukes, PA  ondansetron  (ZOFRAN ) 4 MG tablet TAKE 1 TABLET BY MOUTH EVERY 8 HOURS AS NEEDED FOR NAUSEA AND VOMITING 08/16/23   Job Lukes, PA  traZODone (DESYREL) 100 MG tablet Take 100 mg by mouth at bedtime. 02/29/24   [provider]  tretinoin (RETIN-A) 0.025 % cream Apply 1 Application topically at bedtime. 10/31/22   [provider]  valACYclovir  (VALTREX ) 1000 MG tablet Take 2 grams twice daily x 1 day for outbreaks as needed Patient taking differently: as needed. Take 2 grams twice daily x 1 day for outbreaks as needed 08/24/23   Job Lukes, PA  venlafaxine XR (EFFEXOR-XR) 150 MG 24 hr capsule Take 150 mg by mouth daily. 04/14/20   [provider]  venlafaxine XR (EFFEXOR-XR) 75  MG 24 hr capsule Take by mouth. 08/15/21   [provider]    Allergies: Cat dander, Shrimp [shellfish allergy ], Sulfasalazine, and Sulfa antibiotics    Review of Systems  HENT:  Positive for trouble swallowing.     Updated Vital Signs BP (!) 137/105   Pulse 99   Temp 98 F (36.7 C) (Oral)   Resp (!) 25   SpO2 100%   Physical Exam Vitals and nursing note reviewed.  Constitutional:      General: She is not in acute distress.    Appearance: Normal appearance.  HENT:     Head: Normocephalic and atraumatic.     Mouth/Throat:     Mouth:  Mucous membranes are moist.     Pharynx: Uvula midline. No oropharyngeal exudate, posterior oropharyngeal erythema or uvula swelling.     Tonsils: No tonsillar exudate or tonsillar abscesses. 2+ on the right. 2+ on the left.     Comments: No submandibular swelling or tenderness.  Maintaining secretions without difficulty nor drooling. Eyes:     Conjunctiva/sclera: Conjunctivae normal.  Cardiovascular:     Rate and Rhythm: Normal rate.  Pulmonary:     Effort: Pulmonary effort is normal. No respiratory distress.  Skin:    General: Skin is warm.     Coloration: Skin is not jaundiced or pale.     Comments: No rash to face, chest, abdomen, back, extremities  Neurological:     Mental Status: She is alert. Mental status is at baseline.     (all labs ordered are listed, but only abnormal results are displayed) Labs Reviewed - No data to display  EKG: None  Radiology: No results found.   Medications Ordered in the ED  EPINEPHrine  (EPI-PEN) injection 0.3 mg (0.3 mg Intramuscular Given 03/21/24 2103)  methylPREDNISolone  sodium succinate (SOLU-MEDROL ) 125 mg/2 mL injection 125 mg (125 mg Intravenous Given 03/21/24 2311)                                    Medical Decision Making Risk Prescription drug management.   Patient presents to the ED for concern of lip paresthesia, throat closing sensation, this involves an extensive number of treatment options, and is a complaint that carries with it a high risk of complications and morbidity.  The differential diagnosis includes allergic reaction, anaphylaxis, Ludwig's angina, strep, URI, tonsillar abscess, retropharyngeal abscess, foreign body   Co morbidities that complicate the patient evaluation  Allergies shellfish   Additional history obtained:  Additional history obtained from Nursing   External records from outside source obtained and reviewed including triage note    Medicines ordered and prescription drug  management:  I ordered medication including epipen , Solu-Medrol  for allergic reaction Reevaluation of the patient after these medicines showed that the patient resolved I have reviewed the patients home medicines and have made adjustments as needed     Problem List / ED Course:  Allergic reaction Patient took 4 Benadryl  25 mg prior to arrival. Due to having complaints of lip paresthesia, throat closing sensation, EpiPen  and solumedrol administration.  No rash.  Lung sounds CTAB with no wheezing Patient has her prescribed EpiPen  with her that she did not use.  Do not need to provide her with prescription. Patient wishes to be discharged 2 and half hours following arrival to ED.  She is now at least 3 hours following possible shrimp allergen.  She has been given follow-up  with the dentist.  On reassessment, she continues to have clear lung sounds and maintaining her airway without difficulty.  No complaints of throat closing sensation, lip paresthesia at this time   Reevaluation:  After the interventions noted above, I reevaluated the patient and found that they have :resolved   Social Determinants of Health:  Has PCP   Dispostion:  After consideration of the diagnostic results and the patients response to treatment, I feel that the patent would benefit from outpatient management with PCP follow-up if necessary.   Discussed ED workup, disposition, return to ED precautions with patient who expresses understanding agrees with plan.  All questions answered to their satisfaction.  They are agreeable to plan.  Discharge instructions provided on paperwork  Final diagnoses:  Allergic reaction, initial encounter    ED Discharge Orders     None          Minnie Tinnie BRAVO, PA 03/21/24 2324    Randol Simmonds, MD 03/24/24 [phone]

## 2024-03-21 NOTE — Telephone Encounter (Signed)
Please review and advise on refills.

## 2024-03-24 ENCOUNTER — Other Ambulatory Visit: Payer: Self-pay | Admitting: Physician Assistant

## 2024-03-24 DIAGNOSIS — R194 Change in bowel habit: Secondary | ICD-10-CM

## 2024-03-24 MED ORDER — EPINEPHRINE 0.3 MG/0.3ML IJ SOAJ
0.3000 mg | INTRAMUSCULAR | 1 refills | Status: AC | PRN
Start: 2024-03-24 — End: ?

## 2024-03-28 NOTE — Telephone Encounter (Signed)
 Please advise on referral for nutritionist.

## 2024-03-29 ENCOUNTER — Encounter: Payer: Self-pay | Admitting: Physician Assistant

## 2024-03-29 DIAGNOSIS — R0689 Other abnormalities of breathing: Secondary | ICD-10-CM

## 2024-03-31 ENCOUNTER — Other Ambulatory Visit: Payer: Self-pay

## 2024-03-31 ENCOUNTER — Ambulatory Visit: Payer: Self-pay | Admitting: Radiology

## 2024-03-31 ENCOUNTER — Encounter (HOSPITAL_BASED_OUTPATIENT_CLINIC_OR_DEPARTMENT_OTHER): Payer: Self-pay

## 2024-03-31 ENCOUNTER — Emergency Department (HOSPITAL_BASED_OUTPATIENT_CLINIC_OR_DEPARTMENT_OTHER)
Admission: EM | Admit: 2024-03-31 | Discharge: 2024-03-31 | Disposition: A | Discharge status: Home / Self Care | Attending: Emergency Medicine | Admitting: Emergency Medicine

## 2024-03-31 ENCOUNTER — Emergency Department (HOSPITAL_BASED_OUTPATIENT_CLINIC_OR_DEPARTMENT_OTHER)

## 2024-03-31 DIAGNOSIS — G4489 Other headache syndrome: Secondary | ICD-10-CM | POA: Insufficient documentation

## 2024-03-31 DIAGNOSIS — H538 Other visual disturbances: Secondary | ICD-10-CM | POA: Diagnosis not present

## 2024-03-31 DIAGNOSIS — J45909 Unspecified asthma, uncomplicated: Secondary | ICD-10-CM | POA: Insufficient documentation

## 2024-03-31 DIAGNOSIS — R519 Headache, unspecified: Secondary | ICD-10-CM | POA: Diagnosis not present

## 2024-03-31 LAB — CYTOLOGY - PAP
Comment: NEGATIVE
Diagnosis: NEGATIVE
Diagnosis: REACTIVE
High risk HPV: NEGATIVE

## 2024-03-31 MED ORDER — METOCLOPRAMIDE HCL 5 MG/ML IJ SOLN
10.0000 mg | Freq: Once | INTRAMUSCULAR | Status: AC
  Administered 2024-03-31: 10 mg via INTRAVENOUS
  Filled 2024-03-31: qty 2

## 2024-03-31 MED ORDER — DIPHENHYDRAMINE HCL 50 MG/ML IJ SOLN
25.0000 mg | Freq: Once | INTRAMUSCULAR | Status: AC
  Administered 2024-03-31: 25 mg via INTRAVENOUS
  Filled 2024-03-31: qty 1

## 2024-03-31 MED ORDER — SODIUM CHLORIDE 0.9 % IV BOLUS
1000.0000 mL | Freq: Once | INTRAVENOUS | Status: AC
  Administered 2024-03-31: 1000 mL via INTRAVENOUS

## 2024-03-31 NOTE — ED Triage Notes (Signed)
 Arrives POV for headache x 4 days. + Nausea/ -Vomiting. Taking tylenol  with some relief.

## 2024-03-31 NOTE — ED Notes (Signed)
 Patient transported to CT

## 2024-03-31 NOTE — Discharge Instructions (Signed)

## 2024-03-31 NOTE — ED Provider Notes (Signed)
 [place] EMERGENCY DEPARTMENT AT [facility] Provider Note   CSN: [id number] Arrival date & time: 03/31/24  9549     Patient presents with: Headache   Kathy Zimmerman is a 33 y.o. female.   The history is provided by the patient.   Patient with history of bipolar disorder, PTSD presents with headache.  Patient reports around 4 days ago she woke up with a headache.  It starts on the top of her head and will at times go down to her left eye and face.  Occasionally will move into her neck.  No recent trauma No fevers or vomiting but she does report nausea.  She reports mild blurred vision but no visual loss or diplopia.  No focal arm or leg weakness and she remains ambulatory.  No tick bites or rashes.  No recent history of migraines.  No previous history of CVA No new changes in her medications.  Reports she has been on lithium  for years and had recent labs that were within normal limits/therapeutic range She reports the headache is intermittent at times.  States she went to bed feeling well and the pain returned around 4 AM so she wanted to be evaluated.   Past Medical History:  Diagnosis Date   Anxiety    Asthma    Bipolar disorder (HCC)    Depression    Headache    IBS (irritable bowel syndrome)    Panic attack    Preeclampsia    PTSD (post-traumatic stress disorder)     Prior to Admission medications   Medication Sig Start Date End Date Taking? Authorizing Provider  amphetamine-dextroamphetamine (ADDERALL XR) 25 MG 24 hr capsule Take 25 mg by mouth daily. 12/28/23   [provider]  budesonide  (PULMICORT ) 1 MG/2ML nebulizer solution Take 2 mLs (1 mg total) by nebulization in the morning and at bedtime. 09/23/20   Waddell Rake, MD  busPIRone (BUSPAR) 10 MG tablet Take 10 mg by mouth in the morning and at bedtime. 04/13/20   [provider]  diphenhydrAMINE  (BENADRYL ) 50 MG tablet Take 50 mg by mouth at bedtime as needed.    [provider]   doxepin  (SINEQUAN ) 50 MG capsule Take 50 mg by mouth daily. 12/28/23   [provider]  EPINEPHrine  0.3 mg/0.3 mL IJ SOAJ injection Inject 0.3 mg into the muscle as needed for anaphylaxis. 03/24/24   Job Lukes, PA  fexofenadine  (ALLEGRA ) 180 MG tablet TAKE 1 TABLET BY MOUTH EVERY DAY 05/21/23   Job Lukes, PA  gabapentin (NEURONTIN) 600 MG tablet Take 1,800 mg by mouth daily. 04/02/20   [provider]  lithium  600 MG capsule Take 600 mg by mouth at bedtime. 12/28/23   [provider]  lithium  carbonate 300 MG capsule Take 300 mg by mouth daily. 08/26/21   [provider]  medroxyPROGESTERone  (DEPO-PROVERA ) 150 MG/ML injection Inject 1 mL (150 mg total) into the muscle every 3 (three) months. 04/06/22   Job Lukes, PA  megestrol  (MEGACE ) 40 MG tablet TAKE 3 TABS X 5 DAYS, 2 TABS X 5 DAYS, 1 TAB DAILY TO CONTROL VAG BLEEDING, STOP WHEN BLEEDING STOPS, Patient taking differently: as needed. TAKE 3 TABS X 5 DAYS, 2 TABS X 5 DAYS, 1 TAB DAILY TO CONTROL VAG BLEEDING, STOP WHEN BLEEDING STOPS, 03/23/22   Job Lukes, PA  ondansetron  (ZOFRAN ) 4 MG tablet TAKE 1 TABLET BY MOUTH EVERY 8 HOURS AS NEEDED FOR NAUSEA AND VOMITING 08/16/23   Job Lukes, PA  traZODone (DESYREL) 100 MG tablet Take 100 mg by mouth at bedtime. 02/29/24   [provider]  tretinoin (RETIN-A) 0.025 % cream Apply 1 Application topically at bedtime. 10/31/22   [provider]  valACYclovir  (VALTREX ) 1000 MG tablet Take 2 grams twice daily x 1 day for outbreaks as needed Patient taking differently: as needed. Take 2 grams twice daily x 1 day for outbreaks as needed 08/24/23   Job Lukes, PA  venlafaxine XR (EFFEXOR-XR) 150 MG 24 hr capsule Take 150 mg by mouth daily. 04/14/20   [provider]  venlafaxine XR (EFFEXOR-XR) 75 MG 24 hr capsule Take by mouth. 08/15/21   [provider]    Allergies: Cat dander, Shrimp [shellfish allergy ],  Sulfasalazine, and Sulfa antibiotics    Review of Systems  Constitutional:  Negative for fever.  Eyes:  Negative for pain.       Brief blurred vision, no diplopia, no visual loss   Cardiovascular:  Negative for chest pain.  Gastrointestinal:  Positive for nausea. Negative for vomiting.  Musculoskeletal:  Negative for neck stiffness.  Skin:  Negative for rash.  Neurological:  Positive for dizziness and headaches. Negative for speech difficulty, weakness and numbness.    Updated Vital Signs BP 104/71   Pulse 93   Temp 97.9 F (36.6 C) (Oral)   Resp 17   SpO2 99%   Physical Exam CONSTITUTIONAL: Well developed/well nourished HEAD: Normocephalic/atraumatic EYES: EOMI/PERRL, no nystagmus, no ptosis No corneal haziness No conjunctival injection ENMT: Mucous membranes moist NECK: supple no meningeal signs, no bruits CV: S1/S2 noted, no murmurs/rubs/gallops noted LUNGS: Lungs are clear to auscultation bilaterally, no apparent distress NEURO:Awake/alert, face symmetric, no arm or leg drift is noted Equal 5/5 strength with shoulder abduction, elbow flex/extension, wrist flex/extension in upper extremities and equal hand grips bilaterally Equal 5/5 strength with hip flexion,knee flex/extension, foot dorsi/plantar flexion Cranial nerves 3/4/5/6/03/19/09/11/12 tested and intact Gait normal without ataxia No past pointing Sensation to light touch intact in all extremities EXTREMITIES: pulses normal, full ROM SKIN: warm, color normal PSYCH: mildly anxious  (all labs ordered are listed, but only abnormal results are displayed) Labs Reviewed - No data to display  EKG: None  Radiology: CT Head Wo Contrast Result Date: 03/31/2024 CLINICAL DATA:  33 year old female with persistent headache for 4 days. Nausea. EXAM: CT HEAD WITHOUT CONTRAST TECHNIQUE: Contiguous axial images were obtained from the base of the skull through the vertex without intravenous contrast. RADIATION DOSE REDUCTION:  This exam was performed according to the departmental dose-optimization program which includes automated exposure control, adjustment of the mA and/or kV according to patient size and/or use of iterative reconstruction technique. COMPARISON:  Head CT 06/26/2012. FINDINGS: Brain: Cerebral volume remains normal. No midline shift, ventriculomegaly, mass effect, evidence of mass lesion, intracranial hemorrhage or evidence of cortically based acute infarction. Gray-white matter differentiation is within normal limits throughout the brain. Normal basilar cisterns. Vascular: No suspicious intracranial vascular hyperdensity. Skull: Intact, negative. Sinuses/Orbits: Occasional mild paranasal sinus mucosal thickening but overall Visualized paranasal sinuses and mastoids are stable and well aerated. Tympanic cavities appear clear. Other: Chronic postoperative changes to the globes, new on the right since 2013. Visualized scalp soft tissues are within normal limits. IMPRESSION: Normal noncontrast Head CT. Electronically Signed   By: VEAR Hurst M.D.   On: 03/31/2024 05:41     Procedures   Medications Ordered in the ED  metoCLOPramide  (REGLAN ) injection 10 mg (10 mg Intravenous Given 03/31/24 0541)  diphenhydrAMINE  (BENADRYL ) injection 25  mg (25 mg Intravenous Given 03/31/24 0540)  sodium chloride  0.9 % bolus 1,000 mL (1,000 mLs Intravenous New Bag/Given 03/31/24 0605)    Clinical Course as of 03/31/24 0617  Mon Mar 31, 2024  0615 Patient with history of bipolar presents with headache intermittently for the past 4 days.  She denied any neurodeficits from this, but was reporting nausea.  We proceeded with CT head as patient has never really had these types of headaches previously.  As a screening mechanism, there is no acute findings.  I did caution patient that a CT head does not rule out all emergent causes of headache, though given her benign exam I have low suspicion for acute neurologic emergency. [DW]  9383 She is  overall feeling improved.  She has no new complaints.  I advised that if her headaches continue she can follow with her PCP.  We discussed strict ER return precautions [DW]    Clinical Course User Index [DW] Midge Golas, MD                                 Medical Decision Making Amount and/or Complexity of Data Reviewed Radiology: ordered.  Risk Prescription drug management.   This patient presents to the ED for concern of headache, this involves an extensive number of treatment options, and is a complaint that carries with it a high risk of complications and morbidity.  The differential diagnosis includes but is not limited to subarachnoid hemorrhage, intracranial hemorrhage, meningitis, encephalitis, CVST, idiopathic intracranial hypertension, migraine, sinus headache   Comorbidities that complicate the patient evaluation: Patient's presentation is complicated by their history of bipolar disorder  Social Determinants of Health: Patient's poor health literacy  increases the complexity of managing their presentation  Additional history obtained: Records reviewed Primary Care Documents  Imaging Studies ordered: I ordered imaging studies including CT scan head  I independently visualized and interpreted imaging which showed no acute findings I agree with the radiologist interpretation  Medicines ordered and prescription drug management: I ordered medication including reglan /benadryl   for headache  Reevaluation of the patient after these medicines showed that the patient    improved  Test Considered: I considered MRI brain but since patient is improving without neurodeficits will defer for now  Reevaluation: After the interventions noted above, I reevaluated the patient and found that they have :improved  Complexity of problems addressed: Patient's presentation is most consistent with  acute presentation with potential threat to life or bodily  function  Disposition: After consideration of the diagnostic results and the patient's response to treatment,  I feel that the patent would benefit from discharge  .        Final diagnoses:  Other headache syndrome    ED Discharge Orders     None          Midge Golas, MD 03/31/24 [phone]

## 2024-04-02 ENCOUNTER — Encounter: Payer: Self-pay | Admitting: Gastroenterology

## 2024-04-14 ENCOUNTER — Encounter: Payer: Self-pay | Admitting: Physician Assistant

## 2024-04-14 ENCOUNTER — Telehealth (INDEPENDENT_AMBULATORY_CARE_PROVIDER_SITE_OTHER): Admitting: Physician Assistant

## 2024-04-14 VITALS — Ht 67.5 in | Wt 176.0 lb

## 2024-04-14 DIAGNOSIS — F341 Dysthymic disorder: Secondary | ICD-10-CM | POA: Diagnosis not present

## 2024-04-14 DIAGNOSIS — F603 Borderline personality disorder: Secondary | ICD-10-CM | POA: Diagnosis not present

## 2024-04-14 DIAGNOSIS — R519 Headache, unspecified: Secondary | ICD-10-CM

## 2024-04-14 DIAGNOSIS — F3162 Bipolar disorder, current episode mixed, moderate: Secondary | ICD-10-CM | POA: Diagnosis not present

## 2024-04-14 DIAGNOSIS — F4312 Post-traumatic stress disorder, chronic: Secondary | ICD-10-CM | POA: Diagnosis not present

## 2024-04-14 NOTE — Progress Notes (Signed)
 Virtual Visit via Video Note   I, [name], connected with  [name]  ([id number], [birth year]) on 04/14/24 at  9:40 AM EDT by a video-enabled telemedicine application and verified that I am speaking with the correct person using two identifiers.  Location: Patient: Home Provider: [place] [facility] [place] office   I discussed the limitations of evaluation and management by telemedicine and the availability of in person appointments. The patient expressed understanding and agreed to proceed.    History of Present Illness: Kathy Zimmerman is a 33 y.o. who identifies as a female who was assigned female at birth, and is being seen today for follow up from headache(s).  Discussed the use of AI scribe software for clinical note transcription with the patient, who gave verbal consent to proceed.  History of Present Illness Kathy Zimmerman is a 33 year old female who presents with worsening headaches.  She experiences severe headaches, with a particularly intense episode lasting four days, prompting her to seek emergency care. The onset is sudden, often waking up with the headache, and over-the-counter medications like Tylenol  have been ineffective. She received a 'headache cocktail' in the emergency room, and reports that her head CT did not show anything abnormal according to what she was told.  She has a history of headaches located behind her left eye, which have worsened since her eye surgery in 2012 or 2013. Despite consulting her retina specialist, no retinal issues have been identified that are contributing to her headache(s). Movement exacerbates the pain, and she experiences nausea but no photophobia or phonophobia. The headaches are generally still located behind her left eye, and she finds some relief by applying pressure to the area.  Her stress levels have been high, and her depression and suicidal ideations have intensified over the past month. She feels that her  current mental health medications may not be as effective as before. There have been no changes in her mental health medication regimen, and her sleep is 'close' to adequate. Her caffeine  consumption is high, and she acknowledges not drinking enough water, which she describes as her baseline state.  In terms of family history, her mother has a seizure disorder, but there is no family history of migraines. She denies any neurological consultations in the past. During the review of symptoms, she reports occasional facial numbness and a cold nose when the headache pain is intense, along with rhinorrhea. She experiences blurred vision, which she attributes to her baseline condition of living with blurred vision, floaters, and flashes.     Problems:  Patient Active Problem List   Diagnosis Date Noted   Anaphylactic reaction due to food, subsequent encounter 11/15/2022   Other allergic rhinitis 11/15/2022   Mixed bipolar affective disorder, moderate (HCC) 12/11/2020   Asthma 04/19/2020   Depression 04/19/2020   Borderline personality disorder (HCC) 04/19/2020   PTSD (post-traumatic stress disorder) 08/15/2018   GAD (generalized anxiety disorder) 10/25/2017   Recurrent vertigo 07/12/2017   POTS (postural orthostatic tachycardia syndrome) 09/11/2016   Hx of severe preeclampsia 03/20/2016   Anxiety 04/11/2015   Retinal detachment of left eye with retinal break 08/08/2011    Class: Acute    Allergies:  Allergies  Allergen Reactions   Cat Dander Anaphylaxis   Shrimp [Shellfish Allergy ] Anaphylaxis and Swelling   Sulfasalazine Rash   Sulfa Antibiotics Rash   Medications:  Current Outpatient Medications:    amphetamine-dextroamphetamine (ADDERALL XR) 25 MG 24 hr capsule, Take 25 mg by mouth daily.,  Disp: , Rfl:    budesonide  (PULMICORT ) 1 MG/2ML nebulizer solution, Take 2 mLs (1 mg total) by nebulization in the morning and at bedtime., Disp: 60 mL, Rfl: 0   busPIRone (BUSPAR) 10 MG tablet,  Take 10 mg by mouth in the morning and at bedtime., Disp: , Rfl:    diphenhydrAMINE  (BENADRYL ) 50 MG tablet, Take 50 mg by mouth at bedtime as needed., Disp: , Rfl:    doxepin  (SINEQUAN ) 50 MG capsule, Take 50 mg by mouth daily., Disp: , Rfl:    EPINEPHrine  0.3 mg/0.3 mL IJ SOAJ injection, Inject 0.3 mg into the muscle as needed for anaphylaxis., Disp: 2 each, Rfl: 1   fexofenadine  (ALLEGRA ) 180 MG tablet, TAKE 1 TABLET BY MOUTH EVERY DAY, Disp: 90 tablet, Rfl: 1   gabapentin (NEURONTIN) 600 MG tablet, Take 1,800 mg by mouth daily., Disp: , Rfl:    lithium  600 MG capsule, Take 600 mg by mouth at bedtime., Disp: , Rfl:    lithium  carbonate 300 MG capsule, Take 300 mg by mouth daily., Disp: , Rfl:    medroxyPROGESTERone  (DEPO-PROVERA ) 150 MG/ML injection, Inject 1 mL (150 mg total) into the muscle every 3 (three) months., Disp: 1 mL, Rfl: 0   megestrol  (MEGACE ) 40 MG tablet, TAKE 3 TABS X 5 DAYS, 2 TABS X 5 DAYS, 1 TAB DAILY TO CONTROL VAG BLEEDING, STOP WHEN BLEEDING STOPS, (Patient taking differently: as needed. TAKE 3 TABS X 5 DAYS, 2 TABS X 5 DAYS, 1 TAB DAILY TO CONTROL VAG BLEEDING, STOP WHEN BLEEDING STOPS,), Disp: 45 tablet, Rfl: 0   ondansetron  (ZOFRAN ) 4 MG tablet, TAKE 1 TABLET BY MOUTH EVERY 8 HOURS AS NEEDED FOR NAUSEA AND VOMITING, Disp: 30 tablet, Rfl: 0   traZODone (DESYREL) 100 MG tablet, Take 100 mg by mouth at bedtime., Disp: , Rfl:    tretinoin (RETIN-A) 0.025 % cream, Apply 1 Application topically at bedtime., Disp: , Rfl:    valACYclovir  (VALTREX ) 1000 MG tablet, Take 2 grams twice daily x 1 day for outbreaks as needed (Patient taking differently: as needed. Take 2 grams twice daily x 1 day for outbreaks as needed), Disp: 40 tablet, Rfl: 1   venlafaxine XR (EFFEXOR-XR) 150 MG 24 hr capsule, Take 150 mg by mouth daily., Disp: , Rfl:    venlafaxine XR (EFFEXOR-XR) 75 MG 24 hr capsule, Take by mouth., Disp: , Rfl:   Observations/Objective: Patient is well-developed, well-nourished  in no acute distress.  Resting comfortably  at home.  Head is normocephalic, atraumatic.  No labored breathing.  Speech is clear and coherent with logical content.  Patient is alert and oriented at baseline.   Assessment and Plan: Assessment and Plan Assessment & Plan Chronic left-sided headaches with recent severe exacerbation Chronic headaches behind the left eye with recent severe exacerbation. CT scan unremarkable. Movement exacerbates pain. High caffeine  intake, poor hydration, elevated stress. Family history of seizure disorder. Differential includes migraine. MRI recommended to rule out structural causes. - Order MRI of the head. - Refer to neurology for further evaluation. - Encourage reaching out to psychiatrist to discuss headaches and potential medication adjustments. - Consider Excedrin migraine for symptomatic relief.  Major depressive disorder with suicidal ideation Increased frequency of depression and intensified suicidal ideations. Current medication may be less effective. High stress levels. - Encourage contacting psychiatrist for medication review and management of depression and suicidal ideation. - I discussed with patient that if they develop any SI, to tell someone immediately and seek medical attention.  Follow Up Instructions: I discussed the assessment and treatment plan with the patient. The patient was provided an opportunity to ask questions and all were answered. The patient agreed with the plan and demonstrated an understanding of the instructions.  A copy of instructions were sent to the patient via MyChart unless otherwise noted below.   The patient was advised to call back or seek an in-person evaluation if the symptoms worsen or if the condition fails to improve as anticipated.  [name], [place]

## 2024-04-14 NOTE — Progress Notes (Signed)
 Error

## 2024-04-22 ENCOUNTER — Other Ambulatory Visit: Payer: Self-pay | Admitting: Medical Genetics

## 2024-04-28 ENCOUNTER — Ambulatory Visit (INDEPENDENT_AMBULATORY_CARE_PROVIDER_SITE_OTHER): Admitting: Physician Assistant

## 2024-04-28 ENCOUNTER — Other Ambulatory Visit (HOSPITAL_COMMUNITY)

## 2024-04-28 ENCOUNTER — Encounter: Payer: Self-pay | Admitting: Physician Assistant

## 2024-04-28 VITALS — BP 130/86 | HR 121 | Temp 98.1°F | Ht 67.5 in | Wt 183.2 lb

## 2024-04-28 DIAGNOSIS — E663 Overweight: Secondary | ICD-10-CM

## 2024-04-28 DIAGNOSIS — J029 Acute pharyngitis, unspecified: Secondary | ICD-10-CM

## 2024-04-28 LAB — POCT RAPID STREP A (OFFICE): Rapid Strep A Screen: NEGATIVE

## 2024-04-28 MED ORDER — LIDOCAINE VISCOUS HCL 2 % MT SOLN: 15.0000 mL | OROMUCOSAL | 1 refills | Status: DC | PRN

## 2024-04-28 MED ORDER — TIRZEPATIDE-WEIGHT MANAGEMENT 10 MG/0.5ML ~~LOC~~ SOLN
10.0000 mg | SUBCUTANEOUS | 1 refills | Status: DC
Start: 2024-04-28 — End: 2024-07-03

## 2024-04-28 MED ORDER — TIRZEPATIDE-WEIGHT MANAGEMENT 5 MG/0.5ML ~~LOC~~ SOLN: 5.0000 mg | SUBCUTANEOUS | 2 refills | Status: DC

## 2024-04-28 NOTE — Addendum Note (Signed)
 Addended by: Sherlock Nancarrow J on: 04/28/2024 04:26 PM   Modules accepted: Orders

## 2024-04-28 NOTE — Progress Notes (Addendum)
 Kathy Zimmerman is a 33 y.o. female here for a new problem.  History of Present Illness:   Chief Complaint  Patient presents with   Sore Throat    Pt c/o sore throat, started 2 days ago, low grade temp.   Discussed the use of AI scribe software for clinical note transcription with the patient, who gave verbal consent to proceed.  History of Present Illness Kathy Zimmerman is a 32 year old female who presents with sore throat and low-grade fever.  She has experienced a low-grade fever of 62F, sore throat, dry throat, and body aches since Friday after returning from a trip to [facility]. Her throat pain has worsened and is severe, impacting her ability to eat, although she continues to eat despite the pain. She has been taking Tylenol  and ibuprofen  with minimal relief. Examination reveals spots on the roof of her mouth. 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 She reports a little soreness in her ears but no significant ear pain. No unusual diarrhea.  She would like to trial Zepbound  for weight loss. She has been on GLP-1 in the past and tolerated well. She is exercising regularly and eating healthy portions.    Past Medical History:  Diagnosis Date   Anxiety    Asthma    Bipolar disorder (HCC)    Depression    Headache    IBS (irritable bowel syndrome)    Panic attack    Preeclampsia    PTSD (post-traumatic stress disorder)      Social History   Tobacco Use   Smoking status: Never    Passive exposure: Never   Smokeless tobacco: Never  Vaping Use   Vaping status: Never Used  Substance Use Topics   Alcohol use: No   Drug use: No    Past Surgical History:  Procedure Laterality Date   CESAREAN SECTION N/A 04/10/2015   Procedure: CESAREAN SECTION;  Surgeon: Ovid All, MD;  Location: [facility];  Service: Obstetrics;  Laterality: N/A;   EYE SURGERY     laser   SCLERAL BUCKLE  08/09/2011    Procedure: SCLERAL BUCKLE;  Surgeon: Arley LABOR Rankin;  Location: [facility];  Service: Ophthalmology;  Laterality: Left;  with cryo   TENDON REPAIR     R middle finger    Family History  Problem Relation Age of Onset   Allergic rhinitis Mother    Eczema Father    Asthma Father    Allergic rhinitis Father    Depression Father    Depression Sister    Mental illness Sister    Depression Maternal Grandmother    Mental illness Maternal Grandmother    Thyroid  disease Maternal Grandmother    Heart disease Maternal Grandfather    Hypertension Maternal Grandfather    Hyperlipidemia Maternal Grandfather    CAD Maternal Grandfather    Thyroid  disease Other    CAD Other    Cancer Neg Hx     Allergies  Allergen Reactions   Cat Dander Anaphylaxis   Shrimp [Shellfish Allergy ] Anaphylaxis and Swelling   Sulfasalazine Rash   Sulfa Antibiotics Rash    Current Medications:   Current Outpatient Medications:    amphetamine-dextroamphetamine (ADDERALL XR) 25 MG 24 hr capsule, Take 25 mg by mouth daily., Disp: , Rfl:    budesonide  (PULMICORT ) 1 MG/2ML nebulizer solution, Take 2 mLs (1 mg total) by nebulization in the morning and at bedtime., Disp:  60 mL, Rfl: 0   busPIRone (BUSPAR) 10 MG tablet, Take 10 mg by mouth in the morning and at bedtime., Disp: , Rfl:    diphenhydrAMINE  (BENADRYL ) 50 MG tablet, Take 50 mg by mouth at bedtime as needed., Disp: , Rfl:    doxepin  (SINEQUAN ) 50 MG capsule, Take 50 mg by mouth daily., Disp: , Rfl:    EPINEPHrine  0.3 mg/0.3 mL IJ SOAJ injection, Inject 0.3 mg into the muscle as needed for anaphylaxis., Disp: 2 each, Rfl: 1   fexofenadine  (ALLEGRA ) 180 MG tablet, TAKE 1 TABLET BY MOUTH EVERY DAY, Disp: 90 tablet, Rfl: 1   gabapentin (NEURONTIN) 600 MG tablet, Take 1,800 mg by mouth daily., Disp: , Rfl:    lithium  600 MG capsule, Take 600 mg by mouth at bedtime., Disp: , Rfl:    lithium  carbonate 300 MG capsule, Take 300 mg by mouth daily., Disp: , Rfl:     medroxyPROGESTERone  (DEPO-PROVERA ) 150 MG/ML injection, Inject 1 mL (150 mg total) into the muscle every 3 (three) months., Disp: 1 mL, Rfl: 0   megestrol  (MEGACE ) 40 MG tablet, TAKE 3 TABS X 5 DAYS, 2 TABS X 5 DAYS, 1 TAB DAILY TO CONTROL VAG BLEEDING, STOP WHEN BLEEDING STOPS, (Patient taking differently: as needed. TAKE 3 TABS X 5 DAYS, 2 TABS X 5 DAYS, 1 TAB DAILY TO CONTROL VAG BLEEDING, STOP WHEN BLEEDING STOPS,), Disp: 45 tablet, Rfl: 0   ondansetron  (ZOFRAN ) 4 MG tablet, TAKE 1 TABLET BY MOUTH EVERY 8 HOURS AS NEEDED FOR NAUSEA AND VOMITING, Disp: 30 tablet, Rfl: 0   tretinoin (RETIN-A) 0.025 % cream, Apply 1 Application topically at bedtime., Disp: , Rfl:    valACYclovir  (VALTREX ) 1000 MG tablet, Take 2 grams twice daily x 1 day for outbreaks as needed (Patient taking differently: as needed. Take 2 grams twice daily x 1 day for outbreaks as needed), Disp: 40 tablet, Rfl: 1   venlafaxine XR (EFFEXOR-XR) 150 MG 24 hr capsule, Take 150 mg by mouth daily., Disp: , Rfl:    venlafaxine XR (EFFEXOR-XR) 75 MG 24 hr capsule, Take by mouth., Disp: , Rfl:    lidocaine  (XYLOCAINE ) 2 % solution, Use as directed 15 mLs in the mouth or throat as needed for mouth pain., Disp: 100 mL, Rfl: 1   tirzepatide  5 MG/0.5ML injection vial, Inject 5 mg into the skin once a week., Disp: 2 mL, Rfl: 2   Review of Systems:   Negative unless otherwise specified per HPI.  Vitals:   Vitals:   04/28/24 0947  BP: 130/86  Pulse: (!) 121  Temp: 98.1 F (36.7 C)  TempSrc: Temporal  SpO2: 98%  Weight: 183 lb 4 oz (83.1 kg)  Height: 5' 7.5 (1.715 m)     Body mass index is 28.28 kg/m.  Physical Exam:   Physical Exam Vitals and nursing note reviewed.  Constitutional:      General: She is not in acute distress.    Appearance: She is well-developed. She is not ill-appearing or toxic-appearing.  HENT:     Mouth/Throat:     Lips: Pink.     Mouth: Mucous membranes are moist.     Palate: Lesions present.      Pharynx: Oropharynx is clear. Posterior oropharyngeal erythema present.  Cardiovascular:     Rate and Rhythm: Normal rate and regular rhythm.     Pulses: Normal pulses.     Heart sounds: Normal heart sounds, S1 normal and S2 normal.  Pulmonary:  Effort: Pulmonary effort is normal.     Breath sounds: Normal breath sounds.  Skin:    General: Skin is warm and dry.  Neurological:     Mental Status: She is alert.     GCS: GCS eye subscore is 4. GCS verbal subscore is 5. GCS motor subscore is 6.  Psychiatric:        Speech: Speech normal.        Behavior: Behavior normal. Behavior is cooperative.     Assessment and Plan:   Assessment and Plan Assessment & Plan Hand, foot, and mouth disease Suspected due to painful oral lesions and severe throat pain. Highly contagious with symptoms of low-grade fever, sore throat, and body aches. Oral lesions suggest this over acute pharyngitis. - Prescribed lidocaine  mouthwash for pain relief. - Advised continuation of acetaminophen  and ibuprofen . - Recommended staying home to prevent spreading infection. - Advised rescheduling work tasks to be done from home if possible.  Overweight She is considering weight management medication. Discussed financial considerations and insurance coverage. Medicaid coverage for obesity medications ends October 1st. Plans to adjust spending to afford medication. - Sent prescription for weight management medication to [facility]. - Will send in Zepbound  10 mg weekly for her to start     Bryony Kaman, PA-C

## 2024-04-29 ENCOUNTER — Ambulatory Visit: Admitting: Physician Assistant

## 2024-04-29 ENCOUNTER — Encounter: Payer: Self-pay | Admitting: Neurology

## 2024-04-30 ENCOUNTER — Ambulatory Visit: Admitting: Physician Assistant

## 2024-05-02 ENCOUNTER — Other Ambulatory Visit (HOSPITAL_COMMUNITY)

## 2024-05-07 ENCOUNTER — Ambulatory Visit: Admitting: Physician Assistant

## 2024-05-07 ENCOUNTER — Encounter: Payer: Self-pay | Admitting: Physician Assistant

## 2024-05-07 VITALS — BP 102/70 | HR 96 | Temp 98.2°F | Ht 67.5 in | Wt 182.2 lb

## 2024-05-07 DIAGNOSIS — F341 Dysthymic disorder: Secondary | ICD-10-CM

## 2024-05-07 DIAGNOSIS — F411 Generalized anxiety disorder: Secondary | ICD-10-CM

## 2024-05-07 DIAGNOSIS — Z7184 Encounter for health counseling related to travel: Secondary | ICD-10-CM

## 2024-05-07 DIAGNOSIS — F3162 Bipolar disorder, current episode mixed, moderate: Secondary | ICD-10-CM

## 2024-05-07 MED ORDER — ONDANSETRON HCL 8 MG PO TABS: 8.0000 mg | ORAL_TABLET | Freq: Three times a day (TID) | ORAL | 0 refills | Status: AC | PRN

## 2024-05-07 MED ORDER — RIFAXIMIN 550 MG PO TABS: 550.0000 mg | ORAL_TABLET | Freq: Every day | ORAL | 0 refills | Status: AC

## 2024-05-07 NOTE — Patient Instructions (Signed)
 It was great to see you!  Xifaxin for travelers diarrhea:  200 mg --> take 1 tablet daily during travel (there are two additional tablets that are leftover)  Take care,  [name] PA-C

## 2024-05-07 NOTE — Progress Notes (Signed)
 Kathy Zimmerman is a 33 y.o. female here for a follow up of a pre-existing problem.  History of Present Illness:   Chief Complaint  Patient presents with   Gene testing    Pt here for Gene testing today.    Anxiety/Depression/Bipolar Disorder Currently seeing Dr. Shirline She is currently taking Adderall XR 25 mg daily, BuSpar 10 mg nightly, doxepin  50 mg daily, 1800 mg gabapentin daily, 600 mg lithium , Effexor to 25 mg daily She is wanting to have gene testing done for evaluation of best medications for her to trial she has had issues with tolerance and success with past medications  Travel advice encounter She is hoping to go on a mission trip soon and will be getting travel vaccines from [facility] She is wanting to have a refill of her Zofran  to help with nausea as she often has this especially when traveling She also is concerned about traveler's diarrhea  Past Medical History:  Diagnosis Date   Anxiety    Asthma    Bipolar disorder (HCC)    Depression    Headache    IBS (irritable bowel syndrome)    Panic attack    Preeclampsia    PTSD (post-traumatic stress disorder)      Social History   Tobacco Use   Smoking status: Never    Passive exposure: Never   Smokeless tobacco: Never  Vaping Use   Vaping status: Never Used  Substance Use Topics   Alcohol use: No   Drug use: No    Past Surgical History:  Procedure Laterality Date   CESAREAN SECTION N/A 04/10/2015   Procedure: CESAREAN SECTION;  Surgeon: Ovid All, MD;  Location: [facility];  Service: Obstetrics;  Laterality: N/A;   EYE SURGERY     laser   SCLERAL BUCKLE  08/09/2011   Procedure: SCLERAL BUCKLE;  Surgeon: Arley LABOR Rankin;  Location: [facility];  Service: Ophthalmology;  Laterality: Left;  with cryo   TENDON REPAIR     R middle finger    Family History  Problem Relation Age of Onset   Allergic rhinitis Mother    Eczema Father    Asthma Father    Allergic rhinitis Father    Depression Father     Depression Sister    Mental illness Sister    Depression Maternal Grandmother    Mental illness Maternal Grandmother    Thyroid  disease Maternal Grandmother    Heart disease Maternal Grandfather    Hypertension Maternal Grandfather    Hyperlipidemia Maternal Grandfather    CAD Maternal Grandfather    Thyroid  disease Other    CAD Other    Cancer Neg Hx     Allergies  Allergen Reactions   Cat Dander Anaphylaxis   Shrimp [Shellfish Allergy ] Anaphylaxis and Swelling   Sulfasalazine Rash   Sulfa Antibiotics Rash    Current Medications:   Current Outpatient Medications:    amphetamine-dextroamphetamine (ADDERALL XR) 25 MG 24 hr capsule, Take 25 mg by mouth daily., Disp: , Rfl:    busPIRone (BUSPAR) 10 MG tablet, Take 10 mg by mouth in the morning and at bedtime., Disp: , Rfl:    diphenhydrAMINE  (BENADRYL ) 50 MG tablet, Take 50 mg by mouth at bedtime as needed., Disp: , Rfl:    doxepin  (SINEQUAN ) 50 MG capsule, Take 50 mg by mouth daily., Disp: , Rfl:    EPINEPHrine  0.3 mg/0.3 mL IJ SOAJ injection, Inject 0.3 mg into the muscle as needed for anaphylaxis., Disp: 2 each, Rfl:  1   gabapentin (NEURONTIN) 600 MG tablet, Take 1,800 mg by mouth daily., Disp: , Rfl:    lithium  600 MG capsule, Take 600 mg by mouth at bedtime., Disp: , Rfl:    medroxyPROGESTERone  (DEPO-PROVERA ) 150 MG/ML injection, Inject 1 mL (150 mg total) into the muscle every 3 (three) months., Disp: 1 mL, Rfl: 0   megestrol  (MEGACE ) 40 MG tablet, TAKE 3 TABS X 5 DAYS, 2 TABS X 5 DAYS, 1 TAB DAILY TO CONTROL VAG BLEEDING, STOP WHEN BLEEDING STOPS, (Patient taking differently: as needed. TAKE 3 TABS X 5 DAYS, 2 TABS X 5 DAYS, 1 TAB DAILY TO CONTROL VAG BLEEDING, STOP WHEN BLEEDING STOPS,), Disp: 45 tablet, Rfl: 0   ondansetron  (ZOFRAN ) 8 MG tablet, Take 1 tablet (8 mg total) by mouth every 8 (eight) hours as needed for nausea or vomiting., Disp: 20 tablet, Rfl: 0   rifaximin  (XIFAXAN ) 550 MG TABS tablet, Take 1 tablet (550 mg  total) by mouth daily in the afternoon., Disp: 9 tablet, Rfl: 0   tirzepatide  10 MG/0.5ML injection vial, Inject 10 mg into the skin once a week., Disp: 2 mL, Rfl: 1   tretinoin (RETIN-A) 0.025 % cream, Apply 1 Application topically at bedtime., Disp: , Rfl:    venlafaxine XR (EFFEXOR-XR) 150 MG 24 hr capsule, Take 150 mg by mouth daily., Disp: , Rfl:    venlafaxine XR (EFFEXOR-XR) 75 MG 24 hr capsule, Take by mouth., Disp: , Rfl:    Review of Systems:   Negative unless otherwise specified per HPI.  Vitals:   Vitals:   05/07/24 0826  BP: 102/70  Pulse: 96  Temp: 98.2 F (36.8 C)  TempSrc: Temporal  SpO2: 98%  Weight: 182 lb 4 oz (82.7 kg)  Height: 5' 7.5 (1.715 m)     Body mass index is 28.12 kg/m.  Physical Exam:   Physical Exam Constitutional:      Appearance: Normal appearance. She is well-developed.  HENT:     Head: Normocephalic and atraumatic.  Eyes:     General: Lids are normal.     Extraocular Movements: Extraocular movements intact.     Conjunctiva/sclera: Conjunctivae normal.  Pulmonary:     Effort: Pulmonary effort is normal.  Musculoskeletal:        General: Normal range of motion.     Cervical back: Normal range of motion and neck supple.  Skin:    General: Skin is warm and dry.  Neurological:     Mental Status: She is alert and oriented to person, place, and time.  Psychiatric:        Attention and Perception: Attention and perception normal.        Mood and Affect: Mood normal.        Behavior: Behavior normal.        Thought Content: Thought content normal.        Judgment: Judgment normal.     Assessment and Plan:   GAD (generalized anxiety disorder); Persistent depressive disorder;  Mixed bipolar affective disorder, moderate (HCC) Ongoing management with psychiatry Will order GeneSight testing today Will send results to Dr. Shirline when they have returned  Travel advice encounter I have given her Zofran  to use as needed She will see the  travel clinic at [facility] for vaccines per her preference I have also given her samples of Xifaxan  with instructions to take 550 mg daily during her trip    [name], PA-C

## 2024-05-13 DIAGNOSIS — F603 Borderline personality disorder: Secondary | ICD-10-CM | POA: Diagnosis not present

## 2024-05-13 DIAGNOSIS — F4312 Post-traumatic stress disorder, chronic: Secondary | ICD-10-CM | POA: Diagnosis not present

## 2024-05-13 DIAGNOSIS — F3162 Bipolar disorder, current episode mixed, moderate: Secondary | ICD-10-CM | POA: Diagnosis not present

## 2024-05-14 ENCOUNTER — Encounter: Payer: Self-pay | Admitting: Physician Assistant

## 2024-05-15 ENCOUNTER — Other Ambulatory Visit: Payer: Self-pay | Admitting: Physician Assistant

## 2024-05-15 MED ORDER — TYPHOID VACCINE PO CPDR: DELAYED_RELEASE_CAPSULE | ORAL | 0 refills | Status: DC

## 2024-05-15 MED ORDER — YELLOW FEVER VACCINE ~~LOC~~ INJ: 0.5000 mL | INJECTION | Freq: Once | SUBCUTANEOUS | 0 refills | Status: AC

## 2024-05-27 ENCOUNTER — Encounter: Payer: Self-pay | Admitting: Pulmonary Disease

## 2024-05-27 ENCOUNTER — Ambulatory Visit: Admitting: Pulmonary Disease

## 2024-05-27 VITALS — BP 121/84 | HR 77 | Ht 67.5 in | Wt 180.0 lb

## 2024-05-27 DIAGNOSIS — R0683 Snoring: Secondary | ICD-10-CM

## 2024-05-27 DIAGNOSIS — G4719 Other hypersomnia: Secondary | ICD-10-CM

## 2024-05-27 NOTE — Progress Notes (Signed)
 Kathy Zimmerman    [id number]    05-28-1991  Primary Care Physician:Worley, Lucie, PA  Referring Physician: Job Lucie, PA [address] [place],  [place] [address]  Chief complaint:   Patient being seen for abnormal breathing during sleep  HPI:  She was told by her mom recently that was she was breathing infrequently at night  History of snoring  Admits to dryness of her mouth in the mornings Morning headaches Sweaty at night Memory is good Focuses good  Has a history of anxiety/depression  Usually tries to go to bed about 10 PM sometimes able to fall asleep easily Final wake up time about 6 AM She wakes up not feeling restored  Never smoker Does not use alcohol No caffeinated beverages  Outpatient Encounter Medications as of 05/27/2024  Medication Sig   amphetamine-dextroamphetamine (ADDERALL XR) 25 MG 24 hr capsule Take 25 mg by mouth daily.   busPIRone (BUSPAR) 10 MG tablet Take 10 mg by mouth in the morning and at bedtime.   diphenhydrAMINE  (BENADRYL ) 50 MG tablet Take 50 mg by mouth at bedtime as needed.   doxepin  (SINEQUAN ) 50 MG capsule Take 50 mg by mouth daily.   EPINEPHrine  0.3 mg/0.3 mL IJ SOAJ injection Inject 0.3 mg into the muscle as needed for anaphylaxis.   gabapentin (NEURONTIN) 600 MG tablet Take 1,800 mg by mouth daily.   lithium  600 MG capsule Take 600 mg by mouth at bedtime.   medroxyPROGESTERone  (DEPO-PROVERA ) 150 MG/ML injection Inject 1 mL (150 mg total) into the muscle every 3 (three) months.   megestrol  (MEGACE ) 40 MG tablet TAKE 3 TABS X 5 DAYS, 2 TABS X 5 DAYS, 1 TAB DAILY TO CONTROL VAG BLEEDING, STOP WHEN BLEEDING STOPS, (Patient taking differently: as needed. TAKE 3 TABS X 5 DAYS, 2 TABS X 5 DAYS, 1 TAB DAILY TO CONTROL VAG BLEEDING, STOP WHEN BLEEDING STOPS,)   ondansetron  (ZOFRAN ) 8 MG tablet Take 1 tablet (8 mg total) by mouth every 8 (eight) hours as needed for nausea or vomiting.   rifaximin  (XIFAXAN ) 550 MG  TABS tablet Take 1 tablet (550 mg total) by mouth daily in the afternoon.   tirzepatide  10 MG/0.5ML injection vial Inject 10 mg into the skin once a week.   tretinoin (RETIN-A) 0.025 % cream Apply 1 Application topically at bedtime.   typhoid (VIVOTIF) DR capsule Take one capsule on alternate days (day 1, 3, 5, and 7) for a total of 4 doses; all doses should be complete at least 1 week prior to potential exposure.   venlafaxine XR (EFFEXOR-XR) 150 MG 24 hr capsule Take 150 mg by mouth daily.   venlafaxine XR (EFFEXOR-XR) 75 MG 24 hr capsule Take by mouth.   No facility-administered encounter medications on file as of 05/27/2024.    Allergies as of 05/27/2024 - Review Complete 05/27/2024  Allergen Reaction Noted   Cat dander Anaphylaxis 10/14/2018   Shrimp [shellfish allergy ] Anaphylaxis and Swelling 06/23/2017   Sulfasalazine Rash 08/08/2011   Sulfa antibiotics Rash 08/08/2011    Past Medical History:  Diagnosis Date   Anxiety    Asthma    Bipolar disorder (HCC)    Depression    Headache    IBS (irritable bowel syndrome)    Panic attack    Preeclampsia    PTSD (post-traumatic stress disorder)     Past Surgical History:  Procedure Laterality Date   CESAREAN SECTION N/A 04/10/2015   Procedure: CESAREAN SECTION;  Surgeon: Ovid All, MD;  Location: [facility];  Service: Obstetrics;  Laterality: N/A;   EYE SURGERY     laser   SCLERAL BUCKLE  08/09/2011   Procedure: SCLERAL BUCKLE;  Surgeon: Arley LABOR Rankin;  Location: [facility];  Service: Ophthalmology;  Laterality: Left;  with cryo   TENDON REPAIR     R middle finger    Family History  Problem Relation Age of Onset   Allergic rhinitis Mother    Eczema Father    Asthma Father    Allergic rhinitis Father    Depression Father    Depression Sister    Mental illness Sister    Depression Maternal Grandmother    Mental illness Maternal Grandmother    Thyroid  disease Maternal Grandmother    Heart disease Maternal Grandfather     Hypertension Maternal Grandfather    Hyperlipidemia Maternal Grandfather    CAD Maternal Grandfather    Thyroid  disease Other    CAD Other    Cancer Neg Hx     Social History   Socioeconomic History   Marital status: Legally Separated    Spouse name: Not on file   Number of children: 2   Years of education: Not on file   Highest education level: Not on file  Occupational History   Not on file  Tobacco Use   Smoking status: Never    Passive exposure: Never   Smokeless tobacco: Never  Vaping Use   Vaping status: Never Used  Substance and Sexual Activity   Alcohol use: No   Drug use: No   Sexual activity: Not Currently    Partners: Male    Birth control/protection: Injection  Other Topics Concern   Not on file  Social History Narrative   Moved to [place]   61 and 32 year old boys (2021)   7 years married   In a relationship   Social Drivers of Health   Financial Resource Strain: High Risk (10/14/2018)   Received from [facility]   Overall Financial Resource Strain (CARDIA)    Difficulty of Paying Living Expenses: Very hard  Food Insecurity: Food Insecurity Present (10/14/2018)   Received [facility]   Hunger Vital Sign    Within the past 12 months, you worried that your food would run out before you got the money to buy more.: Sometimes true    Within the past 12 months, the food you bought just didn't last and you didn't have money to get more.: Sometimes true  Transportation Needs: No Transportation Needs (10/14/2018)   Received from [facility] - Transportation    Lack of Transportation (Medical): No    Lack of Transportation (Non-Medical): No  Physical Activity: Inactive (10/14/2018)   Received from [facility]   Exercise Vital Sign    On average, how many days per week do you engage in moderate to strenuous exercise (like a brisk walk)?: 0 days    On average, how many minutes do you engage in exercise at this level?: 0 min  Stress: Stress  Concern Present (10/14/2018)   Received from [facility] - Occupational Stress Questionnaire    Feeling of Stress : Very much  Social Connections: Unknown (03/21/2022)   Received from [facility]   Social Network    Social Network: Not on file  Intimate Partner Violence: Unknown (03/21/2022)   Received from [facility]   HITS    Physically Hurt: Not  on file    Insult or Talk Down To: Not on file    Threaten Physical Harm: Not on file    Scream or Curse: Not on file    Review of Systems  Respiratory:  Positive for apnea.   Psychiatric/Behavioral:  Positive for sleep disturbance.     Vitals:   05/27/24 1454  BP: 121/84  Pulse: 77  SpO2: 99%     Physical Exam Constitutional:      Appearance: She is obese.  HENT:     Head: Normocephalic.     Mouth/Throat:     Mouth: Mucous membranes are moist.  Eyes:     General: No scleral icterus. Cardiovascular:     Rate and Rhythm: Normal rate and regular rhythm.     Heart sounds: No murmur heard.    No friction rub.  Pulmonary:     Effort: No respiratory distress.     Breath sounds: No stridor. No wheezing or rhonchi.  Musculoskeletal:     Cervical back: No rigidity or tenderness.  Neurological:     Mental Status: She is alert.  Psychiatric:        Mood and Affect: Mood normal.     Epworth Sleepiness Scale of 8  Data Reviewed: 33 year old lady with history of mood disorder, asthma that is well-controlled, Concern for sleep disordered breathing, noted to have apneic episodes  Pathophysiology of sleep disordered breathing was discussed with the patient  Treatment options discussed with the patient  Daytime sleepiness Nonrestorative sleep  Asthma  History of PTSD Bipolar disorder Generalized anxiety disorder  Assessment/Plan: Will go ahead and schedule for home sleep study  Weight loss efforts encouraged  Encouraged to call with significant concerns  Tentative  follow-up in about 3 months  Jennet Epley MD [facility] 05/27/2024, 2:59 PM  CC: Job Lukes, PA

## 2024-05-27 NOTE — Patient Instructions (Signed)
 I will see you in about 3 months  We will schedule you for a home sleep study  We will advise you with results as soon as reviewed  Continue weight loss efforts  Call us  with significant concerns

## 2024-05-28 ENCOUNTER — Other Ambulatory Visit (INDEPENDENT_AMBULATORY_CARE_PROVIDER_SITE_OTHER)

## 2024-05-28 ENCOUNTER — Ambulatory Visit: Admitting: Gastroenterology

## 2024-05-28 ENCOUNTER — Encounter: Payer: Self-pay | Admitting: Gastroenterology

## 2024-05-28 VITALS — BP 106/70 | HR 98 | Ht 67.5 in | Wt 178.6 lb

## 2024-05-28 DIAGNOSIS — R194 Change in bowel habit: Secondary | ICD-10-CM

## 2024-05-28 DIAGNOSIS — Z83719 Family history of colon polyps, unspecified: Secondary | ICD-10-CM

## 2024-05-28 DIAGNOSIS — R152 Fecal urgency: Secondary | ICD-10-CM | POA: Diagnosis not present

## 2024-05-28 DIAGNOSIS — K529 Noninfective gastroenteritis and colitis, unspecified: Secondary | ICD-10-CM

## 2024-05-28 DIAGNOSIS — K219 Gastro-esophageal reflux disease without esophagitis: Secondary | ICD-10-CM | POA: Diagnosis not present

## 2024-05-28 DIAGNOSIS — R1319 Other dysphagia: Secondary | ICD-10-CM | POA: Diagnosis not present

## 2024-05-28 LAB — C-REACTIVE PROTEIN: CRP: <1 mg/dL (ref 0.5–20.0)

## 2024-05-28 LAB — SEDIMENTATION RATE: Sed Rate: 9 mm/h (ref 0–20)

## 2024-05-28 MED ORDER — SUTAB 1479-225-188 MG PO TABS: 24.0000 | ORAL_TABLET | ORAL | 0 refills | Status: AC

## 2024-05-28 MED ORDER — OMEPRAZOLE 40 MG PO CPDR: 40.0000 mg | DELAYED_RELEASE_CAPSULE | Freq: Every day | ORAL | 3 refills | Status: DC

## 2024-05-28 NOTE — Progress Notes (Signed)
 Kathy Zimmerman [id number] 08-14-1991   Chief Complaint: Diarrhea, urgency  Referring Provider: Job Lukes, PA Primary GI MD: Sampson  HPI: Kathy Zimmerman is a 33 y.o. female with past medical history of anxiety/depression, bipolar disorder, asthma, IBS, panic disorder, preeclampsia, PTSD, prior C-section who presents today for a complaint of change in bowel habits.    Labs 03/19/2024: Normal CBC, normal CMP, elevated triglycerides/cholesterol, normal TSH, hemoglobin A1c 5.3   Patient states she has been having frequent loose stools over the last few years.  Sometimes has associated fecal urgency, but this comes and goes.  Sometimes feels the need to rush to the bathroom immediately after eating, but not always.  Reports she had testing for parasites in the past which was negative.  Sometimes does have formed stools but this is rare.  Denies any blood in her stool or melena, though she does not really look.  Rarely has pain with bowel movements.  On average has 3 loose stools daily, up to 4 loose stools daily.  Does not have sensation of incomplete evacuation, though sometimes has to go back to the bathroom after 5 minutes.  She denies abdominal pain, vomiting, fever, chills.  Has occasional nausea.  Denies any unintentional weight loss.  She denies any known family history of colon cancer or IBD.  Does have family history of colon polyps in her mother, though is unsure whether these were precancerous or not.  She does consume dairy regularly, has not tried dairy elimination yet.  Admits she does not drink enough water.  Possibly not getting enough fiber in her diet.  Denies history of diabetes or pancreatitis.  Still has her gallbladder.  Prior to onset of loose stools was having a normal, formed bowel movement daily.  She is unable to identify whether any medication changes or lifestyle changes led to onset of diarrhea.  She has tried Imodium  in the past but did not find  this very helpful.  Xifaxan  is in her medication list because she is going to [place] this year on a mission trip and is concerned about traveler's diarrhea and wanted to have it on hand.  She also reports frequent acid reflux and heartburn, which occurs daily.  She is taking OTC omeprazole  as needed, after symptoms occur, and still having symptoms daily.  Has noticed worsening GERD over the last year.  Experiences burning in her chest and throat.  Also has noticed dysphagia to solids in the last year which occurs fairly frequently.  Feels food gets stuck in her throat, though it eventually goes down and she is not having to regurgitate.  Denies any history of smoking.  Denies alcohol use.  Denies family history of esophageal or stomach cancer.  Denies heart or lung problems.  Not on a blood thinner.  Denies history of MI/stroke.  Previous GI Procedures/Imaging   CT A/P 06/09/2020 Two areas left lower lobe rounded airspace opacities concerning for areas of pneumonia.   No acute intra-abdominal or pelvic finding.  Past Medical History:  Diagnosis Date   Anxiety    Asthma    Bipolar disorder (HCC)    Depression    Headache    IBS (irritable bowel syndrome)    Panic attack    Preeclampsia    PTSD (post-traumatic stress disorder)     Past Surgical History:  Procedure Laterality Date   CESAREAN SECTION N/A 04/10/2015   Procedure: CESAREAN SECTION;  Surgeon: Ovid All, MD;  Location: [facility];  Service: Obstetrics;  Laterality: N/A;   EYE SURGERY     laser   SCLERAL BUCKLE  08/09/2011   Procedure: SCLERAL BUCKLE;  Surgeon: Arley LABOR Rankin;  Location: [facility] OR;  Service: Ophthalmology;  Laterality: Left;  with cryo   TENDON REPAIR     R middle finger    Current Outpatient Medications  Medication Sig Dispense Refill   amphetamine-dextroamphetamine (ADDERALL XR) 25 MG 24 hr capsule Take 25 mg by mouth daily.     busPIRone (BUSPAR) 10 MG tablet Take 10 mg by mouth in the morning and at  bedtime.     diphenhydrAMINE  (BENADRYL ) 50 MG tablet Take 50 mg by mouth at bedtime as needed.     doxepin  (SINEQUAN ) 50 MG capsule Take 50 mg by mouth daily.     EPINEPHrine  0.3 mg/0.3 mL IJ SOAJ injection Inject 0.3 mg into the muscle as needed for anaphylaxis. 2 each 1   gabapentin (NEURONTIN) 600 MG tablet Take 1,800 mg by mouth daily.     lithium  600 MG capsule Take 600 mg by mouth at bedtime.     medroxyPROGESTERone  (DEPO-PROVERA ) 150 MG/ML injection Inject 1 mL (150 mg total) into the muscle every 3 (three) months. 1 mL 0   megestrol  (MEGACE ) 40 MG tablet TAKE 3 TABS X 5 DAYS, 2 TABS X 5 DAYS, 1 TAB DAILY TO CONTROL VAG BLEEDING, STOP WHEN BLEEDING STOPS, (Patient taking differently: as needed. TAKE 3 TABS X 5 DAYS, 2 TABS X 5 DAYS, 1 TAB DAILY TO CONTROL VAG BLEEDING, STOP WHEN BLEEDING STOPS,) 45 tablet 0   ondansetron  (ZOFRAN ) 8 MG tablet Take 1 tablet (8 mg total) by mouth every 8 (eight) hours as needed for nausea or vomiting. 20 tablet 0   rifaximin  (XIFAXAN ) 550 MG TABS tablet Take 1 tablet (550 mg total) by mouth daily in the afternoon. 9 tablet 0   tirzepatide  10 MG/0.5ML injection vial Inject 10 mg into the skin once a week. 2 mL 1   tretinoin (RETIN-A) 0.025 % cream Apply 1 Application topically at bedtime.     typhoid (VIVOTIF) DR capsule Take one capsule on alternate days (day 1, 3, 5, and 7) for a total of 4 doses; all doses should be complete at least 1 week prior to potential exposure. 4 capsule 0   venlafaxine XR (EFFEXOR-XR) 150 MG 24 hr capsule Take 150 mg by mouth daily.     venlafaxine XR (EFFEXOR-XR) 75 MG 24 hr capsule Take by mouth.     No current facility-administered medications for this visit.    Allergies as of 05/28/2024 - Review Complete 05/28/2024  Allergen Reaction Noted   Cat dander Anaphylaxis 10/14/2018   Shrimp [shellfish allergy ] Anaphylaxis and Swelling 06/23/2017   Sulfasalazine Rash 08/08/2011   Sulfa antibiotics Rash 08/08/2011    Family  History  Problem Relation Age of Onset   Allergic rhinitis Mother    Eczema Father    Asthma Father    Allergic rhinitis Father    Depression Father    Depression Sister    Mental illness Sister    Depression Maternal Grandmother    Mental illness Maternal Grandmother    Thyroid  disease Maternal Grandmother    Heart disease Maternal Grandfather    Hypertension Maternal Grandfather    Hyperlipidemia Maternal Grandfather    CAD Maternal Grandfather    Thyroid  disease Other    CAD Other    Cancer Neg Hx     Social History   Tobacco Use   Smoking  status: Never    Passive exposure: Never   Smokeless tobacco: Never  Vaping Use   Vaping status: Never Used  Substance Use Topics   Alcohol use: No   Drug use: No     Review of Systems:    Constitutional: No unintentional weight loss, fever, chills Cardiovascular: No chest pain Respiratory: No SOB  Gastrointestinal: See HPI and otherwise negative Hematologic: No bleeding or bruising    Physical Exam:  Vital signs: BP 106/70   Pulse 98   Ht 5' 7.5 (1.715 m)   Wt 178 lb 9.6 oz (81 kg)   BMI 27.56 kg/m   Constitutional: Pleasant, well-appearing female in NAD, alert and cooperative Head:  Normocephalic and atraumatic.  Eyes: No scleral icterus. Respiratory: Respirations even and unlabored. Lungs clear to auscultation bilaterally.  No wheezes, crackles, or rhonchi.  Cardiovascular:  Regular rate and rhythm. No murmurs. No peripheral edema. Gastrointestinal:  Soft, nondistended, nontender. No rebound or guarding. Normal bowel sounds. No appreciable masses or hepatomegaly. Rectal:  Deferred to colonoscopy Neurologic:  Alert and oriented x4;  grossly normal neurologically.  Skin:   Dry and intact without significant lesions or rashes. Psychiatric: Oriented to person, place and time. Demonstrates good judgement and reason without abnormal affect or behaviors.   RELEVANT LABS AND IMAGING: CBC    Component Value Date/Time    WBC 6.4 03/19/2024 1017   RBC 4.61 03/19/2024 1017   HGB 13.6 03/19/2024 1017   HGB 10.8 (L) 08/23/2016 1503   HCT 40.9 03/19/2024 1017   HCT 32.5 (L) 08/23/2016 1503   PLT 227.0 03/19/2024 1017   PLT 194 08/23/2016 1503   MCV 88.6 03/19/2024 1017   MCV 85 08/23/2016 1503   MCH 29.7 06/21/2020 1416   MCHC 33.3 03/19/2024 1017   RDW 13.8 03/19/2024 1017   RDW 14.0 08/23/2016 1503   LYMPHSABS 1.9 03/19/2024 1017   MONOABS 0.5 03/19/2024 1017   EOSABS 0.1 03/19/2024 1017   BASOSABS 0.1 03/19/2024 1017    CMP     Component Value Date/Time   NA 138 03/19/2024 1017   NA 136 08/23/2016 1503   K 4.3 03/19/2024 1017   CL 105 03/19/2024 1017   CO2 28 03/19/2024 1017   GLUCOSE 78 03/19/2024 1017   BUN 11 03/19/2024 1017   BUN 11 08/23/2016 1503   CREATININE 0.80 03/19/2024 1017   CREATININE 0.98 06/21/2020 1416   CALCIUM  9.8 03/19/2024 1017   PROT 7.9 03/19/2024 1017   PROT 6.6 08/23/2016 1503   ALBUMIN 4.7 03/19/2024 1017   ALBUMIN 3.4 (L) 08/23/2016 1503   AST 23 03/19/2024 1017   ALT 33 03/19/2024 1017   ALKPHOS 61 03/19/2024 1017   BILITOT 0.4 03/19/2024 1017   BILITOT <0.2 08/23/2016 1503   GFRNONAA >60 11/28/2018 2217   GFRAA >60 11/28/2018 2217   Echocardiogram 10/20/2016 - Mildly increased LV wall thickness with LVEF 55-60% and normal    diastolic function. Trivial tricuspid regurgitation.   Assessment/Plan:   Chronic diarrhea Change in bowel habits Fecal urgency Family history of colon polyps Patient reports frequent loose stools over the last few years.  Prior to this was having a normal, formed bowel movement daily, now having typically 3 loose stools daily and often with urgency.  Sometimes has to rush to the bathroom immediately after eating.  Denies any blood in her stool or melena but does not usually look on the toilet.  Denies any known family history of colon cancer or IBD but does  have family history of colon polyps in her mother.  Has tried Imodium  in  the past without much improvement. Has had recent unremarkable CBC, CMP, TSH.  - Schedule colonoscopy, consider biopsies for microscopic colitis. I thoroughly discussed the procedure with the patient to include nature of the procedure, alternatives, benefits, and risks (including but not limited to bleeding, infection, perforation, anesthesia/cardiac/pulmonary complications). Patient verbalized understanding and gave verbal consent to proceed with procedure.  - Stool studies: C. difficile PCR, fecal pancreatic elastase, fecal fat, fecal calprotectin (will set reminder to follow-up on C. difficile to rule this out prior to endoscopy) - Labs today: ESR, CRP, TTG, IgA - Increase dietary fiber - Increase water intake - Consider starting fiber supplement such as Benefiber 1 tablespoon daily - Recommend 1 month trial of dairy elimination - In future, if workup negative consider empiric treatment with Xifaxan  for possible SIBO.   GERD Esophageal dysphagia Patient reports frequent acid reflux and heartburn, occurring daily.  She has been taking OTC omeprazole  as needed, now pretty much taking daily but continuing to have symptoms.  Has also noticed dysphagia to solids in the last year.  Food gets stuck in her throat and does go down on its own eventually.  No dysphagia to solids.  Denies unintentional weight loss.    - Schedule EGD with possible dilation.  - Start omeprazole  40 mg daily.    Camie Furbish, PA-C [facility] 05/28/2024, 9:47 AM  Patient Care Team: Job Lukes, [place] as PCP - General (Physician Assistant) Delford Maude BROCKS, MD as PCP - Cardiology (Cardiology)

## 2024-05-28 NOTE — Patient Instructions (Addendum)
 _______________________________________________________  If your blood pressure at your visit was 140/90 or greater, please contact your primary care physician to follow up on this.  _______________________________________________________  If you are age 32 or older, your body mass index should be between 23-30. Your Body mass index is 27.56 kg/m. If this is out of the aforementioned range listed, please consider follow up with your Primary Care Provider.  If you are age 43 or younger, your body mass index should be between 19-25. Your Body mass index is 27.56 kg/m. If this is out of the aformentioned range listed, please consider follow up with your Primary Care Provider.   ________________________________________________________  The [facility] providers would like to encourage you to use MYCHART to communicate with providers for non-urgent requests or questions.  Due to long hold times on the telephone, sending your provider a message by [facility] may be a faster and more efficient way to get a response.  Please allow 48 business hours for a response.  Please remember that this is for non-urgent requests.  _______________________________________________________  [facility] is using a team-based approach to care.  Your team is made up of your doctor and two to three APPS. Our APPS (Nurse Practitioners and Physician Assistants) work with your physician to ensure care continuity for you. They are fully qualified to address your health concerns and develop a treatment plan. They communicate directly with your gastroenterologist to care for you. Seeing the Advanced Practice Practitioners on your physician's team can help you by facilitating care more promptly, often allowing for earlier appointments, access to diagnostic testing, procedures, and other specialty referrals.   Your provider has requested that you go to the basement level for lab work before leaving today. Press B on the  elevator. The lab is located at the first door on the left as you exit the elevator.  We have sent the following medications to your pharmacy for you to pick up at your convenience: Omeprazole  40mg  daily Sutab   Please purchase the following medications over the counter and take as directed: Benefiber 1 tablespoon daily  Increase water intake and dietary fiber intake  Try to eliminate dairy for 1 month and track your progress  Please follow up in 1 month after the procedure. Give us  a call at [phone] to schedule an appointment    You have been scheduled for an endoscopy and colonoscopy. Please follow the written instructions given to you at your visit today.  If you use inhalers (even only as needed), please bring them with you on the day of your procedure.  DO NOT TAKE 7 DAYS PRIOR TO TEST- Trulicity (dulaglutide) Ozempic , Wegovy  (semaglutide ) Mounjaro  (tirzepatide ) Bydureon Bcise (exanatide extended release)  DO NOT TAKE 1 DAY PRIOR TO YOUR TEST Rybelsus (semaglutide ) Adlyxin (lixisenatide) Victoza (liraglutide ) Byetta (exanatide) ___________________________________________________________________________  It was a pleasure to see you today!  Thank you for trusting me with your gastrointestinal care!

## 2024-05-29 ENCOUNTER — Other Ambulatory Visit

## 2024-05-29 ENCOUNTER — Ambulatory Visit: Payer: Self-pay | Admitting: Gastroenterology

## 2024-05-29 DIAGNOSIS — R194 Change in bowel habit: Secondary | ICD-10-CM

## 2024-05-29 DIAGNOSIS — R152 Fecal urgency: Secondary | ICD-10-CM | POA: Diagnosis not present

## 2024-05-29 DIAGNOSIS — K529 Noninfective gastroenteritis and colitis, unspecified: Secondary | ICD-10-CM

## 2024-05-29 LAB — TISSUE TRANSGLUTAMINASE, IGA: (tTG) Ab, IgA: <1 U/mL

## 2024-05-29 LAB — IGA: Immunoglobulin A: 308 mg/dL (ref 47–310)

## 2024-05-30 ENCOUNTER — Telehealth: Payer: Self-pay | Admitting: Physician Assistant

## 2024-05-30 LAB — CLOSTRIDIUM DIFFICILE BY PCR: Toxigenic C. Difficile by PCR: NEGATIVE

## 2024-05-30 NOTE — Telephone Encounter (Signed)
 Copied from CRM #[id number]. Topic: General - Other >> May 30, 2024  3:28 PM Martinique E wrote: Reason for CRM: Amy with [employer] called in stating that they are still waiting for an advanced beneficiary notification for testing. Callback number [phone].

## 2024-06-02 DIAGNOSIS — F411 Generalized anxiety disorder: Secondary | ICD-10-CM | POA: Diagnosis not present

## 2024-06-02 DIAGNOSIS — F3162 Bipolar disorder, current episode mixed, moderate: Secondary | ICD-10-CM | POA: Diagnosis not present

## 2024-06-02 DIAGNOSIS — F332 Major depressive disorder, recurrent severe without psychotic features: Secondary | ICD-10-CM | POA: Diagnosis not present

## 2024-06-02 DIAGNOSIS — F4312 Post-traumatic stress disorder, chronic: Secondary | ICD-10-CM | POA: Diagnosis not present

## 2024-06-02 NOTE — Telephone Encounter (Unsigned)
 Copied from CRM #[id number]. Topic: General - Other >> May 30, 2024  4:44 PM Wess RAMAN wrote: Reason for CRM: Megan from [facility] would like to speak with Lucie Rich, PA in regards to a hold on one of the [facility] orders placed for patient  Callback #: [phone]

## 2024-06-02 NOTE — Telephone Encounter (Signed)
 See other message

## 2024-06-02 NOTE — Telephone Encounter (Signed)
 Left message on Kathy Zimmerman's personal voicemail regarding Kathy Zimmerman and that we have placed new orders for pt.

## 2024-06-02 NOTE — Telephone Encounter (Signed)
 Samantha notified of message and said we are going to recollect pt.

## 2024-06-06 ENCOUNTER — Encounter: Payer: Self-pay | Admitting: Physician Assistant

## 2024-06-08 LAB — C. DIFFICILE GDH AND TOXIN A/B
GDH ANTIGEN: NOT DETECTED
MICRO NUMBER:: 16986159
SPECIMEN QUALITY:: ADEQUATE
TOXIN A AND B: NOT DETECTED

## 2024-06-08 LAB — PANCREATIC ELASTASE, FECAL: Pancreatic Elastase-1, Stool: >800 ug/g (ref >200)

## 2024-06-08 LAB — FECAL FAT, QUALITATIVE: FECAL FAT, QUALITATIVE: NORMAL

## 2024-06-12 ENCOUNTER — Ambulatory Visit: Admitting: Neurology

## 2024-06-20 ENCOUNTER — Encounter: Payer: Self-pay | Admitting: Neurology

## 2024-06-20 ENCOUNTER — Encounter: Payer: Self-pay | Admitting: Physician Assistant

## 2024-06-23 ENCOUNTER — Encounter: Payer: Self-pay | Admitting: Physician Assistant

## 2024-06-23 ENCOUNTER — Other Ambulatory Visit: Payer: Self-pay | Admitting: Physician Assistant

## 2024-06-23 DIAGNOSIS — R519 Headache, unspecified: Secondary | ICD-10-CM

## 2024-06-24 ENCOUNTER — Encounter: Payer: Self-pay | Admitting: Physician Assistant

## 2024-06-25 ENCOUNTER — Ambulatory Visit: Admitting: *Deleted

## 2024-06-25 ENCOUNTER — Telehealth: Payer: Self-pay | Admitting: *Deleted

## 2024-06-25 DIAGNOSIS — Z789 Other specified health status: Secondary | ICD-10-CM

## 2024-06-25 LAB — POCT URINE PREGNANCY: Preg Test, Ur: NEGATIVE

## 2024-06-25 MED ORDER — MEDROXYPROGESTERONE ACETATE 150 MG/ML IM SUSP
150.0000 mg | Freq: Once | INTRAMUSCULAR | Status: AC
  Administered 2024-06-25: 150 mg via INTRAMUSCULAR

## 2024-06-25 NOTE — Telephone Encounter (Signed)
 Call patient and schedule Nurse visit for Depo shot.

## 2024-06-25 NOTE — Telephone Encounter (Signed)
 Copied from CRM [id number]. Topic: Appointments - Scheduling Inquiry for Clinic >> Jun 24, 2024  4:22 PM Tysheama G wrote: Reason for CRM: Patient would like to schedule her Depo-Provera  shot. Callback number [phone]

## 2024-06-25 NOTE — Progress Notes (Signed)
 Patient present for Depo Provera   Pregnancy test negative  Given IM Left deltoid  Patient tolerated well  Advise to return on or between Dec 31-Jan14

## 2024-07-02 ENCOUNTER — Other Ambulatory Visit: Payer: Self-pay | Admitting: Physician Assistant

## 2024-07-08 ENCOUNTER — Other Ambulatory Visit: Payer: Self-pay | Admitting: Medical Genetics

## 2024-07-08 ENCOUNTER — Encounter: Payer: Self-pay | Admitting: Physician Assistant

## 2024-07-08 DIAGNOSIS — Z006 Encounter for examination for normal comparison and control in clinical research program: Secondary | ICD-10-CM

## 2024-07-09 MED ORDER — NEEDLES & SYRINGES MISC: 1.0000 | 0 refills | Status: AC

## 2024-07-10 ENCOUNTER — Inpatient Hospital Stay
Admission: RE | Admit: 2024-07-10 | Discharge: 2024-07-10 | Disposition: A | Source: Ambulatory Visit | Attending: Physician Assistant

## 2024-07-10 DIAGNOSIS — R519 Headache, unspecified: Secondary | ICD-10-CM

## 2024-07-14 ENCOUNTER — Encounter: Payer: Self-pay | Admitting: Physician Assistant

## 2024-07-14 MED ORDER — ZEPBOUND 15 MG/0.5ML ~~LOC~~ SOLN: 15.0000 mg | SUBCUTANEOUS | 2 refills | Status: DC

## 2024-07-14 NOTE — Telephone Encounter (Signed)
 Please see message and advise

## 2024-07-16 ENCOUNTER — Ambulatory Visit: Payer: Self-pay | Admitting: Physician Assistant

## 2024-07-16 ENCOUNTER — Encounter: Payer: Self-pay | Admitting: Physician Assistant

## 2024-07-16 ENCOUNTER — Encounter: Admitting: Gastroenterology

## 2024-07-17 ENCOUNTER — Encounter: Payer: Self-pay | Admitting: Physician Assistant

## 2024-07-17 ENCOUNTER — Other Ambulatory Visit: Payer: Self-pay | Admitting: Physician Assistant

## 2024-07-17 DIAGNOSIS — R519 Headache, unspecified: Secondary | ICD-10-CM

## 2024-07-17 NOTE — Telephone Encounter (Signed)
 Noted

## 2024-07-23 ENCOUNTER — Encounter: Payer: Self-pay | Admitting: Physician Assistant

## 2024-07-23 ENCOUNTER — Ambulatory Visit (INDEPENDENT_AMBULATORY_CARE_PROVIDER_SITE_OTHER): Admitting: Physician Assistant

## 2024-07-23 VITALS — BP 122/80 | HR 96 | Temp 98.0°F | Ht 67.5 in | Wt 178.0 lb

## 2024-07-23 DIAGNOSIS — R519 Headache, unspecified: Secondary | ICD-10-CM

## 2024-07-23 DIAGNOSIS — L299 Pruritus, unspecified: Secondary | ICD-10-CM | POA: Diagnosis not present

## 2024-07-23 MED ORDER — CYCLOBENZAPRINE HCL 5 MG PO TABS: 5.0000 mg | ORAL_TABLET | Freq: Three times a day (TID) | ORAL | 1 refills | Status: AC | PRN

## 2024-07-23 NOTE — Patient Instructions (Signed)
 Migraine Recommendations: 2.  Take nurtec at earliest onset of headache.   3.  Limit use of pain relievers to no more than 2 days out of the week.  These medications include acetaminophen , ibuprofen , triptans and narcotics.  This will help reduce risk of rebound headaches. 4.  Be aware of common food triggers such as processed sweets, processed foods with nitrites (such as deli meat, hot dogs, sausages), foods with MSG, alcohol (such as wine), chocolate, certain cheeses, certain fruits (dried fruits, bananas, pineapple), vinegar, diet soda. 4.  Avoid caffeine  5.  Routine exercise 6.  Proper sleep hygiene 7.  Stay adequately hydrated with water 8.  Keep a headache diary. 9.  Maintain proper stress management. 10.  Do not skip meals. 11.  Consider supplements:  Magnesium  citrate 400mg  to 600mg  daily, riboflavin 400mg , Coenzyme Q 10 100mg  three times daily

## 2024-07-23 NOTE — Progress Notes (Signed)
 Kathy Zimmerman is a 33 y.o. female here for a follow up of a pre-existing problem.  History of Present Illness:   Chief Complaint  Patient presents with   Headache    Pt c/o increase in headaches, having them everyday temporal area. Has tried Tylenol , Excedrin Migraine, no relief.   Discussed the use of AI scribe software for clinical note transcription with the patient, who gave verbal consent to proceed.  History of Present Illness   Kathy Zimmerman is a 33 year old female who presents with worsening daily headaches.  She experiences severe headaches that have intensified, lasting up to four days, often starting suddenly and sometimes waking her up. The pain is primarily located behind her left eye. Moving her head exacerbates the pain, and she experiences nausea and photophobia. Tylenol  and Excedrin migraine provide no relief, but hydrocodone  is helpful.  The headaches occur daily, typically starting in the middle of the day after eating, and can spread across both sides of her head. She sometimes feels left sided facial numbness. There have been no recent changes to her psychiatric medications. She takes Zepbound  but cannot afford the highest dose. She has not been exercising much due to overwhelming responsibilities, including childcare and PTA participation.  She is a single parent managing her children and responsibilities largely on her own. Her mother, previously a source of support, is unable to assist due to her own health issues. She feels exhausted and lacks a support system.  She does not drink enough water and sometimes skips meals, often eating one large meal in the afternoon. She experiences headaches after eating and feels generally unwell, with nausea being common. She takes three Benadryl  at night to help with sleep.  Her past medical history includes eye surgery in 2012 or 2013, and a retina specialist has indicated the headaches are not related to her eye condition.  She has a history of high caffeine  consumption and low water intake. Her mental health history includes past suicidal ideations attributed to a previous medication.   MRI on 07/10/24 showed no acute findings.    She also endorses a left inner breast with significant itching. Reports there is no pain/crusting/skin changes.  Past Medical History:  Diagnosis Date   Anxiety    Asthma    Bipolar disorder (HCC)    Depression    Headache    IBS (irritable bowel syndrome)    Panic attack    Preeclampsia    PTSD (post-traumatic stress disorder)      Social History   Tobacco Use   Smoking status: Never    Passive exposure: Never   Smokeless tobacco: Never  Vaping Use   Vaping status: Never Used  Substance Use Topics   Alcohol use: No   Drug use: No    Past Surgical History:  Procedure Laterality Date   CESAREAN SECTION N/A 04/10/2015   Procedure: CESAREAN SECTION;  Surgeon: Ovid All, MD;  Location: [facility];  Service: Obstetrics;  Laterality: N/A;   EYE SURGERY     laser   SCLERAL BUCKLE  08/09/2011   Procedure: SCLERAL BUCKLE;  Surgeon: Arley LABOR Rankin;  Location: [facility];  Service: Ophthalmology;  Laterality: Left;  with cryo   TENDON REPAIR     R middle finger    Family History  Problem Relation Age of Onset   Allergic rhinitis Mother    Eczema Father    Asthma Father    Allergic rhinitis Father    Depression  Father    Depression Sister    Mental illness Sister    Depression Maternal Grandmother    Mental illness Maternal Grandmother    Thyroid  disease Maternal Grandmother    Heart disease Maternal Grandfather    Hypertension Maternal Grandfather    Hyperlipidemia Maternal Grandfather    CAD Maternal Grandfather    Thyroid  disease Other    CAD Other    Cancer Neg Hx     Allergies  Allergen Reactions   Cat Dander Anaphylaxis   Shrimp [Shellfish Allergy ] Anaphylaxis and Swelling   Sulfasalazine Rash   Sulfa Antibiotics Rash    Current Medications:    Current Outpatient Medications:    busPIRone (BUSPAR) 10 MG tablet, Take 10 mg by mouth in the morning and at bedtime., Disp: , Rfl:    chlorproMAZINE (THORAZINE) 100 MG tablet, Take 100 mg by mouth at bedtime., Disp: , Rfl:    diphenhydrAMINE  (BENADRYL ) 50 MG tablet, Take 50 mg by mouth at bedtime as needed., Disp: , Rfl:    EPINEPHrine  0.3 mg/0.3 mL IJ SOAJ injection, Inject 0.3 mg into the muscle as needed for anaphylaxis., Disp: 2 each, Rfl: 1   gabapentin (NEURONTIN) 600 MG tablet, Take 1,800 mg by mouth daily., Disp: , Rfl:    lithium  600 MG capsule, Take 600 mg by mouth at bedtime., Disp: , Rfl:    medroxyPROGESTERone  (DEPO-PROVERA ) 150 MG/ML injection, Inject 1 mL (150 mg total) into the muscle every 3 (three) months., Disp: 1 mL, Rfl: 0   megestrol  (MEGACE ) 40 MG tablet, TAKE 3 TABS X 5 DAYS, 2 TABS X 5 DAYS, 1 TAB DAILY TO CONTROL VAG BLEEDING, STOP WHEN BLEEDING STOPS, (Patient taking differently: as needed. TAKE 3 TABS X 5 DAYS, 2 TABS X 5 DAYS, 1 TAB DAILY TO CONTROL VAG BLEEDING, STOP WHEN BLEEDING STOPS,), Disp: 45 tablet, Rfl: 0   Needles & Syringes MISC, 1 Syringe by Does not apply route once a week., Disp: 1 Box, Rfl: 0   omeprazole  (PRILOSEC) 40 MG capsule, Take 1 capsule (40 mg total) by mouth daily., Disp: 30 capsule, Rfl: 3   ondansetron  (ZOFRAN ) 8 MG tablet, Take 1 tablet (8 mg total) by mouth every 8 (eight) hours as needed for nausea or vomiting., Disp: 20 tablet, Rfl: 0   rifaximin  (XIFAXAN ) 550 MG TABS tablet, Take 1 tablet (550 mg total) by mouth daily in the afternoon., Disp: 9 tablet, Rfl: 0   Sodium Sulfate-Mag Sulfate-KCl (SUTAB ) [phone] MG TABS, Take 24 tablets by mouth as directed. For colonoscopy, Disp: 24 tablet, Rfl: 0   Tirzepatide -Weight Management (ZEPBOUND ) 15 MG/0.5ML SOLN, Inject 15 mg into the skin once a week., Disp: 2 mL, Rfl: 2   tretinoin (RETIN-A) 0.025 % cream, Apply 1 Application topically at bedtime., Disp: , Rfl:    venlafaxine XR  (EFFEXOR-XR) 150 MG 24 hr capsule, Take 150 mg by mouth daily., Disp: , Rfl:    venlafaxine XR (EFFEXOR-XR) 75 MG 24 hr capsule, Take by mouth., Disp: , Rfl:    Review of Systems:   Negative unless otherwise specified per HPI.  Vitals:   Vitals:   07/23/24 1449  BP: 122/80  Pulse: 96  Temp: 98 F (36.7 C)  TempSrc: Temporal  SpO2: 97%  Weight: 178 lb (80.7 kg)  Height: 5' 7.5 (1.715 m)     Body mass index is 27.47 kg/m.  Physical Exam:   Physical Exam Vitals and nursing note reviewed.  Constitutional:      General: She  is not in acute distress.    Appearance: She is well-developed. She is not ill-appearing or toxic-appearing.  Cardiovascular:     Rate and Rhythm: Normal rate and regular rhythm.     Pulses: Normal pulses.     Heart sounds: Normal heart sounds, S1 normal and S2 normal.  Pulmonary:     Effort: Pulmonary effort is normal.     Breath sounds: Normal breath sounds.  Chest:     Comments: No obvious abnormalities of left inner breast Skin:    General: Skin is warm and dry.  Neurological:     General: No focal deficit present.     Mental Status: She is alert.     GCS: GCS eye subscore is 4. GCS verbal subscore is 5. GCS motor subscore is 6.     Cranial Nerves: Cranial nerves 2-12 are intact.     Sensory: Sensation is intact.     Motor: Motor function is intact.  Psychiatric:        Speech: Speech normal.        Behavior: Behavior normal. Behavior is cooperative.     Assessment and Plan:   Assessment and Plan    Worsening headache(s)  Chronic daily headaches with tension-type characteristics and possible migraine features. Potential rebound headaches from acetaminophen . Blood sugar fluctuations considered as a contributing factor. - Prescribed Flexeril  as needed, up to three times daily. - Provided Nurtec sample for acute relief, one dose every 48 hours. - Advised discontinuation of acetaminophen . - Provided continuous glucose monitor to see if  post-meal blood sugars are of concern. - Consider magnesium  citrate, riboflavin, and CoQ10 supplements. - Advised against skipping meals. - continue plan to see neurologist soon.  Itchy skin Persistent itching of the left breast with slight redness, no rash or lesions. - Recommended topical cortisone cream. - Advised monitoring for rash or lesions.  - Consider imaging if symptom(s) persist     [name], PA-C

## 2024-07-25 ENCOUNTER — Other Ambulatory Visit: Payer: Self-pay

## 2024-07-25 ENCOUNTER — Emergency Department (HOSPITAL_BASED_OUTPATIENT_CLINIC_OR_DEPARTMENT_OTHER)
Admission: EM | Admit: 2024-07-25 | Discharge: 2024-07-26 | Disposition: A | Discharge status: Home / Self Care | Attending: Emergency Medicine | Admitting: Emergency Medicine

## 2024-07-25 ENCOUNTER — Encounter (HOSPITAL_BASED_OUTPATIENT_CLINIC_OR_DEPARTMENT_OTHER): Payer: Self-pay | Admitting: Emergency Medicine

## 2024-07-25 ENCOUNTER — Emergency Department (HOSPITAL_BASED_OUTPATIENT_CLINIC_OR_DEPARTMENT_OTHER)

## 2024-07-25 DIAGNOSIS — T50905A Adverse effect of unspecified drugs, medicaments and biological substances, initial encounter: Secondary | ICD-10-CM

## 2024-07-25 DIAGNOSIS — R519 Headache, unspecified: Secondary | ICD-10-CM | POA: Insufficient documentation

## 2024-07-25 DIAGNOSIS — R0602 Shortness of breath: Secondary | ICD-10-CM | POA: Diagnosis not present

## 2024-07-25 DIAGNOSIS — R000 Tachycardia, unspecified: Secondary | ICD-10-CM | POA: Diagnosis not present

## 2024-07-25 DIAGNOSIS — R9431 Abnormal electrocardiogram [ECG] [EKG]: Secondary | ICD-10-CM | POA: Diagnosis not present

## 2024-07-25 DIAGNOSIS — T383X5A Adverse effect of insulin and oral hypoglycemic [antidiabetic] drugs, initial encounter: Secondary | ICD-10-CM | POA: Diagnosis not present

## 2024-07-25 LAB — COMPREHENSIVE METABOLIC PANEL WITH GFR
ALT: 18 U/L (ref 0–44)
AST: 18 U/L (ref 15–41)
Albumin: 4.8 g/dL (ref 3.5–5.0)
Alkaline Phosphatase: 84 U/L (ref 38–126)
Anion gap: 13 (ref 5–15)
BUN: 8 mg/dL (ref 6–20)
CO2: 23 mmol/L (ref 22–32)
Calcium: 9.8 mg/dL (ref 8.9–10.3)
Chloride: 103 mmol/L (ref 98–111)
Creatinine, Ser: 1.03 mg/dL — ABNORMAL HIGH (ref 0.44–1.00)
GFR, Estimated: >60 mL/min (ref >60)
Glucose, Bld: 88 mg/dL (ref 70–99)
Potassium: 3.5 mmol/L (ref 3.5–5.1)
Sodium: 139 mmol/L (ref 135–145)
Total Bilirubin: 0.3 mg/dL (ref 0.0–1.2)
Total Protein: 8.4 g/dL — ABNORMAL HIGH (ref 6.5–8.1)

## 2024-07-25 LAB — URINALYSIS, ROUTINE W REFLEX MICROSCOPIC
Bilirubin Urine: NEGATIVE
Glucose, UA: NEGATIVE mg/dL
Hgb urine dipstick: NEGATIVE
Ketones, ur: NEGATIVE mg/dL
Leukocytes,Ua: NEGATIVE
Nitrite: NEGATIVE
Protein, ur: NEGATIVE mg/dL
Specific Gravity, Urine: 1.008 (ref 1.005–1.030)
pH: 7 (ref 5.0–8.0)

## 2024-07-25 LAB — CBC WITH DIFFERENTIAL/PLATELET
Abs Immature Granulocytes: 0.05 K/uL (ref 0.00–0.07)
Basophils Absolute: 0.1 K/uL (ref 0.0–0.1)
Basophils Relative: 1 %
Eosinophils Absolute: 0.2 K/uL (ref 0.0–0.5)
Eosinophils Relative: 2 %
HCT: 43.2 % (ref 36.0–46.0)
Hemoglobin: 14.5 g/dL (ref 12.0–15.0)
Immature Granulocytes: 1 %
Lymphocytes Relative: 33 %
Lymphs Abs: 3.6 K/uL (ref 0.7–4.0)
MCH: 29.5 pg (ref 26.0–34.0)
MCHC: 33.6 g/dL (ref 30.0–36.0)
MCV: 88 fL (ref 80.0–100.0)
Monocytes Absolute: 0.6 K/uL (ref 0.1–1.0)
Monocytes Relative: 5 %
Neutro Abs: 6.4 K/uL (ref 1.7–7.7)
Neutrophils Relative %: 58 %
Platelets: 257 K/uL (ref 150–400)
RBC: 4.91 MIL/uL (ref 3.87–5.11)
RDW: 12.7 % (ref 11.5–15.5)
WBC: 10.9 K/uL — ABNORMAL HIGH (ref 4.0–10.5)
nRBC: 0 % (ref 0.0–0.2)

## 2024-07-25 LAB — PREGNANCY, URINE: Preg Test, Ur: NEGATIVE

## 2024-07-25 LAB — CBG MONITORING, ED
Glucose-Capillary: 115 mg/dL — ABNORMAL HIGH (ref 70–99)
Glucose-Capillary: 104 mg/dL — ABNORMAL HIGH (ref 70–99)
Glucose-Capillary: 117 mg/dL — ABNORMAL HIGH (ref 70–99)
Glucose-Capillary: 83 mg/dL (ref 70–99)

## 2024-07-25 MED ORDER — SODIUM CHLORIDE 0.9% FLUSH: 3.0000 mL | Freq: Two times a day (BID) | INTRAVENOUS | Status: DC

## 2024-07-25 MED ORDER — SODIUM CHLORIDE 0.9 % IV SOLN: 250.0000 mL | INTRAVENOUS | Status: DC | PRN

## 2024-07-25 MED ORDER — SODIUM CHLORIDE 0.9% FLUSH: 3.0000 mL | INTRAVENOUS | Status: DC | PRN

## 2024-07-25 NOTE — ED Triage Notes (Signed)
  Patient comes in with stated hypoglycemia.  Patient states she has been having headaches and her PCP was getting her to check her CBG several times a day.  Patient states she has had several readings in the 40s.  Only symptoms have been headache and light nausea.  Denies any emesis, or diarrhea.  Pain 3/10, headache.     Patient states she has had MRIs and a lot of lab tests to determine the etiology of her headaches.  Patient thinks it may be from her GLP-1 medication that she started a few months ago.  CBG in triage is 116.

## 2024-07-25 NOTE — ED Provider Notes (Signed)
 [place] EMERGENCY DEPARTMENT AT [facility] Provider Note   CSN: [id number] Arrival date & time: 07/25/24  2205     Patient presents with: Hypoglycemia and Headache   Kathy Zimmerman is a 33 y.o. female.   Patient is a 33 year old female with a past medical history of obesity and Zepbound  presenting to the emergency department with hypoglycemia.  The patient states that she has been having headaches since starting the Zepbound  and that her primary doctor prescribed her a Dexcom monitor.  She states that this week she increased her dose from 5 mg to 10 mg and has been monitoring her blood sugar.  She states that occasionally it would drop down into the 50s but today it was dropping to the 40s and occasionally would Tobey low which means her level was below 40.  She states that she would eat skittles, soda and a peanut butter and jelly sandwich and her blood sugar would come up and then about 30 minutes later would start to drop low again.  She states that her headaches slightly worsen when her blood sugars lower.  She denies any recent fevers or illnesses, nausea, vomiting, dysuria or hematuria.  She states that she does feel little short of breath when her blood sugar is low.   Hypoglycemia Headache      Prior to Admission medications   Medication Sig Start Date End Date Taking? Authorizing Provider  busPIRone (BUSPAR) 10 MG tablet Take 10 mg by mouth in the morning and at bedtime. 04/13/20   [provider]  chlorproMAZINE (THORAZINE) 100 MG tablet Take 100 mg by mouth at bedtime. 05/14/24   [provider]  cyclobenzaprine  (FLEXERIL ) 5 MG tablet Take 1-2 tablets (5-10 mg total) by mouth 3 (three) times daily as needed for muscle spasms. 07/23/24   Job Lukes, PA  diphenhydrAMINE  (BENADRYL ) 50 MG tablet Take 50 mg by mouth at bedtime as needed.    [provider]  EPINEPHrine  0.3 mg/0.3 mL IJ SOAJ injection Inject 0.3 mg into the muscle as  needed for anaphylaxis. 03/24/24   Job Lukes, PA  gabapentin (NEURONTIN) 600 MG tablet Take 1,800 mg by mouth daily. 04/02/20   [provider]  lithium  600 MG capsule Take 600 mg by mouth at bedtime. 12/28/23   [provider]  medroxyPROGESTERone  (DEPO-PROVERA ) 150 MG/ML injection Inject 1 mL (150 mg total) into the muscle every 3 (three) months. 04/06/22   Job Lukes, PA  megestrol  (MEGACE ) 40 MG tablet TAKE 3 TABS X 5 DAYS, 2 TABS X 5 DAYS, 1 TAB DAILY TO CONTROL VAG BLEEDING, STOP WHEN BLEEDING STOPS, Patient taking differently: as needed. TAKE 3 TABS X 5 DAYS, 2 TABS X 5 DAYS, 1 TAB DAILY TO CONTROL VAG BLEEDING, STOP WHEN BLEEDING STOPS, 03/23/22   Job Lukes, PA  Needles & Syringes MISC 1 Syringe by Does not apply route once a week. 07/09/24   Job Lukes, PA  omeprazole  (PRILOSEC) 40 MG capsule Take 1 capsule (40 mg total) by mouth daily. 05/28/24   Heinz, Sara E, PA-C  ondansetron  (ZOFRAN ) 8 MG tablet Take 1 tablet (8 mg total) by mouth every 8 (eight) hours as needed for nausea or vomiting. 05/07/24   Job Lukes, PA  rifaximin  (XIFAXAN ) 550 MG TABS tablet Take 1 tablet (550 mg total) by mouth daily in the afternoon. 05/07/24   Job Lukes, PA  Sodium Sulfate-Mag Sulfate-KCl (SUTAB ) [phone] MG TABS Take 24 tablets by mouth as directed. For colonoscopy 05/28/24  Heinz, Camie BRAVO, PA-C  Tirzepatide -Weight Management (ZEPBOUND ) 15 MG/0.5ML SOLN Inject 15 mg into the skin once a week. 07/14/24   Job Lukes, PA  tretinoin (RETIN-A) 0.025 % cream Apply 1 Application topically at bedtime. 10/31/22   [provider]  venlafaxine XR (EFFEXOR-XR) 150 MG 24 hr capsule Take 150 mg by mouth daily. 04/14/20   [provider]  venlafaxine XR (EFFEXOR-XR) 75 MG 24 hr capsule Take by mouth. 08/15/21   [provider]    Allergies: Cat dander, Shrimp [shellfish allergy ], Sulfasalazine, and Sulfa antibiotics    Review of Systems   Neurological:  Positive for headaches.    Updated Vital Signs BP (!) 137/101   Pulse (!) 113   Temp 97.9 F (36.6 C)   Resp 20   Ht 5' 6 (1.676 m)   Wt 77.1 kg   SpO2 100%   BMI 27.44 kg/m   Physical Exam Vitals and nursing note reviewed.  Constitutional:      General: She is not in acute distress.    Appearance: She is well-developed.  HENT:     Head: Normocephalic.     Mouth/Throat:     Mouth: Mucous membranes are moist.  Eyes:     Extraocular Movements: Extraocular movements intact.  Cardiovascular:     Rate and Rhythm: Regular rhythm. Tachycardia present.     Heart sounds: Normal heart sounds.  Pulmonary:     Effort: Pulmonary effort is normal.     Breath sounds: Normal breath sounds.  Abdominal:     Palpations: Abdomen is soft.     Tenderness: There is no abdominal tenderness.  Musculoskeletal:        General: Normal range of motion.     Cervical back: Normal range of motion and neck supple.  Skin:    General: Skin is warm and dry.  Neurological:     General: No focal deficit present.     Mental Status: She is alert and oriented to person, place, and time.  Psychiatric:        Mood and Affect: Mood normal.        Behavior: Behavior normal.     (all labs ordered are listed, but only abnormal results are displayed) Labs Reviewed  CBC WITH DIFFERENTIAL/PLATELET - Abnormal; Notable for the following components:      Result Value   WBC 10.9 (*)    All other components within normal limits  COMPREHENSIVE METABOLIC PANEL WITH GFR - Abnormal; Notable for the following components:   Creatinine, Ser 1.03 (*)    Total Protein 8.4 (*)    All other components within normal limits  CBG MONITORING, ED - Abnormal; Notable for the following components:   Glucose-Capillary 115 (*)    All other components within normal limits  CBG MONITORING, ED - Abnormal; Notable for the following components:   Glucose-Capillary 117 (*)    All other components within normal  limits  CBG MONITORING, ED - Abnormal; Notable for the following components:   Glucose-Capillary 104 (*)    All other components within normal limits  PREGNANCY, URINE  URINALYSIS, ROUTINE W REFLEX MICROSCOPIC  CBG MONITORING, ED    EKG: EKG Interpretation Date/Time:  Friday July 25 2024 23:29:03 EST Ventricular Rate:  92 PR Interval:  165 QRS Duration:  82 QT Interval:  349 QTC Calculation: 432 R Axis:   50  Text Interpretation: Sinus rhythm No significant change since last tracing Confirmed by Ellouise Fine ([id number]) on 07/25/2024 11:32:18 PM  Radiology: [facility] Chest Port 1 View Result Date: 07/25/2024 EXAM: 1 VIEW(S) XRAY OF THE CHEST 07/25/2024 11:00:00 PM COMPARISON: Chest x-ray and CT chest 06/22/2020 CLINICAL HISTORY: SOB FINDINGS: LUNGS AND PLEURA: No focal pulmonary opacity. No pleural effusion. No pneumothorax. HEART AND MEDIASTINUM: No acute abnormality of the cardiac and mediastinal silhouettes. BONES AND SOFT TISSUES: No acute osseous abnormality. IMPRESSION: 1. No acute cardiopulmonary process. Electronically signed by: Morgane Naveau MD 07/25/2024 11:17 PM EST RP Workstation: [id number]     Procedures   Medications Ordered in the ED  sodium chloride  flush (NS) 0.9 % injection 3 mL (has no administration in time range)  sodium chloride  flush (NS) 0.9 % injection 3 mL (has no administration in time range)  0.9 %  sodium chloride  infusion (has no administration in time range)    Clinical Course as of 07/25/24 2333  Fri Jul 25, 2024  2332 Patient signed out to Dr. Bari pending glucose monitoring. [VK]    Clinical Course User Index [VK] Kingsley, Damir Leung K, DO                                 Medical Decision Making This patient presents to the ED with chief complaint(s) of hypoglycemia with pertinent past medical history of obesity, anxiety which further complicates the presenting complaint. The complaint involves an extensive differential diagnosis and  also carries with it a high risk of complications and morbidity.    The differential diagnosis includes medication side effect, infection, hepatorenal dysfunction  Additional history obtained: Additional history obtained from N/A Records reviewed Primary Care Documents  ED Course and Reassessment: On patient's arrival she was tachycardic and otherwise hemodynamically stable in no acute distress.  Initial blood glucose on arrival was in the 110s after being in the 40s at home and once brought back down to the room and dropped to 83.  She was given food and drink and will continue to be monitored.  She will additional lab workup to evaluate for etiology of her hypoglycemia and will continue to be closely monitored.  Independent labs interpretation:  The following labs were independently interpreted: mild leukocytosis otherwise within normal range  Independent visualization of imaging: - I independently visualized the following imaging with scope of interpretation limited to determining acute life threatening conditions related to emergency care: CXR, which revealed no acute disease   Amount and/or Complexity of Data Reviewed Labs: ordered. Radiology: ordered.  Risk Prescription drug management.       Final diagnoses:  None    ED Discharge Orders     None          Kingsley, Nayali Talerico K, [place] 07/25/24 2333

## 2024-07-25 NOTE — ED Notes (Signed)
  Patient CBG now reading 78.  MD notified.  Patient given 12 oz apple juice.

## 2024-07-25 NOTE — ED Notes (Signed)
 Juice and crackers given.

## 2024-07-26 LAB — CBG MONITORING, ED
Glucose-Capillary: 126 mg/dL — ABNORMAL HIGH (ref 70–99)
Glucose-Capillary: 89 mg/dL (ref 70–99)
Glucose-Capillary: 96 mg/dL (ref 70–99)

## 2024-07-26 MED ORDER — BLOOD GLUCOSE MONITOR KIT: PACK | 0 refills | Status: AC

## 2024-07-26 NOTE — Discharge Instructions (Signed)
 You were seen today for headache and concerns for low blood sugar.  Your blood sugars on more glucose monitor were not correlating with the blood sugars obtained here.  Blood sugars here remained within normal range.  Your headache may very well be related to the Zepbound  that you are taking.  Prescription for generic blood glucose monitoring was provided.  You can continue to monitor at home.  Eat frequent small meals.  Follow-up closely with your primary doctor.

## 2024-07-26 NOTE — ED Notes (Signed)
 PT continues to have discrepancy from department glucometer reading and her wearable glucometer. PT denies symptoms of Hypoglycemia at this time.  VSS NAD PT on room air

## 2024-07-26 NOTE — ED Notes (Signed)
CBG 89 

## 2024-07-26 NOTE — ED Provider Notes (Signed)
 Patient is awake and alert.  On recheck while I was in the room, patient's continuous glucose monitor went off with a blood sugar of 55.  Bedside fingerstick 89.  Not clearly correlating.  Patient states that at no time during her low glucose readings at home did she feel symptomatic including dizziness, somnolence, nausea.  She has just had a persistent headache since adjusting her Zepbound .  Discussed with her that I did not feel like her herlene was accurately calibrated.  Will provide her with a prescription for a hand-held glucometer.  At this time she has had no evidence of persistent hypoglycemia while here in the emergency department.  Patient is agreeable to this plan.  Physical Exam  BP (!) 127/93 (BP Location: Right Arm)   Pulse 92   Temp 98 F (36.7 C) (Oral)   Resp 17   Ht 1.676 m (5' 6)   Wt 77.1 kg   SpO2 100%   BMI 27.44 kg/m   Physical Exam Awake, alert, no acute distress  Procedures  Procedures  ED Course / MDM   Clinical Course as of 07/26/24 0226  Fri Jul 25, 2024  2332 Patient signed out to Dr. Bari pending glucose monitoring. [VK]  Sat Jul 26, 2024  0143 Fingerstick glucose are not correlating with patient's monitor.  Monitor was alarming while I was in the room at 53; however, fingerstick blood sugar 89. [CH]    Clinical Course User Index [CH] Jannette Cotham, Charmaine FALCON, MD [VK] Kingsley, Victoria K, DO   Medical Decision Making Amount and/or Complexity of Data Reviewed Labs: ordered. Radiology: ordered.  Risk OTC drugs. Prescription drug management.   Problem List Items Addressed This Visit   None Visit Diagnoses       Acute nonintractable headache, unspecified headache type    -  Primary     Adverse effect of drug, initial encounter                 Bari Charmaine FALCON, MD 07/26/24 [phone]

## 2024-07-30 ENCOUNTER — Telehealth: Payer: Self-pay

## 2024-07-30 NOTE — Telephone Encounter (Signed)
 Transition Care Management Unsuccessful Follow-up Telephone Call  Date of discharge and from where:  07/26/24 [facility]  Attempts:  1st Attempt  Reason for unsuccessful TCM follow-up call:  Left voice message; LVM for patient to complete TOC call and schedule for ED follow up with PCP. Advised to call our office to schedule this appointment. If pt returns call please schedule pt accordingly.

## 2024-08-05 ENCOUNTER — Encounter: Payer: Self-pay | Admitting: Physician Assistant

## 2024-08-05 DIAGNOSIS — F3162 Bipolar disorder, current episode mixed, moderate: Secondary | ICD-10-CM | POA: Diagnosis not present

## 2024-08-05 DIAGNOSIS — F4312 Post-traumatic stress disorder, chronic: Secondary | ICD-10-CM | POA: Diagnosis not present

## 2024-08-05 DIAGNOSIS — F603 Borderline personality disorder: Secondary | ICD-10-CM | POA: Diagnosis not present

## 2024-08-05 MED ORDER — TRETINOIN 0.025 % EX CREA: 1.0000 | TOPICAL_CREAM | Freq: Every day | CUTANEOUS | 0 refills | Status: AC

## 2024-08-05 NOTE — Telephone Encounter (Signed)
 Kathy Zimmerman she wants refill for Retin A cream?

## 2024-08-05 NOTE — Telephone Encounter (Signed)
 Please see message and advise of okay to fill Tretinoin  0.025% cream?

## 2024-08-15 ENCOUNTER — Other Ambulatory Visit: Payer: Self-pay

## 2024-08-15 ENCOUNTER — Encounter: Payer: Self-pay | Admitting: Physician Assistant

## 2024-08-24 ENCOUNTER — Other Ambulatory Visit: Payer: Self-pay | Admitting: Gastroenterology

## 2024-08-24 ENCOUNTER — Encounter: Payer: Self-pay | Admitting: Physician Assistant

## 2024-08-26 ENCOUNTER — Ambulatory Visit: Admitting: Neurology

## 2024-08-27 ENCOUNTER — Ambulatory Visit: Admitting: Pulmonary Disease

## 2024-09-05 ENCOUNTER — Encounter: Payer: Self-pay | Admitting: Physician Assistant

## 2024-09-05 NOTE — Telephone Encounter (Signed)
 Noted. Send to [employer] to make appointment.

## 2024-09-05 NOTE — Telephone Encounter (Signed)
 Please review and advise. Tks

## 2024-09-23 ENCOUNTER — Ambulatory Visit

## 2024-09-23 DIAGNOSIS — Z789 Other specified health status: Secondary | ICD-10-CM

## 2024-09-23 MED ORDER — MEDROXYPROGESTERONE ACETATE 150 MG/ML IM SUSP
150.0000 mg | Freq: Once | INTRAMUSCULAR | Status: AC
  Administered 2024-09-23: 150 mg via INTRAMUSCULAR

## 2024-09-23 NOTE — Progress Notes (Signed)
 Patient is in office today for a nurse visit for Birth Control Injection. Patient Tolerated well in Right Ventrogluteal.

## 2024-09-24 ENCOUNTER — Encounter: Payer: Self-pay | Admitting: Physician Assistant

## 2024-09-25 ENCOUNTER — Other Ambulatory Visit: Payer: Self-pay | Admitting: Physician Assistant

## 2024-09-25 ENCOUNTER — Telehealth: Payer: Self-pay | Admitting: *Deleted

## 2024-09-25 ENCOUNTER — Encounter: Payer: Self-pay | Admitting: Physician Assistant

## 2024-09-25 ENCOUNTER — Telehealth: Admitting: Physician Assistant

## 2024-09-25 VITALS — Ht 67.0 in | Wt 173.0 lb

## 2024-09-25 DIAGNOSIS — E663 Overweight: Secondary | ICD-10-CM

## 2024-09-25 DIAGNOSIS — E782 Mixed hyperlipidemia: Secondary | ICD-10-CM | POA: Diagnosis not present

## 2024-09-25 MED ORDER — WEGOVY 0.5 MG/0.5ML ~~LOC~~ SOAJ: 0.5000 mg | SUBCUTANEOUS | 1 refills | Status: AC

## 2024-09-25 NOTE — Progress Notes (Signed)
 "   Virtual Visit via Video Note   I, Kathy Zimmerman, connected with  Kathy Zimmerman  ([id number], 1991/04/12) on 09/25/24 at  8:20 AM EST by a video-enabled telemedicine application and verified that I am speaking with the correct person using two identifiers.  Location: Patient: Home Provider: [facility] office   I discussed the limitations of evaluation and management by telemedicine and the availability of in person appointments. The patient expressed understanding and agreed to proceed.    Kathy Zimmerman Doi is a 34 year old female who presents for weight management consultation.  She has tried Wegovy  and Zepbound , with better response to Zepbound , but she cannot afford it without insurance, and she notes weight loss medications usually require higher doses to work for her. Medicaid coverage for weight loss drugs has been inconsistent, and she has not used any since coverage resumed in December. Her current weight is 173 lb at 5 ft 6 in, with BMI 27.9.  She is exercising daily for 30-60 minutes with walking She has been maintaining a 500 cal deficit for weight loss  She has hld, likely related to her overweight status  Problems:  Patient Active Problem List   Diagnosis Date Noted   Anaphylactic reaction due to food, subsequent encounter 11/15/2022   Other allergic rhinitis 11/15/2022   Mixed bipolar affective disorder, moderate (HCC) 12/11/2020   Asthma 04/19/2020   Depression 04/19/2020   PTSD (post-traumatic stress disorder) 08/15/2018   GAD (generalized anxiety disorder) 10/25/2017   Recurrent vertigo 07/12/2017   POTS (postural orthostatic tachycardia syndrome) 09/11/2016   Hx of severe preeclampsia 03/20/2016   Anxiety 04/11/2015   Retinal detachment of left eye with retinal break 08/08/2011    Class: Acute    Allergies: Allergies[1] Medications: Current Medications[2]  Observations/Objective: Patient is well-developed, well-nourished in no acute  distress.  Resting comfortably  at home.  Head is normocephalic, atraumatic.  No labored breathing.  Speech is clear and coherent with logical content.  Patient is alert and oriented at baseline.   Assessment and Plan    Overweight; Mixed hyperlipidemia BMI 27.9. Ongoing elevated Low Density Lipoprotein (LDL cholesterol). Feel as though she would benefit from glp-1 medication. - Start Wegovy  0.5 mg weekly - She is aware of risks and benefits/side effect(s)  - follow up in 3 month(s), sooner if concerns    She is aware that this is not safe for pregnancy, reports there is no current pregnancy concerns    Follow Up Instructions: I discussed the assessment and treatment plan with the patient. The patient was provided an opportunity to ask questions and all were answered. The patient agreed with the plan and demonstrated an understanding of the instructions.  A copy of instructions were sent to the patient via MyChart unless otherwise noted below.   The patient was advised to call back or seek an in-person evaluation if the symptoms worsen or if the condition fails to improve as anticipated.  Kathy Buttner, PA    [1]  Allergies Allergen Reactions   Cat Dander Anaphylaxis   Shrimp [Shellfish Allergy ] Anaphylaxis and Swelling   Sulfasalazine Rash   Sulfa Antibiotics Rash  [2]  Current Outpatient Medications:    semaglutide -weight management (WEGOVY ) 0.5 MG/0.5ML SOAJ SQ injection, Inject 0.5 mg into the skin once a week., Disp: 2 mL, Rfl: 1   blood glucose meter kit and supplies KIT, Dispense based on patient and insurance preference. Use up to four times daily as directed., Disp: 1  each, Rfl: 0   busPIRone (BUSPAR) 10 MG tablet, Take 10 mg by mouth in the morning and at bedtime., Disp: , Rfl:    chlorproMAZINE (THORAZINE) 100 MG tablet, Take 100 mg by mouth at bedtime., Disp: , Rfl:    cyclobenzaprine  (FLEXERIL ) 5 MG tablet, Take 1-2 tablets (5-10 mg total) by mouth 3 (three)  times daily as needed for muscle spasms., Disp: 30 tablet, Rfl: 1   diphenhydrAMINE  (BENADRYL ) 50 MG tablet, Take 50 mg by mouth at bedtime as needed., Disp: , Rfl:    EPINEPHrine  0.3 mg/0.3 mL IJ SOAJ injection, Inject 0.3 mg into the muscle as needed for anaphylaxis., Disp: 2 each, Rfl: 1   gabapentin (NEURONTIN) 600 MG tablet, Take 1,800 mg by mouth daily., Disp: , Rfl:    lithium  600 MG capsule, Take 600 mg by mouth at bedtime., Disp: , Rfl:    medroxyPROGESTERone  (DEPO-PROVERA ) 150 MG/ML injection, Inject 1 mL (150 mg total) into the muscle every 3 (three) months., Disp: 1 mL, Rfl: 0   megestrol  (MEGACE ) 40 MG tablet, TAKE 3 TABS X 5 DAYS, 2 TABS X 5 DAYS, 1 TAB DAILY TO CONTROL VAG BLEEDING, STOP WHEN BLEEDING STOPS, (Patient taking differently: as needed. TAKE 3 TABS X 5 DAYS, 2 TABS X 5 DAYS, 1 TAB DAILY TO CONTROL VAG BLEEDING, STOP WHEN BLEEDING STOPS,), Disp: 45 tablet, Rfl: 0   Needles & Syringes MISC, 1 Syringe by Does not apply route once a week., Disp: 1 Box, Rfl: 0   omeprazole  (PRILOSEC) 40 MG capsule, TAKE 1 CAPSULE (40 MG TOTAL) BY MOUTH DAILY., Disp: 90 capsule, Rfl: 1   ondansetron  (ZOFRAN ) 8 MG tablet, Take 1 tablet (8 mg total) by mouth every 8 (eight) hours as needed for nausea or vomiting., Disp: 20 tablet, Rfl: 0   rifaximin  (XIFAXAN ) 550 MG TABS tablet, Take 1 tablet (550 mg total) by mouth daily in the afternoon., Disp: 9 tablet, Rfl: 0   Sodium Sulfate-Mag Sulfate-KCl (SUTAB ) [phone] MG TABS, Take 24 tablets by mouth as directed. For colonoscopy, Disp: 24 tablet, Rfl: 0   tretinoin  (RETIN-A ) 0.025 % cream, Apply 1 Application topically at bedtime., Disp: 45 g, Rfl: 0   venlafaxine XR (EFFEXOR-XR) 150 MG 24 hr capsule, Take 150 mg by mouth daily., Disp: , Rfl:    venlafaxine XR (EFFEXOR-XR) 75 MG 24 hr capsule, Take by mouth., Disp: , Rfl:   "

## 2024-09-25 NOTE — Telephone Encounter (Signed)
 Please do PA for Wegovy  0.5 mg. Thanks

## 2024-10-01 ENCOUNTER — Encounter: Payer: Self-pay | Admitting: Physician Assistant

## 2024-10-01 NOTE — Telephone Encounter (Signed)
 Pt is inquiring about PA for Wegovy . Has it been approved yet?

## 2024-10-06 ENCOUNTER — Other Ambulatory Visit (HOSPITAL_COMMUNITY): Payer: Self-pay

## 2024-10-06 ENCOUNTER — Telehealth: Payer: Self-pay

## 2024-10-06 NOTE — Telephone Encounter (Signed)
 Pharmacy Patient Advocate Encounter   Received notification from Pt Calls Messages that prior authorization for Wegovy  0.5mg /0.70ml is required/requested.   Insurance verification completed.   The patient is insured through [facility] MEDICAID.   Per test claim: PA required; PA submitted to above mentioned insurance via Fax Key/confirmation #/EOC * Status is pending   Fax# [phone] Phone# [phone]

## 2024-10-07 ENCOUNTER — Other Ambulatory Visit (HOSPITAL_COMMUNITY): Payer: Self-pay

## 2024-10-08 ENCOUNTER — Other Ambulatory Visit (HOSPITAL_COMMUNITY): Payer: Self-pay

## 2024-10-15 ENCOUNTER — Other Ambulatory Visit (HOSPITAL_COMMUNITY): Payer: Self-pay

## 2024-10-15 NOTE — Telephone Encounter (Signed)
 Pharmacy Patient Advocate Encounter  Received notification from [place] [employer]ICA[employer] that Prior Authorization for Wegovy  0.5mg /0.23ml has been DENIED.  See denial reason below. No denial letter attached in CMM. Will attach denial letter to Media tab once received.   PA #/Case ID/Reference #: [id number]  Per the representative, the PA was denied due to the type of medicaid plan the paitent has and medicaid will not pay for meds by itself.   Reference# [id number]

## 2024-10-15 NOTE — Telephone Encounter (Signed)
 Hello, is the PA for Wegovy  back yet?

## 2024-10-15 NOTE — Telephone Encounter (Signed)
 See other message

## 2024-10-21 ENCOUNTER — Ambulatory Visit: Admitting: Neurology

## 2024-12-08 ENCOUNTER — Ambulatory Visit: Admitting: Neurology

## 2024-12-23 ENCOUNTER — Ambulatory Visit
